# Patient Record
Sex: Female | Born: 1950 | Race: White | Hispanic: No | Marital: Married | Smoking: Never smoker
Health system: Southern US, Community
[De-identification: ages and names within clinical notes are randomized; demographics above are authoritative.]

## PROBLEM LIST (undated history)

## (undated) DIAGNOSIS — S161XXA Strain of muscle, fascia and tendon at neck level, initial encounter: Secondary | ICD-10-CM

## (undated) DIAGNOSIS — F419 Anxiety disorder, unspecified: Secondary | ICD-10-CM

## (undated) DIAGNOSIS — M47812 Spondylosis without myelopathy or radiculopathy, cervical region: Secondary | ICD-10-CM

## (undated) DIAGNOSIS — E785 Hyperlipidemia, unspecified: Secondary | ICD-10-CM

## (undated) DIAGNOSIS — E039 Hypothyroidism, unspecified: Secondary | ICD-10-CM

## (undated) DIAGNOSIS — I7300 Raynaud's syndrome without gangrene: Secondary | ICD-10-CM

## (undated) DIAGNOSIS — J45909 Unspecified asthma, uncomplicated: Secondary | ICD-10-CM

## (undated) DIAGNOSIS — K219 Gastro-esophageal reflux disease without esophagitis: Secondary | ICD-10-CM

## (undated) DIAGNOSIS — L309 Dermatitis, unspecified: Secondary | ICD-10-CM

## (undated) DIAGNOSIS — E041 Nontoxic single thyroid nodule: Secondary | ICD-10-CM

## (undated) DIAGNOSIS — Z8669 Personal history of other diseases of the nervous system and sense organs: Secondary | ICD-10-CM

## (undated) DIAGNOSIS — T4145XA Adverse effect of unspecified anesthetic, initial encounter: Secondary | ICD-10-CM

## (undated) DIAGNOSIS — G610 Guillain-Barre syndrome: Secondary | ICD-10-CM

## (undated) DIAGNOSIS — T7840XA Allergy, unspecified, initial encounter: Secondary | ICD-10-CM

## (undated) DIAGNOSIS — T8859XA Other complications of anesthesia, initial encounter: Secondary | ICD-10-CM

## (undated) DIAGNOSIS — I10 Essential (primary) hypertension: Secondary | ICD-10-CM

## (undated) DIAGNOSIS — M47817 Spondylosis without myelopathy or radiculopathy, lumbosacral region: Secondary | ICD-10-CM

## (undated) DIAGNOSIS — M1990 Unspecified osteoarthritis, unspecified site: Secondary | ICD-10-CM

## (undated) HISTORY — PX: ABDOMINAL HYSTERECTOMY: SHX81

## (undated) HISTORY — PX: KNEE ARTHROSCOPY: SUR90

## (undated) HISTORY — DX: Hyperlipidemia, unspecified: E78.5

## (undated) HISTORY — DX: Hypothyroidism, unspecified: E03.9

## (undated) HISTORY — DX: Allergy, unspecified, initial encounter: T78.40XA

## (undated) HISTORY — DX: Spondylosis without myelopathy or radiculopathy, lumbosacral region: M47.817

## (undated) HISTORY — DX: Spondylosis without myelopathy or radiculopathy, cervical region: M47.812

## (undated) HISTORY — PX: STRABISMUS SURGERY: SHX218

## (undated) HISTORY — PX: HERNIA REPAIR: SHX51

## (undated) HISTORY — PX: FOOT SURGERY: SHX648

## (undated) HISTORY — PX: REPLACEMENT TOTAL KNEE: SUR1224

## (undated) HISTORY — DX: Raynaud's syndrome without gangrene: I73.00

## (undated) HISTORY — PX: COLONOSCOPY: SHX174

## (undated) HISTORY — DX: Nontoxic single thyroid nodule: E04.1

## (undated) HISTORY — DX: Gastro-esophageal reflux disease without esophagitis: K21.9

## (undated) HISTORY — DX: Strain of muscle, fascia and tendon at neck level, initial encounter: S16.1XXA

## (undated) HISTORY — DX: Essential (primary) hypertension: I10

## (undated) HISTORY — PX: REPLACEMENT TOTAL KNEE BILATERAL: SUR1225

## (undated) HISTORY — DX: Unspecified osteoarthritis, unspecified site: M19.90

## (undated) HISTORY — DX: Personal history of other diseases of the nervous system and sense organs: Z86.69

## (undated) HISTORY — PX: TONSILLECTOMY: SUR1361

## (undated) HISTORY — PX: GANGLION CYST EXCISION: SHX1691

## (undated) HISTORY — DX: Guillain-Barre syndrome: G61.0

---

## 1998-10-28 ENCOUNTER — Other Ambulatory Visit: Admission: RE | Admit: 1998-10-28 | Discharge: 1998-10-28 | Payer: Self-pay | Admitting: Obstetrics and Gynecology

## 1999-07-25 ENCOUNTER — Ambulatory Visit (HOSPITAL_COMMUNITY): Admission: RE | Admit: 1999-07-25 | Discharge: 1999-07-25 | Payer: Self-pay | Admitting: Internal Medicine

## 1999-11-23 ENCOUNTER — Other Ambulatory Visit: Admission: RE | Admit: 1999-11-23 | Discharge: 1999-11-23 | Payer: Self-pay | Admitting: Obstetrics and Gynecology

## 2000-12-31 ENCOUNTER — Other Ambulatory Visit: Admission: RE | Admit: 2000-12-31 | Discharge: 2000-12-31 | Payer: Self-pay | Admitting: Obstetrics and Gynecology

## 2002-01-20 ENCOUNTER — Other Ambulatory Visit: Admission: RE | Admit: 2002-01-20 | Discharge: 2002-01-20 | Payer: Self-pay | Admitting: Obstetrics and Gynecology

## 2003-02-10 ENCOUNTER — Other Ambulatory Visit: Admission: RE | Admit: 2003-02-10 | Discharge: 2003-02-10 | Payer: Self-pay | Admitting: Obstetrics and Gynecology

## 2004-12-28 ENCOUNTER — Ambulatory Visit (HOSPITAL_COMMUNITY): Admission: RE | Admit: 2004-12-28 | Discharge: 2004-12-28 | Payer: Self-pay | Admitting: Gastroenterology

## 2005-03-14 ENCOUNTER — Other Ambulatory Visit: Admission: RE | Admit: 2005-03-14 | Discharge: 2005-03-14 | Payer: Self-pay | Admitting: Obstetrics and Gynecology

## 2007-06-20 ENCOUNTER — Ambulatory Visit (HOSPITAL_COMMUNITY): Admission: RE | Admit: 2007-06-20 | Discharge: 2007-06-20 | Payer: Self-pay | Admitting: Rheumatology

## 2007-06-25 ENCOUNTER — Encounter: Admission: RE | Admit: 2007-06-25 | Discharge: 2007-06-25 | Payer: Self-pay | Admitting: Rheumatology

## 2007-11-25 ENCOUNTER — Encounter: Admission: RE | Admit: 2007-11-25 | Discharge: 2007-11-25 | Payer: Self-pay | Admitting: Endocrinology

## 2008-05-14 ENCOUNTER — Encounter: Admission: RE | Admit: 2008-05-14 | Discharge: 2008-05-14 | Payer: Self-pay | Admitting: Endocrinology

## 2008-08-17 ENCOUNTER — Inpatient Hospital Stay (HOSPITAL_COMMUNITY): Admission: RE | Admit: 2008-08-17 | Discharge: 2008-08-20 | Payer: Self-pay | Admitting: Orthopedic Surgery

## 2008-10-26 ENCOUNTER — Ambulatory Visit (HOSPITAL_COMMUNITY): Admission: RE | Admit: 2008-10-26 | Discharge: 2008-10-26 | Payer: Self-pay | Admitting: Orthopedic Surgery

## 2008-11-11 ENCOUNTER — Encounter: Admission: RE | Admit: 2008-11-11 | Discharge: 2008-11-11 | Payer: Self-pay | Admitting: Endocrinology

## 2010-01-12 ENCOUNTER — Inpatient Hospital Stay (HOSPITAL_COMMUNITY): Admission: EM | Admit: 2010-01-12 | Discharge: 2010-01-18 | Payer: Self-pay | Admitting: Emergency Medicine

## 2010-01-14 ENCOUNTER — Encounter (INDEPENDENT_AMBULATORY_CARE_PROVIDER_SITE_OTHER): Payer: Self-pay | Admitting: Neurology

## 2010-01-17 ENCOUNTER — Ambulatory Visit: Payer: Self-pay | Admitting: Physical Medicine & Rehabilitation

## 2010-01-18 ENCOUNTER — Inpatient Hospital Stay (HOSPITAL_COMMUNITY)
Admission: RE | Admit: 2010-01-18 | Discharge: 2010-01-29 | Payer: Self-pay | Admitting: Physical Medicine & Rehabilitation

## 2010-01-22 ENCOUNTER — Ambulatory Visit: Payer: Self-pay | Admitting: Physical Medicine & Rehabilitation

## 2010-03-03 ENCOUNTER — Encounter
Admission: RE | Admit: 2010-03-03 | Discharge: 2010-06-01 | Payer: Self-pay | Admitting: Physical Medicine & Rehabilitation

## 2010-03-04 ENCOUNTER — Ambulatory Visit: Payer: Self-pay | Admitting: Physical Medicine & Rehabilitation

## 2010-03-15 ENCOUNTER — Encounter
Admission: RE | Admit: 2010-03-15 | Discharge: 2010-05-23 | Payer: Self-pay | Admitting: Physical Medicine & Rehabilitation

## 2010-04-19 ENCOUNTER — Ambulatory Visit: Payer: Self-pay | Admitting: Physical Medicine & Rehabilitation

## 2010-07-06 ENCOUNTER — Encounter
Admission: RE | Admit: 2010-07-06 | Discharge: 2010-07-12 | Payer: Self-pay | Source: Home / Self Care | Attending: Physical Medicine & Rehabilitation | Admitting: Physical Medicine & Rehabilitation

## 2010-07-12 ENCOUNTER — Ambulatory Visit: Payer: Self-pay | Admitting: Physical Medicine & Rehabilitation

## 2010-08-11 ENCOUNTER — Encounter: Admission: RE | Admit: 2010-08-11 | Discharge: 2010-08-11 | Payer: Self-pay | Admitting: Endocrinology

## 2010-10-03 ENCOUNTER — Encounter
Admission: RE | Admit: 2010-10-03 | Discharge: 2010-10-11 | Payer: Self-pay | Source: Home / Self Care | Attending: Physical Medicine & Rehabilitation | Admitting: Physical Medicine & Rehabilitation

## 2010-10-11 ENCOUNTER — Ambulatory Visit: Payer: Self-pay | Admitting: Physical Medicine & Rehabilitation

## 2010-11-22 ENCOUNTER — Other Ambulatory Visit: Payer: Self-pay | Admitting: Dermatology

## 2011-01-04 LAB — URINALYSIS, ROUTINE W REFLEX MICROSCOPIC
Bilirubin Urine: NEGATIVE
Ketones, ur: NEGATIVE mg/dL
Specific Gravity, Urine: 1.025 (ref 1.005–1.030)
Urobilinogen, UA: 0.2 mg/dL (ref 0.0–1.0)

## 2011-01-04 LAB — PROTEIN AND GLUCOSE, CSF: Glucose, CSF: 67 mg/dL (ref 43–76)

## 2011-01-04 LAB — COMPREHENSIVE METABOLIC PANEL
Albumin: 3 g/dL — ABNORMAL LOW (ref 3.5–5.2)
BUN: 22 mg/dL (ref 6–23)
Creatinine, Ser: 0.56 mg/dL (ref 0.4–1.2)
GFR calc Af Amer: >60 mL/min (ref >60)
Total Protein: 7.5 g/dL (ref 6.0–8.3)

## 2011-01-04 LAB — ANGIOTENSIN CONVERTING ENZYME, CSF: Angio Convert Enzyme: <3 U/L (ref <16)

## 2011-01-04 LAB — CBC
HCT: 40.1 % (ref 36.0–46.0)
HCT: 42.4 % (ref 36.0–46.0)
HCT: 43.2 % (ref 36.0–46.0)
Hemoglobin: 14.4 g/dL (ref 12.0–15.0)
MCHC: 34.2 g/dL (ref 30.0–36.0)
MCV: 97 fL (ref 78.0–100.0)
MCV: 97.3 fL (ref 78.0–100.0)
MCV: 99.3 fL (ref 78.0–100.0)
Platelets: 121 10*3/uL — ABNORMAL LOW (ref 150–400)
Platelets: 131 10*3/uL — ABNORMAL LOW (ref 150–400)
Platelets: 147 10*3/uL — ABNORMAL LOW (ref 150–400)
RDW: 13.3 % (ref 11.5–15.5)
RDW: 14.2 % (ref 11.5–15.5)
WBC: 3.8 10*3/uL — ABNORMAL LOW (ref 4.0–10.5)

## 2011-01-04 LAB — DIFFERENTIAL
Eosinophils Absolute: 0.1 10*3/uL (ref 0.0–0.7)
Eosinophils Relative: 3 % (ref 0–5)
Lymphocytes Relative: 39 % (ref 12–46)
Lymphs Abs: 1.3 10*3/uL (ref 0.7–4.0)
Lymphs Abs: 2 10*3/uL (ref 0.7–4.0)
Monocytes Absolute: 0.5 10*3/uL (ref 0.1–1.0)
Monocytes Relative: 14 % — ABNORMAL HIGH (ref 3–12)
Neutro Abs: 1.5 10*3/uL — ABNORMAL LOW (ref 1.7–7.7)

## 2011-01-04 LAB — BASIC METABOLIC PANEL
BUN: 21 mg/dL (ref 6–23)
CO2: 28 mEq/L (ref 19–32)
CO2: 31 mEq/L (ref 19–32)
Calcium: 9.4 mg/dL (ref 8.4–10.5)
Chloride: 102 mEq/L (ref 96–112)
Creatinine, Ser: 0.58 mg/dL (ref 0.4–1.2)
GFR calc Af Amer: >60 mL/min (ref >60)
GFR calc non Af Amer: >60 mL/min (ref >60)
Glucose, Bld: 112 mg/dL — ABNORMAL HIGH (ref 70–99)
Glucose, Bld: 136 mg/dL — ABNORMAL HIGH (ref 70–99)
Potassium: 4.1 mEq/L (ref 3.5–5.1)
Sodium: 135 mEq/L (ref 135–145)

## 2011-01-04 LAB — URINE MICROSCOPIC-ADD ON

## 2011-01-04 LAB — CSF CELL COUNT WITH DIFFERENTIAL
RBC Count, CSF: 2 /mm3 — ABNORMAL HIGH
WBC, CSF: 2 /mm3 (ref 0–5)

## 2011-01-04 LAB — URINE CULTURE: Colony Count: >=100000

## 2011-01-04 LAB — EPSTEIN-BARR VIRUS VCA ANTIBODY PANEL: EBV EA IgG: 2.42 {ISR} — ABNORMAL HIGH

## 2011-01-04 LAB — HEPATIC FUNCTION PANEL
Bilirubin, Direct: 0.2 mg/dL (ref 0.0–0.3)
Total Bilirubin: 0.5 mg/dL (ref 0.3–1.2)

## 2011-01-09 LAB — CBC
HCT: 45.5 % (ref 36.0–46.0)
Hemoglobin: 15.4 g/dL — ABNORMAL HIGH (ref 12.0–15.0)
MCHC: 33.8 g/dL (ref 30.0–36.0)
RDW: 14 % (ref 11.5–15.5)

## 2011-01-09 LAB — HIV ANTIBODY (ROUTINE TESTING W REFLEX): HIV: NONREACTIVE

## 2011-01-09 LAB — DIFFERENTIAL
Basophils Absolute: 0.1 10*3/uL (ref 0.0–0.1)
Eosinophils Absolute: 0.1 10*3/uL (ref 0.0–0.7)
Lymphocytes Relative: 37 % (ref 12–46)
Lymphs Abs: 2.3 10*3/uL (ref 0.7–4.0)
Neutrophils Relative %: 47 % (ref 43–77)

## 2011-01-09 LAB — SEDIMENTATION RATE: Sed Rate: 2 mm/hr (ref 0–22)

## 2011-01-09 LAB — HEAVY METALS SCREEN, URINE
Mercury, 24H Ur: <2 mcg/L (ref <21)
Time-HMSU: 24 hours

## 2011-01-09 LAB — COMPREHENSIVE METABOLIC PANEL
ALT: 79 U/L — ABNORMAL HIGH (ref 0–35)
CO2: 27 mEq/L (ref 19–32)
Calcium: 9.8 mg/dL (ref 8.4–10.5)
Creatinine, Ser: 0.71 mg/dL (ref 0.4–1.2)
GFR calc non Af Amer: >60 mL/min (ref >60)
Glucose, Bld: 109 mg/dL — ABNORMAL HIGH (ref 70–99)

## 2011-01-09 LAB — APTT: aPTT: 35 seconds (ref 24–37)

## 2011-01-30 LAB — HEMOGLOBIN AND HEMATOCRIT, BLOOD: Hemoglobin: 14.6 g/dL (ref 12.0–15.0)

## 2011-02-28 NOTE — Op Note (Signed)
NAMETAELER, WINNING            ACCOUNT NO.:  [id number]   MEDICAL RECORD NO.:  [id number]          PATIENT TYPE:  AMB   LOCATION:  DAY                          FACILITY:  [facility]   PHYSICIAN:  Ollen Gross, M.D.    DATE OF BIRTH:  Apr 26, 1951   DATE OF PROCEDURE:  10/26/2008  DATE OF DISCHARGE:                               OPERATIVE REPORT   PREOPERATIVE DIAGNOSIS:  Right knee arthrofibrosis.   POSTOPERATIVE DIAGNOSIS:  Right knee arthrofibrosis.   PROCEDURE:  Right knee closed manipulation.   SURGEON:  Ollen Gross, M.D.   ANESTHESIA:  General.   Pre-manipulation range of motion, 15- 90.  Post-manipulation range of  motion 5-135.   COMPLICATIONS:  None.   CONDITION:  Stable to recovery.   BRIEF CLINICAL NOTE:  Brianna Gates is a 60 year old female who had right  total knee arthroplasty performed approximately 8 weeks ago.  She has  not progressed well with therapy for the past month.  She is stuck at 90  degrees of flexion and has a 15-degree flexion contracture.  She  presents now for closed manipulation.   PROCEDURE IN DETAIL:  After successful administration of general  anesthetic, exam under anesthesia was performed, and range of motion is  15-90.  I then placed my chest on her proximal tibia, flexing the knee  with audible lysis of adhesions.  I was easily able to get her to 135  degrees of flexion.  I then manipulated extension and got it to within 5  degrees of full extension.  I mobilized the patella and the patella was  moving better.   She is subsequently awakened, and transferred to the recovery room in  stable condition.      Ollen Gross, M.D.  Electronically Signed     FA/MEDQ  D:  10/26/2008  T:  10/26/2008  Job:  [id number]

## 2011-02-28 NOTE — Op Note (Signed)
Brianna Gates, Brianna Gates            ACCOUNT NO.:  [id number]   MEDICAL RECORD NO.:  [id number]          PATIENT TYPE:  INP   LOCATION:  NA                           FACILITY:  [facility]   PHYSICIAN:  Ollen Gross, M.D.    DATE OF BIRTH:  02/07/1951   DATE OF PROCEDURE:  08/17/2008  DATE OF DISCHARGE:                               OPERATIVE REPORT   PREOPERATIVE DIAGNOSIS:  Osteoarthritis, right knee.   POSTOPERATIVE DIAGNOSIS:  Osteoarthritis, right knee.   PROCEDURE:  Right total knee arthroplasty.   ASSISTANT:  Arlyn Leak, PA-C   ANESTHESIA:  Spinal with Duramorph.   ESTIMATED BLOOD LOSS:  Minimal.   DRAINS:  None.   TOURNIQUET TIME:  32 minutes at 300 mmHg.   COMPLICATIONS:  None.   CONDITION:  Stable to recovery.   CLINICAL NOTE:  Ms. Axe is a 60 year old female with end-stage  patellofemoral arthritis of the right knee who has had progressively  worsening pain and dysfunction.  She has failed nonoperative management  and presents now for a right total knee arthroplasty.   PROCEDURE IN DETAIL:  After successful administration of spinal  anesthetic, a tourniquet was placed high on her right thigh and right  lower extremity was prepped and draped in the usual sterile fashion.  The extremity was wrapped in Esmarch, knee flexed and tourniquet  inflated to 300 mmHg.   A midline incision was made with a 10 blade through subcutaneous tissue  to the level of the extensor mechanism.  A fresh blade was used to make  a medial parapatellar arthrotomy.  The soft tissue over the proximal  medial tibia subperiosteally elevated to the joint line with the knife  and into the semimembranosus bursa with a Cobb elevator.  The soft  tissue laterally was elevated with attention being paid to avoid  patellar tendon on tibial tubercle.  The patella subluxed laterally.  The knee flexed 90 degrees.  The ACL and PCL were removed.  A drill was  used to create a starting hole in the distal  femur and the canal was  thoroughly irrigated.  The 5 degree right valgus alignment guide was  placed and referencing off the posterior condyle, rotations marked and  the block pinned to remove 11 mm of the distal femur.  I took 11 because  of preop flexion contracture.  The distal femoral resection was made  with an oscillating saw.  Sizing blocks were placed and size 2.5 was the  most appropriate.  The rotation was marked off the epicondylar axis and  size 2.5 cutting block was placed.  The anterior-posterior chamfer cuts  were made.   The tibia was subluxed forward and menisci removed.  An extramedullary  tibial alignment guide was placed, referencing proximally at the medial  aspect of the tibial tubercle and distally along the second metatarsal  axis and tibial crest.  The block was pinned to remove 10 mm off the  nondeficient lateral side.  Tibial resection was made with an  oscillating saw.  A size 2.5 was the most appropriate tibial component  and the proximal tibia was repaired  with a modular drill and keel punch  for a size 2.5.  Femoral preparation was completed with the  intercondylar cut.   A size 2.5 mobile bearing tibial trial, 2.5 posterior stabilized femoral  trial and a 10 mm posterior stabilized rotating platform insert trial  were placed.  The 10 full extensions achieved with excellent varus-  valgus and anterior and posterior balance throughout the full range of  motion.  The patella was everted and thickness measured to be 20 mm.  A  freehand resection was taken at 12 mm, 35 template was placed, lug holes  were drilled, trial patella was placed and it tracked normally.  Osteophytes were removed off the posterior femur with the trial in  place.  All trials were removed and the cut bone surfaces were prepared  with pulsatile lavage.  Cement was mixed and once ready for  implantation, the size 2.5 mobile bearing tibia, 2.5 posterior  stabilized femur and 35 patella  were cemented into place and the patella  was held with a clamp.  A trial 10-mm insert was placed, knee held in  full extension and all extruded cement removed.  When the cement was  fully hardened, then the permanent 10 mm posterior stabilized rotating  platform insert was placed in the tibial tray.   The wound was copiously irrigated with saline solution.  FloSeal was  then injected on the posterior capsule, mediolateral gutters and  suprapatellar area.  A moist sponge was placed and tourniquet released  for a total time of 32 minutes.  The sponge was held for 2 minutes and  then removed.  Minimal bleeding was encountered.  The bleeding that was  encountered was stopped with electrocautery.  The wound was again  irrigated with saline solution and the arthrotomy closed with  interrupted #1 PDS.  Flexion against gravity is 135 degrees.  The  subcutaneous was closed with interrupted 2-0 Vicryl and subcuticular  running 4-0 Monocryl.  The incision was cleaned and dried and Steri-  Strips and a bulky sterile dressing applied.   She was then awakened and transported to recovery in stable condition.      Ollen Gross, M.D.  Electronically Signed     FA/MEDQ  D:  08/17/2008  T:  08/17/2008  Job:  [id number]

## 2011-03-03 NOTE — Op Note (Signed)
NAMESHANERA, MESKE            ACCOUNT NO.:  [id number]   MEDICAL RECORD NO.:  [id number]          PATIENT TYPE:  AMB   LOCATION:  ENDO                         FACILITY:  [facility]   PHYSICIAN:  Danise Edge, M.D.   DATE OF BIRTH:  [birth year]   DATE OF PROCEDURE:  12/28/2004  DATE OF DISCHARGE:                                 OPERATIVE REPORT   PROCEDURE:  Screening colonoscopy.   PROCEDURE INDICATION:  Ms. Brianna Gates is a 60 year old female born  April 25, 1951.  Ms. Brianna Gates is scheduled to undergo her first screening  colonoscopy with polypectomy to prevent colon cancer.   ENDOSCOPIST:  Danise Edge, M.D.   PREMEDICATION:  Versed 6 mg, Demerol 60 mg.   PROCEDURE:  After obtaining informed consent, Ms. Brianna Gates was placed in the  left lateral decubitus position.  I administered intravenous Demerol and  intravenous Versed to achieve conscious sedation for the procedure.  The  patient's blood pressure, oxygen saturation and cardiac rhythm were  monitored throughout the procedure and documented in the medical record.   Anal inspection and digital rectal exam were normal.  The Olympus adjustable  pediatric colonoscope was introduced into the rectum and advanced to the  cecum.  Colonic preparation for the exam today was excellent.   Rectum normal.  Sigmoid colon and descending colon normal.  Splenic flexure normal.  Transverse colon normal.  Hepatic flexure normal.  Ascending colon normal.  Cecum and ileocecal valve normal.  Distal ileum normal.   ASSESSMENT:  Normal screening proctocolonoscopy.  No endoscopic evidence for  the presence of colorectal neoplasia.      MJ/MEDQ  D:  12/28/2004  T:  12/28/2004  Job:  [id number]   cc:   Georgann Housekeeper, MD  [address]  [place]  [place] [address]  Fax: [phone]

## 2011-03-03 NOTE — H&P (Signed)
Brianna Gates, KNUPP            ACCOUNT NO.:  [id number]   MEDICAL RECORD NO.:  [id number]          PATIENT TYPE:  INP   LOCATION:                               FACILITY:  [facility]   PHYSICIAN:  Ollen Gross, M.D.    DATE OF BIRTH:  1951-07-05   DATE OF ADMISSION:  08/17/2008  DATE OF DISCHARGE:                              HISTORY & PHYSICAL   CHIEF COMPLAINT:  Bilateral knee pain.   HISTORY OF PRESENT ILLNESS:  The patient is a 60 year old female with  bilateral knee pain, right is significantly worse than left.  She has  pain with ambulation and range of motion.  The patient has failed  conservative treatment.  The x-rays revealed significant arthritic  changes in her right knee.  The patient has elected to proceed with a  right total knee arthroplasty.   ALLERGIES:  DAYPRO.   CURRENT MEDICATIONS:  1. Synthroid 50 mcg a day.  2. Zyrtec 10 mg a day.  3. Zocor 80 mg a day.  4. Aspirin 81 mg 3 times a week.  5. Amlodipine 5 mg a day.  6. Cozaar 50 mg p.o. q. every other day.  7. EstroGel once a day.  8. Allergy shots with [facility].  9. Multivitamins.  10.Synthroid 50 mcg a day.  11.Hydrochlorothiazide 12.5 mg p.r.n.  12.Celebrex 200 mg once a day.   PAST MEDICAL HISTORY:  1. Hypertension.  2. Hypercholesterolemia.  3. Raynaud's syndrome.  4. Thyroid disease.  5. History of allergic rhinosinusitis.  6. Chronic lower back and cervical pain with lumbar and cervical disk      disease.  7. History of microscopic hematuria since her early 89s.   REVIEW OF SYSTEMS:  Negative for any neurologic issues other than  history of strabismus with eye surgery.  PULMONARY:  She has had  bronchitis and pneumonia in the past but nothing recently.  CARDIOVASCULAR:  Unremarkable other than some hypertension and  cholesterol which seems to be well controlled.  Last stress test was 4  years previous by Dr. Tresa Endo.  GI is unremarkable.  GU is unremarkable.  Endocrine is positive for  thyroid nodules on current thyroid  medications.  Hematologic is unremarkable.   PAST SURGICAL HISTORY:  1. Partial hysterectomy.  2. Tonsillectomy.  3. Adenoidectomy.  4. Eye surgery for strabismus.  5. Right knee arthroscopy.  Only complication was nausea and vomiting postop.   FAMILY MEDICAL HISTORY:  Father is deceased at the age 8 from a heart  attack.  Mother is alive at 19 with hypertension.   SOCIAL HISTORY:  The patient is married, never smoked, has a couple of  alcoholic beverages a week.  Has 2 grown children and lives in a single  family home.   PHYSICAL EXAM:  VITALS:  Height is five 5 feet, 4-1/2 inches tall,  weight is 128 and blood pressure is 130/72, pulse is 68 and regular,  respirations of 12, patient is afebrile.  GENERAL:  This is a healthy-appearing female, conscious, alert and  appropriate, appears to be a good historian.  Appears to be in no  extreme  distress.  Ambulates with a fairly easy balanced gait.  HEENT: Head was normocephalic.  Pupils equal, round, react.  Gross  hearing is intact.  NECK:  Supple.  No palpable lymphadenopathy.  Good range of motion.  CHEST:  Lung sounds were clear and equal bilaterally.  HEART:  Regular rate and rhythm.  No murmurs, rubs or gallops.  ABDOMEN:  Soft, nontender.  Bowel sounds present.  EXTREMITIES:  Upper extremities had good range of motion of shoulders,  elbows and wrists.  LOWER EXTREMITIES:  Both hips had full extension, flexion up to 120  degrees, 30 degrees internal-external rotation.  With both knees, very  gingerly, she was able to fully extend them.  She had significant  crepitus on the patella.  She can flex them back to about 120 degrees.  She had no gross instability.  Calves were soft, good motion at the  ankles.  Peripheral vascular carotid pulses were 2+,  no bruits.  Radial pulses  2+ dorsalis pedis pulses 1+.  She had no lower extremity edema, venous  stasis changes or signs of phlebitis.   NEURO:  The patient was conscious, alert and appropriate, appears to be  a good historian.  No gross neurologic defects.  Breast, rectal and GU exams were deferred at this time.   IMPRESSION:  1. Osteoarthritis with painful knees bilaterally, right greater than      left.  2. Hypertension.  3. Hypercholesterolemia.  4. Raynaud's syndrome.  5. Thyroid disease  6. History of microscopic hematuria.  7. Lumbar and cervical pain with degenerative disk disease.  8. History of chronic allergic rhinitis.   PLAN:  The patient will undergo all routine labs and tests prior to  having a right total knee arthroplasty by Dr. Lequita Halt at [facility] on August 17, 2008.      Jamelle Rushing, P.A.      Ollen Gross, M.D.  Electronically Signed    RWK/MEDQ  D:  07/27/2008  T:  07/27/2008  Job:  [id number]   cc:   Ollen Gross, M.D.  Fax: [phone]

## 2011-03-03 NOTE — Discharge Summary (Signed)
NAMEBELLA, Brianna Gates            ACCOUNT NO.:  [id number]   MEDICAL RECORD NO.:  [id number]          PATIENT TYPE:  INP   LOCATION:  1614                         FACILITY:  [facility]   PHYSICIAN:  Ollen Gross, M.D.    DATE OF BIRTH:  01-29-1951   DATE OF ADMISSION:  08/17/2008  DATE OF DISCHARGE:  08/20/2008                               DISCHARGE SUMMARY   ADMITTING DIAGNOSES:  1. Osteoarthritis bilateral knees.  2. Hypertension.  3. Hypercholesterolemia.  4. Raynaud's syndrome.  5. Thyroid disease.  6. History of microscopic hematuria.  7. Lumbar and cervical degenerative disk disease with pain.  8. History of chronic allergic rhinitis.   DISCHARGE DIAGNOSES:  1. Osteoarthritis right knee, status post right total knee replacement      arthroplasty.  2. Osteoarthritis left knee.  3. Hypertension.  4. Hypercholesterolemia.  5. Raynaud's syndrome.  6. Thyroid disease.  7. History of microscopic hematuria.  8. Lumbar and cervical degenerative disk disease with pain.  9. History of chronic allergic rhinitis.   PROCEDURE:  July 17, 2008 - right total knee.  Surgeon - Dr. Lequita Halt.  Oneida Alar PA-C assisting.  Surgery done under spinal with Duramorph  added.   CONSULTATIONS:  None.   BRIEF HISTORY:  Brianna Gates is a 60 year old female with end-stage  patellofemoral arthritis of the right knee, progressive worsening pain  and dysfunction, who failed outpatient management and now presents for  total knee arthroplasty.   LABORATORY DATA:  Preoperative CBC - hemoglobin of 15.6, hematocrit of  46.7, white cell count 5.3, platelets 169.  Postoperative hemoglobin  13.5.  Last noted hemoglobin and hematocrit of 12.4 and 37.2.  PT/PTT  preoperatively 13.3 and 29 respectively.  INR 1.0.  Serial pro times  followed per Coumadin protocol.  Last noted PT/INR of 24.8 and 2.1.  Chem panel on admission all within normal limits.  Serial BMETs were  followed.  Electrolytes remained within  normal limits.  Preoperative UA  - trace leukocyte esterase, rare epithelials, 0-2 white cells.  Otherwise negative.  Blood group type A+.   ELECTROCARDIOGRAM:  EKG on August 17, 2008 - marked sinus bradycardia,  unconfirmed.   CHEST X-RAY:  Two-view chest on August 13, 2008 - no active  cardiopulmonary process.   HOSPITAL COURSE:  The patient was admitted to [facility] and  tolerated the procedure well.  Later transferred to the recovery room  and orthopedic floor.  Given 24 hours of postoperative IV antibiotics.  Started on Coumadin for DVT prophylaxis.  She did have a fair amount of  sickness and nausea postoperatively.  Was doing a little bit better by  the morning of day one.  Changed the PCA medication.  Output was low, so  we gave fluids.  Also fluids to supplement the nausea and vomiting.  Blood pressure was a little on the lower side, so the blood pressure  medications were restarted but with parameters in mind.  Again, fluid  boluses for the low pressure.  Electrolytes looked good.  Started  getting up out of bed by day #2, doing a little bit better.  Nausea had  resolved.  Electrolytes okay.  Output was much better.  Blood pressure  was still on the lower side, but she was asymptomatic with this.  Urine  output was good, so we just monitored the pressure.  Hemoglobin was  stable, though.  From a therapy standpoint, she started getting up  walking up and down the hallways.  She was meeting her goals, and by the  following day, postoperative day #3, she was walking greater than 50 to  75 feet, tolerating her medications and was discharged home.   DISCHARGE PLAN:  1. The patient was discharged home on August 20, 2008.  2. Discharge diagnoses - please see above.  3. Discharge medications - Vicodin, Robaxin, Coumadin.  4. Diet - low-sodium, heart-healthy, low-cholesterol diet.  5. Follow up in 2 weeks.  She is also to follow up with her medical      physician in  the next week or two for a blood pressure check      because her blood pressure medications were held on parameters.  6. Activity - Weightbearing as tolerated, total knee protocol.   DISPOSITION:  Home.   CONDITION ON DISCHARGE:  Improved.      Alexzandrew L. Perkins, P.A.C.      Ollen Gross, M.D.  Electronically Signed    ALP/MEDQ  D:  09/28/2008  T:  09/28/2008  Job:  [id number]

## 2011-03-29 ENCOUNTER — Ambulatory Visit: Payer: Self-pay | Admitting: Physical Medicine & Rehabilitation

## 2011-04-07 ENCOUNTER — Encounter: Payer: 59 | Attending: Physical Medicine & Rehabilitation | Admitting: Physical Medicine & Rehabilitation

## 2011-04-07 DIAGNOSIS — M1710 Unilateral primary osteoarthritis, unspecified knee: Secondary | ICD-10-CM

## 2011-04-07 DIAGNOSIS — G610 Guillain-Barre syndrome: Secondary | ICD-10-CM | POA: Insufficient documentation

## 2011-04-07 DIAGNOSIS — Z96659 Presence of unspecified artificial knee joint: Secondary | ICD-10-CM | POA: Insufficient documentation

## 2011-04-07 DIAGNOSIS — M545 Low back pain, unspecified: Secondary | ICD-10-CM

## 2011-04-07 DIAGNOSIS — R209 Unspecified disturbances of skin sensation: Secondary | ICD-10-CM | POA: Insufficient documentation

## 2011-04-08 NOTE — Assessment & Plan Note (Signed)
Brianna Gates is back regarding Guillain-Barre syndrome.  I last saw her in December.  She is doing quite well.  She is still having some paresthesias in her legs and hands.  The Neurontin has helped her and she does notice if she misses a dose that her symptoms are bit worse. She notes activity tends to exacerbate symptoms in her hands.  These symptoms usually improve when she stops the activity.  She has had some problems with left wrist including a small fracture apparently after a fall and some ligament damage apparently.  She also had a ganglion cyst drained.  Her right knee gives her some problem still to due to its lack of range of motion.  REVIEW OF SYSTEMS:  Notable for the above.  Full 12-point review is in the written health and history section of the chart.  SOCIAL HISTORY:  Unchanged.  She is married, living with her husband. The patient is active at gym work out, pool, etc.  PHYSICAL EXAMINATION:  VITAL SIGNS:  Blood pressure 133/69, pulse 64, respiratory rate 18, and she is saturating 98% on room air. GENERAL:  The patient is pleasant and alert. MUSCULOSKELETAL:  She tends to have decreased knee flexion on the right. She walks both with the normal and narrow stride width today without any issues.  She has some distal sensory diminishment in the hands and legs. Strength is 5/5. HEART:  Regular. CHEST:  Clear. ABDOMEN:  Soft and nontender.  ASSESSMENT: 1. Guillain-Barre syndrome. 2. History of right total knee replacements with loss of knee flexion.  PLAN: 1. I recommended activity modification as she may have double-crush     phenomenon with overlying carpal tunnel symptoms in her hands.  If     symptoms should progressively increase, she will requires some     nerve conduction testing. 2. I instructed to continue with gabapentin 400 mg 4 times a day. 3. She will use hydrocodone for breakthrough pain half to one daily     p.r.n. 4. Recommended melatonin for sleep at  night to optimize her rest and     improve physical stamina.  She is still having some generalized     fatigue, which is likely multifactorial.  I suspect her sleep is     the biggest factor at this time. 5. Overall, she has done magnificently well.  I had nothing further     offer her today.  She can follow up with her     family doctor regarding her basic medical issues.  She will call me     if any problems or questions or if she is to return for nerve     compression testing, etc.     Ranelle Oyster, M.D. Electronically Signed    ZTS/MedQ D:  04/07/2011 10:10:33  T:  04/08/2011 00:24:46  Job #:  [id number]  cc:   Georgann Housekeeper, MD Fax: [phone]

## 2011-07-18 LAB — PROTIME-INR
Prothrombin Time: 13.3
Prothrombin Time: 14.3
Prothrombin Time: 24.8 — ABNORMAL HIGH

## 2011-07-18 LAB — ABO/RH: ABO/RH(D): A POS

## 2011-07-18 LAB — CBC
HCT: 37.2
HCT: 39.1
HCT: 40.5
Hemoglobin: 15.6 — ABNORMAL HIGH
Hemoglobin: 13.3
MCHC: 33.3
MCHC: 33.3
MCHC: 33.4
MCHC: 33.9
MCV: 101 — ABNORMAL HIGH
MCV: 101.1 — ABNORMAL HIGH
MCV: 102.1 — ABNORMAL HIGH
Platelets: 132 — ABNORMAL LOW
Platelets: 136 — ABNORMAL LOW
RBC: 4.66
RBC: 4.01
RDW: 12.7
RDW: 12.8
WBC: 5.3
WBC: 9.4

## 2011-07-18 LAB — COMPREHENSIVE METABOLIC PANEL
ALT: 24
AST: 26
Alkaline Phosphatase: 60
CO2: 31
Chloride: 106
GFR calc non Af Amer: >60
Glucose, Bld: 93
Potassium: 4.7
Sodium: 142
Total Bilirubin: 0.9

## 2011-07-18 LAB — BASIC METABOLIC PANEL
BUN: 5 — ABNORMAL LOW
BUN: 9
CO2: 30
CO2: 31
Chloride: 102
Creatinine, Ser: 0.63
GFR calc Af Amer: >60
Glucose, Bld: 117 — ABNORMAL HIGH
Potassium: 3.7
Potassium: 3.9
Sodium: 140

## 2011-07-18 LAB — URINALYSIS, ROUTINE W REFLEX MICROSCOPIC
Glucose, UA: NEGATIVE
Hgb urine dipstick: NEGATIVE
Ketones, ur: NEGATIVE
Protein, ur: NEGATIVE
pH: 6.5

## 2011-07-18 LAB — TYPE AND SCREEN: ABO/RH(D): A POS

## 2011-07-26 ENCOUNTER — Other Ambulatory Visit (HOSPITAL_COMMUNITY): Payer: Self-pay | Admitting: Orthopedic Surgery

## 2011-07-26 ENCOUNTER — Other Ambulatory Visit: Payer: Self-pay | Admitting: Orthopedic Surgery

## 2011-07-26 ENCOUNTER — Encounter (HOSPITAL_COMMUNITY): Payer: 59

## 2011-07-26 ENCOUNTER — Ambulatory Visit (HOSPITAL_COMMUNITY)
Admission: RE | Admit: 2011-07-26 | Discharge: 2011-07-26 | Disposition: A | Discharge status: Home / Self Care | Payer: 59 | Source: Ambulatory Visit | Attending: Orthopedic Surgery | Admitting: Orthopedic Surgery

## 2011-07-26 DIAGNOSIS — Z01812 Encounter for preprocedural laboratory examination: Secondary | ICD-10-CM | POA: Insufficient documentation

## 2011-07-26 DIAGNOSIS — Z01818 Encounter for other preprocedural examination: Secondary | ICD-10-CM | POA: Insufficient documentation

## 2011-07-26 DIAGNOSIS — Z01811 Encounter for preprocedural respiratory examination: Secondary | ICD-10-CM

## 2011-07-26 LAB — COMPREHENSIVE METABOLIC PANEL
ALT: 22 U/L (ref 0–35)
AST: 27 U/L (ref 0–37)
Alkaline Phosphatase: 83 U/L (ref 39–117)
CO2: 33 mEq/L — ABNORMAL HIGH (ref 19–32)
Calcium: 9.9 mg/dL (ref 8.4–10.5)
Potassium: 4 mEq/L (ref 3.5–5.1)
Sodium: 140 mEq/L (ref 135–145)
Total Protein: 7 g/dL (ref 6.0–8.3)

## 2011-07-26 LAB — CBC
MCH: 32.2 pg (ref 26.0–34.0)
MCHC: 32.2 g/dL (ref 30.0–36.0)
MCV: 100.2 fL — ABNORMAL HIGH (ref 78.0–100.0)
Platelets: 181 10*3/uL (ref 150–400)
RBC: 4.56 MIL/uL (ref 3.87–5.11)
RDW: 13.2 % (ref 11.5–15.5)

## 2011-07-26 LAB — URINE MICROSCOPIC-ADD ON

## 2011-07-26 LAB — URINALYSIS, ROUTINE W REFLEX MICROSCOPIC
Glucose, UA: NEGATIVE mg/dL
Ketones, ur: NEGATIVE mg/dL
Leukocytes, UA: NEGATIVE
pH: 6.5 (ref 5.0–8.0)

## 2011-07-26 LAB — PROTIME-INR: Prothrombin Time: 12.2 seconds (ref 11.6–15.2)

## 2011-07-31 ENCOUNTER — Inpatient Hospital Stay (HOSPITAL_COMMUNITY)
Admission: RE | Admit: 2011-07-31 | Discharge: 2011-08-03 | DRG: 470 | Disposition: A | Discharge status: Home Health | Payer: 59 | Source: Ambulatory Visit | Attending: Orthopedic Surgery | Admitting: Orthopedic Surgery

## 2011-07-31 DIAGNOSIS — M1710 Unilateral primary osteoarthritis, unspecified knee: Principal | ICD-10-CM | POA: Diagnosis present

## 2011-07-31 DIAGNOSIS — R232 Flushing: Secondary | ICD-10-CM | POA: Diagnosis not present

## 2011-07-31 DIAGNOSIS — I10 Essential (primary) hypertension: Secondary | ICD-10-CM | POA: Diagnosis present

## 2011-07-31 DIAGNOSIS — Z8701 Personal history of pneumonia (recurrent): Secondary | ICD-10-CM

## 2011-07-31 DIAGNOSIS — Z01818 Encounter for other preprocedural examination: Secondary | ICD-10-CM

## 2011-07-31 DIAGNOSIS — E041 Nontoxic single thyroid nodule: Secondary | ICD-10-CM | POA: Diagnosis present

## 2011-07-31 DIAGNOSIS — Z01812 Encounter for preprocedural laboratory examination: Secondary | ICD-10-CM

## 2011-07-31 DIAGNOSIS — D696 Thrombocytopenia, unspecified: Secondary | ICD-10-CM | POA: Diagnosis not present

## 2011-07-31 DIAGNOSIS — T40605A Adverse effect of unspecified narcotics, initial encounter: Secondary | ICD-10-CM | POA: Diagnosis not present

## 2011-07-31 DIAGNOSIS — L259 Unspecified contact dermatitis, unspecified cause: Secondary | ICD-10-CM | POA: Diagnosis present

## 2011-07-31 DIAGNOSIS — E7800 Pure hypercholesterolemia, unspecified: Secondary | ICD-10-CM | POA: Diagnosis present

## 2011-07-31 DIAGNOSIS — G610 Guillain-Barre syndrome: Secondary | ICD-10-CM | POA: Diagnosis present

## 2011-07-31 DIAGNOSIS — D62 Acute posthemorrhagic anemia: Secondary | ICD-10-CM | POA: Diagnosis not present

## 2011-07-31 DIAGNOSIS — IMO0002 Reserved for concepts with insufficient information to code with codable children: Secondary | ICD-10-CM | POA: Diagnosis present

## 2011-07-31 LAB — TYPE AND SCREEN
ABO/RH(D): A POS
Antibody Screen: NEGATIVE

## 2011-08-01 LAB — BASIC METABOLIC PANEL
CO2: 28 mEq/L (ref 19–32)
Chloride: 104 mEq/L (ref 96–112)
Creatinine, Ser: 0.5 mg/dL (ref 0.50–1.10)
GFR calc non Af Amer: >90 mL/min (ref >90)
Potassium: 4 mEq/L (ref 3.5–5.1)
Sodium: 139 mEq/L (ref 135–145)

## 2011-08-01 LAB — CBC
Platelets: 155 10*3/uL (ref 150–400)
RBC: 3.63 MIL/uL — ABNORMAL LOW (ref 3.87–5.11)
WBC: 9.1 10*3/uL (ref 4.0–10.5)

## 2011-08-02 LAB — BASIC METABOLIC PANEL
BUN: 9 mg/dL (ref 6–23)
CO2: 31 mEq/L (ref 19–32)
Chloride: 102 mEq/L (ref 96–112)
Creatinine, Ser: 0.65 mg/dL (ref 0.50–1.10)

## 2011-08-02 LAB — CBC
HCT: 33.9 % — ABNORMAL LOW (ref 36.0–46.0)
Hemoglobin: 11.5 g/dL — ABNORMAL LOW (ref 12.0–15.0)
MCV: 98.3 fL (ref 78.0–100.0)
Platelets: 142 10*3/uL — ABNORMAL LOW (ref 150–400)
RBC: 3.45 MIL/uL — ABNORMAL LOW (ref 3.87–5.11)
WBC: 8.2 10*3/uL (ref 4.0–10.5)

## 2011-08-03 LAB — BASIC METABOLIC PANEL
BUN: 6 mg/dL (ref 6–23)
Chloride: 100 mEq/L (ref 96–112)
Creatinine, Ser: 0.61 mg/dL (ref 0.50–1.10)
GFR calc Af Amer: >90 mL/min (ref >90)

## 2011-08-03 LAB — CBC
HCT: 33.5 % — ABNORMAL LOW (ref 36.0–46.0)
MCV: 99.1 fL (ref 78.0–100.0)
RDW: 13.4 % (ref 11.5–15.5)
WBC: 8.4 10*3/uL (ref 4.0–10.5)

## 2011-08-09 NOTE — H&P (Addendum)
Brianna Gates, CANNY NO.:  [id number]  MEDICAL RECORD NO.:  [id number]  LOCATION:                                 FACILITY:  PHYSICIAN:  Ollen Gross, M.D.    DATE OF BIRTH:  10-19-1950  DATE OF ADMISSION:  07/31/2011 DATE OF DISCHARGE:                             HISTORY & PHYSICAL   CHIEF COMPLAINT:  Left knee pain.  HISTORY OF PRESENT ILLNESS:  Patient is a 60 year old female who has been seen by Dr. Lequita Halt for ongoing left knee pain.  Brianna Gates has had previous right total knee and doing well with the right knee, but the left knee continues to be problematic.  Brianna Gates has advanced arthritis which has progressively gotten worse.  It is interfering with her activities. It is felt Brianna Gates would benefit undergoing surgical intervention.  Risks and benefits have been discussed.  Brianna Gates would like to proceed with surgery.  There are no contraindications such as ongoing infection or progressive neurological disease.  CURRENT MEDICATIONS:  Synthroid, Celebrex, simvastatin, Cozaar, gabapentin, vitamin D, vitamin A, EstroGel, Zyrtec.  ALLERGIES: 1. ALLERGY SHOTS. 2. ASPIRIN. 3. AMLODIPINE. 4. DAYPRO.  PAST MEDICAL HISTORY: 1. 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 2. History of bronchitis. 3. History of pneumonia. 4. Hypertension. 5. Hypercholesterolemia. 6. Thyroid nodular disease. 7. Degenerative disk disease. 8. Childhood illnesses, measles, mumps. 9. Also, eczema.  PAST SURGICAL HISTORY: 1. Right total knee replacement. 2. Right eye surgery.  FAMILY HISTORY:  Father deceased, age 45 with heart failure.  Mother living, age 70.  SOCIAL HISTORY:  Married.  Nonsmoker. 1 to 2 glasses of wine a day.  Brianna Gates does have a caregiver lined up.  REVIEW OF SYSTEMS:  GENERAL:  No fever, chills, night sweats.  NEURO: No seizures, syncope, or paralysis. Brianna Gates does have some balance issues though due to history of Guillain-Barre.  RESPIRATORY:  No  shortness of breath, productive cough, or hemoptysis.  CARDIOVASCULAR:  No chest pain or orthopnea.  GI:  No nausea, vomiting, diarrhea, or constipation.  GU: No dysuria, hematuria , or discharge.  MUSCULOSKELETAL:  Knee pain.  PHYSICAL EXAMINATION:  VITAL SIGNS:  Pulse 64, respirations 12, blood pressure 128/78. GENERAL:  A 60 year old white female, well-nourished, well-developed, no acute distress.  Thin frame, alert, oriented, cooperative, slightly anxious, good historian. HEENT:  Normocephalic, atraumatic.  Pupils are round and reactive.  Does wear reading glasses. NECK:  Supple.  No bruits. CHEST:  Clear anterior and posterior chest walls.  No rhonchi, rales, or wheezing.  HEART:  Regular rate and rhythm without murmur,  S1, S2 noted. ABDOMEN:  Soft, flat, nontender.  Bowel sounds present. RECTAL/BREASTS/GENITALIA:  Not done, not part of present illness. EXTREMITIES:  Left knee, moderate crepitus.  Range of motion 5 to 120. No instability.  IMPRESSION:  Osteoarthritis, left knee.  PLAN:  The patient will be admitted to [facility] to undergo a left total knee replacement arthroplasty.  Surgery will be performed by Dr. Ollen Gross.     Alexzandrew L. Julien Girt, P.A.C.   ______________________________ Ollen Gross, M.D.    ALP/MEDQ  D:  07/30/2011  T:  07/31/2011  Job:  [id number]  cc:   Jerelyn Scott  Donette Larry, MD Fax: [phone]  Electronically Signed by Patrica Duel P.A.C. on 08/05/2011 09:18:22 AM Electronically Signed by Ollen Gross M.D. on 08/09/2011 11:27:49 AM Electronically Signed by Ollen Gross M.D. on 08/09/2011 11:34:15 AM

## 2011-08-09 NOTE — Discharge Summary (Addendum)
NAMEMARINDA, Brianna Gates NO.:  [id number]  MEDICAL RECORD NO.:  [id number]  LOCATION:  1603                         FACILITY:  [facility]  PHYSICIAN:  Brianna Gates, M.D.    DATE OF BIRTH:  1951/04/07  DATE OF ADMISSION:  07/31/2011 DATE OF DISCHARGE:  08/03/2011                              DISCHARGE SUMMARY   ADMITTING DIAGNOSES: 1. Osteoarthritis, left knee. 2. 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 3. History of bronchitis. 4. History of pneumonia. 5. Hypertension. 6. Hypercholesterolemia. 7. Thyroid nodular disease. 8. Degenerative disk disease. 9. Eczema. 10.Childhood illnesses of measles and mumps.  DISCHARGE DIAGNOSES: 1. Osteoarthritis, left knee; status post left total knee replacement     arthroplasty. 2. Mild postoperative acute blood loss anemia. 3. 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 4. History of bronchitis. 5. History of pneumonia. 6. Hypertension. 7. Hypercholesterolemia. 8. Thyroid nodular disease. 9. Degenerative disk disease. 10.Eczema. 11.Childhood illnesses of measles and mumps. 12.Mild postoperative thrombocytopenia.  PROCEDURE:  July 31, 2011, left total knee.  SURGEON:  Brianna Gates, M.D.  ASSISTANT:  Brianna Gates, P.A.C.  ANESTHESIA:  General.  TOURNIQUET TIME:  35 minutes.  CONSULTATION:  None.  BRIEF HISTORY:  Brianna Gates is a 60 year old female with advanced patellofemoral lateral compartment arthritis of the left knee, progressive worsening pain and dysfunction.  She has failed nonoperative management including injections, cortisone, and viscous supplementation, now presents for total knee arthroplasty.  LABORATORY DATA:  Preop CBC showed hemoglobin of 14.7, hematocrit 45.7, white cell count 5.1, and platelets 181.  PT/INR preop 12.2 and 0.89 with a PTT of 31. Chem panel on admission, minimally elevated CO2 of 33, slightly low total bili of  0.2.  Remaining Chem panel within normal limits.  Preop UA showed trace blood, few squamous, 0-2 white cells, and 0-2 red cells.  Blood group type A positive.  Nasal swabs were negative. Staph aureus negative for MRSA.  Serial CBCs were followed:  Hemoglobin dropped down to 11.7 and 11.5.  Last night H and H 10.8 and 33.5. Serial BMETs followed for 72 hours.  Electrolytes remained within normal limits.  Glucose went up from 92 to 151 back down to 111.  EKG dated July 04, 2011, sinus rhythm, normal P axis, normal sinus.  HOSPITAL COURSE:  The patient admitted to [facility], taken to the operating room, underwent above-stated procedure without complication.  The patient tolerated the procedure well, later transferred to recovery room in orthopedic floor, started on p.o. and IV analgesic pain control following surgery.  Doing pretty well on the morning of day 1, did have some pain.  She is given 24 hours postop IV antibiotics.  Started on Xarelto for DVT prophylaxis.  Hemovac drain placed on the surgery was pulled without difficulty.  She wanted to go home, so we got discharge planning involved.  Started getting up out of bed on day 1.  She actually did extremely well walking over 200-300 feet.  By day 2, she had some pain through the night and it is because she did so much therapy on day 1.  She had a little bit of flush with oxycodone, so we changed her meds  over to Dilaudid.  Dressing changed on day 2 and incision looked good.  She had a little bit more pain though we watched her and she actually did again well on postop day 2 and by day 3, she was tolerating her meds, making progress with therapy and she was discharged home that day.  DISCHARGE PLAN: 1. The patient was discharged home on August 03, 2011. 2. Discharge diagnoses:  Please see above. 3. Discharge meds:  Dilaudid, Robaxin, and Xarelto.  Continue home     meds of Celebrex, gabapentin, losartan, simvastatin,  Synthroid,     Systane eyedrops, and Zyrtec.  DIET:  Heart-healthy diet.  ACTIVITY:  Weightbearing as tolerated.  Total knee protocol.  Home health PT equipment.  FOLLOWUP:  2 weeks.  DISPOSITION:  Home.  CONDITION ON DISCHARGE:  Improved.     Brianna Gates, P.A.C.   ______________________________ Brianna Gates, M.D.    ALP/MEDQ  D:  08/03/2011  T:  08/03/2011  Job:  [id number]  cc:   Georgann Housekeeper, MD Fax: [phone]  Electronically Signed by Patrica Duel P.A.C. on 08/05/2011 09:18:29 AM Electronically Signed by Brianna Gates M.D. on 08/09/2011 11:27:58 AM Electronically Signed by Brianna Gates M.D. on 08/09/2011 11:35:02 AM

## 2011-08-09 NOTE — Op Note (Addendum)
Brianna Gates, Brianna Gates NO.:  [id number]  MEDICAL RECORD NO.:  [id number]  LOCATION:  1603                         FACILITY:  [facility]  PHYSICIAN:  Ollen Gross, M.D.    DATE OF BIRTH:  10-Aug-1951  DATE OF PROCEDURE:  07/31/2011 DATE OF DISCHARGE:                              OPERATIVE REPORT   PREOPERATIVE DIAGNOSIS:  Osteoarthritis, left knee.  POSTOPERATIVE DIAGNOSIS:  Osteoarthritis, left knee.  PROCEDURE:  Left total knee arthroplasty.  SURGEON:  Ollen Gross, M.D.  ASSISTANT:  Alexzandrew L. Perkins, P.A.C.  ANESTHESIA:  General.  ESTIMATED BLOOD LOSS:  Minimal.  DRAINS:  Hemovac x1.  TOURNIQUET TIME:  35 minutes at 300 mmHg.  COMPLICATIONS:  None.  CONDITION:  Stable to recovery.  BRIEF CLINICAL NOTE:  Brianna Gates is a 59 year old female who has advanced patellofemoral and lateral compartment arthritis of the left knee with progressively worsening pain and dysfunction.  She has failed nonoperative management including injections of cortisone and viscosupplement and presents  now for total knee arthroplasty.  PROCEDURE IN DETAIL:  After successful administration of general anesthetic, a tourniquet was placed high on her left thigh and her left lower extremity were prepped and draped in the usual sterile fashion. Extremities wrapped in Esmarch, knee flexed, tourniquet inflated to 300 mmHg.  Midline incision was made with a 10 blade through the subcutaneous tissue to the level of the extensor mechanism.  A fresh blade was used to make a medial parapatellar arthrotomy.  Soft tissue on the proximal medial tibia was subperiosteally elevated to the joint line with a knife and into the semimembranosus bursa with a Cobb elevator. Soft tissue was subperiosteally elevated with attention being paid to avoiding the patellar tendon on tibial tubercle.  The patella was everted, knee flexed 90 degrees, and ACL and PCL removed.  Drill was used to create a  starting hole in the distal femur and the canal was thoroughly irrigated.  The 5 degree left valgus alignment guide was placed and the distal femoral cutting block was pinned to remove 11 mm off the distal femur.  Resection was made with an oscillating saw.  The tibia was subluxed forward and the menisci were removed.  The extramedullary tibial alignment guide was placed referencing proximally at the medial aspect of the tibial tubercle and distally along the 2nd metatarsal axis and tibial crest.  The block was pinned to remove 2 mm off the more deficient lateral side.  Tibial resection was made with an oscillating saw.  Size 2.5 was the most appropriate tibial component and the proximal tibia was prepared with a modular drill and keel punch for the size 2.5.  Femoral sizing guide was placed, size 3 was most appropriate.  Rotations marked at the epicondylar axis and confirmed by creating a rectangular flexion gap at 90 degrees.  The block was pinned in this rotation and the anterior-posterior and chamfer cuts made.  Intercondylar block was placed and that cut was made.  Trial size 3 posterior stabilized femur was placed.  A 10 mm posterior stabilized rotating platform insert trial was placed.  With the 10, full extension was achieved with excellent varus-valgus and anterior-posterior balance throughout full range of motion.  Patella was everted and the thickness measured to be 22 mm. Freehand resection was taken to 12 mm, 35 template was placed, lug holes were drilled, trial patella was placed, and it tracks normally. Osteophytes removed off the posterior femur with the trial in place. All trials were removed and the cut bone surfaces prepared with pulsatile lavage.  Cement was mixed and once ready for implantation, the size 2.5 mobile bearing tibial tray, size 3 posterior stabilized femur, and 35 patella were cemented into place, the patella was held with a clamp.  Trial 10 mm inserts  placed, knee held in full extension, all extruded cement removed.  When the cement fully hardened, then the permanent 10 mm posterior stabilized rotating platform insert was placed into the tibial tray.  The wound was copiously irrigated with saline solution and the arthrotomy closed over Hemovac drain with interrupted #1 PDS.  Flexion against gravity was 140 degrees, the patella tracks normally.  The tourniquet was released with a total time of 35 minutes. Subcu was then closed with interrupted 2-0 Vicryl and subcuticular running 4-0 Monocryl.  The catheter for the Marcaine pain pump was placed and pumps initiated.  Steri-Strips and a bulky sterile dressing were applied.  She was then placed into a knee immobilizer, awakened, and transported to recovery in stable condition.  Please note that there were bone-on-bone changes in the lateral and patellofemoral compartments.  Also, note that there was a medical necessity for a surgical assistant in this procedure to help perform it in a safe and expeditious manner.  Surgical assistant was necessary for retraction of ligaments and vital neurovascular structures as well as proper positioning in the limb to allow for anatomic placement of components.     Ollen Gross, M.D.     FA/MEDQ  D:  07/31/2011  T:  07/31/2011  Job:  [id number]  Electronically Signed by Ollen Gross M.D. on 08/09/2011 11:27:55 AM Electronically Signed by Ollen Gross M.D. on 08/09/2011 11:34:52 AM

## 2012-05-03 ENCOUNTER — Other Ambulatory Visit: Payer: Self-pay | Admitting: Neurology

## 2012-05-03 DIAGNOSIS — M47812 Spondylosis without myelopathy or radiculopathy, cervical region: Secondary | ICD-10-CM

## 2012-05-07 ENCOUNTER — Ambulatory Visit
Admission: RE | Admit: 2012-05-07 | Discharge: 2012-05-07 | Disposition: A | Discharge status: Home / Self Care | Payer: 59 | Source: Ambulatory Visit | Attending: Neurology | Admitting: Neurology

## 2012-05-07 VITALS — BP 150/63 | HR 64

## 2012-05-07 DIAGNOSIS — M47812 Spondylosis without myelopathy or radiculopathy, cervical region: Secondary | ICD-10-CM

## 2012-05-07 MED ORDER — TRIAMCINOLONE ACETONIDE 40 MG/ML IJ SUSP (RADIOLOGY)
60.0000 mg | Freq: Once | INTRAMUSCULAR | Status: AC
  Administered 2012-05-07: 60 mg via EPIDURAL

## 2012-05-07 MED ORDER — IOHEXOL 300 MG/ML  SOLN
1.0000 mL | Freq: Once | INTRAMUSCULAR | Status: AC | PRN
  Administered 2012-05-07: 1 mL via INTRAVENOUS

## 2012-08-22 DIAGNOSIS — E039 Hypothyroidism, unspecified: Secondary | ICD-10-CM | POA: Insufficient documentation

## 2012-08-22 DIAGNOSIS — Z96659 Presence of unspecified artificial knee joint: Secondary | ICD-10-CM | POA: Insufficient documentation

## 2012-08-22 DIAGNOSIS — R269 Unspecified abnormalities of gait and mobility: Secondary | ICD-10-CM | POA: Insufficient documentation

## 2012-08-22 DIAGNOSIS — M1712 Unilateral primary osteoarthritis, left knee: Secondary | ICD-10-CM | POA: Insufficient documentation

## 2012-08-22 DIAGNOSIS — I10 Essential (primary) hypertension: Secondary | ICD-10-CM | POA: Insufficient documentation

## 2012-08-22 DIAGNOSIS — M6281 Muscle weakness (generalized): Secondary | ICD-10-CM | POA: Insufficient documentation

## 2012-08-22 DIAGNOSIS — H509 Unspecified strabismus: Secondary | ICD-10-CM | POA: Insufficient documentation

## 2012-08-22 DIAGNOSIS — M47812 Spondylosis without myelopathy or radiculopathy, cervical region: Secondary | ICD-10-CM | POA: Insufficient documentation

## 2012-08-22 DIAGNOSIS — I7300 Raynaud's syndrome without gangrene: Secondary | ICD-10-CM | POA: Insufficient documentation

## 2012-08-22 DIAGNOSIS — G610 Guillain-Barre syndrome: Secondary | ICD-10-CM | POA: Insufficient documentation

## 2012-12-07 ENCOUNTER — Encounter: Payer: Self-pay | Admitting: Neurology

## 2012-12-07 DIAGNOSIS — H509 Unspecified strabismus: Secondary | ICD-10-CM

## 2012-12-07 DIAGNOSIS — I10 Essential (primary) hypertension: Secondary | ICD-10-CM

## 2012-12-07 DIAGNOSIS — R269 Unspecified abnormalities of gait and mobility: Secondary | ICD-10-CM

## 2012-12-07 DIAGNOSIS — M47812 Spondylosis without myelopathy or radiculopathy, cervical region: Secondary | ICD-10-CM

## 2012-12-07 DIAGNOSIS — M1712 Unilateral primary osteoarthritis, left knee: Secondary | ICD-10-CM

## 2012-12-07 DIAGNOSIS — E039 Hypothyroidism, unspecified: Secondary | ICD-10-CM

## 2012-12-07 DIAGNOSIS — M6281 Muscle weakness (generalized): Secondary | ICD-10-CM

## 2012-12-07 DIAGNOSIS — Z96659 Presence of unspecified artificial knee joint: Secondary | ICD-10-CM

## 2012-12-07 DIAGNOSIS — G610 Guillain-Barre syndrome: Secondary | ICD-10-CM

## 2012-12-07 DIAGNOSIS — I7300 Raynaud's syndrome without gangrene: Secondary | ICD-10-CM

## 2013-01-23 ENCOUNTER — Encounter: Payer: Self-pay | Admitting: Neurology

## 2013-01-23 ENCOUNTER — Ambulatory Visit (INDEPENDENT_AMBULATORY_CARE_PROVIDER_SITE_OTHER): Payer: 59 | Admitting: Neurology

## 2013-01-23 VITALS — BP 111/62 | HR 62 | Ht 65.0 in | Wt 126.0 lb

## 2013-01-23 DIAGNOSIS — M47812 Spondylosis without myelopathy or radiculopathy, cervical region: Secondary | ICD-10-CM

## 2013-01-23 DIAGNOSIS — G610 Guillain-Barre syndrome: Secondary | ICD-10-CM

## 2013-01-23 DIAGNOSIS — R269 Unspecified abnormalities of gait and mobility: Secondary | ICD-10-CM

## 2013-01-23 NOTE — Progress Notes (Signed)
Reason for visit: Neuromuscular pain  Brianna Gates is an 62 y.o. female  History of present illness:  Ms. Kettlewell is a 62 year old right-handed white female with a history of Guillain-Barr syndrome in the past. The patient mainly at this point has some residual of numbness and tingling sensations in the feet, and some mild gait instability. Her major complaint at this point is arthritic pain that involves the neck, low back, and knees. The patient is on Celebrex taking 200 mg twice daily. The patient is on gabapentin taking 400 mg 3 times daily. The patient has had facet joint injections in the back, but she continues to have significant discomfort and stiffness in the morning, and it may take 2 hours to alleviate the pain after she gets up out of bed. The patient takes hydrocodone in the morning as well. The patient may have return of pain in the afternoon. The patient returns to this office for an evaluation.  Past Medical History  Diagnosis Date  . Guillain-Barre syndrome   . Hypertension   . Dyslipidemia   . Degenerative arthritis   . Hypothyroidism   . Thyroid nodule   . Cervical spondylosis   . Lumbosacral spondylosis   . Raynaud disease     Past Surgical History  Procedure Laterality Date  . Abdominal hysterectomy    . Tonsillectomy    . Strabismus surgery Right   . Replacement total knee bilateral Bilateral   . Ganglion cyst excision Left     Wrist    Family History  Problem Relation Age of Onset  . Hypertension Mother   . Other Mother     Dyslipidemia  . Heart disease Father   . Other Brother     Dyslipidemia    Social history:  reports that she has never smoked. She does not have any smokeless tobacco history on file. She reports that she drinks about 0.5 ounces of alcohol per week. She reports that she does not use illicit drugs.  Allergies:  Allergies  Allergen Reactions  . Altace (Ramipril) Cough  . Captopril   . Daypro (Oxaprozin) Hives  .  Zoloft (Sertraline Hcl) Diarrhea    Medications:  Current Outpatient Prescriptions on File Prior to Visit  Medication Sig Dispense Refill  . acetaminophen (TYLENOL) 325 MG tablet Take 650 mg by mouth every 6 (six) hours as needed for pain.      Marland Kitchen amLODipine (NORVASC) 5 MG tablet Take 5 mg by mouth daily. Take as needed      . aspirin 81 MG tablet Take 81 mg by mouth daily.      . celecoxib (CELEBREX) 200 MG capsule Take 200 mg by mouth 2 (two) times daily.      . cetirizine (ZYRTEC ALLERGY) 10 MG tablet Take 10 mg by mouth daily.      . Cholecalciferol (VITAMIN D) 2000 UNITS CAPS Take 2,000 Units by mouth 2 (two) times daily.      . Estradiol 0.75 MG/1.25 GM (0.06%) topical gel Place 1.25 g onto the skin daily.      Marland Kitchen gabapentin (NEURONTIN) 400 MG capsule Take 400 mg by mouth 3 (three) times daily.      Marland Kitchen HYDROcodone-acetaminophen (NORCO) 10-325 MG per tablet Take 1 tablet by mouth every 6 (six) hours as needed for pain.      Marland Kitchen ibuprofen (ADVIL,MOTRIN) 200 MG tablet Take 200 mg by mouth every 6 (six) hours as needed for pain.      Marland Kitchen levothyroxine (  SYNTHROID) 50 MCG tablet Take 50 mcg by mouth daily.      Marland Kitchen losartan (COZAAR) 50 MG tablet Take 50 mg by mouth daily.      . simvastatin (ZOCOR) 20 MG tablet Take 20 mg by mouth daily.      . vitamin E ([employer] VITAMIN E) 400 UNIT capsule Take 400 Units by mouth daily.       No current facility-administered medications on file prior to visit.    ROS:  Out of a complete 14 system review of symptoms, the patient complains only of the following symptoms, and all other reviewed systems are negative.  Fatigue Difficulty swallowing Wheezing, snoring Joint pain, joint swelling, aching muscles Allergies, runny nose Numbness, weakness Anxiety, insomnia  Blood pressure 111/62, pulse 62, height 5\' 5"  (1.651 m), weight 126 lb (57.153 kg).  Physical Exam  General: The patient is alert and cooperative at the time of the examination.  Skin: 1+  peripheral edema is noted in the ankles bilaterally.   Neurologic Exam  Cranial nerves: Facial symmetry is present. Speech is normal, no aphasia or dysarthria is noted. Extraocular movements are full. Visual fields are full.  Motor: The patient has good strength in all 4 extremities.  Coordination: The patient has good finger-nose-finger and heel-to-shin bilaterally.  Gait and station: The patient has a normal gait. Tandem gait is normal. Romberg is negative. No drift is seen.  Reflexes: Deep tendon reflexes are symmetric, but are depressed.   Assessment/Plan:  One. History of Guillain-Barr syndrome  2. Degenerative arthritis  3. Mild gait disturbance  The patient is doing relatively well at this point in regards to her neuropathy issues. The patient does have some residual numbness and tingling sensations. She may try slowly tapering off of gabapentin to see if she actually needs this medication. The patient mainly is complaining of arthritis related pain. The patient has multilevel facet joint arthritis in the cervical and lumbosacral spines. The patient has bilateral knee discomfort. The patient is on Celebrex taking 1 tablet twice daily, and she will continue the medication. The patient will otherwise followup in 6-8 months. The patient is to be seen by Dr. Corliss Skains from rheumatology.  Marlan Palau MD 01/23/2013 8:05 PM  [facility] [address] [place], [place] [address]  Phone [phone] Fax [phone]

## 2013-01-28 ENCOUNTER — Other Ambulatory Visit: Payer: Self-pay | Admitting: Endocrinology

## 2013-01-28 DIAGNOSIS — E049 Nontoxic goiter, unspecified: Secondary | ICD-10-CM

## 2013-01-31 ENCOUNTER — Ambulatory Visit
Admission: RE | Admit: 2013-01-31 | Discharge: 2013-01-31 | Disposition: A | Discharge status: Home / Self Care | Payer: 59 | Source: Ambulatory Visit | Attending: Endocrinology | Admitting: Endocrinology

## 2013-01-31 DIAGNOSIS — E049 Nontoxic goiter, unspecified: Secondary | ICD-10-CM

## 2013-07-25 ENCOUNTER — Ambulatory Visit (INDEPENDENT_AMBULATORY_CARE_PROVIDER_SITE_OTHER): Payer: 59 | Admitting: Neurology

## 2013-07-25 ENCOUNTER — Encounter: Payer: Self-pay | Admitting: Neurology

## 2013-07-25 ENCOUNTER — Encounter (INDEPENDENT_AMBULATORY_CARE_PROVIDER_SITE_OTHER): Payer: Self-pay

## 2013-07-25 VITALS — BP 112/61 | HR 61 | Wt 122.0 lb

## 2013-07-25 DIAGNOSIS — G610 Guillain-Barre syndrome: Secondary | ICD-10-CM

## 2013-07-25 DIAGNOSIS — R269 Unspecified abnormalities of gait and mobility: Secondary | ICD-10-CM

## 2013-07-25 NOTE — Progress Notes (Signed)
Reason for visit: History of Guillian Teola Bradley syndrome  Brianna Gates is an 62 y.o. female  History of present illness:  Brianna Gates is a 62 year old right-handed white female with a history of Guillian Barre syndrome, with near-complete recovery. The patient still has some mild gait instability, and some neuropathic discomfort. The patient mainly has issues with degenerative arthritis, and chronic low back pain. The patient currently is seeing Dr. Ethelene Hal for epidural steroid injections. The patient has had significant issues with Raynaud's phenomenon, and she will be going on Plaquenil in the near future. The patient has lost some mobility at the knees, and she will be undergoing surgery on the knees to help eliminate scar tissue that will allow her knees to flex and extend more fully. The patient likely will require ongoing physical therapy over the next several months. The patient was unable to taper off of the gabapentin, and she remains on 400 mg 3 times daily. The patient also takes Celebrex for her arthritis.  Past Medical History  Diagnosis Date  . Guillain-Barre syndrome   . Hypertension   . Dyslipidemia   . Degenerative arthritis   . Hypothyroidism   . Thyroid nodule   . Cervical spondylosis   . Lumbosacral spondylosis   . Raynaud disease   . GERD (gastroesophageal reflux disease)     Past Surgical History  Procedure Laterality Date  . Abdominal hysterectomy    . Tonsillectomy    . Strabismus surgery Right   . Replacement total knee bilateral Bilateral   . Ganglion cyst excision Left     Wrist    Family History  Problem Relation Age of Onset  . Hypertension Mother   . Other Mother     Dyslipidemia  . Heart disease Father   . Other Brother     Dyslipidemia    Social history:  reports that she has never smoked. She has never used smokeless tobacco. She reports that she drinks about 0.5 ounces of alcohol per week. She reports that she does not use illicit  drugs.    Allergies  Allergen Reactions  . Altace [Ramipril] Cough  . Captopril   . Daypro [Oxaprozin] Hives  . Zoloft [Sertraline Hcl] Diarrhea    Medications:  Current Outpatient Prescriptions on File Prior to Visit  Medication Sig Dispense Refill  . acetaminophen (TYLENOL) 325 MG tablet Take 650 mg by mouth every 6 (six) hours as needed for pain.      Marland Kitchen amLODipine (NORVASC) 5 MG tablet Take 5 mg by mouth daily. Take as needed      . aspirin 81 MG tablet Take 81 mg by mouth daily.      . celecoxib (CELEBREX) 200 MG capsule Take 200 mg by mouth daily.       . cetirizine (ZYRTEC ALLERGY) 10 MG tablet Take 10 mg by mouth daily.      . Cholecalciferol (VITAMIN D) 2000 UNITS CAPS Take 2,000 Units by mouth 2 (two) times daily.      . Estradiol 0.75 MG/1.25 GM (0.06%) topical gel Place 1.25 g onto the skin daily.      Marland Kitchen gabapentin (NEURONTIN) 400 MG capsule Take 400 mg by mouth 3 (three) times daily.      Marland Kitchen HYDROcodone-acetaminophen (NORCO) 10-325 MG per tablet Take 1 tablet by mouth every 6 (six) hours as needed for pain.      Marland Kitchen levothyroxine (SYNTHROID) 50 MCG tablet Take 50 mcg by mouth daily.      Marland Kitchen  losartan (COZAAR) 50 MG tablet Take 50 mg by mouth daily.      . simvastatin (ZOCOR) 20 MG tablet Take 20 mg by mouth daily.      . vitamin E ([employer]S VITAMIN E) 400 UNIT capsule Take 400 Units by mouth daily.       No current facility-administered medications on file prior to visit.    ROS:  Out of a complete 14 system review of symptoms, the patient complains only of the following symptoms, and all other reviewed systems are negative.  Weight loss Joint pain, joint swelling, achy muscles Allergies  Blood pressure 112/61, pulse 61, weight 122 lb (55.339 kg).  Physical Exam  General: The patient is alert and cooperative at the time of the examination.  Neuromuscular: The patient is unable to fully extend the knees on either side.  Skin: No significant peripheral edema is  noted.   Neurologic Exam  Cranial nerves: Facial symmetry is present. Speech is normal, no aphasia or dysarthria is noted. Extraocular movements are full. Visual fields are full.  Motor: The patient has good strength in all 4 extremities.  Coordination: The patient has good finger-nose-finger and heel-to-shin bilaterally. The patient is able to walk on heels and the toes.  Gait and station: The patient has a normal gait. Tandem gait is normal. Romberg is negative. No drift is seen.  Reflexes: Deep tendon reflexes are symmetric, but are depressed.   Assessment/Plan:  1. Degenerative arthritis  2. History of Guillain Barre syndrome  The patient is overall doing well from a neurologic standpoint. The patient will continue the Celebrex and gabapentin for now. She will followup in one year.  Brianna Palau MD 07/25/2013 1:03 PM  [facility] [address] [place], [place] [address]  Phone [phone] Fax [phone]

## 2013-08-11 NOTE — Progress Notes (Signed)
Need orders in EPIC.  Surgery scheduled for 08/27/13.  Preop on 08/18/13 at 1000am.

## 2013-08-13 ENCOUNTER — Encounter (HOSPITAL_COMMUNITY): Payer: Self-pay | Admitting: Pharmacy Technician

## 2013-08-14 ENCOUNTER — Other Ambulatory Visit: Payer: Self-pay | Admitting: Orthopedic Surgery

## 2013-08-14 NOTE — Progress Notes (Signed)
Preoperative surgical orders have been place into the Epic hospital system for Brianna Gates on 08/14/2013, 1:03 PM  by Patrica Duel for surgery on 08/27/2013.  Preop Knee orders including IV Tylenol and IV Decadron as long as there are no contraindications to the above medications. Avel Peace, PA-C

## 2013-08-15 ENCOUNTER — Other Ambulatory Visit (HOSPITAL_COMMUNITY): Payer: Self-pay | Admitting: *Deleted

## 2013-08-18 ENCOUNTER — Ambulatory Visit (HOSPITAL_COMMUNITY)
Admission: RE | Admit: 2013-08-18 | Discharge: 2013-08-18 | Disposition: A | Discharge status: Home / Self Care | Payer: 59 | Source: Ambulatory Visit | Attending: Orthopedic Surgery | Admitting: Orthopedic Surgery

## 2013-08-18 ENCOUNTER — Encounter (HOSPITAL_COMMUNITY)
Admission: RE | Admit: 2013-08-18 | Discharge: 2013-08-18 | Disposition: A | Discharge status: Home / Self Care | Payer: 59 | Source: Ambulatory Visit | Attending: Orthopedic Surgery | Admitting: Orthopedic Surgery

## 2013-08-18 ENCOUNTER — Encounter (HOSPITAL_COMMUNITY): Payer: Self-pay

## 2013-08-18 DIAGNOSIS — E785 Hyperlipidemia, unspecified: Secondary | ICD-10-CM | POA: Insufficient documentation

## 2013-08-18 DIAGNOSIS — Z01818 Encounter for other preprocedural examination: Secondary | ICD-10-CM | POA: Insufficient documentation

## 2013-08-18 DIAGNOSIS — I10 Essential (primary) hypertension: Secondary | ICD-10-CM | POA: Insufficient documentation

## 2013-08-18 DIAGNOSIS — Z01810 Encounter for preprocedural cardiovascular examination: Secondary | ICD-10-CM | POA: Insufficient documentation

## 2013-08-18 DIAGNOSIS — J45909 Unspecified asthma, uncomplicated: Secondary | ICD-10-CM | POA: Insufficient documentation

## 2013-08-18 DIAGNOSIS — Z01812 Encounter for preprocedural laboratory examination: Secondary | ICD-10-CM | POA: Insufficient documentation

## 2013-08-18 HISTORY — DX: Other complications of anesthesia, initial encounter: T88.59XA

## 2013-08-18 HISTORY — DX: Anxiety disorder, unspecified: F41.9

## 2013-08-18 HISTORY — DX: Dermatitis, unspecified: L30.9

## 2013-08-18 HISTORY — DX: Unspecified asthma, uncomplicated: J45.909

## 2013-08-18 HISTORY — DX: Adverse effect of unspecified anesthetic, initial encounter: T41.45XA

## 2013-08-18 LAB — CBC
Hemoglobin: 14.9 g/dL (ref 12.0–15.0)
MCH: 31.8 pg (ref 26.0–34.0)
MCHC: 32.2 g/dL (ref 30.0–36.0)
MCV: 98.9 fL (ref 78.0–100.0)
RBC: 4.68 MIL/uL (ref 3.87–5.11)

## 2013-08-18 LAB — BASIC METABOLIC PANEL
BUN: 21 mg/dL (ref 6–23)
CO2: 30 mEq/L (ref 19–32)
Calcium: 9.8 mg/dL (ref 8.4–10.5)
GFR calc non Af Amer: >90 mL/min (ref >90)
Glucose, Bld: 71 mg/dL (ref 70–99)
Potassium: 4.4 mEq/L (ref 3.5–5.1)
Sodium: 140 mEq/L (ref 135–145)

## 2013-08-18 NOTE — Progress Notes (Signed)
08/18/13 1019  OBSTRUCTIVE SLEEP APNEA  Have you ever been diagnosed with sleep apnea through a sleep study? No  Do you snore loudly (loud enough to be heard through closed doors)?  1  Do you often feel tired, fatigued, or sleepy during the daytime? 1  Has anyone observed you stop breathing during your sleep? 0  Do you have, or are you being treated for high blood pressure? 1  BMI more than 35 kg/m2? 0  Age over 63 years old? 1  Neck circumference greater than 40 cm/18 inches? 0  Gender: 0  Obstructive Sleep Apnea Score 4  Score 4 or greater  Results sent to PCP

## 2013-08-18 NOTE — Patient Instructions (Addendum)
LEITHA HYPPOLITE  08/18/2013                           YOUR PROCEDURE IS SCHEDULED ON:  08/27/13               PLEASE REPORT TO SHORT STAY CENTER AT :  10:30 AM               CALL THIS NUMBER IF ANY PROBLEMS THE DAY OF SURGERY :               [phone]                      REMEMBER:   Do not eat food or drink liquids AFTER MIDNIGHT  May have clear liquids UNTIL 6 HOURS BEFORE SURGERY ( 7:30 AM)  Clear liquids include soda, tea, black coffee, apple or grape juice, broth.  Take these medicines the morning of surgery with A SIP OF WATER:  AMLODIPINE / WELLBUTRIN / GABAPENTIN / SYNTHROID / PANTOPRAZOLE / MAY TAKE HYDROCODONE OR TYLENOL IF NEEDED FOR PAIN   Do not wear jewelry, make-up   Do not wear lotions, powders, or perfumes.   Do not shave legs or underarms 12 hrs. before surgery (men may shave face)  Do not bring valuables to the hospital.  Contacts, dentures or bridgework may not be worn into surgery.  Leave suitcase in the car. After surgery it may be brought to your room.  For patients admitted to the hospital more than one night, checkout time is 11:00                          The day of discharge.   Patients discharged the day of surgery will not be allowed to drive home                             If going home same day of surgery, must have someone stay with you first                           24 hrs at home and arrange for some one to drive you home from hospital.    Special Instructions:   Please read over the following fact sheets that you were given:               1. INCENTIVE SPIROMETER                     2. [place] PREPARING FOR SURGERY SHEET              3. STOP ASPIRIN / VITAMINS / ANTI-INFLAMMATORY MEDS 5 DAYS PREOP                                                X_____________________________________________________________________        Failure to follow these instructions may result in cancellation of your surgery

## 2013-08-26 ENCOUNTER — Other Ambulatory Visit: Payer: Self-pay | Admitting: Orthopedic Surgery

## 2013-08-26 NOTE — H&P (Signed)
Brianna Gates  DOB: 06/17/1951 Married / Language: English / Race: White Female  Date of Admission:  08/27/2013  Chief Complaint:  Right Knee Pain and Stiffness  History of Present Illness The patient is a 62 year old female who comes in for a preoperative History and Physical. The patient is scheduled for a right arthrotomy including exploration biopsy and removal loose body to be performed by Dr. Frank V. Aluisio, MD at [facility] on 08/27/2013. The patient is a 62 year old female presenting for a post-operative visit. The patient comes in today 1 year (9 months) out from bilateral total knee arthroplasty (and 4 years 7 1/2 months on the right total knee). The patient states that he/she is doing fair at this time. They are currently on Hydrocodone for their pain. Note for "Post TKA": She is having problems with the knees, especially the right one. She says the range of motion on the right is very tight and her right knee will not fully extend. It only flexes to 90 degrees. Her Rheumatologist was concerned about her extension. Lurlean Sedivy is very conscious about her limited motion of the right knee. The patient states that people talk to her about it all of the time. She has a lot of functional deficits because of it. She has had a total knee and also a manipulation. She exercises regularly but does not feel like she is making any improvement. If anything, she feels like she is getting stiffer. The left knee however is doing better. They have been treated conservatively in the past for the above stated problem and despite conservative measures, they continue to have progressive pain and severe functional limitations and dysfunction. They have failed non-operative management including home exercise, medications. It is felt that they would benefit from undergoing arthrotomy and possible total joint revision. Risks and benefits of the procedure have been discussed with  the patient and they elect to proceed with surgery. There are no active contraindications to surgery such as ongoing infection or rapidly progressive neurological disease.    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 Deceased, Myocardial infarction. Age 49    Social History Marital status. Married. Tobacco use. Never smoker. Alcohol use. Drinks wine.    Medication History Vitamin D (400UNIT Capsule, 1 (one) Oral) Active. Vitamin E (200UNIT Capsule, 1 (one) Oral) Active. Gabapentin ( Oral) Specific dose unknown - Active. Pantoprazole Sodium (40MG Tablet DR, Oral) Active. CeleBREX (200MG Capsule, Oral) Active. Synthroid ( Oral) Specific dose unknown - Active. Losartan Potassium ( Oral) Specific dose unknown - Active. Baby Aspirin ( Oral) Specific dose unknown - Active. Wellbutrin SR (150MG Tablet ER 12HR, Oral) Active. Simvastatin (20MG Tablet, Oral) Active. Norco (10-325MG Tablet, 1 Oral po bid prn pain, Taken starting 07/24/2013) Active. ZyrTEC Allergy ( Oral) Specific dose unknown - Active.    Past Surgical History Right Eye Surgery Total Knee Replacement - Right. Date: 2009. Hysterectomy (not due to cancer) - Partial Tonsillectomy. Adnoids    Medical History GuillainBarr Syndrome Anxiety Disorder Hypertension Hypercholesterolemia Thyroid Nodule Asthma Bronchitis Pneumonia Childhood Illness: Mumps Childhood Illness: Measels Nodular Thyroid Eczema Menopause    Review of Systems General:Present- Night Sweats and Fatigue. Not Present- Chills, Fever, Weight Gain, Weight Loss and Memory Loss. Skin:Not Present- Hives, Itching, Rash, Eczema and Lesions. HEENT:Not Present- Tinnitus, Headache, Double Vision, Visual Loss, Hearing Loss and Dentures. Respiratory:Not Present- Shortness of    breath with exertion, Shortness of breath at rest, Allergies, Coughing up blood and Chronic Cough. Cardiovascular:Not Present- Chest Pain, Racing/skipping heartbeats, Difficulty Breathing Lying Down, Murmur, Swelling and Palpitations. Gastrointestinal:Not Present- Bloody Stool, Heartburn, Abdominal Pain, Vomiting, Nausea, Constipation, Diarrhea, Difficulty Swallowing, Jaundice and Loss of appetitie. Female Genitourinary:Not Present- Blood in Urine, Urinary frequency, Weak urinary stream, Discharge, Flank Pain, Incontinence, Painful Urination, Urgency, Urinary Retention and Urinating at Night. Musculoskeletal:Present- Muscle Pain, Joint Swelling, Joint Pain, Back Pain and Morning Stiffness. Not Present- Muscle Weakness and Spasms. Neurological:Not Present- Tremor, Dizziness, Blackout spells, Paralysis, Difficulty with balance and Weakness. Psychiatric:Not Present- Insomnia.    Vitals Weight: 123 lb Height: 64.5 in Weight was reported by patient. Height was reported by patient. Body Surface Area: 1.59 m Body Mass Index: 20.79 kg/m BP: 122/78 (Sitting, Right Arm, Standard)     Physical Exam The physical exam findings are as follows:   General Mental Status - Alert, cooperative and good historian. General Appearance- pleasant. Not in acute distress. Orientation- Oriented X3. Build & Nutrition- Well nourished and Well developed.   Head and Neck Head- normocephalic, atraumatic . Neck Global Assessment- supple. no bruit auscultated on the right and no bruit auscultated on the left.   Eye Pupil- Bilateral- Regular and Round. Motion- Bilateral- EOMI.   Chest and Lung Exam Auscultation: Breath sounds:- clear at anterior chest wall and - clear at posterior chest wall. Adventitious sounds:- No Adventitious sounds.   Cardiovascular Auscultation:Rhythm- Regular rate and rhythm. Heart Sounds- S1 WNL and S2 WNL. Murmurs & Other Heart  Sounds:Auscultation of the heart reveals - No Murmurs.   Abdomen Palpation/Percussion:Tenderness- Abdomen is non-tender to palpation. Rigidity (guarding)- Abdomen is soft. Auscultation:Auscultation of the abdomen reveals - Bowel sounds normal.   Female Genitourinary Not done, not pertinent to present illness  Musculoskeletal She is alert and oriented in no apparent distress.  Evaluation of the left knee, range of motion about zero to 110 degrees actively, 115 degrees passively. There is no tenderness or instability.  Evaluation of the right knee, range of motion is 10 to 95 degrees actively, close to 100 degrees passively. She does walk very stiff legged.   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 Aluisio.  Plan is to go Home.  PCP - Dr. Karrar Husain  The patient does not have any contraindications and will receive TXA (tranexamic acid) prior to surgery.  Signed electronically by Alexzandrew L Perkins, III PA-C  

## 2013-08-27 ENCOUNTER — Inpatient Hospital Stay (HOSPITAL_COMMUNITY)
Admission: RE | Admit: 2013-08-27 | Discharge: 2013-08-29 | DRG: 468 | Disposition: A | Discharge status: Home Health | Payer: 59 | Source: Ambulatory Visit | Attending: Orthopedic Surgery | Admitting: Orthopedic Surgery

## 2013-08-27 ENCOUNTER — Encounter (HOSPITAL_COMMUNITY): Payer: 59 | Admitting: *Deleted

## 2013-08-27 ENCOUNTER — Encounter (HOSPITAL_COMMUNITY)
Admission: RE | Disposition: A | Discharge status: Home Health | Payer: Self-pay | Source: Ambulatory Visit | Attending: Orthopedic Surgery

## 2013-08-27 ENCOUNTER — Encounter (HOSPITAL_COMMUNITY): Payer: Self-pay | Admitting: *Deleted

## 2013-08-27 ENCOUNTER — Inpatient Hospital Stay (HOSPITAL_COMMUNITY): Payer: 59 | Admitting: *Deleted

## 2013-08-27 DIAGNOSIS — Z96659 Presence of unspecified artificial knee joint: Secondary | ICD-10-CM

## 2013-08-27 DIAGNOSIS — M25669 Stiffness of unspecified knee, not elsewhere classified: Secondary | ICD-10-CM

## 2013-08-27 DIAGNOSIS — Z96651 Presence of right artificial knee joint: Secondary | ICD-10-CM

## 2013-08-27 HISTORY — PX: KNEE ARTHROTOMY: SHX5881

## 2013-08-27 SURGERY — ARTHROTOMY, KNEE
Anesthesia: General | Site: Knee | Laterality: Right | Wound class: Clean

## 2013-08-27 MED ORDER — LACTATED RINGERS IV SOLN
INTRAVENOUS | Status: DC
  Administered 2013-08-27: 1000 mL via INTRAVENOUS
  Administered 2013-08-27: 15:00:00 via INTRAVENOUS

## 2013-08-27 MED ORDER — STERILE WATER FOR IRRIGATION IR SOLN
Status: DC | PRN
  Administered 2013-08-27: 3000 mL

## 2013-08-27 MED ORDER — KETAMINE HCL 10 MG/ML IJ SOLN
INTRAMUSCULAR | Status: DC | PRN
  Administered 2013-08-27 (×2): 25 mg via INTRAVENOUS

## 2013-08-27 MED ORDER — PHENOL 1.4 % MT LIQD: 1.0000 | OROMUCOSAL | Status: DC | PRN

## 2013-08-27 MED ORDER — METOCLOPRAMIDE HCL 5 MG/ML IJ SOLN
INTRAMUSCULAR | Status: DC | PRN
  Administered 2013-08-27: 10 mg via INTRAVENOUS

## 2013-08-27 MED ORDER — DOCUSATE SODIUM 100 MG PO CAPS
100.0000 mg | ORAL_CAPSULE | Freq: Two times a day (BID) | ORAL | Status: DC
  Administered 2013-08-27 – 2013-08-29 (×4): 100 mg via ORAL

## 2013-08-27 MED ORDER — METHOCARBAMOL 100 MG/ML IJ SOLN
500.0000 mg | Freq: Four times a day (QID) | INTRAVENOUS | Status: DC | PRN
  Administered 2013-08-27: 500 mg via INTRAVENOUS
  Filled 2013-08-27: qty 5

## 2013-08-27 MED ORDER — DEXAMETHASONE 6 MG PO TABS
10.0000 mg | ORAL_TABLET | Freq: Every day | ORAL | Status: AC
  Administered 2013-08-28: 10 mg via ORAL
  Filled 2013-08-27: qty 1

## 2013-08-27 MED ORDER — TRAMADOL HCL 50 MG PO TABS: 50.0000 mg | ORAL_TABLET | Freq: Four times a day (QID) | ORAL | Status: DC | PRN

## 2013-08-27 MED ORDER — BUPROPION HCL ER (SR) 150 MG PO TB12
150.0000 mg | ORAL_TABLET | Freq: Every morning | ORAL | Status: DC
  Administered 2013-08-28 – 2013-08-29 (×2): 150 mg via ORAL
  Filled 2013-08-27 (×2): qty 1

## 2013-08-27 MED ORDER — BUPIVACAINE LIPOSOME 1.3 % IJ SUSP
20.0000 mL | Freq: Once | INTRAMUSCULAR | Status: DC
  Filled 2013-08-27: qty 20

## 2013-08-27 MED ORDER — DIPHENHYDRAMINE HCL 12.5 MG/5ML PO ELIX: 12.5000 mg | ORAL_SOLUTION | ORAL | Status: DC | PRN

## 2013-08-27 MED ORDER — POLYETHYLENE GLYCOL 3350 17 G PO PACK: 17.0000 g | PACK | Freq: Every day | ORAL | Status: DC | PRN

## 2013-08-27 MED ORDER — BUPIVACAINE HCL (PF) 0.25 % IJ SOLN
INTRAMUSCULAR | Status: DC | PRN
  Administered 2013-08-27: 20 mL

## 2013-08-27 MED ORDER — SODIUM CHLORIDE 0.9 % IJ SOLN
INTRAMUSCULAR | Status: AC
  Filled 2013-08-27: qty 50

## 2013-08-27 MED ORDER — MENTHOL 3 MG MT LOZG: 1.0000 | LOZENGE | OROMUCOSAL | Status: DC | PRN

## 2013-08-27 MED ORDER — GABAPENTIN 400 MG PO CAPS
400.0000 mg | ORAL_CAPSULE | Freq: Three times a day (TID) | ORAL | Status: DC
  Administered 2013-08-27 – 2013-08-29 (×5): 400 mg via ORAL
  Filled 2013-08-27 (×8): qty 1

## 2013-08-27 MED ORDER — PHENYLEPHRINE HCL 10 MG/ML IJ SOLN
INTRAMUSCULAR | Status: DC | PRN
  Administered 2013-08-27: 80 ug via INTRAVENOUS

## 2013-08-27 MED ORDER — FLEET ENEMA 7-19 GM/118ML RE ENEM: 1.0000 | ENEMA | Freq: Once | RECTAL | Status: AC | PRN

## 2013-08-27 MED ORDER — METOCLOPRAMIDE HCL 10 MG PO TABS: 5.0000 mg | ORAL_TABLET | Freq: Three times a day (TID) | ORAL | Status: DC | PRN

## 2013-08-27 MED ORDER — OXYCODONE HCL 5 MG/5ML PO SOLN
5.0000 mg | Freq: Once | ORAL | Status: DC | PRN
  Filled 2013-08-27: qty 5

## 2013-08-27 MED ORDER — 0.9 % SODIUM CHLORIDE (POUR BTL) OPTIME
TOPICAL | Status: DC | PRN
  Administered 2013-08-27: 1000 mL

## 2013-08-27 MED ORDER — CHLORHEXIDINE GLUCONATE 4 % EX LIQD: 60.0000 mL | Freq: Once | CUTANEOUS | Status: DC

## 2013-08-27 MED ORDER — SODIUM CHLORIDE 0.9 % IJ SOLN
INTRAMUSCULAR | Status: DC | PRN
  Administered 2013-08-27: 30 mL

## 2013-08-27 MED ORDER — ACETAMINOPHEN 650 MG RE SUPP: 650.0000 mg | Freq: Four times a day (QID) | RECTAL | Status: DC | PRN

## 2013-08-27 MED ORDER — MIDAZOLAM HCL 5 MG/5ML IJ SOLN
INTRAMUSCULAR | Status: DC | PRN
  Administered 2013-08-27: 2 mg via INTRAVENOUS

## 2013-08-27 MED ORDER — CEFAZOLIN SODIUM-DEXTROSE 2-3 GM-% IV SOLR
INTRAVENOUS | Status: AC
  Filled 2013-08-27: qty 50

## 2013-08-27 MED ORDER — ACETAMINOPHEN 10 MG/ML IV SOLN
1000.0000 mg | Freq: Once | INTRAVENOUS | Status: AC
  Administered 2013-08-27: 1000 mg via INTRAVENOUS
  Filled 2013-08-27: qty 100

## 2013-08-27 MED ORDER — CEFAZOLIN SODIUM-DEXTROSE 2-3 GM-% IV SOLR
2.0000 g | INTRAVENOUS | Status: AC
  Administered 2013-08-27: 2 g via INTRAVENOUS

## 2013-08-27 MED ORDER — SODIUM CHLORIDE 0.9 % IV SOLN: INTRAVENOUS | Status: DC

## 2013-08-27 MED ORDER — MEPERIDINE HCL 50 MG/ML IJ SOLN: 6.2500 mg | INTRAMUSCULAR | Status: DC | PRN

## 2013-08-27 MED ORDER — DEXAMETHASONE SODIUM PHOSPHATE 4 MG/ML IJ SOLN
INTRAMUSCULAR | Status: DC | PRN
  Administered 2013-08-27: 10 mg via INTRAVENOUS

## 2013-08-27 MED ORDER — FENTANYL CITRATE 0.05 MG/ML IJ SOLN
INTRAMUSCULAR | Status: DC | PRN
  Administered 2013-08-27 (×4): 25 ug via INTRAVENOUS

## 2013-08-27 MED ORDER — ONDANSETRON HCL 4 MG/2ML IJ SOLN: 4.0000 mg | Freq: Four times a day (QID) | INTRAMUSCULAR | Status: DC | PRN

## 2013-08-27 MED ORDER — AMLODIPINE BESYLATE 5 MG PO TABS
5.0000 mg | ORAL_TABLET | Freq: Every day | ORAL | Status: DC | PRN
  Filled 2013-08-27: qty 1

## 2013-08-27 MED ORDER — DEXAMETHASONE SODIUM PHOSPHATE 10 MG/ML IJ SOLN: 10.0000 mg | Freq: Once | INTRAMUSCULAR | Status: DC

## 2013-08-27 MED ORDER — LEVOTHYROXINE SODIUM 50 MCG PO TABS
50.0000 ug | ORAL_TABLET | Freq: Every day | ORAL | Status: DC
  Administered 2013-08-28 – 2013-08-29 (×2): 50 ug via ORAL
  Filled 2013-08-27 (×3): qty 1

## 2013-08-27 MED ORDER — BUPIVACAINE LIPOSOME 1.3 % IJ SUSP
INTRAMUSCULAR | Status: DC | PRN
  Administered 2013-08-27: 20 mL

## 2013-08-27 MED ORDER — HYDROMORPHONE HCL PF 1 MG/ML IJ SOLN
INTRAMUSCULAR | Status: AC
  Filled 2013-08-27: qty 1

## 2013-08-27 MED ORDER — ACETAMINOPHEN 325 MG PO TABS
650.0000 mg | ORAL_TABLET | Freq: Four times a day (QID) | ORAL | Status: DC | PRN
  Administered 2013-08-29 (×2): 650 mg via ORAL
  Filled 2013-08-27 (×2): qty 2

## 2013-08-27 MED ORDER — LORATADINE 10 MG PO TABS
10.0000 mg | ORAL_TABLET | Freq: Every day | ORAL | Status: DC
  Administered 2013-08-27 – 2013-08-29 (×3): 10 mg via ORAL
  Filled 2013-08-27 (×3): qty 1

## 2013-08-27 MED ORDER — RIVAROXABAN 10 MG PO TABS
10.0000 mg | ORAL_TABLET | Freq: Every day | ORAL | Status: DC
  Administered 2013-08-28 – 2013-08-29 (×2): 10 mg via ORAL
  Filled 2013-08-27 (×3): qty 1

## 2013-08-27 MED ORDER — METHOCARBAMOL 500 MG PO TABS
500.0000 mg | ORAL_TABLET | Freq: Four times a day (QID) | ORAL | Status: DC | PRN
  Administered 2013-08-28 – 2013-08-29 (×3): 500 mg via ORAL
  Filled 2013-08-27 (×3): qty 1

## 2013-08-27 MED ORDER — ACETAMINOPHEN 500 MG PO TABS
1000.0000 mg | ORAL_TABLET | Freq: Four times a day (QID) | ORAL | Status: AC
  Administered 2013-08-27 – 2013-08-28 (×2): 1000 mg via ORAL
  Filled 2013-08-27 (×3): qty 2

## 2013-08-27 MED ORDER — TRANEXAMIC ACID 100 MG/ML IV SOLN
1000.0000 mg | INTRAVENOUS | Status: AC
  Administered 2013-08-27: 1000 mg via INTRAVENOUS
  Filled 2013-08-27: qty 10

## 2013-08-27 MED ORDER — LOSARTAN POTASSIUM 50 MG PO TABS
50.0000 mg | ORAL_TABLET | Freq: Every day | ORAL | Status: DC
  Filled 2013-08-27 (×2): qty 1

## 2013-08-27 MED ORDER — GLYCOPYRROLATE 0.2 MG/ML IJ SOLN
INTRAMUSCULAR | Status: DC | PRN
  Administered 2013-08-27: 0.2 mg via INTRAVENOUS

## 2013-08-27 MED ORDER — DEXTROSE-NACL 5-0.9 % IV SOLN
INTRAVENOUS | Status: DC
  Administered 2013-08-27 – 2013-08-28 (×2): via INTRAVENOUS

## 2013-08-27 MED ORDER — EPHEDRINE SULFATE 50 MG/ML IJ SOLN
INTRAMUSCULAR | Status: DC | PRN
  Administered 2013-08-27: 10 mg via INTRAVENOUS

## 2013-08-27 MED ORDER — PROMETHAZINE HCL 25 MG/ML IJ SOLN: 6.2500 mg | INTRAMUSCULAR | Status: DC | PRN

## 2013-08-27 MED ORDER — SODIUM CHLORIDE 0.9 % IR SOLN
Status: DC | PRN
  Administered 2013-08-27: 1000 mL

## 2013-08-27 MED ORDER — METOCLOPRAMIDE HCL 5 MG/ML IJ SOLN: 5.0000 mg | Freq: Three times a day (TID) | INTRAMUSCULAR | Status: DC | PRN

## 2013-08-27 MED ORDER — PROPOFOL 10 MG/ML IV BOLUS
INTRAVENOUS | Status: DC | PRN
  Administered 2013-08-27: 150 mg via INTRAVENOUS

## 2013-08-27 MED ORDER — ONDANSETRON HCL 4 MG PO TABS: 4.0000 mg | ORAL_TABLET | Freq: Four times a day (QID) | ORAL | Status: DC | PRN

## 2013-08-27 MED ORDER — HYDROMORPHONE HCL PF 1 MG/ML IJ SOLN
0.2500 mg | INTRAMUSCULAR | Status: DC | PRN
  Administered 2013-08-27 (×2): 0.5 mg via INTRAVENOUS

## 2013-08-27 MED ORDER — OXYCODONE HCL 5 MG PO TABS: 5.0000 mg | ORAL_TABLET | Freq: Once | ORAL | Status: DC | PRN

## 2013-08-27 MED ORDER — ALBUTEROL SULFATE HFA 108 (90 BASE) MCG/ACT IN AERS: 1.0000 | INHALATION_SPRAY | Freq: Four times a day (QID) | RESPIRATORY_TRACT | Status: DC | PRN

## 2013-08-27 MED ORDER — MORPHINE SULFATE 2 MG/ML IJ SOLN: 1.0000 mg | INTRAMUSCULAR | Status: DC | PRN

## 2013-08-27 MED ORDER — DEXAMETHASONE SODIUM PHOSPHATE 10 MG/ML IJ SOLN
10.0000 mg | Freq: Every day | INTRAMUSCULAR | Status: AC
  Filled 2013-08-27: qty 1

## 2013-08-27 MED ORDER — BUPIVACAINE HCL (PF) 0.25 % IJ SOLN
INTRAMUSCULAR | Status: AC
  Filled 2013-08-27: qty 30

## 2013-08-27 MED ORDER — ONDANSETRON HCL 4 MG/2ML IJ SOLN
INTRAMUSCULAR | Status: DC | PRN
  Administered 2013-08-27: 4 mg via INTRAVENOUS

## 2013-08-27 MED ORDER — PANTOPRAZOLE SODIUM 40 MG PO TBEC
40.0000 mg | DELAYED_RELEASE_TABLET | Freq: Every day | ORAL | Status: DC
Start: 2013-08-28 — End: 2013-08-29
  Administered 2013-08-28 – 2013-08-29 (×2): 40 mg via ORAL
  Filled 2013-08-27 (×2): qty 1

## 2013-08-27 MED ORDER — BISACODYL 10 MG RE SUPP: 10.0000 mg | Freq: Every day | RECTAL | Status: DC | PRN

## 2013-08-27 MED ORDER — SIMVASTATIN 20 MG PO TABS
20.0000 mg | ORAL_TABLET | Freq: Every day | ORAL | Status: DC
  Administered 2013-08-27 – 2013-08-28 (×2): 20 mg via ORAL
  Filled 2013-08-27 (×3): qty 1

## 2013-08-27 MED ORDER — ESTRADIOL 0.75 MG/1.25 GM (0.06%) TD GEL
1.2500 g | Freq: Every day | TRANSDERMAL | Status: DC
  Administered 2013-08-28: 1.25 g via TRANSDERMAL

## 2013-08-27 MED ORDER — OXYCODONE HCL 5 MG PO TABS
5.0000 mg | ORAL_TABLET | ORAL | Status: DC | PRN
  Administered 2013-08-27: 10 mg via ORAL
  Administered 2013-08-27: 5 mg via ORAL
  Administered 2013-08-28 (×5): 10 mg via ORAL
  Administered 2013-08-28: 5 mg via ORAL
  Administered 2013-08-29 (×4): 10 mg via ORAL
  Filled 2013-08-27: qty 1
  Filled 2013-08-27 (×3): qty 2
  Filled 2013-08-27: qty 1
  Filled 2013-08-27 (×7): qty 2

## 2013-08-27 SURGICAL SUPPLY — 42 items
ADAPTER FEMORAL BOLT NEUTRAL (Knees) ×1 IMPLANT
ADPR FEM 5D STRL KN PFC SGM (Orthopedic Implant) ×1 IMPLANT
ADPR FEM NTRL BOLT STRL KN PFC (Knees) ×1 IMPLANT
BAG SPEC THK2 15X12 ZIP CLS (MISCELLANEOUS) ×1
BAG ZIPLOCK 12X15 (MISCELLANEOUS) ×2 IMPLANT
BANDAGE ELASTIC 6 VELCRO ST LF (GAUZE/BANDAGES/DRESSINGS) ×2 IMPLANT
BANDAGE ESMARK 6X9 LF (GAUZE/BANDAGES/DRESSINGS) ×1 IMPLANT
BNDG CMPR 9X6 STRL LF SNTH (GAUZE/BANDAGES/DRESSINGS) ×1
BNDG ESMARK 6X9 LF (GAUZE/BANDAGES/DRESSINGS) ×2
CEMENT HV SMART SET (Cement) ×2 IMPLANT
CLOTH BEACON ORANGE TIMEOUT ST (SAFETY) ×2 IMPLANT
CUFF TOURN SGL QUICK 34 (TOURNIQUET CUFF)
CUFF TRNQT CYL 34X4X40X1 (TOURNIQUET CUFF) IMPLANT
DRAPE EXTREMITY T 121X128X90 (DRAPE) ×2 IMPLANT
DRAPE U-SHAPE 47X51 STRL (DRAPES) ×2 IMPLANT
DRSG ADAPTIC 3X8 NADH LF (GAUZE/BANDAGES/DRESSINGS) ×2 IMPLANT
DRSG PAD ABDOMINAL 8X10 ST (GAUZE/BANDAGES/DRESSINGS) ×4 IMPLANT
DURAPREP 26ML APPLICATOR (WOUND CARE) ×2 IMPLANT
ELECT REM PT RETURN 9FT ADLT (ELECTROSURGICAL) ×2
ELECTRODE REM PT RTRN 9FT ADLT (ELECTROSURGICAL) ×1 IMPLANT
FEMORAL ADAPTER (Orthopedic Implant) ×1 IMPLANT
FEMUR SIGMA PS SZ 2.5 R (Femur) ×1 IMPLANT
GLOVE BIO SURGEON STRL SZ7.5 (GLOVE) ×2 IMPLANT
GLOVE BIO SURGEON STRL SZ8 (GLOVE) ×2 IMPLANT
GLOVE BIOGEL PI IND STRL 8 (GLOVE) ×3 IMPLANT
GLOVE BIOGEL PI INDICATOR 8 (GLOVE) ×3
GOWN PREVENTION PLUS LG XLONG (DISPOSABLE) ×2 IMPLANT
GOWN STRL REIN XL XLG (GOWN DISPOSABLE) ×2 IMPLANT
KIT BASIN OR (CUSTOM PROCEDURE TRAY) ×2 IMPLANT
MANIFOLD NEPTUNE II (INSTRUMENTS) ×2 IMPLANT
PACK TOTAL JOINT (CUSTOM PROCEDURE TRAY) ×2 IMPLANT
PADDING CAST COTTON 6X4 STRL (CAST SUPPLIES) ×4 IMPLANT
PLATE ROT INSERT 10MM SIZE 2.5 (Plate) ×1 IMPLANT
POSITIONER SURGICAL ARM (MISCELLANEOUS) ×2 IMPLANT
SPONGE GAUZE 4X4 12PLY (GAUZE/BANDAGES/DRESSINGS) ×2 IMPLANT
STEM UNIVERSAL REVISION 75X18 (Stem) ×1 IMPLANT
STRIP CLOSURE SKIN 1/2X4 (GAUZE/BANDAGES/DRESSINGS) ×3 IMPLANT
SUT MNCRL AB 4-0 PS2 18 (SUTURE) ×2 IMPLANT
SUT VIC AB 2-0 CT1 27 (SUTURE) ×4
SUT VIC AB 2-0 CT1 TAPERPNT 27 (SUTURE) ×2 IMPLANT
SUT VLOC 180 0 24IN GS25 (SUTURE) ×2 IMPLANT
TOWEL OR 17X26 10 PK STRL BLUE (TOWEL DISPOSABLE) ×4 IMPLANT

## 2013-08-27 NOTE — Anesthesia Preprocedure Evaluation (Addendum)
Anesthesia Evaluation  Patient identified by MRN, date of birth, ID band Patient awake    Reviewed: Allergy & Precautions, H&P , NPO status , Patient's Chart, lab work & pertinent test results  Airway Mallampati: II      Dental  (+) Dental Advisory Given   Pulmonary neg pulmonary ROS, asthma ,          Cardiovascular hypertension, + Peripheral Vascular Disease     Neuro/Psych Anxiety  Neuromuscular disease    GI/Hepatic Neg liver ROS, GERD-  ,  Endo/Other  Hypothyroidism   Renal/GU negative Renal ROS     Musculoskeletal negative musculoskeletal ROS (+)   Abdominal   Peds  Hematology negative hematology ROS (+)   Anesthesia Other Findings   Reproductive/Obstetrics negative OB ROS                          Anesthesia Physical Anesthesia Plan  ASA: III  Anesthesia Plan: General   Post-op Pain Management:    Induction: Intravenous  Airway Management Planned: LMA  Additional Equipment:   Intra-op Plan:   Post-operative Plan: Extubation in OR  Informed Consent: I have reviewed the patients History and Physical, chart, labs and discussed the procedure including the risks, benefits and alternatives for the proposed anesthesia with the patient or authorized representative who has indicated his/her understanding and acceptance.   Dental advisory given  Plan Discussed with: CRNA  Anesthesia Plan Comments:         Anesthesia Quick Evaluation

## 2013-08-27 NOTE — Brief Op Note (Signed)
08/27/2013  3:21 PM  PATIENT:  Brianna Gates  62 y.o. female  PRE-OPERATIVE DIAGNOSIS:  ARTHROFIBROSIS RIGHT KNEE  POST-OPERATIVE DIAGNOSIS:  ARTHROFIBROSIS RIGHT KNEE  PROCEDURE:  Procedure(s): RIGHT KNEE SCAR EXCISION AND FEMORAL REVISION (Right)  SURGEON:  Surgeon(s) and Role:    * Loanne Drilling, MD - Primary  PHYSICIAN ASSISTANT:   ASSISTANTS: Avel Peace, PA-C   ANESTHESIA:   general  EBL:  Total I/O In: 1110 [I.V.:1000; IV Piggyback:110] Out: 200 [Urine:200]  BLOOD ADMINISTERED:none  DRAINS: (Medium) Hemovact drain(s) in the right knee with  Suction Open   LOCAL MEDICATIONS USED:  OTHER Exparel  SPECIMEN:  No Specimen  TOURNIQUET:   Total Tourniquet Time Documented: Thigh (Right) - 40 minutes Total: Thigh (Right) - 40 minutes   DICTATION: .Other Dictation: Dictation Number [id number]  PLAN OF CARE: Admit to inpatient   PATIENT DISPOSITION:  PACU - hemodynamically stable.

## 2013-08-27 NOTE — Anesthesia Postprocedure Evaluation (Signed)
Anesthesia Post Note  Patient: Brianna Gates  Procedure(s) Performed: Procedure(s) (LRB): RIGHT KNEE SCAR EXCISION AND FEMORAL REVISION (Right)  Anesthesia type: General  Patient location: PACU  Post pain: Pain level controlled  Post assessment: Post-op Vital signs reviewed  Last Vitals: BP 141/70  Pulse 68  Temp(Src) 36.6 C (Oral)  Resp 12  SpO2 100%  Post vital signs: Reviewed  Level of consciousness: sedated  Complications: No apparent anesthesia complications

## 2013-08-27 NOTE — Interval H&P Note (Signed)
History and Physical Interval Note:  08/27/2013 1:56 PM  Brianna Gates  has presented today for surgery, with the diagnosis of ARTHROFIBROSIS RIGHT KNEE  The various methods of treatment have been discussed with the patient and family. After consideration of risks, benefits and other options for treatment, the patient has consented to  Procedure(s): RIGHT KNEE ARTHROTOMY, SCAR EXCISION, POSSIBLE FEMORAL REVISION (Right) as a surgical intervention .  The patient's history has been reviewed, patient examined, no change in status, stable for surgery.  I have reviewed the patient's chart and labs.  Questions were answered to the patient's satisfaction.     Loanne Drilling

## 2013-08-27 NOTE — H&P (View-Only) (Signed)
Brianna Gates  DOB: November 06, 1950 Married / Language: English / Race: White Female  Date of Admission:  08/27/2013  Chief Complaint:  Right Knee Pain and Stiffness  History of Present Illness The patient is a 62 year old female who comes in for a preoperative History and Physical. The patient is scheduled for a right arthrotomy including exploration biopsy and removal loose body to be performed by Dr. Gus Rankin. Aluisio, MD at [facility] on 08/27/2013. The patient is a 62 year old female presenting for a post-operative visit. The patient comes in today 1 year (9 months) out from bilateral total knee arthroplasty (and 4 years 7 1/2 months on the right total knee). The patient states that he/she is doing fair at this time. They are currently on Hydrocodone for their pain. Note for "Post TKA": She is having problems with the knees, especially the right one. She says the range of motion on the right is very tight and her right knee will not fully extend. It only flexes to 90 degrees. Her Rheumatologist was concerned about her extension. Brianna Gates is very conscious about her limited motion of the right knee. The patient states that people talk to her about it all of the time. She has a lot of functional deficits because of it. She has had a total knee and also a manipulation. She exercises regularly but does not feel like she is making any improvement. If anything, she feels like she is getting stiffer. The left knee however is doing better. They have been treated conservatively in the past for the above stated problem and despite conservative measures, they continue to have progressive pain and severe functional limitations and dysfunction. They have failed non-operative management including home exercise, medications. It is felt that they would benefit from undergoing arthrotomy and possible total joint revision. Risks and benefits of the procedure have been discussed with  the patient and they elect to proceed with surgery. There are no active contraindications to surgery such as ongoing infection or rapidly progressive neurological disease.    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 Deceased, Myocardial infarction. Age 70    Social History Marital status. Married. Tobacco use. Never smoker. Alcohol use. Drinks wine.    Medication History Vitamin D (400UNIT Capsule, 1 (one) Oral) Active. Vitamin E (200UNIT Capsule, 1 (one) Oral) Active. Gabapentin ( Oral) Specific dose unknown - Active. Pantoprazole Sodium (40MG  Tablet DR, Oral) Active. CeleBREX (200MG  Capsule, Oral) Active. Synthroid ( Oral) Specific dose unknown - Active. Losartan Potassium ( Oral) Specific dose unknown - Active. Baby Aspirin ( Oral) Specific dose unknown - Active. Wellbutrin SR (150MG  Tablet ER 12HR, Oral) Active. Simvastatin (20MG  Tablet, Oral) Active. Norco (10-325MG  Tablet, 1 Oral po bid prn pain, Taken starting 07/24/2013) Active. ZyrTEC Allergy ( Oral) Specific dose unknown - Active.    Past Surgical History Right Eye Surgery Total Knee Replacement - Right. Date: 2009. Hysterectomy (not due to cancer) - Partial Tonsillectomy. Adnoids    Medical History GuillainBarr Syndrome Anxiety Disorder Hypertension Hypercholesterolemia Thyroid Nodule Asthma Bronchitis Pneumonia Childhood Illness: Mumps Childhood Illness: Measels Nodular Thyroid Eczema Menopause    Review of Systems General:Present- Night Sweats and Fatigue. Not Present- Chills, Fever, Weight Gain, Weight Loss and Memory Loss. Skin:Not Present- Hives, Itching, Rash, Eczema and Lesions. HEENT:Not Present- Tinnitus, Headache, Double Vision, Visual Loss, Hearing Loss and Dentures. Respiratory:Not Present- Shortness of  breath with exertion, Shortness of breath at rest, Allergies, Coughing up blood and Chronic Cough. Cardiovascular:Not Present- Chest Pain, Racing/skipping heartbeats, Difficulty Breathing Lying Down, Murmur, Swelling and Palpitations. Gastrointestinal:Not Present- Bloody Stool, Heartburn, Abdominal Pain, Vomiting, Nausea, Constipation, Diarrhea, Difficulty Swallowing, Jaundice and Loss of appetitie. Female Genitourinary:Not Present- Blood in Urine, Urinary frequency, Weak urinary stream, Discharge, Flank Pain, Incontinence, Painful Urination, Urgency, Urinary Retention and Urinating at Night. Musculoskeletal:Present- Muscle Pain, Joint Swelling, Joint Pain, Back Pain and Morning Stiffness. Not Present- Muscle Weakness and Spasms. Neurological:Not Present- Tremor, Dizziness, Blackout spells, Paralysis, Difficulty with balance and Weakness. Psychiatric:Not Present- Insomnia.    Vitals Weight: 123 lb Height: 64.5 in Weight was reported by patient. Height was reported by patient. Body Surface Area: 1.59 m Body Mass Index: 20.79 kg/m BP: 122/78 (Sitting, Right Arm, Standard)     Physical Exam The physical exam findings are as follows:   General Mental Status - Alert, cooperative and good historian. General Appearance- pleasant. Not in acute distress. Orientation- Oriented X3. Build & Nutrition- Well nourished and Well developed.   Head and Neck Head- normocephalic, atraumatic . Neck Global Assessment- supple. no bruit auscultated on the right and no bruit auscultated on the left.   Eye Pupil- Bilateral- Regular and Round. Motion- Bilateral- EOMI.   Chest and Lung Exam Auscultation: Breath sounds:- clear at anterior chest wall and - clear at posterior chest wall. Adventitious sounds:- No Adventitious sounds.   Cardiovascular Auscultation:Rhythm- Regular rate and rhythm. Heart Sounds- S1 WNL and S2 WNL. Murmurs & Other Heart  Sounds:Auscultation of the heart reveals - No Murmurs.   Abdomen Palpation/Percussion:Tenderness- Abdomen is non-tender to palpation. Rigidity (guarding)- Abdomen is soft. Auscultation:Auscultation of the abdomen reveals - Bowel sounds normal.   Female Genitourinary Not done, not pertinent to present illness  Musculoskeletal She is alert and oriented in no apparent distress.  Evaluation of the left knee, range of motion about zero to 110 degrees actively, 115 degrees passively. There is no tenderness or instability.  Evaluation of the right knee, range of motion is 10 to 95 degrees actively, close to 100 degrees passively. She does walk very stiff legged.   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 Lequita Halt.  Plan is to go Home.  PCP - Dr. Georgann Housekeeper  The patient does not have any contraindications and will receive TXA (tranexamic acid) prior to surgery.  Signed electronically by Lauraine Rinne, III PA-C

## 2013-08-27 NOTE — Plan of Care (Signed)
Problem: Diagnosis - Type of Surgery Goal: General Surgical Patient Education (See Patient Education module for education specifics) Outcome: Completed/Met Date Met:  08/27/13 In office

## 2013-08-27 NOTE — Preoperative (Signed)
Beta Blockers   Reason not to administer Beta Blockers:Not Applicable, not on home bb 

## 2013-08-27 NOTE — Transfer of Care (Signed)
Immediate Anesthesia Transfer of Care Note  Patient: Brianna Gates  Procedure(s) Performed: Procedure(s) (LRB): RIGHT KNEE SCAR EXCISION AND FEMORAL REVISION (Right)  Patient Location: PACU  Anesthesia Type: General  Level of Consciousness: sedated, patient cooperative and responds to stimulation  Airway & Oxygen Therapy: Patient Spontanous Breathing and Patient connected to face mask oxgen  Post-op Assessment: Report given to PACU RN and Post -op Vital signs reviewed and stable  Post vital signs: Reviewed and stable  Complications: No apparent anesthesia complications

## 2013-08-27 NOTE — Progress Notes (Signed)
Patient on stretcher upon arrival to PACU- CPM to be started when patient goes to floor and is place in a bed.

## 2013-08-28 ENCOUNTER — Encounter (HOSPITAL_COMMUNITY): Payer: Self-pay | Admitting: Orthopedic Surgery

## 2013-08-28 LAB — CBC
Hemoglobin: 12.9 g/dL (ref 12.0–15.0)
MCH: 32.9 pg (ref 26.0–34.0)
MCV: 97.4 fL (ref 78.0–100.0)
Platelets: 173 10*3/uL (ref 150–400)
RBC: 3.92 MIL/uL (ref 3.87–5.11)
WBC: 9.2 10*3/uL (ref 4.0–10.5)

## 2013-08-28 LAB — BASIC METABOLIC PANEL
BUN: 11 mg/dL (ref 6–23)
CO2: 29 mEq/L (ref 19–32)
Calcium: 9.1 mg/dL (ref 8.4–10.5)
Chloride: 103 mEq/L (ref 96–112)
Creatinine, Ser: 0.56 mg/dL (ref 0.50–1.10)
Glucose, Bld: 142 mg/dL — ABNORMAL HIGH (ref 70–99)
Sodium: 139 mEq/L (ref 135–145)

## 2013-08-28 NOTE — Progress Notes (Signed)
   Subjective: 1 Day Post-Op Procedure(s) (LRB): RIGHT KNEE SCAR EXCISION AND FEMORAL REVISION (Right) Patient reports pain as mild.   Patient seen in rounds with Dr. Lequita Halt. Patient is well, but has had some minor complaints of pain in the knee, requiring pain medications We will start therapy today.  Plan is to go Home after hospital stay.  Objective: Vital signs in last 24 hours: Temp:  [97.2 F (36.2 C)-98.7 F (37.1 C)] 98.7 F (37.1 C) (11/13 0931) Pulse Rate:  [53-76] 58 (11/13 0931) Resp:  [8-18] 16 (11/13 0931) BP: (97-155)/(62-78) 97/63 mmHg (11/13 0931) SpO2:  [98 %-100 %] 100 % (11/13 0931) Weight:  [53.978 kg (119 lb)] 53.978 kg (119 lb) (11/12 1741)  Intake/Output from previous day:  Intake/Output Summary (Last 24 hours) at 08/28/13 1006 Last data filed at 08/28/13 0934  Gross per 24 hour  Intake   2865 ml  Output   3416 ml  Net   -551 ml    Intake/Output this shift: Total I/O In: 360 [P.O.:360] Out: 500 [Urine:500]  Labs:  Recent Labs  08/28/13 0407  HGB 12.9    Recent Labs  08/28/13 0407  WBC 9.2  RBC 3.92  HCT 38.2  PLT 173    Recent Labs  08/28/13 0407  NA 139  K 3.9  CL 103  CO2 29  BUN 11  CREATININE 0.56  GLUCOSE 142*  CALCIUM 9.1   No results found for this basename: LABPT, INR,  in the last 72 hours  EXAM General - Patient is Alert, Appropriate and Oriented Extremity - Neurovascular intact Sensation intact distally Dressing - dressing C/D/I Motor Function - intact, moving foot and toes well on exam.  Hemovac pulled without difficulty.  Past Medical History  Diagnosis Date  . Guillain-Barre syndrome   . Hypertension   . Dyslipidemia   . Degenerative arthritis   . Hypothyroidism   . Thyroid nodule   . Cervical spondylosis   . Lumbosacral spondylosis   . Raynaud disease   . GERD (gastroesophageal reflux disease)   . Complication of anesthesia     UNKNOWN REACTION WITH RASH AT SITE OF IV   . Asthma    INFREQUENT PROBLEM  . Eczema   . Anxiety     Assessment/Plan: 1 Day Post-Op Procedure(s) (LRB): RIGHT KNEE SCAR EXCISION AND FEMORAL REVISION (Right) Principal Problem:   Postoperative stiffness of total knee replacement  Estimated body mass index is 20.42 kg/(m^2) as calculated from the following:   Height as of this encounter: 5\' 4"  (1.626 m).   Weight as of this encounter: 53.978 kg (119 lb). Up with therapy Plan for discharge tomorrow Discharge home with home health Patient will need a Home CPM  DVT Prophylaxis - Xarelto Weight-Bearing as tolerated to right leg D/C O2 and Pulse OX and try on Room Air  Villa Burgin 08/28/2013, 10:06 AM

## 2013-08-28 NOTE — Care Management Note (Addendum)
    Page 1 of 1   08/29/2013     1:42:01 PM   CARE MANAGEMENT NOTE 08/29/2013  Patient:  Brianna Gates, Brianna Gates   Account Number:  [id number]  Date Initiated:  08/28/2013  Documentation initiated by:  Colleen Can  Subjective/Objective Assessment:   dx arthrofibrosis right knee; rt knee scar excision & femoral revision     Action/Plan:   Home upon discharge; CM will follow for needs.   Anticipated DC Date:  08/29/2013   Anticipated DC Plan:  HOME W HOME HEALTH SERVICES      DC Planning Services  CM consult      [facility] Choice  HOME HEALTH   Choice offered to / List presented to:  C-1 Patient   DME arranged  3-N-1      DME agency  [facility]        Status of service:  Completed, signed off Medicare Important Message given?   (If response is "NO", the following Medicare IM given date fields will be blank) Date Medicare IM given:   Date Additional Medicare IM given:    Discharge Disposition:  HOME/SELF CARE  Per UR Regulation:  Reviewed for med. necessity/level of care/duration of stay  If discussed at Long Length of Stay Meetings, dates discussed:    Comments:  08/29/2013 Colleen Can BSN RN CCM [phone] Pt will have OP therapy that has already been set up per patient. Home CPM is being arranged by [place]. Pt discharging to home where spouse will be caregiver. 3n1 has been delivered to patient's room.

## 2013-08-28 NOTE — Evaluation (Signed)
Occupational Therapy Evaluation Patient Details Name: Brianna Gates MRN: [id number] DOB: August 20, 1951 Today's Date: 08/28/2013 Time: 3875-6433 OT Time Calculation (min): 27 min  OT Assessment / Plan / Recommendation History of present illness knee surgery   Clinical Impression   Pt presents to OT with decreased I with s/p knee surgery. Pt will benefit from skilled OT to increase I with ADL activity and return to PLOF.    OT Assessment  Patient needs continued OT Services    Follow Up Recommendations  No OT follow up       Equipment Recommendations  3 in 1 bedside comode;Tub/shower seat       Frequency  Min 2X/week    Precautions / Restrictions Restrictions Weight Bearing Restrictions: No       ADL  Grooming: Set up Where Assessed - Grooming: Unsupported sitting Upper Body Bathing: Set up Where Assessed - Upper Body Bathing: Unsupported sitting Lower Body Bathing: Moderate assistance Where Assessed - Lower Body Bathing: Unsupported sit to stand Upper Body Dressing: Set up Where Assessed - Upper Body Dressing: Unsupported sitting;Unsupported sit to stand Lower Body Dressing: Moderate assistance Where Assessed - Lower Body Dressing: Unsupported sit to stand Toilet Transfer: Performed;Min guard Toilet Transfer Method: Sit to Barista: Comfort height toilet Toileting - Clothing Manipulation and Hygiene: Min guard Where Assessed - Toileting Clothing Manipulation and Hygiene: Standing    OT Diagnosis: Generalized weakness  OT Problem List: Decreased strength OT Treatment Interventions: Self-care/ADL training;Patient/family education;DME and/or AE instruction   OT Goals(Current goals can be found in the care plan section) Acute Rehab OT Goals Patient Stated Goal: go home and then to OP OT Goal Formulation: With patient Time For Goal Achievement: 09/11/13 Potential to Achieve Goals: Good  Visit Information  Last OT Received On:  08/28/13 Assistance Needed: +1 History of Present Illness: knee surgery       Prior Functioning     Home Living Family/patient expects to be discharged to:: Private residence Living Arrangements: Alone Available Help at Discharge: Family Type of Home: House Home Access: Stairs to enter [employer] of Steps: go in through garage- there are 5 or 6 steps.  Rail is wobbly.   Home Layout: One level Home Equipment: None Communication Communication: No difficulties         Vision/Perception Vision - History Patient Visual Report: No change from baseline Vision - Assessment Eye Alignment: Within Functional Limits   Cognition  Cognition Arousal/Alertness: Awake/alert Behavior During Therapy: WFL for tasks assessed/performed Overall Cognitive Status: Within Functional Limits for tasks assessed    Extremity/Trunk Assessment Upper Extremity Assessment Upper Extremity Assessment: Overall WFL for tasks assessed     Mobility Bed Mobility Bed Mobility: Not assessed Transfers Transfers: Sit to Stand;Stand to Sit Sit to Stand: 4: Min assist;From chair/3-in-1;From toilet;From bed Stand to Sit: 4: Min assist;With upper extremity assist;To toilet;To chair/3-in-1           End of Session OT - End of Session Activity Tolerance: Patient tolerated treatment well Patient left: in chair Nurse Communication: Mobility status CPM Right Knee CPM Right Knee: Off  GO     Alba Cory 08/28/2013, 11:56 AM

## 2013-08-28 NOTE — Evaluation (Signed)
Physical Therapy Evaluation Patient Details Name: Brianna Gates MRN: [id number] DOB: [birth year] Today's Date: 08/28/2013 Time: 1010-1105 PT Time Calculation (min): 55 min  PT Assessment / Plan / Recommendation History of Present Illness  knee surgery  Clinical Impression  Pt s/p R TKR scar tissue excision and femoral revision presents with decreased R LE strength/ROM and post op pain limiting functional mobility.  Pt should progress to d/c home with family assist and HHPT follow up.    PT Assessment  Patient needs continued PT services    Follow Up Recommendations  Home health PT    Does the patient have the potential to tolerate intense rehabilitation      Barriers to Discharge        Equipment Recommendations  None recommended by PT    Recommendations for Other Services OT consult   Frequency 7X/week    Precautions / Restrictions Precautions Precautions: Knee;Fall Required Braces or Orthoses: Knee Immobilizer - Right Knee Immobilizer - Right: Discontinue once straight leg raise with < 10 degree lag Restrictions Weight Bearing Restrictions: No Other Position/Activity Restrictions: WBAT   Pertinent Vitals/Pain 5/10; premed, ice packs provided      Mobility  Bed Mobility Bed Mobility: Not assessed Supine to Sit: 4: Min assist Details for Bed Mobility Assistance: cues for sequence and use of L LE to self assist Transfers Transfers: Sit to Stand;Stand to Sit Sit to Stand: 4: Min assist;From chair/3-in-1;From toilet;From bed Stand to Sit: 4: Min assist;With upper extremity assist;To toilet;To chair/3-in-1 Details for Transfer Assistance: Cues for LE management and use of UEs to self assist Ambulation/Gait Ambulation/Gait Assistance: 4: Min assist Ambulation Distance (Feet): 75 Feet Assistive device: Rolling walker Ambulation/Gait Assistance Details: cues for sequence, stride length, postion from RW, posture Gait Pattern: Step-to pattern;Decreased step  length - right;Decreased step length - left;Shuffle;Trunk flexed;Antalgic Gait velocity: decr Stairs: No    Exercises Total Joint Exercises Ankle Circles/Pumps: AROM;10 reps;Supine;Both Quad Sets: AROM;Both;10 reps;Supine Heel Slides: AAROM;10 reps;Supine;Right Straight Leg Raises: AAROM;10 reps;Supine;Right   PT Diagnosis: Difficulty walking  PT Problem List: Decreased strength;Decreased range of motion;Decreased activity tolerance;Decreased mobility;Decreased knowledge of use of DME;Pain;Decreased knowledge of precautions PT Treatment Interventions: DME instruction;Gait training;Stair training;Functional mobility training;Therapeutic activities;Therapeutic exercise;Patient/family education     PT Goals(Current goals can be found in the care plan section) Acute Rehab PT Goals Patient Stated Goal: go home and then to OP PT Goal Formulation: With patient Time For Goal Achievement: 08/28/13 Potential to Achieve Goals: Good  Visit Information  Last PT Received On: 08/28/13 Assistance Needed: +1 History of Present Illness: knee surgery       Prior Functioning  Home Living Family/patient expects to be discharged to:: Private residence Living Arrangements: Alone Available Help at Discharge: Family Type of Home: House Home Access: Stairs to enter [employer] of Steps: go in through garage- there are 5 or 6 steps.  Rail is wobbly.   Entrance Stairs-Rails: Right Home Layout: One level Home Equipment: None Prior Function Level of Independence: Independent Communication Communication: No difficulties    Cognition  Cognition Arousal/Alertness: Awake/alert Behavior During Therapy: WFL for tasks assessed/performed Overall Cognitive Status: Within Functional Limits for tasks assessed    Extremity/Trunk Assessment Upper Extremity Assessment Upper Extremity Assessment: Overall WFL for tasks assessed Lower Extremity Assessment Lower Extremity Assessment: RLE  deficits/detail RLE Deficits / Details: Quads 2-/5 with AAROM at knee -10 - 40   Balance    End of Session PT - End of Session Equipment Utilized During Treatment: Gait  belt;Right knee immobilizer Activity Tolerance: Patient tolerated treatment well Patient left: in chair;with call bell/phone within reach Nurse Communication: Mobility status  GP     Tor Tsuda 08/28/2013, 1:01 PM

## 2013-08-28 NOTE — Progress Notes (Signed)
Physical Therapy Treatment Patient Details Name: Brianna Gates MRN: [id number] DOB: 1950/12/11 Today's Date: 08/28/2013 Time: 1478-2956 PT Time Calculation (min): 27 min  PT Assessment / Plan / Recommendation  History of Present Illness     PT Comments     Follow Up Recommendations  Outpatient PT     Does the patient have the potential to tolerate intense rehabilitation     Barriers to Discharge        Equipment Recommendations  None recommended by PT    Recommendations for Other Services OT consult  Frequency 7X/week   Progress towards PT Goals Progress towards PT goals: Progressing toward goals  Plan Discharge plan needs to be updated (Pt states Dr wants her to go directly to OP vs HHPT)    Precautions / Restrictions Precautions Precautions: Knee;Fall Required Braces or Orthoses: Knee Immobilizer - Right Knee Immobilizer - Right: Discontinue once straight leg raise with < 10 degree lag Restrictions Weight Bearing Restrictions: No Other Position/Activity Restrictions: WBAT   Pertinent Vitals/Pain 4/10; premed    Mobility  Bed Mobility Bed Mobility: Sit to Supine Sit to Supine: 4: Min assist Details for Bed Mobility Assistance: cues for sequence and use of L LE to self assist Transfers Transfers: Sit to Stand;Stand to Sit Sit to Stand: 4: Min assist;From chair/3-in-1 Stand to Sit: 4: Min assist;With upper extremity assist;To bed Details for Transfer Assistance: Cues for LE management and use of UEs to self assist Ambulation/Gait Ambulation/Gait Assistance: 4: Min assist Ambulation Distance (Feet): 200 Feet Assistive device: Rolling walker Ambulation/Gait Assistance Details: cues for sequence, stride length, posture, position from RW Gait Pattern: Step-to pattern;Decreased step length - right;Decreased step length - left;Shuffle;Trunk flexed;Antalgic Gait velocity: decr Stairs: No    Exercises     PT Diagnosis:    PT Problem List:   PT Treatment  Interventions:     PT Goals (current goals can now be found in the care plan section) Acute Rehab PT Goals Patient Stated Goal: go home and then to OP PT Goal Formulation: With patient Time For Goal Achievement: 08/28/13 Potential to Achieve Goals: Good  Visit Information  Last PT Received On: 08/28/13 Assistance Needed: +1    Subjective Data  Patient Stated Goal: go home and then to OP   Cognition  Cognition Arousal/Alertness: Awake/alert Behavior During Therapy: WFL for tasks assessed/performed Overall Cognitive Status: Within Functional Limits for tasks assessed    Balance     End of Session PT - End of Session Equipment Utilized During Treatment: Gait belt;Right knee immobilizer Activity Tolerance: Patient tolerated treatment well Patient left: in bed;with call bell/phone within reach;with family/visitor present Nurse Communication: Mobility status   GP     Brianna Gates 08/28/2013, 4:11 PM

## 2013-08-29 LAB — CBC
HCT: 36.8 % (ref 36.0–46.0)
MCH: 32.7 pg (ref 26.0–34.0)
MCV: 97.9 fL (ref 78.0–100.0)
RBC: 3.76 MIL/uL — ABNORMAL LOW (ref 3.87–5.11)
WBC: 8.2 10*3/uL (ref 4.0–10.5)

## 2013-08-29 LAB — BASIC METABOLIC PANEL
BUN: 14 mg/dL (ref 6–23)
CO2: 27 mEq/L (ref 19–32)
Calcium: 9.8 mg/dL (ref 8.4–10.5)
Chloride: 103 mEq/L (ref 96–112)
Creatinine, Ser: 0.6 mg/dL (ref 0.50–1.10)
GFR calc Af Amer: >90 mL/min (ref >90)
Glucose, Bld: 105 mg/dL — ABNORMAL HIGH (ref 70–99)
Potassium: 3.5 mEq/L (ref 3.5–5.1)

## 2013-08-29 MED ORDER — TRAMADOL HCL 50 MG PO TABS: 50.0000 mg | ORAL_TABLET | Freq: Four times a day (QID) | ORAL | Status: DC | PRN

## 2013-08-29 MED ORDER — RIVAROXABAN 10 MG PO TABS: 10.0000 mg | ORAL_TABLET | Freq: Every day | ORAL | Status: DC

## 2013-08-29 MED ORDER — OXYCODONE HCL 5 MG PO TABS: 5.0000 mg | ORAL_TABLET | ORAL | Status: DC | PRN

## 2013-08-29 MED ORDER — METHOCARBAMOL 500 MG PO TABS: 500.0000 mg | ORAL_TABLET | Freq: Four times a day (QID) | ORAL | Status: DC | PRN

## 2013-08-29 NOTE — Progress Notes (Signed)
[facility]  [facility] is providing the following services: [employer]  If patient discharges after hours, please call [phone].   Renard Hamper 08/29/2013, 12:12 PM

## 2013-08-29 NOTE — Progress Notes (Signed)
Occupational Therapy Treatment Patient Details Name: Brianna Gates MRN: [id number] DOB: 04-27-1951 Today's Date: 08/29/2013 Time: 1308-6578 OT Time Calculation (min): 16 min  OT Assessment / Plan / Recommendation     OT comments  Pt needed max VC for this OT visit to problem solve equipment needs at home.  OT went over same information mutiple times. ? If this is pain meds?  OT did share concerns regarding pt at home alone with pt and PT  Follow Up Recommendations  No OT follow up       Equipment Recommendations  3 in 1 bedside comode             Plan Discharge plan remains appropriate    Precautions / Restrictions Precautions Precautions: Knee;Fall Required Braces or Orthoses: Knee Immobilizer - Right Knee Immobilizer - Right: Discontinue once straight leg raise with < 10 degree lag Restrictions Weight Bearing Restrictions: No Other Position/Activity Restrictions: WBAT       ADL  Transfers/Ambulation Related to ADLs: Lengthy discussion with pt regarding equipment.  After much discussion it was decided pt would be a 3 n 1 from hospital and go to local medical store for the tub seat  that has handles on each side.  Pt seemed to have a hard time understanding difference in each piece of equipment with dicussion and demonstration. Pt very focused  on not bothering her spouse with ADL activity. Daugthers will  A as needed        Visit Information  Assistance Needed: +1             Mobility  Bed Mobility Details for Bed Mobility Assistance: cues for sequence and use of L LE to self assist Transfers Transfers: Sit to Stand;Stand to Sit Sit to Stand: From chair/3-in-1;4: Min guard Stand to Sit: With upper extremity assist;To bed;4: Min guard Details for Transfer Assistance: Cues for LE management and use of UEs to self assist          End of Session OT - End of Session Activity Tolerance: Patient tolerated treatment well Patient left: in chair;with call  bell/phone within reach CPM Right Knee CPM Right Knee: Off  GO     Alba Cory 08/29/2013, 9:16 AM

## 2013-08-29 NOTE — Progress Notes (Signed)
Physical Therapy Treatment Patient Details Name: Brianna Gates MRN: [id number] DOB: [birth year] Today's Date: 08/29/2013 Time: 1205-1230 PT Time Calculation (min): 25 min  PT Assessment / Plan / Recommendation  History of Present Illness     PT Comments   Pt and spouse educated on stairs.  RW adjusted for height.  Pt with multiple questions answered.  Follow Up Recommendations  Outpatient PT     Does the patient have the potential to tolerate intense rehabilitation     Barriers to Discharge        Equipment Recommendations  None recommended by PT    Recommendations for Other Services OT consult  Frequency 7X/week   Progress towards PT Goals Progress towards PT goals: Progressing toward goals  Plan Current plan remains appropriate    Precautions / Restrictions Precautions Precautions: Knee;Fall Required Braces or Orthoses: Knee Immobilizer - Right Knee Immobilizer - Right: Discontinue once straight leg raise with < 10 degree lag Restrictions Weight Bearing Restrictions: No Other Position/Activity Restrictions: WBAT   Pertinent Vitals/Pain 4/10; premed, cold packs provided    Mobility  Bed Mobility Bed Mobility: Not assessed Transfers Transfers: Sit to Stand;Stand to Sit Sit to Stand: 5: Supervision;4: Min guard Stand to Sit: 5: Supervision Details for Transfer Assistance: Cues for LE management and use of UEs to self assist Ambulation/Gait Ambulation/Gait Assistance: 4: Min guard;5: Supervision Ambulation Distance (Feet): 100 Feet Assistive device: Rolling walker Ambulation/Gait Assistance Details: min cues for position from RW Gait Pattern: Step-to pattern;Step-through pattern;Decreased step length - right;Decreased step length - left Gait velocity: decr Stairs: Yes Stairs Assistance: 4: Min assist Stairs Assistance Details (indicate cue type and reason): cues for sequence and foot/crutch placement Stair Management Technique: One rail Left;Step to  pattern;Forwards;With crutches Number of Stairs: 8    Exercises Total Joint Exercises Ankle Circles/Pumps: AROM;Supine;Both;20 reps Quad Sets: AROM;Both;Supine;20 reps Heel Slides: AAROM;Supine;Right;15 reps Straight Leg Raises: AAROM;Supine;Right;20 reps   PT Diagnosis:    PT Problem List:   PT Treatment Interventions:     PT Goals (current goals can now be found in the care plan section) Acute Rehab PT Goals Patient Stated Goal: go home and then to OP PT Goal Formulation: With patient Time For Goal Achievement: 08/28/13 Potential to Achieve Goals: Good  Visit Information  Last PT Received On: 08/29/13 Assistance Needed: +1    Subjective Data  Patient Stated Goal: go home and then to OP   Cognition  Cognition Arousal/Alertness: Awake/alert Behavior During Therapy: WFL for tasks assessed/performed Overall Cognitive Status: Within Functional Limits for tasks assessed    Balance     End of Session PT - End of Session Equipment Utilized During Treatment: Right knee immobilizer Activity Tolerance: Patient tolerated treatment well Patient left: in chair;with call bell/phone within reach;with family/visitor present Nurse Communication: Mobility status   GP     Brianna Gates 08/29/2013, 1:21 PM

## 2013-08-29 NOTE — Progress Notes (Signed)
Pt to d/c home. DME delivered to room before d/c. AVS reviewed and "My Chart" discussed with pt. Pt capable of verbalizing medications and follow-up appointments. Remains hemodynamically stable. No signs and symptoms of distress. Educated pt to return to ER in the case of SOB, dizziness, or chest pain.

## 2013-08-29 NOTE — Progress Notes (Signed)
   Subjective: 2 Days Post-Op Procedure(s) (LRB): RIGHT KNEE SCAR EXCISION AND FEMORAL REVISION (Right) Patient reports pain as mild and moderate after a lot of therapy yesterday. Patient seen in rounds with Dr. Lequita Halt. Patient is well, but has had some minor complaints of pain in the knee, requiring pain medications Patient is ready to go home later today.  Will need CPM for home use.  Objective: Vital signs in last 24 hours: Temp:  [98 F (36.7 C)-98.7 F (37.1 C)] 98.4 F (36.9 C) (11/14 0655) Pulse Rate:  [55-61] 55 (11/14 0655) Resp:  [14-18] 16 (11/14 0655) BP: (96-124)/(52-73) 124/73 mmHg (11/14 0655) SpO2:  [97 %-100 %] 98 % (11/14 0655)  Intake/Output from previous day:  Intake/Output Summary (Last 24 hours) at 08/29/13 0725 Last data filed at 08/28/13 2106  Gross per 24 hour  Intake 1851.25 ml  Output   2700 ml  Net -848.75 ml    Intake/Output this shift:    Labs:  Recent Labs  08/28/13 0407 08/29/13 0405  HGB 12.9 12.3    Recent Labs  08/28/13 0407 08/29/13 0405  WBC 9.2 8.2  RBC 3.92 3.76*  HCT 38.2 36.8  PLT 173 153    Recent Labs  08/28/13 0407 08/29/13 0405  NA 139 138  K 3.9 3.5  CL 103 103  CO2 29 27  BUN 11 14  CREATININE 0.56 0.60  GLUCOSE 142* 105*  CALCIUM 9.1 9.8   No results found for this basename: LABPT, INR,  in the last 72 hours  EXAM: General - Patient is Alert, Appropriate and Oriented Extremity - Neurovascular intact Sensation intact distally Dorsiflexion/Plantar flexion intact Incision - clean, dry, no drainage Motor Function - intact, moving foot and toes well on exam.   Assessment/Plan: 2 Days Post-Op Procedure(s) (LRB): RIGHT KNEE SCAR EXCISION AND FEMORAL REVISION (Right) Procedure(s) (LRB): RIGHT KNEE SCAR EXCISION AND FEMORAL REVISION (Right) Past Medical History  Diagnosis Date  . Guillain-Barre syndrome   . Hypertension   . Dyslipidemia   . Degenerative arthritis   . Hypothyroidism   .  Thyroid nodule   . Cervical spondylosis   . Lumbosacral spondylosis   . Raynaud disease   . GERD (gastroesophageal reflux disease)   . Complication of anesthesia     UNKNOWN REACTION WITH RASH AT SITE OF IV   . Asthma     INFREQUENT PROBLEM  . Eczema   . Anxiety    Principal Problem:   Postoperative stiffness of total knee replacement  Estimated body mass index is 20.42 kg/(m^2) as calculated from the following:   Height as of this encounter: 5\' 4"  (1.626 m).   Weight as of this encounter: 53.978 kg (119 lb). Up with therapy Discharge home with home health and HOME CPM Diet - Cardiac diet Follow up - in 2 weeks Activity - WBAT Disposition - Home Condition Upon Discharge - Good D/C Meds - See DC Summary DVT Prophylaxis - Xarelto  Pang Robers 08/29/2013, 7:25 AM

## 2013-08-29 NOTE — Progress Notes (Signed)
Physical Therapy Treatment Patient Details Name: Brianna Gates MRN: [id number] DOB: 02-11-1951 Today's Date: 08/29/2013 Time: 7829-5621 PT Time Calculation (min): 31 min  PT Assessment / Plan / Recommendation  History of Present Illness     PT Comments   Multiple rests required for task completion with multiple questions from pt.  Reviewed don/doff KI and trimmed to fit  Follow Up Recommendations  Outpatient PT     Does the patient have the potential to tolerate intense rehabilitation     Barriers to Discharge        Equipment Recommendations  None recommended by PT    Recommendations for Other Services OT consult  Frequency 7X/week   Progress towards PT Goals Progress towards PT goals: Progressing toward goals  Plan Current plan remains appropriate    Precautions / Restrictions Precautions Precautions: Knee;Fall Required Braces or Orthoses: Knee Immobilizer - Right Knee Immobilizer - Right: Discontinue once straight leg raise with < 10 degree lag Restrictions Weight Bearing Restrictions: No Other Position/Activity Restrictions: WBAT   Pertinent Vitals/Pain 6/10; MEDs requested, cold pack provided    Mobility  Bed Mobility Bed Mobility: Not assessed Transfers Transfers: Not assessed Ambulation/Gait Ambulation/Gait Assistance: Not tested (comment)    Exercises Total Joint Exercises Ankle Circles/Pumps: AROM;Supine;Both;20 reps Quad Sets: AROM;Both;Supine;20 reps Heel Slides: AAROM;Supine;Right;15 reps Straight Leg Raises: AAROM;Supine;Right;20 reps   PT Diagnosis:    PT Problem List:   PT Treatment Interventions:     PT Goals (current goals can now be found in the care plan section) Acute Rehab PT Goals Patient Stated Goal: go home and then to OP PT Goal Formulation: With patient Time For Goal Achievement: 08/28/13 Potential to Achieve Goals: Good  Visit Information  Last PT Received On: 08/29/13 Assistance Needed: +1    Subjective Data  Patient Stated Goal: go home and then to OP   Cognition  Cognition Arousal/Alertness: Awake/alert Behavior During Therapy: WFL for tasks assessed/performed Overall Cognitive Status: Within Functional Limits for tasks assessed    Balance     End of Session PT - End of Session Equipment Utilized During Treatment: Right knee immobilizer Activity Tolerance: Patient tolerated treatment well Patient left: in chair;with call bell/phone within reach;with family/visitor present Nurse Communication: Mobility status   GP     Han Vejar 08/29/2013, 1:15 PM

## 2013-08-29 NOTE — Op Note (Signed)
Brianna Gates, [name] NO.:  [id number]  MEDICAL RECORD NO.:  [id number]  LOCATION:  1604                         FACILITY:  [facility]  PHYSICIAN:  Ollen Gross, M.D.    DATE OF BIRTH:  April 05, 1951  DATE OF PROCEDURE:  08/27/2013 DATE OF DISCHARGE:                              OPERATIVE REPORT   PREOPERATIVE DIAGNOSIS:  Arthrofibrosis, right knee with flexion contracture.  POSTOPERATIVE DIAGNOSIS:  Arthrofibrosis, right knee with flexion contracture.  PROCEDURE:  Right knee arthrotomy, scar excision, and femoral revision.  SURGEON:  Ollen Gross, M.D.  ASSISTANT:  Alexzandrew L. Perkins, PA-C.  ANESTHESIA:  General.  ESTIMATED BLOOD LOSS:  Minimal.  DRAINS:  Hemovac x1.  TOURNIQUET TIME:  40 minutes at 300 mmHg.  COMPLICATIONS:  None.  CONDITION:  Stable to recovery.  BRIEF CLINICAL NOTE:  Brianna Gates is a 62 year old female, who had a right total knee arthroplasty done several years ago.  She had problems with stiffness in the knee.  She has gone on to develop about 15-20 degrees flexion contracture in addition to limited flexion of about 90 degrees. This did not respond to close manipulation.  She presents now for arthrotomy, scar excision and possible femoral revision to regain extension.  PROCEDURE IN DETAIL:  After successful administration of general anesthetic, a tourniquet was placed high on her right thigh and her right lower extremity was prepped and draped in the usual sterile fashion.  Extremities wrapped in Esmarch, knee flexed, and tourniquet inflated to 300 mmHg.  A midline incision was made with a 10-blade through the subcutaneous tissue to the extensor mechanism.  I created subcu flaps to break any adhesions in the subcu.  A fresh blade was used to make a medial parapatellar arthrotomy.  We did not encounter any fluid in the joint.  Soft tissue on the proximal medial tibia, subperiosteally elevated to the joint line with the knife and  the semimembranosus bursa with a Cobb elevator.  Soft tissue laterally was elevated with attention being paid to voiding the patellar tendon on tibial tubercle.  I then removed all the excess scar from under the medial aspect of the extensor mechanism, recreate the medial gutter and the superior gutter.  We then did the same laterally.  I also recreated the infrapatellar space as this had scarred down to the tibia.  I was then able to evert the patella without difficulty.  There was a 10-mm polyethylene insert, which was removed.  Once we removed that, I flexed the knee and removed some scar tissue posteriorly.  I then placed a 10- mm trial and still lacked about 15 degrees with full extension.  I felt that the only way to get extension back at this point was to revise the femur and elevate the joint line 3-4 mm.  We then disrupted the interface between the femoral component and bone utilizing osteotomes.  The femoral component was removed without any bone loss.  I removed the cement from the intercondylar area.  We then drilled into the femoral canal distally and thoroughly irrigated the canal to remove fatty contents.  Reamed up to 18 mm, this had an excellent press-fit in the femoral canal.  The 18-mm reamer  was then used as our alignment rod.  A 5-degree right valgus alignment guide was placed, then I placed this and removed 4 mm off the distal femur hoping that this would give [place] the 20 degrees of extension.  The distal femoral resection was made with an oscillating saw.  The size 2 was the most appropriate femoral component.  The size 2 cutting block was placed with the rotation marked off the epicondylar axis.  Anterior and posterior, no bone was taken, but there was bone off the chamfers.  I then placed the intercondylar block and the intercondylar cut made.  A trial of revision femur was placed, which was a 2.5 posterior stabilized femur with an 18 x 75 stem in a neutral  position.  We did not need any augments.  The trial was placed with excellent fit on the cut bone surfaces.  A 10-mm trial insert was placed in full extension, now achieved with excellent varus-valgus and anterior-posterior balance throughout full range of motion.  I was able to flex her back to about 125 degrees with excellent stability.  The patella tracks normally.  We then removed the trials and prepared the cut bone surfaces with pulsatile lavage.  The femoral implant was put together, which again is 18 x 75 stem in a neutral position with a 2.5 posterior stabilized femur.  The cement was then mixed and once ready for implantation, the component was cemented distally with press-fit on the stem.  It was impacted and all extruded cements were removed.  Trial of 10-mm insert was placed, knee held in full extension, and all extruded cement removed.  When the cement was fully hardened, then the permanent 10-mm posterior stabilized rotating platform insert was placed into the tibial tray.  Once again, full extension was achieved with excellent varus- valgus and anterior-posterior balance throughout full range of motion. Patella tracks normally.  The wound was copiously irrigated with saline solution and then 20 mL of Exparel mixed with 30 mL of saline were injected into the periosteum of the femur, the ligaments and the extensor mechanism and the subcu tissues.  An additional 20 mL of 0.25% bupivacaine was injected into the same tissues.  The arthrotomy was then closed over Hemovac drain with a running #1 V-Loc suture.  Flexion against gravity was 130 degrees and the patella tracks normally. Tourniquet was released, total time of 40 minutes.  Minor bleeding stopped with cautery.  Subcu was then closed with interrupted 2-0 Vicryl and the second limb of the drain was placed in the subcu tissues.  The subcuticular layer was closed with running 4-0 Monocryl.  The drain was hooked to suction.   Incision was cleaned and dried, and Steri-Strips and a bulky sterile dressing applied.  She was placed into a knee immobilizer, awakened and transported to recovery in stable condition.  Note that a surgical assistant was a medical necessity for this procedure in order to perform in a safe and expeditious manner. Surgical assistant is necessary for retraction of ligaments and vital neurovascular structures especially with the extent of dissection and needed to be done to remove the scar tissue.  In addition, assistance needed for proper positioning of the knee to allow for safe removal of the primary implant and proper alignment and placement of the revision implant.     Ollen Gross, M.D.    FA/MEDQ  D:  08/27/2013  T:  08/28/2013  Job:  [id number]

## 2013-08-29 NOTE — Discharge Summary (Signed)
Physician Discharge Summary   Patient ID: Brianna Gates MRN: [id number] DOB/AGE: September 24, 1951 62 y.o.  Admit date: 08/27/2013 Discharge date: 08/29/2013  Primary Diagnosis:  Arthrofibrosis, right knee with flexion  contracture.  Admission Diagnoses:  Past Medical History  Diagnosis Date  . Guillain-Barre syndrome   . Hypertension   . Dyslipidemia   . Degenerative arthritis   . Hypothyroidism   . Thyroid nodule   . Cervical spondylosis   . Lumbosacral spondylosis   . Raynaud disease   . GERD (gastroesophageal reflux disease)   . Complication of anesthesia     UNKNOWN REACTION WITH RASH AT SITE OF IV   . Asthma     INFREQUENT PROBLEM  . Eczema   . Anxiety    Discharge Diagnoses:   Principal Problem:   Postoperative stiffness of total knee replacement  Estimated body mass index is 20.42 kg/(m^2) as calculated from the following:   Height as of this encounter: 5\' 4"  (1.626 m).   Weight as of this encounter: 53.978 kg (119 lb).  Procedure:  Procedure(s) (LRB): RIGHT KNEE SCAR EXCISION AND FEMORAL REVISION (Right)   Consults: None  HPI: Brianna Gates is a 62 year old female, who had a right  total knee arthroplasty done several years ago. She had problems with  stiffness in the knee. She has gone on to develop about 15-20 degrees  flexion contracture in addition to limited flexion of about 90 degrees.  This did not respond to close manipulation. She presents now for  arthrotomy, scar excision and possible femoral revision to regain  extension.  Laboratory Data: Admission on 08/27/2013, Discharged on 08/29/2013  Component Date Value Range Status  . WBC 08/28/2013 9.2  4.0 - 10.5 K/uL Final  . RBC 08/28/2013 3.92  3.87 - 5.11 MIL/uL Final  . Hemoglobin 08/28/2013 12.9  12.0 - 15.0 g/dL Final  . HCT 40/98/1191 38.2  36.0 - 46.0 % Final  . MCV 08/28/2013 97.4  78.0 - 100.0 fL Final  . MCH 08/28/2013 32.9  26.0 - 34.0 pg Final  . MCHC 08/28/2013 33.8  30.0 - 36.0 g/dL  Final  . RDW 47/82/9562 13.1  11.5 - 15.5 % Final  . Platelets 08/28/2013 173  150 - 400 K/uL Final  . Sodium 08/28/2013 139  135 - 145 mEq/L Final  . Potassium 08/28/2013 3.9  3.5 - 5.1 mEq/L Final  . Chloride 08/28/2013 103  96 - 112 mEq/L Final  . CO2 08/28/2013 29  19 - 32 mEq/L Final  . Glucose, Bld 08/28/2013 142* 70 - 99 mg/dL Final  . BUN 13/05/6577 11  6 - 23 mg/dL Final  . Creatinine, Ser 08/28/2013 0.56  0.50 - 1.10 mg/dL Final  . Calcium 46/96/2952 9.1  8.4 - 10.5 mg/dL Final  . GFR calc non Af Amer 08/28/2013 >90  >90 mL/min Final  . GFR calc Af Amer 08/28/2013 >90  >90 mL/min Final   Comment: (NOTE)                          The eGFR has been calculated using the CKD EPI equation.                          This calculation has not been validated in all clinical situations.                          eGFR's persistently <90  mL/min signify possible Chronic Kidney                          Disease.  . WBC 08/29/2013 8.2  4.0 - 10.5 K/uL Final  . RBC 08/29/2013 3.76* 3.87 - 5.11 MIL/uL Final  . Hemoglobin 08/29/2013 12.3  12.0 - 15.0 g/dL Final  . HCT 40/98/1191 36.8  36.0 - 46.0 % Final  . MCV 08/29/2013 97.9  78.0 - 100.0 fL Final  . MCH 08/29/2013 32.7  26.0 - 34.0 pg Final  . MCHC 08/29/2013 33.4  30.0 - 36.0 g/dL Final  . RDW 47/82/9562 13.2  11.5 - 15.5 % Final  . Platelets 08/29/2013 153  150 - 400 K/uL Final  . Sodium 08/29/2013 138  135 - 145 mEq/L Final  . Potassium 08/29/2013 3.5  3.5 - 5.1 mEq/L Final  . Chloride 08/29/2013 103  96 - 112 mEq/L Final  . CO2 08/29/2013 27  19 - 32 mEq/L Final  . Glucose, Bld 08/29/2013 105* 70 - 99 mg/dL Final  . BUN 13/05/6577 14  6 - 23 mg/dL Final  . Creatinine, Ser 08/29/2013 0.60  0.50 - 1.10 mg/dL Final  . Calcium 46/96/2952 9.8  8.4 - 10.5 mg/dL Final  . GFR calc non Af Amer 08/29/2013 >90  >90 mL/min Final  . GFR calc Af Amer 08/29/2013 >90  >90 mL/min Final   Comment: (NOTE)                          The eGFR has been  calculated using the CKD EPI equation.                          This calculation has not been validated in all clinical situations.                          eGFR's persistently <90 mL/min signify possible Chronic Kidney                          Disease.  Hospital Outpatient Visit on 08/18/2013  Component Date Value Range Status  . Sodium 08/18/2013 140  135 - 145 mEq/L Final  . Potassium 08/18/2013 4.4  3.5 - 5.1 mEq/L Final  . Chloride 08/18/2013 103  96 - 112 mEq/L Final  . CO2 08/18/2013 30  19 - 32 mEq/L Final  . Glucose, Bld 08/18/2013 71  70 - 99 mg/dL Final  . BUN 84/13/2440 21  6 - 23 mg/dL Final  . Creatinine, Ser 08/18/2013 0.70  0.50 - 1.10 mg/dL Final  . Calcium 08/12/2535 9.8  8.4 - 10.5 mg/dL Final  . GFR calc non Af Amer 08/18/2013 >90  >90 mL/min Final  . GFR calc Af Amer 08/18/2013 >90  >90 mL/min Final   Comment: (NOTE)                          The eGFR has been calculated using the CKD EPI equation.                          This calculation has not been validated in all clinical situations.  eGFR's persistently <90 mL/min signify possible Chronic Kidney                          Disease.  . WBC 08/18/2013 5.0  4.0 - 10.5 K/uL Final  . RBC 08/18/2013 4.68  3.87 - 5.11 MIL/uL Final  . Hemoglobin 08/18/2013 14.9  12.0 - 15.0 g/dL Final  . HCT 64/40/3474 46.3* 36.0 - 46.0 % Final  . MCV 08/18/2013 98.9  78.0 - 100.0 fL Final  . MCH 08/18/2013 31.8  26.0 - 34.0 pg Final  . MCHC 08/18/2013 32.2  30.0 - 36.0 g/dL Final  . RDW 25/95/6387 13.2  11.5 - 15.5 % Final  . Platelets 08/18/2013 170  150 - 400 K/uL Final     X-Rays:Dg Chest 2 View  08/18/2013   CLINICAL DATA:  Preoperative evaluation for right knee surgery, hypertension, dyslipidemia, asthma  EXAM: CHEST  2 VIEW  COMPARISON:  07/26/2011  FINDINGS: Normal heart size, mediastinal contours, and pulmonary vascularity.  Peribronchial thickening and increased pulmonary hyperinflation.  No acute  infiltrate, pleural effusion, or pneumothorax.  Minimal scattered endplate spur formation thoracic spine.  IMPRESSION: Peribronchial thickening and increased hyperinflation likely related to history of asthma.  No acute infiltrate.   Electronically Signed   By: Ulyses Southward M.D.   On: 08/18/2013 14:09    EKG: Orders placed during the hospital encounter of 08/27/13  . EKG     Hospital Course: Brianna Gates is a 62 y.o. who was admitted to [facility] They were brought to the operating room on 08/27/2013 and underwent Procedure(s): RIGHT KNEE SCAR EXCISION AND FEMORAL REVISION.  Patient tolerated the procedure well and was later transferred to the recovery room and then to the orthopaedic floor for postoperative care.  They were given PO and IV analgesics for pain control following their surgery.  They were given 24 hours of postoperative antibiotics of  Anti-infectives   Start     Dose/Rate Route Frequency Ordered Stop   08/27/13 1045  ceFAZolin (ANCEF) IVPB 2 g/50 mL premix     2 g 100 mL/hr over 30 Minutes Intravenous On call to O.R. 08/27/13 1040 08/27/13 1400     and started on DVT prophylaxis in the form of Xarelto.   PT and OT were ordered for total joint protocol.  Discharge planning consulted to help with postop disposition and equipment needs.  Patient had a decent night on the evening of surgery.  They started to get up OOB with therapy on day one. Hemovac drain was pulled without difficulty.  Continued to work with therapy into day two.  Dressing was changed on day two and the incision was healing well.  Patient was seen in rounds and was ready to go home.   Discharge Medications: Prior to Admission medications   Medication Sig Start Date End Date Taking? Authorizing Provider  acetaminophen (TYLENOL) 325 MG tablet Take 650 mg by mouth every 6 (six) hours as needed for pain.   Yes Historical Provider, MD  albuterol (PROVENTIL HFA;VENTOLIN HFA) 108 (90 BASE) MCG/ACT  inhaler Inhale 1 puff into the lungs every 6 (six) hours as needed for wheezing.   Yes Historical Provider, MD  amLODipine (NORVASC) 5 MG tablet Take 5 mg by mouth daily as needed (blood pressure). Take as needed   Yes Historical Provider, MD  buPROPion (WELLBUTRIN SR) 150 MG 12 hr tablet Take 150 mg by mouth every morning.    Yes  Historical Provider, MD  celecoxib (CELEBREX) 200 MG capsule Take 200 mg by mouth daily.    Yes Historical Provider, MD  cetirizine (ZYRTEC ALLERGY) 10 MG tablet Take 10 mg by mouth daily.   Yes Historical Provider, MD  gabapentin (NEURONTIN) 400 MG capsule Take 400 mg by mouth 3 (three) times daily.   Yes Historical Provider, MD  levothyroxine (SYNTHROID) 50 MCG tablet Take 50 mcg by mouth daily before breakfast.    Yes Historical Provider, MD  losartan (COZAAR) 50 MG tablet Take 50 mg by mouth daily. CURRENTLY NOT TAKING   Yes Historical Provider, MD  pantoprazole (PROTONIX) 40 MG tablet Take 40 mg by mouth daily. 07/17/13  Yes Historical Provider, MD  simvastatin (ZOCOR) 20 MG tablet Take 20 mg by mouth at bedtime.    Yes Historical Provider, MD  methocarbamol (ROBAXIN) 500 MG tablet Take 1 tablet (500 mg total) by mouth every 6 (six) hours as needed for muscle spasms. 08/29/13   Alexzandrew Perkins, PA-C  oxyCODONE (OXY IR/ROXICODONE) 5 MG immediate release tablet Take 1-2 tablets (5-10 mg total) by mouth every 3 (three) hours as needed for moderate pain or severe pain. 08/29/13   Alexzandrew Julien Girt, PA-C  rivaroxaban (XARELTO) 10 MG TABS tablet Take 1 tablet (10 mg total) by mouth daily with breakfast. Take Xarelto for two and a half more weeks, then discontinue Xarelto. Once the patient has completed the Xarelto, they may resume the 81 mg Aspirin and the Plaquenil 200 mg at home. 08/29/13   Alexzandrew Perkins, PA-C  traMADol (ULTRAM) 50 MG tablet Take 1-2 tablets (50-100 mg total) by mouth every 6 (six) hours as needed (mild pain). 08/29/13   Alexzandrew Julien Girt, PA-C     Discharge home with home health and HOME CPM  Diet - Cardiac diet  Follow up - in 2 weeks  Activity - WBAT  Disposition - Home  Condition Upon Discharge - Good  D/C Meds - See DC Summary  DVT Prophylaxis - Xarelto        Discharge Orders   Future Appointments Provider Department Dept Phone   07/22/2014 11:00 AM York Spaniel, MD [facility] [phone]   Future Orders Complete By Expires   Call MD / Call 911  As directed    Comments:     If you experience chest pain or shortness of breath, CALL 911 and be transported to the hospital emergency room.  If you develope a fever above 101 F, pus (white drainage) or increased drainage or redness at the wound, or calf pain, call your surgeon's office.   Change dressing  As directed    Comments:     Change dressing daily with sterile 4 x 4 inch gauze dressing and apply TED hose. Do not submerge the incision under water.   Constipation Prevention  As directed    Comments:     Drink plenty of fluids.  Prune juice may be helpful.  You may use a stool softener, such as Colace (over the counter) 100 mg twice a day.  Use MiraLax (over the counter) for constipation as needed.   CPM  As directed    Comments:     Continuous passive motion machine (CPM):      Use the CPM from 0 degrees to 50 degrees for 6 hours per day.      You may increase by 5 degrees per day.  Break it up into 3 sessions per day and two hour sessions.  Use CPM for 2-3 weeks or until you are told to stop.   Diet - low sodium heart healthy  As directed    Discharge instructions  As directed    Comments:     Pick up stool softner and laxative for home. Do not submerge incision under water. May shower. Continue to use ice for pain and swelling from surgery.  Take Xarelto for two and a half more weeks, then discontinue Xarelto. Once the patient has completed the Xarelto, they may resume the 81 mg Aspirin and the Plaquenil 200 mg.   Do not put a  pillow under the knee. Place it under the heel.  As directed    Do not sit on low chairs, stoools or toilet seats, as it may be difficult to get up from low surfaces  As directed    Driving restrictions  As directed    Comments:     No driving until released by the physician.   Increase activity slowly as tolerated  As directed    Lifting restrictions  As directed    Comments:     No lifting until released by the physician.   Patient may shower  As directed    Comments:     You may shower without a dressing once there is no drainage.  Do not wash over the wound.  If drainage remains, do not shower until drainage stops.   TED hose  As directed    Comments:     Use stockings (TED hose) for 3 weeks on both leg(s).  You may remove them at night for sleeping.   Weight bearing as tolerated  As directed    Questions:     Laterality:     Extremity:         Medication List    STOP taking these medications       aspirin 81 MG tablet     [employer] VITAMIN E 400 UNIT capsule  Generic drug:  vitamin E     Estradiol 0.75 MG/1.25 GM (0.06%) topical gel     HYDROcodone-acetaminophen 10-325 MG per tablet  Commonly known as:  NORCO     hydroxychloroquine 200 MG tablet  Commonly known as:  PLAQUENIL     Vitamin D 2000 UNITS Caps      TAKE these medications       acetaminophen 325 MG tablet  Commonly known as:  TYLENOL  Take 650 mg by mouth every 6 (six) hours as needed for pain.     albuterol 108 (90 BASE) MCG/ACT inhaler  Commonly known as:  PROVENTIL HFA;VENTOLIN HFA  Inhale 1 puff into the lungs every 6 (six) hours as needed for wheezing.     buPROPion 150 MG 12 hr tablet  Commonly known as:  WELLBUTRIN SR  Take 150 mg by mouth every morning.     celecoxib 200 MG capsule  Commonly known as:  CELEBREX  Take 200 mg by mouth daily.     gabapentin 400 MG capsule  Commonly known as:  NEURONTIN  Take 400 mg by mouth 3 (three) times daily.     losartan 50 MG tablet  Commonly known  as:  COZAAR  Take 50 mg by mouth daily. CURRENTLY NOT TAKING     methocarbamol 500 MG tablet  Commonly known as:  ROBAXIN  Take 1 tablet (500 mg total) by mouth every 6 (six) hours as needed for muscle spasms.     NORVASC 5 MG tablet  Generic  drug:  amLODipine  Take 5 mg by mouth daily as needed (blood pressure). Take as needed     oxyCODONE 5 MG immediate release tablet  Commonly known as:  Oxy IR/ROXICODONE  Take 1-2 tablets (5-10 mg total) by mouth every 3 (three) hours as needed for moderate pain or severe pain.     pantoprazole 40 MG tablet  Commonly known as:  PROTONIX  Take 40 mg by mouth daily.     rivaroxaban 10 MG Tabs tablet  Commonly known as:  XARELTO  - Take 1 tablet (10 mg total) by mouth daily with breakfast. Take Xarelto for two and a half more weeks, then discontinue Xarelto.  - Once the patient has completed the Xarelto, they may resume the 81 mg Aspirin and the Plaquenil 200 mg at home.     simvastatin 20 MG tablet  Commonly known as:  ZOCOR  Take 20 mg by mouth at bedtime.     SYNTHROID 50 MCG tablet  Generic drug:  levothyroxine  Take 50 mcg by mouth daily before breakfast.     traMADol 50 MG tablet  Commonly known as:  ULTRAM  Take 1-2 tablets (50-100 mg total) by mouth every 6 (six) hours as needed (mild pain).     ZYRTEC ALLERGY 10 MG tablet  Generic drug:  cetirizine  Take 10 mg by mouth daily.       Follow-up Information   Follow up with Loanne Drilling, MD. Schedule an appointment as soon as possible for a visit on 09/09/2013. (Call [phone] this afternoon or Monday to make the appointment)    Specialty:  Orthopedic Surgery   Contact information:   [address] [place] [address] [phone]       Signed: Patrica Duel 09/18/2013, 1:44 PM

## 2013-11-21 ENCOUNTER — Telehealth: Payer: Self-pay | Admitting: Neurology

## 2013-11-21 MED ORDER — GABAPENTIN 400 MG PO CAPS: 400.0000 mg | ORAL_CAPSULE | Freq: Three times a day (TID) | ORAL | Status: DC

## 2013-11-21 NOTE — Telephone Encounter (Signed)
Patient calling needing refill of gabapentin called to [facility] [phone].

## 2013-11-21 NOTE — Telephone Encounter (Signed)
Rx has been sent  

## 2014-05-15 ENCOUNTER — Telehealth: Payer: Self-pay | Admitting: Neurology

## 2014-05-15 NOTE — Telephone Encounter (Signed)
Patient calling to ask whether it is ok for her to get her tetanus booster and the shingles shot. Please return call to patient and advise.

## 2014-05-15 NOTE — Telephone Encounter (Signed)
I called patient. There is no contraindication for getting the tetanus booster or the shingles shot with a prior history of Guillain-Barr syndrome. I discussed this with her.

## 2014-06-03 ENCOUNTER — Other Ambulatory Visit: Payer: Self-pay | Admitting: Dermatology

## 2014-06-11 ENCOUNTER — Ambulatory Visit (INDEPENDENT_AMBULATORY_CARE_PROVIDER_SITE_OTHER): Payer: 59 | Admitting: Podiatry

## 2014-06-11 ENCOUNTER — Other Ambulatory Visit: Payer: Self-pay | Admitting: Internal Medicine

## 2014-06-11 ENCOUNTER — Encounter: Payer: Self-pay | Admitting: Podiatry

## 2014-06-11 ENCOUNTER — Ambulatory Visit (INDEPENDENT_AMBULATORY_CARE_PROVIDER_SITE_OTHER): Payer: 59

## 2014-06-11 ENCOUNTER — Ambulatory Visit
Admission: RE | Admit: 2014-06-11 | Discharge: 2014-06-11 | Disposition: A | Discharge status: Home / Self Care | Payer: 59 | Source: Ambulatory Visit | Attending: Internal Medicine | Admitting: Internal Medicine

## 2014-06-11 VITALS — Ht 64.0 in | Wt 120.0 lb

## 2014-06-11 DIAGNOSIS — M542 Cervicalgia: Secondary | ICD-10-CM

## 2014-06-11 DIAGNOSIS — M779 Enthesopathy, unspecified: Secondary | ICD-10-CM

## 2014-06-11 DIAGNOSIS — M2040 Other hammer toe(s) (acquired), unspecified foot: Secondary | ICD-10-CM

## 2014-06-11 DIAGNOSIS — L84 Corns and callosities: Secondary | ICD-10-CM

## 2014-06-11 MED ORDER — TRIAMCINOLONE ACETONIDE 10 MG/ML IJ SUSP
10.0000 mg | Freq: Once | INTRAMUSCULAR | Status: AC
  Administered 2014-06-11: 10 mg

## 2014-06-11 NOTE — Progress Notes (Signed)
   Subjective:    Patient ID: Brianna Gates, female    DOB: Dec 07, 1950, 63 y.o.   MRN: [id number]  Foot Pain      Review of Systems     Objective:   Physical Exam        Assessment & Plan:

## 2014-06-11 NOTE — Progress Notes (Signed)
Subjective:     Patient ID: Brianna Gates, female   DOB: 31-Aug-1951, 63 y.o.   MRN: [id number]  HPI patient presents with a heart not on the bottom of the left first metatarsal with fluid buildup and pain. Also complains of some elevation of the big toe left that can become painful   Review of Systems     Objective:   Physical Exam Neurovascular status unchanged with muscle strength adequate and range of motion subtalar midtarsal joint within normal limits. Patient's plantar left first metatarsal has keratotic tissue formation and fluid buildup and it does appear to have some plantar flexion of the metatarsal bone with some hammering of the big toe itself    Assessment:     Problem that is inflammatory with capsulitis but also a structural which I explained to the patient related to the position of the bone    Plan:     H&P and condition reviewed. At this time I did do a plantar injection 3 mg Kenalog 5 mg Xylocaine and I did deep debridement of lesion and we'll see the results of this. May consider orthotics or other treatment and possibly long-term lifting of the bone with possible digital fusion

## 2014-07-21 ENCOUNTER — Telehealth: Payer: Self-pay | Admitting: *Deleted

## 2014-07-21 NOTE — Telephone Encounter (Signed)
This is in reference to my doctor's appointment in September.  I saw Dr. Paulla Dolly.  They took an image of my foot, I was looking at the information, it says [facility] right.  What I came in for is my left foot.  I'm just making sure that you all took an image of my left foot.  Even though he had injected in my left foot, which is no longer working at all.  It must be a bone or something going on inside there besides a callus.  I just want to make.  I do come in to see Dr. Paulla Dolly in a few weeks.  If you all could make sure you took x-rays of the left foot, let me know.  Thank you.  I called and informed her that x-rays were taken of the left foot.  She stated, "I just wanted to make sure, thanks for calling.  I'll see you in a couple of weeks."

## 2014-07-22 ENCOUNTER — Ambulatory Visit (INDEPENDENT_AMBULATORY_CARE_PROVIDER_SITE_OTHER): Payer: 59 | Admitting: Neurology

## 2014-07-22 ENCOUNTER — Encounter (INDEPENDENT_AMBULATORY_CARE_PROVIDER_SITE_OTHER): Payer: Self-pay

## 2014-07-22 ENCOUNTER — Encounter: Payer: Self-pay | Admitting: Neurology

## 2014-07-22 VITALS — BP 123/67 | HR 64 | Ht 64.0 in | Wt 125.5 lb

## 2014-07-22 DIAGNOSIS — Z8669 Personal history of other diseases of the nervous system and sense organs: Secondary | ICD-10-CM

## 2014-07-22 DIAGNOSIS — R269 Unspecified abnormalities of gait and mobility: Secondary | ICD-10-CM

## 2014-07-22 DIAGNOSIS — S161XXA Strain of muscle, fascia and tendon at neck level, initial encounter: Secondary | ICD-10-CM | POA: Insufficient documentation

## 2014-07-22 HISTORY — DX: Strain of muscle, fascia and tendon at neck level, initial encounter: S16.1XXA

## 2014-07-22 HISTORY — DX: Personal history of other diseases of the nervous system and sense organs: Z86.69

## 2014-07-22 MED ORDER — METHOCARBAMOL 500 MG PO TABS
500.0000 mg | ORAL_TABLET | Freq: Four times a day (QID) | ORAL | Status: DC
Start: 2014-07-22 — End: 2014-07-22

## 2014-07-22 MED ORDER — CELECOXIB 200 MG PO CAPS: 200.0000 mg | ORAL_CAPSULE | Freq: Two times a day (BID) | ORAL | Status: DC

## 2014-07-22 MED ORDER — GABAPENTIN 400 MG PO CAPS: 400.0000 mg | ORAL_CAPSULE | Freq: Three times a day (TID) | ORAL | Status: DC

## 2014-07-22 NOTE — Progress Notes (Signed)
Reason for visit: Guillain-Barr syndrome  Brianna Gates is an 63 y.o. 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 The patient has been doing quite well, she still has some residual numbness of the feet. The patient does have some mild gait instability, and she fell 1 time 2 weeks ago, but she generally does not fall. The patient had an episode of vertigo in December 2014, without recurrence. In the spring of 2015, she has developed some neck stiffness, with discomfort in the occipital area of the head, and some pain into the medial aspect of the left shoulder blade. The patient also reports some pain in the ball of the foot on the left associated with weightbearing, and occasional cramping of the toes. The patient has been seen by Dr. Paulla Dolly from podiatry. An injection in the foot helped for about one month. The patient returns to the office today for an evaluation.  Past Medical History  Diagnosis Date  . Guillain-Barre syndrome   . Hypertension   . Dyslipidemia   . Degenerative arthritis   . Hypothyroidism   . Thyroid nodule   . Cervical spondylosis   . Lumbosacral spondylosis   . Raynaud disease   . GERD (gastroesophageal reflux disease)   . Complication of anesthesia     UNKNOWN REACTION WITH RASH AT SITE OF IV   . Asthma     INFREQUENT PROBLEM  . Eczema   . Anxiety   . History of Guillain-Barre syndrome 07/22/2014  . Cervical strain 07/22/2014    Past Surgical History  Procedure Laterality Date  . Abdominal hysterectomy    . Tonsillectomy    . Strabismus surgery Right   . Replacement total knee bilateral Bilateral   . Ganglion cyst excision Left     Wrist  . Hernia repair    . Knee arthroscopy      RT KNEE  . Knee arthrotomy Right 08/27/2013    Procedure: RIGHT KNEE SCAR EXCISION AND FEMORAL REVISION;  Surgeon: Gearlean Alf, MD;  Location: [facility];  Service: Orthopedics;  Laterality: Right;    Family History  Problem Relation Age of Onset  . Hypertension Mother   . Other Mother     Dyslipidemia  . Heart disease Father   . Other Brother     Dyslipidemia    Social history:  reports that she has never smoked. She has never used smokeless tobacco. She reports that she drinks about 1.7 ounces of alcohol per week. She reports that she does not use illicit drugs.    Allergies  Allergen Reactions  . Altace [Ramipril] Cough  . Captopril   . Daypro [Oxaprozin] Hives  . Zoloft [Sertraline Hcl] Diarrhea  . Influenza Vaccines     HX   . Sulfa Antibiotics Other (See Comments)    unknown    Medications:  Current Outpatient Prescriptions on File Prior to Visit  Medication Sig Dispense Refill  . aspirin 81 MG tablet Take 81 mg by mouth daily.      . BusPIRone HCl (BUSPAR PO) Take by mouth daily.      . cetirizine (ZYRTEC ALLERGY) 10 MG tablet Take 10 mg by mouth daily.      . DULoxetine (CYMBALTA) 60 MG capsule Take 60 mg by mouth daily.      . Estradiol (ESTROGEL TD) Place onto the skin  daily.      . levothyroxine (SYNTHROID) 50 MCG tablet Take 50 mcg by mouth daily before breakfast.       . losartan (COZAAR) 50 MG tablet Take 50 mg by mouth daily. CURRENTLY NOT TAKING      . pantoprazole (PROTONIX) 40 MG tablet Take 40 mg by mouth daily.      . rosuvastatin (CRESTOR) 5 MG tablet Take 5 mg by mouth daily.       No current facility-administered medications on file prior to visit.    ROS:  Out of a complete 14 system review of symptoms, the patient complains only of the following symptoms, and all other reviewed systems are negative.  Eye itching, light sensitivity Difficulty swallowing Cold intolerance, heat intolerance Frequent waking, daytime sleepiness Environmental allergies Joint pain, back pain, achy muscles, muscle cramps, neck pain, neck stiffness Itching Dizziness Anxiety  Blood  pressure 123/67, pulse 64, height 5\' 4"  (1.626 m), weight 125 lb 8 oz (56.926 kg).  Physical Exam  General: The patient is alert and cooperative at the time of the examination.  Skin: No significant peripheral edema is noted.   Neurologic Exam  Mental status: The patient is oriented x 3.  Cranial nerves: Facial symmetry is present. Speech is normal, no aphasia or dysarthria is noted. Extraocular movements are full. Visual fields are full.  Motor: The patient has good strength in all 4 extremities. The patient is able to walk on heels and the toes bilaterally.  Sensory examination: Soft touch sensation is symmetric on the face, arms, and legs.  Coordination: The patient has good finger-nose-finger and heel-to-shin bilaterally.  Gait and station: The patient has a normal gait. Tandem gait is very minimally unsteady. Romberg is negative. No drift is seen.  Reflexes: Deep tendon reflexes are symmetric, but are depressed.   Assessment/Plan:  One. History of Guillain-Barr syndrome  2. Cervical strain syndrome  The patient will be set up for neuromuscular therapy on the cervical spine. She will be placed on Robaxin taking 500 mg 3 times a day over the next month. The patient is not improving, MRI of the cervical spine can be done in the future. She will followup in 4 months. Prescriptions were written for Celebrex, Robaxin, and gabapentin.  Jill Alexanders MD 07/22/2014 10:13 PM  [facility] [address] [place], [place] [address]  Phone [phone] Fax [phone]

## 2014-07-22 NOTE — Patient Instructions (Signed)

## 2014-07-23 ENCOUNTER — Ambulatory Visit: Payer: 59 | Attending: Neurology | Admitting: Physical Therapy

## 2014-07-23 DIAGNOSIS — G629 Polyneuropathy, unspecified: Secondary | ICD-10-CM | POA: Diagnosis not present

## 2014-07-23 DIAGNOSIS — Z5189 Encounter for other specified aftercare: Secondary | ICD-10-CM | POA: Diagnosis present

## 2014-07-23 DIAGNOSIS — Z96659 Presence of unspecified artificial knee joint: Secondary | ICD-10-CM | POA: Diagnosis not present

## 2014-07-23 DIAGNOSIS — M542 Cervicalgia: Secondary | ICD-10-CM | POA: Diagnosis not present

## 2014-07-23 DIAGNOSIS — M47812 Spondylosis without myelopathy or radiculopathy, cervical region: Secondary | ICD-10-CM | POA: Insufficient documentation

## 2014-08-13 ENCOUNTER — Ambulatory Visit (INDEPENDENT_AMBULATORY_CARE_PROVIDER_SITE_OTHER): Payer: 59 | Admitting: Podiatry

## 2014-08-13 ENCOUNTER — Encounter: Payer: Self-pay | Admitting: Podiatry

## 2014-08-13 DIAGNOSIS — M216X9 Other acquired deformities of unspecified foot: Secondary | ICD-10-CM

## 2014-08-13 DIAGNOSIS — M2032 Hallux varus (acquired), left foot: Secondary | ICD-10-CM

## 2014-08-13 DIAGNOSIS — M2042 Other hammer toe(s) (acquired), left foot: Secondary | ICD-10-CM

## 2014-08-13 DIAGNOSIS — Q667 Congenital pes cavus: Secondary | ICD-10-CM

## 2014-08-14 ENCOUNTER — Ambulatory Visit (INDEPENDENT_AMBULATORY_CARE_PROVIDER_SITE_OTHER): Payer: 59 | Admitting: Podiatry

## 2014-08-14 ENCOUNTER — Encounter: Payer: Self-pay | Admitting: Podiatry

## 2014-08-14 VITALS — BP 123/67 | HR 74 | Resp 16

## 2014-08-14 DIAGNOSIS — Q667 Congenital pes cavus: Secondary | ICD-10-CM

## 2014-08-14 DIAGNOSIS — M2032 Hallux varus (acquired), left foot: Secondary | ICD-10-CM

## 2014-08-14 DIAGNOSIS — M216X9 Other acquired deformities of unspecified foot: Secondary | ICD-10-CM

## 2014-08-14 DIAGNOSIS — M2042 Other hammer toe(s) (acquired), left foot: Secondary | ICD-10-CM

## 2014-08-14 DIAGNOSIS — L84 Corns and callosities: Secondary | ICD-10-CM

## 2014-08-17 NOTE — Progress Notes (Signed)
Patient ID: Brianna Gates, female   DOB: 27-Jun-1951, 63 y.o.   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 She states that she has always had a "high-arch" foot type. She states that most recently she has started to have skin break down on the left foot submetatarsal one. She is also having more difficulty with shoegear. She presents today in a flat style shoe. She does not wear a sneaker style shoe often and has not had orthotics. She had Guillain-barre syndrome a few years ago. She is unsure of what started this, but she believes it was from the flu shot. She also has a history of raynaud's disease and notes chronic discoloration of the skin in the feet and hands and they often feel cool. She has no other complaints at this time.   Objective: AAO x3, NAD DP/PT pulses palpable bilaterally, CRT less than 3 seconds. There is chronic light purple discoloration to the feet b/l (this is not new per the patient and she has previously been diagnosed with Raynaud's). 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 Calcaneus is in varus with a rigidly flexed first metatarsal. Rigid contracture of the hallux IPJ in flexion. There is hyperkeratotic lesions submetatarsal 1/5. There is no specific areas of pinpoint bony tenderness or pain with vibratory sensation. Pre-ulcerative callus left foot submetatarsal 1. 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 -Conservative versus surgical treatment discussed including alternatives,  risks, complications. -This time the patient has not attempted complete conservative therapy including orthotics to help offload her foot type. I discussed with her various surgical interventions which would include dorsiflexory wedge osteotomy of the first metatarsal with hallux IPJ fusion vs. a complete reconstruction. I discussed with her that after surgical intervention she would likely still need orthotics, especially if we only did the DFWO. Her goal of the surgery is to not have to wear an insert, but given her foot type, I discussed she would still likely need it. I also discussed with her appropriate shoegear given the deformity. I am also hesitant to preform surgery given the history of raynauds. If she did proceed with surgery, would need vascular evaluation.  -At today's appointment, she was scanned for orthotics and sent to [facility] to help offload submetatarsal 1 b/l to help prevent any future skin breakdown.  -Follow up after the orthotics arrive, or sooner if any problems arise. Continue to monitor for any skin breakdown to the left submetatasral 1 area. If there is any skin breakdown, call the office immediately.

## 2014-08-17 NOTE — Progress Notes (Signed)
Subjective:     Patient ID: Brianna Gates, female   DOB: July 29, 1951, 63 y.o.   MRN: [id number]  HPI patient presents stating I didn't get relief from the medication or the trimming and I'm still having pain   Review of Systems     Objective:   Physical Exam Neurovascular status intact with muscle strength adequate in range of motion within normal limits. Patient has a fixed cavus deformity left over right with plantar flexed first metatarsal and pain around the first metatarsal head  and keratotic lesion formation. Patient is noted to have equinus condition and moderate deformity of the hallux left over right    Assessment:     Fixed deformity of the left foot with plantarflexed first metatarsal creating pressure against the head of the bone creating lesion formation and pre-ulcerative type callus    Plan:     Reviewed condition and at this point I'm going to send to Dr. Jacqualyn Posey for second opinion. I do think she will require elevating osteotomy possible gastroc recession or TAL and possible sesamoidectomy or hallux fusion. I think an orthotic is a viable short-term option and I reviewed this with her and we will make that decision after she sees Dr. Jacqualyn Posey

## 2014-09-02 ENCOUNTER — Encounter: Payer: Self-pay | Admitting: Neurology

## 2014-09-08 ENCOUNTER — Encounter: Payer: Self-pay | Admitting: Neurology

## 2014-09-25 ENCOUNTER — Ambulatory Visit: Payer: 59

## 2014-09-25 DIAGNOSIS — M216X9 Other acquired deformities of unspecified foot: Secondary | ICD-10-CM

## 2014-09-25 NOTE — Progress Notes (Signed)
Pt is here to PUO 

## 2014-09-25 NOTE — Patient Instructions (Signed)

## 2014-10-19 ENCOUNTER — Other Ambulatory Visit: Payer: Self-pay | Admitting: Obstetrics and Gynecology

## 2014-10-20 LAB — CYTOLOGY - PAP

## 2014-10-28 ENCOUNTER — Other Ambulatory Visit: Payer: Self-pay | Admitting: Internal Medicine

## 2014-10-28 DIAGNOSIS — K769 Liver disease, unspecified: Secondary | ICD-10-CM

## 2014-11-02 ENCOUNTER — Ambulatory Visit
Admission: RE | Admit: 2014-11-02 | Discharge: 2014-11-02 | Disposition: A | Discharge status: Home / Self Care | Payer: 59 | Source: Ambulatory Visit | Attending: Internal Medicine | Admitting: Internal Medicine

## 2014-11-02 DIAGNOSIS — K769 Liver disease, unspecified: Secondary | ICD-10-CM

## 2014-11-24 ENCOUNTER — Telehealth: Payer: Self-pay | Admitting: *Deleted

## 2014-11-24 NOTE — Telephone Encounter (Signed)
Pt states she has her orthotic and they don't fit in a lot of different shoes and really have not helped the protruding bone and callous problem.  I encouraged pt to make an appt to see Dr. Jacqualyn Posey, pt agreed.  I transferred to schedulers.

## 2014-11-25 ENCOUNTER — Encounter: Payer: Self-pay | Admitting: Neurology

## 2014-11-25 ENCOUNTER — Ambulatory Visit (INDEPENDENT_AMBULATORY_CARE_PROVIDER_SITE_OTHER): Payer: 59 | Admitting: Neurology

## 2014-11-25 VITALS — BP 107/62 | HR 71 | Ht 64.0 in | Wt 127.4 lb

## 2014-11-25 DIAGNOSIS — M47812 Spondylosis without myelopathy or radiculopathy, cervical region: Secondary | ICD-10-CM

## 2014-11-25 DIAGNOSIS — Z8669 Personal history of other diseases of the nervous system and sense organs: Secondary | ICD-10-CM

## 2014-11-25 NOTE — Patient Instructions (Signed)

## 2014-11-25 NOTE — Progress Notes (Signed)
Reason for visit: Cervical spondylosis  Brianna Gates is an 64 y.o. female  History of present illness:  Brianna Gates is a 64 year old right-handed white female with a history of Guillain-Barr syndrome with a good recovery. The patient has recently had some neck discomfort, and some discomfort into the left shoulder. She has been seen through [place], and she had MRI evaluation of the cervical and thoracic spine. The cervical spine revealed evidence of facet joint arthritis at the C4-5 level, C5-6 level, and C7-T1 level. 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 The patient has been seen by Dr. Nelva Bush, and an epidural steroid injection was recommended, and they are trying to get insurance approval at this point. The thoracic spine MRI showed some mild disc bulges, without significant spondylosis. The patient has also noted some difficulty with abduction of the left arm, some crepitus in the left shoulder. She has had some cramping of the feet bilaterally. She has had episodes of feeling hot and cold off and on. She is on magnesium supplementation for the foot cramps which are daily in nature. She returns this office for an evaluation.  Past Medical History  Diagnosis Date  . Guillain-Barre syndrome   . Hypertension   . Dyslipidemia   . Degenerative arthritis   . Hypothyroidism   . Thyroid nodule   . Cervical spondylosis   . Lumbosacral spondylosis   . Raynaud disease   . GERD (gastroesophageal reflux disease)   . Complication of anesthesia     UNKNOWN REACTION WITH RASH AT SITE OF IV   . Asthma     INFREQUENT PROBLEM  . Eczema   . Anxiety   . History of Guillain-Barre syndrome 07/22/2014  . Cervical strain 07/22/2014    Past Surgical History  Procedure Laterality Date  . Abdominal hysterectomy    . Tonsillectomy    . Strabismus surgery Right     . Replacement total knee bilateral Bilateral   . Ganglion cyst excision Left     Wrist  . Hernia repair    . Knee arthroscopy      RT KNEE  . Knee arthrotomy Right 08/27/2013    Procedure: RIGHT KNEE SCAR EXCISION AND FEMORAL REVISION;  Surgeon: Gearlean Alf, MD;  Location: [facility];  Service: Orthopedics;  Laterality: Right;    Family History  Problem Relation Age of Onset  . Hypertension Mother   . Other Mother     Dyslipidemia  . Heart disease Father   . Other Brother     Dyslipidemia    Social history:  reports that she has never smoked. She has never used smokeless tobacco. She reports that she drinks about 1.7 oz of alcohol per week. She reports that she does not use illicit drugs.    Allergies  Allergen Reactions  . Altace [Ramipril] Cough  . Buspar [Buspirone] Itching    Vivid dreams, diarrhea   . Captopril   . Daypro [Oxaprozin] Hives  . Zoloft [Sertraline Hcl] Diarrhea  . Influenza Vaccines     HX   . Sulfa Antibiotics Other (See Comments)    unknown    Medications:  Current Outpatient Prescriptions on File Prior to Visit  Medication Sig Dispense Refill  . aspirin 81 MG tablet Take 81 mg by mouth daily.    . celecoxib (CELEBREX) 200 MG capsule Take 1 capsule (200 mg total) by  mouth 2 (two) times daily. (Patient taking differently: Take 200 mg by mouth daily. ) 60 capsule 2  . cetirizine (ZYRTEC ALLERGY) 10 MG tablet Take 10 mg by mouth daily.    . Cholecalciferol (VITAMIN D) 2000 UNITS CAPS Take 1 capsule by mouth daily.    . DULoxetine (CYMBALTA) 60 MG capsule Take 60 mg by mouth daily.    . Estradiol (ESTROGEL TD) Place onto the skin daily.    Marland Kitchen gabapentin (NEURONTIN) 400 MG capsule Take 1 capsule (400 mg total) by mouth 3 (three) times daily. 90 capsule 7  . levothyroxine (SYNTHROID) 50 MCG tablet Take 50 mcg by mouth daily before breakfast.     . losartan (COZAAR) 50 MG tablet Take 50 mg by mouth daily. CURRENTLY NOT TAKING    . pantoprazole  (PROTONIX) 40 MG tablet Take 40 mg by mouth daily.    . rosuvastatin (CRESTOR) 5 MG tablet Take 5 mg by mouth daily.     No current facility-administered medications on file prior to visit.    ROS:  Out of a complete 14 system review of symptoms, the patient complains only of the following symptoms, and all other reviewed systems are negative.  Difficulty swallowing pills Eye discharge Heat and cold intolerance Environmental allergies Joint pain, back pain, achy muscles, muscle cramps, neck pain, neck stiffness Itching Weakness Anxiety  Blood pressure 107/62, pulse 71, height 5\' 4"  (1.626 m), weight 127 lb 6.4 oz (57.788 kg).  Physical Exam  General: The patient is alert and cooperative at the time of the examination.  Neuromuscular: The patient lacks about 20 of lateral rotation of the head to the left, 15 to the right.  Skin: No significant peripheral edema is noted.   Neurologic Exam  Mental status: The patient is oriented x 3.  Cranial nerves: Facial symmetry is present. Speech is normal, no aphasia or dysarthria is noted. Extraocular movements are full. Visual fields are full.  Motor: The patient has good strength in all 4 extremities, with exception the patient has some difficulty with abduction of the left arm, but good strength with external rotation of the left arm.  Sensory examination: Soft touch sensation is symmetric on the face, arms, and legs.  Coordination: The patient has good finger-nose-finger and heel-to-shin bilaterally.  Gait and station: The patient has a normal gait. Tandem gait is normal. Romberg is negative. No drift is seen.  Reflexes: Deep tendon reflexes are symmetric.   Assessment/Plan:  1. Cervical spondylosis  2. History of Guillain-Barr syndrome  The patient may gain benefit from the epidural steroid injections. The patient is likely getting neuromuscular strain from the cervical spondylosis. The patient will go up on the  magnesium to 500 mg daily for the muscle cramps in the feet, if this is not effective, we may use baclofen. She otherwise follow-up in 6 months.  Jill Alexanders MD 11/25/2014 10:17 PM  [facility] [address] [place], [place] [address]  Phone [phone] Fax [phone]

## 2014-12-04 ENCOUNTER — Ambulatory Visit (INDEPENDENT_AMBULATORY_CARE_PROVIDER_SITE_OTHER): Payer: 59 | Admitting: Podiatry

## 2014-12-04 ENCOUNTER — Encounter: Payer: Self-pay | Admitting: Podiatry

## 2014-12-04 VITALS — BP 100/75 | HR 69 | Resp 12

## 2014-12-04 DIAGNOSIS — M2032 Hallux varus (acquired), left foot: Secondary | ICD-10-CM

## 2014-12-04 DIAGNOSIS — M216X9 Other acquired deformities of unspecified foot: Secondary | ICD-10-CM

## 2014-12-04 DIAGNOSIS — L84 Corns and callosities: Secondary | ICD-10-CM

## 2014-12-04 DIAGNOSIS — M2042 Other hammer toe(s) (acquired), left foot: Secondary | ICD-10-CM

## 2014-12-07 NOTE — Progress Notes (Signed)
Patient ID: Brianna Gates, female   DOB: 05/10/1951, 64 y.o.   MRN: [id number]  Subjective: 64 year old female returns the office today with complaints of painful callus on the left foot on oh submetatarsal one. She states that since last when she has been wearing the orthotics  intermittently. She states that she cannot wear them her flat shoes and she does not wear them all the time.  she started having recurrence of the painful callus on the bottom of her left foot. She states the orthotics to fit well when she wears them. No other complaints at this time. No acute changes since last point.  Objective: AAO 3, NAD  DP/PT pulses palpable, CRT less than 3 seconds. There is chronic discoloration to the digits due to Raynauds. Protective sensation intact with Derrel Nip monofilament, vibratory sensation intact, Achilles tendon reflex intact. There is plantar flexed metatarsal left foot with prominent first metatarsal head. Hallux malleus present. Overall cavus foot type. No other open lesions or pre-ulcer lesions identified bilaterally. There is underlying hyperkeratotic lesion. Upon debridement lesion there is underlying ulceration or clinical signs of infection. There is no drainage, purulence. There is no swelling erythema or ascending cellulitis. No other areas of tenderness bilateral lower extremity. No overlying edema, erythema, increased warmth. No pain with calf compression, swelling, warmth, erythema.  Assessment: 64 year old female with left submetatarsal 1 hyperkeratotic lesion due to underlying plantarflexed first metatarsal.  Plan: -Treatment options were discussed the patient the alternatives, risks, complications.  -Lesion sharply debrided without complication/bleeding 1. A pad was placed around the area followed by salinocaine and an occlusive dressing. Post-procedure instructions were discussed with the patient. Monitoring signs or symptoms of infection to call the office  in the fascia currently to the ER.  -Recommended the patient to wear orthotics at all times. Given her foot type she should not wear flat shoes for which she presents in today. Discussed with her that we any soft tissues increase the pressure which can cause ulceration.  -Follow-up as needed. In the meantime occurs call the office with any questions, concerns, change in symptoms.

## 2015-01-01 ENCOUNTER — Other Ambulatory Visit: Payer: Self-pay | Admitting: Endocrinology

## 2015-01-01 DIAGNOSIS — E049 Nontoxic goiter, unspecified: Secondary | ICD-10-CM

## 2015-04-12 ENCOUNTER — Encounter: Payer: Self-pay | Admitting: Gastroenterology

## 2015-04-12 ENCOUNTER — Ambulatory Visit
Admission: RE | Admit: 2015-04-12 | Discharge: 2015-04-12 | Disposition: A | Discharge status: Home / Self Care | Payer: 59 | Source: Ambulatory Visit | Attending: Endocrinology | Admitting: Endocrinology

## 2015-04-12 DIAGNOSIS — E049 Nontoxic goiter, unspecified: Secondary | ICD-10-CM

## 2015-05-03 ENCOUNTER — Other Ambulatory Visit: Payer: Self-pay | Admitting: Neurology

## 2015-05-04 ENCOUNTER — Other Ambulatory Visit: Payer: Self-pay | Admitting: Neurology

## 2015-06-29 ENCOUNTER — Ambulatory Visit (AMBULATORY_SURGERY_CENTER): Payer: Self-pay | Admitting: *Deleted

## 2015-06-29 VITALS — Ht 64.0 in | Wt 128.6 lb

## 2015-06-29 DIAGNOSIS — Z1211 Encounter for screening for malignant neoplasm of colon: Secondary | ICD-10-CM

## 2015-06-29 MED ORDER — SUPREP BOWEL PREP KIT 17.5-3.13-1.6 GM/177ML PO SOLN: 1.0000 | Freq: Once | ORAL | Status: DC

## 2015-06-29 NOTE — Progress Notes (Signed)
Patient denies any allergies to egg or soy products. Patient denies complications with anesthesia/sedation.  Patient denies oxygen use at home and denies diet medications. Emmi instructions for colonoscopy explained and given to patient.  

## 2015-07-13 ENCOUNTER — Ambulatory Visit (AMBULATORY_SURGERY_CENTER): Payer: 59 | Admitting: Internal Medicine

## 2015-07-13 ENCOUNTER — Encounter: Payer: Self-pay | Admitting: Internal Medicine

## 2015-07-13 VITALS — BP 137/76 | HR 51 | Temp 97.9°F | Resp 18 | Ht 64.0 in | Wt 127.0 lb

## 2015-07-13 DIAGNOSIS — Z1211 Encounter for screening for malignant neoplasm of colon: Secondary | ICD-10-CM

## 2015-07-13 MED ORDER — SODIUM CHLORIDE 0.9 % IV SOLN: 500.0000 mL | INTRAVENOUS | Status: DC

## 2015-07-13 NOTE — Patient Instructions (Signed)
YOU HAD AN ENDOSCOPIC PROCEDURE TODAY AT [facility]:   Refer to the procedure report that was given to you for any specific questions about what was found during the examination.  If the procedure report does not answer your questions, please call your gastroenterologist to clarify.  If you requested that your care partner not be given the details of your procedure findings, then the procedure report has been included in a sealed envelope for you to review at your convenience later.  YOU SHOULD EXPECT: Some feelings of bloating in the abdomen. Passage of more gas than usual.  Walking can help get rid of the air that was put into your GI tract during the procedure and reduce the bloating. If you had a lower endoscopy (such as a colonoscopy or flexible sigmoidoscopy) you may notice spotting of blood in your stool or on the toilet paper. If you underwent a bowel prep for your procedure, you may not have a normal bowel movement for a few days.  Please Note:  You might notice some irritation and congestion in your nose or some drainage.  This is from the oxygen used during your procedure.  There is no need for concern and it should clear up in a day or so.  SYMPTOMS TO REPORT IMMEDIATELY:   Following lower endoscopy (colonoscopy or flexible sigmoidoscopy):  Excessive amounts of blood in the stool  Significant tenderness or worsening of abdominal pains  Swelling of the abdomen that is new, acute  Fever of 100F or higher   For urgent or emergent issues, a gastroenterologist can be reached at any hour by calling [phone].   DIET: Your first meal following the procedure should be a small meal and then it is ok to progress to your normal diet. Heavy or fried foods are harder to digest and may make you feel nauseous or bloated.  Likewise, meals heavy in dairy and vegetables can increase bloating.  Drink plenty of fluids but you should avoid alcoholic beverages for 24  hours.  ACTIVITY:  You should plan to take it easy for the rest of today and you should NOT DRIVE or use heavy machinery until tomorrow (because of the sedation medicines used during the test).    FOLLOW UP: Our staff will call the number listed on your records the next business day following your procedure to check on you and address any questions or concerns that you may have regarding the information given to you following your procedure. If we do not reach you, we will leave a message.  However, if you are feeling well and you are not experiencing any problems, there is no need to return our call.  We will assume that you have returned to your regular daily activities without incident.  If any biopsies were taken you will be contacted by phone or by letter within the next 1-3 weeks.  Please call us at [phone] if you have not heard about the biopsies in 3 weeks.    SIGNATURES/CONFIDENTIALITY: You and/or your care partner have signed paperwork which will be entered into your electronic medical record.  These signatures attest to the fact that that the information above on your After Visit Summary has been reviewed and is understood.  Full responsibility of the confidentiality of this discharge information lies with you and/or your care-partner.   Repeat colonoscopy in 10 years

## 2015-07-13 NOTE — Op Note (Signed)
[place]  [employer]. [place], [address]   COLONOSCOPY PROCEDURE REPORT  PATIENT: Brianna Gates, Brianna Gates  MR#: [id number] BIRTHDATE: [birth year] , 63  yrs. old GENDER: female ENDOSCOPIST: Eustace Quail, MD REFERRED BY:.Direct Self PROCEDURE DATE:  07/13/2015 PROCEDURE:   Colonoscopy, screening First Screening Colonoscopy - Avg.  risk and is 50 yrs.  old or older - No.  Prior Negative Screening - Now for repeat screening. 10 or more years since last screening  History of Adenoma - Now for follow-up colonoscopy & has been > or = to 3 yrs.  N/A  Polyps removed today? No Recommend repeat exam, <10 yrs? No ASA CLASS:   Class II INDICATIONS:Screening for colonic neoplasia and Colorectal Neoplasm Risk Assessment for this procedure is average risk.. The patient reports unremarkable colonoscopy greater than 10 years ago (elsewhere) MEDICATIONS: Monitored anesthesia care and Propofol 200 mg IV  DESCRIPTION OF PROCEDURE:   After the risks benefits and alternatives of the procedure were thoroughly explained, informed consent was obtained.  The digital rectal exam revealed no abnormalities of the rectum.   The [id number]  endoscope was introduced through the anus and advanced to the cecum, which was identified by both the appendix and ileocecal valve. No adverse events experienced.   The quality of the prep was excellent. (Suprep was used)  The instrument was then slowly withdrawn as the colon was fully examined. Estimated blood loss is zero unless otherwise noted in this procedure report.      COLON FINDINGS: A normal appearing cecum, ileocecal valve, and appendiceal orifice were identified.  The ascending, transverse, descending, sigmoid colon, and rectum appeared unremarkable. Retroflexed views revealed internal hemorrhoids. The time to cecum = 3.3 Withdrawal time = 9.1   The scope was withdrawn and the procedure completed. COMPLICATIONS: There were no  immediate complications.  ENDOSCOPIC IMPRESSION: 1. Normal colonoscopy  RECOMMENDATIONS: 1. Continue current colorectal screening recommendations for "routine risk" patients with a repeat colonoscopy in 10 years.  eSigned:  Eustace Quail, MD 07/13/2015 10:43 AM   cc: The Patient and Wenda Low MD

## 2015-07-13 NOTE — Progress Notes (Signed)
Report to PACU, RN, vss, BBS= Clear.  

## 2015-07-14 ENCOUNTER — Telehealth: Payer: Self-pay

## 2015-07-14 NOTE — Telephone Encounter (Signed)
  Follow up Call-  Call back number 07/13/2015  Post procedure Call Back phone  # [phone]  Permission to leave phone message Yes     Patient questions:  Do you have a fever, pain , or abdominal swelling? No. Pain Score  0 *  Have you tolerated food without any problems? Yes.    Have you been able to return to your normal activities? Yes.    Do you have any questions about your discharge instructions: Diet   No. Medications  No. Follow up visit  No.  Do you have questions or concerns about your Care? No.  Actions: * If pain score is 4 or above: No action needed, pain <4.

## 2015-07-28 DIAGNOSIS — N993 Prolapse of vaginal vault after hysterectomy: Secondary | ICD-10-CM | POA: Insufficient documentation

## 2015-09-16 HISTORY — PX: OTHER SURGICAL HISTORY: SHX169

## 2015-09-29 ENCOUNTER — Ambulatory Visit: Payer: 59 | Admitting: Internal Medicine

## 2015-11-02 ENCOUNTER — Other Ambulatory Visit: Payer: Self-pay | Admitting: Neurology

## 2015-11-02 NOTE — Telephone Encounter (Signed)
Needs appt before further refills.

## 2015-12-14 ENCOUNTER — Other Ambulatory Visit: Payer: Self-pay | Admitting: Neurology

## 2015-12-30 ENCOUNTER — Ambulatory Visit (INDEPENDENT_AMBULATORY_CARE_PROVIDER_SITE_OTHER): Payer: Self-pay | Admitting: Neurology

## 2015-12-30 ENCOUNTER — Encounter: Payer: Self-pay | Admitting: Neurology

## 2015-12-30 VITALS — BP 135/68 | HR 66 | Ht 64.0 in | Wt 134.5 lb

## 2015-12-30 DIAGNOSIS — S161XXA Strain of muscle, fascia and tendon at neck level, initial encounter: Secondary | ICD-10-CM

## 2015-12-30 DIAGNOSIS — R269 Unspecified abnormalities of gait and mobility: Secondary | ICD-10-CM

## 2015-12-30 DIAGNOSIS — Z8669 Personal history of other diseases of the nervous system and sense organs: Secondary | ICD-10-CM

## 2015-12-30 DIAGNOSIS — M47812 Spondylosis without myelopathy or radiculopathy, cervical region: Secondary | ICD-10-CM

## 2015-12-30 MED ORDER — GABAPENTIN 100 MG PO CAPS: ORAL_CAPSULE | ORAL | Status: DC

## 2015-12-30 NOTE — Progress Notes (Signed)
Reason for visit: History of Guillain-Barr syndrome  Brianna Gates is an 65 y.o. female  History of present illness:  Brianna Gates is a 65 year old right-handed white female with a history of Guillain-Barr syndrome. The patient has essentially made a good recovery from the neuropathy, but she does have some residual neuropathic pain and some fatigue. The patient has recently had left foot surgery for a hammertoe and to lengthen the Achilles tendon. She is recovering from this. She does have ongoing issues with degenerative arthritis involving the neck, she has some discomfort in the neck and shoulders. She has had 2 falls in the last year. Her balance usually is relatively good, however. She returns to this office for an evaluation. She is taking gabapentin 400 mg capsules for discomfort, she finds that this causes a lot of drowsiness when she takes it in the daytime, but it does help her sleep at night.  Past Medical History  Diagnosis Date  . Guillain-Barre syndrome ([place])   . Hypertension   . Dyslipidemia   . Degenerative arthritis   . Hypothyroidism   . Thyroid nodule   . Cervical spondylosis   . Lumbosacral spondylosis   . Raynaud disease   . GERD (gastroesophageal reflux disease)   . Complication of anesthesia     UNKNOWN REACTION WITH RASH AT SITE OF IV only with knee surgery  . Eczema   . Anxiety   . History of Guillain-Barre syndrome 07/22/2014  . Cervical strain 07/22/2014  . Hyperlipidemia   . Asthma     INFREQUENT PROBLEM - rarely used inhaler  . Allergy     Past Surgical History  Procedure Laterality Date  . Abdominal hysterectomy    . Tonsillectomy    . Strabismus surgery Right   . Replacement total knee bilateral Bilateral   . Ganglion cyst excision Left     Wrist  . Hernia repair    . Knee arthroscopy      RT KNEE  . Knee arthrotomy Right 08/27/2013    Procedure: RIGHT KNEE SCAR EXCISION AND FEMORAL REVISION;  Surgeon: Gearlean Alf, MD;   Location: [facility];  Service: Orthopedics;  Laterality: Right;  . Colonoscopy    . Foot surgery Left   . Vaginal vulvo prolapse  09/2015    Family History  Problem Relation Age of Onset  . Hypertension Mother   . Other Mother     Dyslipidemia  . Heart disease Father   . Other Brother     Dyslipidemia  . Stomach cancer Paternal Uncle   . Colon cancer Neg Hx   . Rectal cancer Neg Hx   . Esophageal cancer Neg Hx     Social history:  reports that she has never smoked. She has never used smokeless tobacco. She reports that she drinks about 3.6 oz of alcohol per week. She reports that she does not use illicit drugs.    Allergies  Allergen Reactions  . Altace [Ramipril] Cough  . Buspar [Buspirone] Itching    Vivid dreams, diarrhea   . Captopril   . Daypro [Oxaprozin] Hives  . Zoloft [Sertraline Hcl] Diarrhea  . Influenza Vaccines     HX   . Sulfa Antibiotics Other (See Comments)    unknown    Medications:  Prior to Admission medications   Medication Sig Start Date End Date Taking? Authorizing Provider  albuterol (PROVENTIL HFA;VENTOLIN HFA) 108 (90 BASE) MCG/ACT inhaler Inhale 2 puffs into the lungs every 6 (six) hours  as needed for wheezing or shortness of breath.   Yes Historical Provider, MD  aspirin 81 MG tablet Take 81 mg by mouth daily.   Yes Historical Provider, MD  cetirizine (ZYRTEC ALLERGY) 10 MG tablet Take 10 mg by mouth daily.   Yes Historical Provider, MD  Cholecalciferol (VITAMIN D) 2000 UNITS CAPS Take 1 capsule by mouth daily.   Yes Historical Provider, MD  Estradiol (ESTROGEL TD) Place onto the skin daily.   Yes Historical Provider, MD  levothyroxine (SYNTHROID) 50 MCG tablet Take 50 mcg by mouth daily before breakfast.    Yes Historical Provider, MD  losartan (COZAAR) 50 MG tablet Take 50 mg by mouth daily. CURRENTLY NOT TAKING   Yes Historical Provider, MD  Magnesium 250 MG TABS Take 250 mg by mouth daily.   Yes Historical Provider, MD  pantoprazole  (PROTONIX) 40 MG tablet Take 40 mg by mouth daily. 07/17/13  Yes Historical Provider, MD  rosuvastatin (CRESTOR) 5 MG tablet Take 5 mg by mouth daily.   Yes Historical Provider, MD  gabapentin (NEURONTIN) 100 MG capsule Two capsules in the morning and at midday, 4 capsules at night 12/30/15   Kathrynn Ducking, MD    ROS:  Out of a complete 14 system review of symptoms, the patient complains only of the following symptoms, and all other reviewed systems are negative.  Difficulty swallowing Hearing loss Eye discharge, dry eyes Cold and heat intolerance Swollen abdomen Constipation Frequent waking, daytime sleepiness Joint pain, neck pain, achy muscles, muscle cramps, walking difficulty, neck pain, neck stiffness  Blood pressure 135/68, pulse 66, height 5\' 4"  (1.626 m), weight 134 lb 8 oz (61.009 kg).  Physical Exam  General: The patient is alert and cooperative at the time of the examination.   Neuromuscular: The patient lacks about 30 of lateral rotation turning the head to the left, 20 to the right.  Skin: No significant peripheral edema is noted.   Neurologic Exam  Mental status: The patient is alert and oriented x 3 at the time of the examination. The patient has apparent normal recent and remote memory, with an apparently normal attention span and concentration ability.   Cranial nerves: Facial symmetry is present. Speech is normal, no aphasia or dysarthria is noted. Extraocular movements are full. Visual fields are full.  Motor: The patient has good strength in all 4 extremities.  Sensory examination: Soft touch sensation is symmetric on the face, arms, and legs.  Coordination: The patient has good finger-nose-finger and heel-to-shin bilaterally.  Gait and station: The patient has a normal gait. Tandem gait is slightly unsteady. Romberg is negative. No drift is seen.  Reflexes: Deep tendon reflexes are symmetric, but are depressed absent  throughout.   Assessment/Plan:  1. Cervical spondylosis  2. History of Guillian Barr syndrome  The patient is having ongoing discomfort from the neck and shoulders and with the neuropathy. She has not able to tolerate the 400 mg gabapentin dose during the day, we will convert her to the 100 mg capsules taking 200 mg twice during the day, 400 mg in the evening. She will follow-up in one year, sooner if needed.  Jill Alexanders MD 12/30/2015 7:04 PM  [facility] [address] [place], [place] [address]  Phone [phone] Fax [phone]

## 2016-02-02 DIAGNOSIS — K219 Gastro-esophageal reflux disease without esophagitis: Secondary | ICD-10-CM | POA: Insufficient documentation

## 2016-02-02 DIAGNOSIS — H903 Sensorineural hearing loss, bilateral: Secondary | ICD-10-CM | POA: Insufficient documentation

## 2016-03-21 DIAGNOSIS — F419 Anxiety disorder, unspecified: Secondary | ICD-10-CM | POA: Diagnosis not present

## 2016-03-21 DIAGNOSIS — G610 Guillain-Barre syndrome: Secondary | ICD-10-CM | POA: Diagnosis not present

## 2016-03-21 DIAGNOSIS — E039 Hypothyroidism, unspecified: Secondary | ICD-10-CM | POA: Diagnosis not present

## 2016-03-24 DIAGNOSIS — J3089 Other allergic rhinitis: Secondary | ICD-10-CM | POA: Diagnosis not present

## 2016-03-31 DIAGNOSIS — J3089 Other allergic rhinitis: Secondary | ICD-10-CM | POA: Diagnosis not present

## 2016-04-11 DIAGNOSIS — J3089 Other allergic rhinitis: Secondary | ICD-10-CM | POA: Diagnosis not present

## 2016-04-12 DIAGNOSIS — K219 Gastro-esophageal reflux disease without esophagitis: Secondary | ICD-10-CM | POA: Diagnosis not present

## 2016-04-12 DIAGNOSIS — M8580 Other specified disorders of bone density and structure, unspecified site: Secondary | ICD-10-CM | POA: Diagnosis not present

## 2016-04-12 DIAGNOSIS — E039 Hypothyroidism, unspecified: Secondary | ICD-10-CM | POA: Diagnosis not present

## 2016-04-12 DIAGNOSIS — J040 Acute laryngitis: Secondary | ICD-10-CM | POA: Diagnosis not present

## 2016-04-12 DIAGNOSIS — Z1389 Encounter for screening for other disorder: Secondary | ICD-10-CM | POA: Diagnosis not present

## 2016-04-12 DIAGNOSIS — E7800 Pure hypercholesterolemia, unspecified: Secondary | ICD-10-CM | POA: Diagnosis not present

## 2016-04-12 DIAGNOSIS — Z0000 Encounter for general adult medical examination without abnormal findings: Secondary | ICD-10-CM | POA: Diagnosis not present

## 2016-04-12 DIAGNOSIS — Z23 Encounter for immunization: Secondary | ICD-10-CM | POA: Diagnosis not present

## 2016-04-12 DIAGNOSIS — I10 Essential (primary) hypertension: Secondary | ICD-10-CM | POA: Diagnosis not present

## 2016-04-12 DIAGNOSIS — R7309 Other abnormal glucose: Secondary | ICD-10-CM | POA: Diagnosis not present

## 2016-04-21 DIAGNOSIS — J3089 Other allergic rhinitis: Secondary | ICD-10-CM | POA: Diagnosis not present

## 2016-04-27 DIAGNOSIS — S86312A Strain of muscle(s) and tendon(s) of peroneal muscle group at lower leg level, left leg, initial encounter: Secondary | ICD-10-CM | POA: Diagnosis not present

## 2016-04-27 DIAGNOSIS — R29898 Other symptoms and signs involving the musculoskeletal system: Secondary | ICD-10-CM | POA: Diagnosis not present

## 2016-04-27 DIAGNOSIS — M62562 Muscle wasting and atrophy, not elsewhere classified, left lower leg: Secondary | ICD-10-CM | POA: Diagnosis not present

## 2016-04-28 DIAGNOSIS — J3089 Other allergic rhinitis: Secondary | ICD-10-CM | POA: Diagnosis not present

## 2016-05-12 DIAGNOSIS — M25672 Stiffness of left ankle, not elsewhere classified: Secondary | ICD-10-CM | POA: Diagnosis not present

## 2016-05-15 DIAGNOSIS — J3089 Other allergic rhinitis: Secondary | ICD-10-CM | POA: Diagnosis not present

## 2016-05-15 DIAGNOSIS — M25672 Stiffness of left ankle, not elsewhere classified: Secondary | ICD-10-CM | POA: Diagnosis not present

## 2016-05-19 DIAGNOSIS — M25672 Stiffness of left ankle, not elsewhere classified: Secondary | ICD-10-CM | POA: Diagnosis not present

## 2016-05-22 DIAGNOSIS — J3089 Other allergic rhinitis: Secondary | ICD-10-CM | POA: Diagnosis not present

## 2016-05-23 DIAGNOSIS — M25672 Stiffness of left ankle, not elsewhere classified: Secondary | ICD-10-CM | POA: Diagnosis not present

## 2016-05-26 DIAGNOSIS — M25672 Stiffness of left ankle, not elsewhere classified: Secondary | ICD-10-CM | POA: Diagnosis not present

## 2016-05-29 DIAGNOSIS — J3089 Other allergic rhinitis: Secondary | ICD-10-CM | POA: Diagnosis not present

## 2016-05-29 DIAGNOSIS — M25672 Stiffness of left ankle, not elsewhere classified: Secondary | ICD-10-CM | POA: Diagnosis not present

## 2016-06-01 DIAGNOSIS — M25672 Stiffness of left ankle, not elsewhere classified: Secondary | ICD-10-CM | POA: Diagnosis not present

## 2016-06-05 DIAGNOSIS — M25672 Stiffness of left ankle, not elsewhere classified: Secondary | ICD-10-CM | POA: Diagnosis not present

## 2016-06-07 DIAGNOSIS — M25672 Stiffness of left ankle, not elsewhere classified: Secondary | ICD-10-CM | POA: Diagnosis not present

## 2016-06-08 DIAGNOSIS — J3089 Other allergic rhinitis: Secondary | ICD-10-CM | POA: Diagnosis not present

## 2016-06-13 DIAGNOSIS — M25672 Stiffness of left ankle, not elsewhere classified: Secondary | ICD-10-CM | POA: Diagnosis not present

## 2016-06-15 DIAGNOSIS — J3089 Other allergic rhinitis: Secondary | ICD-10-CM | POA: Diagnosis not present

## 2016-06-15 DIAGNOSIS — M25672 Stiffness of left ankle, not elsewhere classified: Secondary | ICD-10-CM | POA: Diagnosis not present

## 2016-06-20 DIAGNOSIS — M25672 Stiffness of left ankle, not elsewhere classified: Secondary | ICD-10-CM | POA: Diagnosis not present

## 2016-06-22 DIAGNOSIS — M25672 Stiffness of left ankle, not elsewhere classified: Secondary | ICD-10-CM | POA: Diagnosis not present

## 2016-06-26 DIAGNOSIS — M25672 Stiffness of left ankle, not elsewhere classified: Secondary | ICD-10-CM | POA: Diagnosis not present

## 2016-06-27 DIAGNOSIS — J3089 Other allergic rhinitis: Secondary | ICD-10-CM | POA: Diagnosis not present

## 2016-06-28 DIAGNOSIS — R29898 Other symptoms and signs involving the musculoskeletal system: Secondary | ICD-10-CM | POA: Diagnosis not present

## 2016-06-28 DIAGNOSIS — Z4789 Encounter for other orthopedic aftercare: Secondary | ICD-10-CM | POA: Diagnosis not present

## 2016-06-28 DIAGNOSIS — M62562 Muscle wasting and atrophy, not elsewhere classified, left lower leg: Secondary | ICD-10-CM | POA: Diagnosis not present

## 2016-06-29 DIAGNOSIS — M25672 Stiffness of left ankle, not elsewhere classified: Secondary | ICD-10-CM | POA: Diagnosis not present

## 2016-07-03 DIAGNOSIS — J3089 Other allergic rhinitis: Secondary | ICD-10-CM | POA: Diagnosis not present

## 2016-07-08 DIAGNOSIS — M62562 Muscle wasting and atrophy, not elsewhere classified, left lower leg: Secondary | ICD-10-CM | POA: Diagnosis not present

## 2016-07-11 DIAGNOSIS — J3089 Other allergic rhinitis: Secondary | ICD-10-CM | POA: Diagnosis not present

## 2016-07-19 DIAGNOSIS — M62562 Muscle wasting and atrophy, not elsewhere classified, left lower leg: Secondary | ICD-10-CM | POA: Diagnosis not present

## 2016-07-19 DIAGNOSIS — R29898 Other symptoms and signs involving the musculoskeletal system: Secondary | ICD-10-CM | POA: Diagnosis not present

## 2016-07-19 DIAGNOSIS — S86312A Strain of muscle(s) and tendon(s) of peroneal muscle group at lower leg level, left leg, initial encounter: Secondary | ICD-10-CM | POA: Diagnosis not present

## 2016-07-20 DIAGNOSIS — J3089 Other allergic rhinitis: Secondary | ICD-10-CM | POA: Diagnosis not present

## 2016-07-27 DIAGNOSIS — J3089 Other allergic rhinitis: Secondary | ICD-10-CM | POA: Diagnosis not present

## 2016-08-01 DIAGNOSIS — H5213 Myopia, bilateral: Secondary | ICD-10-CM | POA: Diagnosis not present

## 2016-08-01 DIAGNOSIS — H524 Presbyopia: Secondary | ICD-10-CM | POA: Diagnosis not present

## 2016-08-01 DIAGNOSIS — J3089 Other allergic rhinitis: Secondary | ICD-10-CM | POA: Diagnosis not present

## 2016-08-02 DIAGNOSIS — E049 Nontoxic goiter, unspecified: Secondary | ICD-10-CM | POA: Diagnosis not present

## 2016-08-04 DIAGNOSIS — E039 Hypothyroidism, unspecified: Secondary | ICD-10-CM | POA: Diagnosis not present

## 2016-08-04 DIAGNOSIS — E049 Nontoxic goiter, unspecified: Secondary | ICD-10-CM | POA: Diagnosis not present

## 2016-08-08 DIAGNOSIS — J3089 Other allergic rhinitis: Secondary | ICD-10-CM | POA: Diagnosis not present

## 2016-08-14 DIAGNOSIS — H04123 Dry eye syndrome of bilateral lacrimal glands: Secondary | ICD-10-CM | POA: Diagnosis not present

## 2016-08-14 DIAGNOSIS — H43393 Other vitreous opacities, bilateral: Secondary | ICD-10-CM | POA: Diagnosis not present

## 2016-08-15 ENCOUNTER — Ambulatory Visit (INDEPENDENT_AMBULATORY_CARE_PROVIDER_SITE_OTHER): Payer: PPO | Admitting: Neurology

## 2016-08-15 ENCOUNTER — Encounter: Payer: Self-pay | Admitting: Neurology

## 2016-08-15 VITALS — BP 155/69 | Ht 64.0 in | Wt 132.0 lb

## 2016-08-15 DIAGNOSIS — R269 Unspecified abnormalities of gait and mobility: Secondary | ICD-10-CM | POA: Diagnosis not present

## 2016-08-15 DIAGNOSIS — Z8669 Personal history of other diseases of the nervous system and sense organs: Secondary | ICD-10-CM | POA: Diagnosis not present

## 2016-08-15 NOTE — Progress Notes (Signed)
Reason for visit: Leg weakness  Brianna Gates is an 65 y.o. female  History of present illness:  Brianna Gates is a 65 year old right-handed white female with a history of Guillain-Barr syndrome in the past. The patient has had some mild permanent sensory and motor symptoms following this neuropathy. The patient recently had left foot surgery, she was placed in a boot following the surgery. After the boot was removed, the patient has been noted to have some atrophy of the left calf muscle, some weakness with plantar flexion of the left foot. The patient denies any significant discomfort in the back or pain down the left leg. She does have some numbness in both feet, possibly a bit more on the left. The patient feels that her balance is slightly off, she denies any falls. She comes to this office for an evaluation.  Past Medical History:  Diagnosis Date  . Allergy   . Anxiety   . Asthma    INFREQUENT PROBLEM - rarely used inhaler  . Cervical spondylosis   . Cervical strain 07/22/2014  . Complication of anesthesia    UNKNOWN REACTION WITH RASH AT SITE OF IV only with knee surgery  . Degenerative arthritis   . Dyslipidemia   . Eczema   . GERD (gastroesophageal reflux disease)   . Guillain-Barre syndrome ([place])   . History of Guillain-Barre syndrome 07/22/2014  . Hyperlipidemia   . Hypertension   . Hypothyroidism   . Lumbosacral spondylosis   . Raynaud disease   . Thyroid nodule     Past Surgical History:  Procedure Laterality Date  . ABDOMINAL HYSTERECTOMY    . COLONOSCOPY    . FOOT SURGERY Left   . GANGLION CYST EXCISION Left    Wrist  . HERNIA REPAIR    . KNEE ARTHROSCOPY     RT KNEE  . KNEE ARTHROTOMY Right 08/27/2013   Procedure: RIGHT KNEE SCAR EXCISION AND FEMORAL REVISION;  Surgeon: Gearlean Alf, MD;  Location: [facility];  Service: Orthopedics;  Laterality: Right;  . REPLACEMENT TOTAL KNEE BILATERAL Bilateral   . STRABISMUS SURGERY Right   . TONSILLECTOMY      . vaginal vulvo prolapse  09/2015    Family History  Problem Relation Age of Onset  . Hypertension Mother   . Other Mother     Dyslipidemia  . Heart disease Father   . Other Brother     Dyslipidemia  . Stomach cancer Paternal Uncle   . Colon cancer Neg Hx   . Rectal cancer Neg Hx   . Esophageal cancer Neg Hx     Social history:  reports that she has never smoked. She has never used smokeless tobacco. She reports that she drinks about 3.6 oz of alcohol per week . She reports that she does not use drugs.    Allergies  Allergen Reactions  . Altace [Ramipril] Cough  . Buspar [Buspirone] Itching    Vivid dreams, diarrhea   . Captopril   . Daypro [Oxaprozin] Hives  . Zoloft [Sertraline Hcl] Diarrhea  . Ace Inhibitors     Other reaction(s): Other (See Comments) GB syndrome  . Influenza Vaccines     HX   . Sulfa Antibiotics Other (See Comments)    unknown    Medications:  Prior to Admission medications   Medication Sig Start Date End Date Taking? Authorizing Provider  albuterol (PROVENTIL HFA;VENTOLIN HFA) 108 (90 BASE) MCG/ACT inhaler Inhale 2 puffs into the lungs every 6 (six) hours  as needed for wheezing or shortness of breath.   Yes Historical Provider, MD  aspirin 81 MG tablet Take 81 mg by mouth daily.   Yes Historical Provider, MD  cetirizine (ZYRTEC ALLERGY) 10 MG tablet Take 10 mg by mouth daily.   Yes Historical Provider, MD  Cholecalciferol (VITAMIN D) 2000 UNITS CAPS Take 1 capsule by mouth daily.   Yes Historical Provider, MD  Estradiol (ESTROGEL TD) Place onto the skin daily.   Yes Historical Provider, MD  fluticasone (FLONASE) 50 MCG/ACT nasal spray Place into the nose.   Yes Historical Provider, MD  gabapentin (NEURONTIN) 100 MG capsule Two capsules in the morning and at midday, 4 capsules at night 12/30/15  Yes Kathrynn Ducking, MD  levothyroxine (SYNTHROID) 50 MCG tablet Take 50 mcg by mouth daily before breakfast.    Yes Historical Provider, MD  losartan  (COZAAR) 50 MG tablet Take 50 mg by mouth daily.    Yes Historical Provider, MD  Magnesium 250 MG TABS Take 250 mg by mouth daily.   Yes Historical Provider, MD  pantoprazole (PROTONIX) 40 MG tablet Take 40 mg by mouth daily. 07/17/13  Yes Historical Provider, MD  rosuvastatin (CRESTOR) 5 MG tablet Take 5 mg by mouth daily.   Yes Historical Provider, MD  vitamin E 400 UNIT capsule Take 400 Units by mouth daily.    Yes Historical Provider, MD    ROS:  Out of a complete 14 system review of symptoms, the patient complains only of the following symptoms, and all other reviewed systems are negative.  Cold intolerance Swollen abdomen Daytime sleepiness, snoring Joint pain, achy muscles, muscle cramps, neck stiffness Environmental allergies Weakness  Blood pressure (!) 155/69, height 5\' 4"  (1.626 m), weight 132 lb (59.9 kg).  Physical Exam  General: The patient is alert and cooperative at the time of the examination.  Skin: No significant peripheral edema is noted. The gastrocnemius muscle on the left leg is smaller than the right.   Neurologic Exam  Mental status: The patient is alert and oriented x 3 at the time of the examination. The patient has apparent normal recent and remote memory, with an apparently normal attention span and concentration ability.   Cranial nerves: Facial symmetry is present. Speech is normal, no aphasia or dysarthria is noted. Extraocular movements are full. Visual fields are full.  Motor: The patient has good strength in all 4 extremities, with exception that there is slight weakness of intrinsic muscles of the hands bilaterally. The patient has difficulty walking on the toes on the left foot, able to perform on the right. The patient can walk on heels bilaterally.  Sensory examination: Soft touch sensation is symmetric on the face, arms, and legs.  Coordination: The patient has good finger-nose-finger and heel-to-shin bilaterally.  Gait and station: The  patient has a normal gait. Tandem gait is slightly unsteady. Romberg is negative. No drift is seen.  Reflexes: Deep tendon reflexes are symmetric, but are depressed.   Assessment/Plan:  1. History of Guillain-Barr syndrome  2. Left leg weakness  The patient has some atrophy of the gastrocnemius muscle on the left leg. The patient is not having a lot of pain with this. The patient be set up for nerve conduction studies on both legs, EMG on the left leg. She will return for this study.  Jill Alexanders MD 08/15/2016 4:28 PM  [facility] [address] [place], [place] [address]  Phone [phone] Fax [phone]

## 2016-08-17 DIAGNOSIS — J3089 Other allergic rhinitis: Secondary | ICD-10-CM | POA: Diagnosis not present

## 2016-08-22 DIAGNOSIS — H43811 Vitreous degeneration, right eye: Secondary | ICD-10-CM | POA: Diagnosis not present

## 2016-08-22 DIAGNOSIS — H43391 Other vitreous opacities, right eye: Secondary | ICD-10-CM | POA: Diagnosis not present

## 2016-08-22 DIAGNOSIS — H2512 Age-related nuclear cataract, left eye: Secondary | ICD-10-CM | POA: Diagnosis not present

## 2016-08-22 DIAGNOSIS — H43812 Vitreous degeneration, left eye: Secondary | ICD-10-CM | POA: Diagnosis not present

## 2016-08-22 DIAGNOSIS — H2511 Age-related nuclear cataract, right eye: Secondary | ICD-10-CM | POA: Diagnosis not present

## 2016-08-23 DIAGNOSIS — J3089 Other allergic rhinitis: Secondary | ICD-10-CM | POA: Diagnosis not present

## 2016-08-28 DIAGNOSIS — J3089 Other allergic rhinitis: Secondary | ICD-10-CM | POA: Diagnosis not present

## 2016-08-29 ENCOUNTER — Encounter: Payer: Self-pay | Admitting: Neurology

## 2016-08-29 ENCOUNTER — Ambulatory Visit (INDEPENDENT_AMBULATORY_CARE_PROVIDER_SITE_OTHER): Payer: PPO | Admitting: Neurology

## 2016-08-29 DIAGNOSIS — R2689 Other abnormalities of gait and mobility: Secondary | ICD-10-CM | POA: Diagnosis not present

## 2016-08-29 DIAGNOSIS — R531 Weakness: Secondary | ICD-10-CM

## 2016-08-29 DIAGNOSIS — R269 Unspecified abnormalities of gait and mobility: Secondary | ICD-10-CM

## 2016-08-29 DIAGNOSIS — Z8669 Personal history of other diseases of the nervous system and sense organs: Secondary | ICD-10-CM

## 2016-08-29 NOTE — Progress Notes (Signed)
This patient comes in for EMG and nerve conduction study evaluation today. The patient has had some atrophy and weakness of the left gastrocnemius muscle.  EMG and nerve conduction study evaluation showed some mild dysfunction of the left peroneal nerve, EMG of the left leg shows decreased recruitment of the left gastrocnemius muscle, but there is no evidence of significant acute or chronic denervation, and no evidence of an overlying lumbosacral radiculopathy.  No evidence for any further workup, if the patient believes that she is continuing to get weaker, we may consider MRI of the lumbar spine.

## 2016-08-29 NOTE — Progress Notes (Signed)
Please refer to EMG and nerve conduction study procedure note. 

## 2016-08-29 NOTE — Procedures (Signed)
     HISTORY:  Brianna Gates is a 65 year old white female with a history of Guillain-Barr syndrome in the past. The patient has noted some atrophy of the left gastrocnemius muscle, difficulty standing on her toes on the left leg. The patient does have some mild back pain, no radiation down the left leg. She is being evaluated for the left leg muscle atrophy.  NERVE CONDUCTION STUDIES:  Nerve conduction studies were performed on both lower extremities. The distal motor latencies for the peroneal and posterior tibial nerves were normal bilaterally with a low motor amplitude for the left peroneal nerve and normal for the right peroneal nerve and for the posterior tibial nerves bilaterally. The nerve conduction velocities for the peroneal and posterior tibial nerves were normal bilaterally. The F wave latencies for the peroneal and posterior tibial nerves were normal bilaterally, and the H reflex latencies were symmetric and normal. The peroneal sensory latencies were normal on the right, unobtainable on the left.  EMG STUDIES:  EMG study was performed on the left lower extremity:  The tibialis anterior muscle reveals 2 to 4K motor units with full recruitment. No fibrillations or positive waves were seen. The peroneus tertius muscle reveals 2 to 4K motor units with full recruitment. No fibrillations or positive waves were seen. The medial gastrocnemius muscle reveals 1 to 3K motor units with slightly decreased recruitment. No fibrillations or positive waves were seen. The vastus lateralis muscle reveals 2 to 4K motor units with full recruitment. No fibrillations or positive waves were seen. The iliopsoas muscle reveals 2 to 4K motor units with full recruitment. No fibrillations or positive waves were seen. The biceps femoris muscle (long head) reveals 2 to 4K motor units with full recruitment. No fibrillations or positive waves were seen. The lumbosacral paraspinal muscles were tested at 3  levels, and revealed no abnormalities of insertional activity at all 3 levels tested. There was good relaxation.   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 EMG evaluation of the left lower extremity shows mild decreased recruitment with the left gastrocnemius muscle, no evidence of significant acute or chronic denervation was seen. There is no evidence of an overlying lumbosacral radiculopathy.  Jill Alexanders MD 08/29/2016 11:31 AM  [facility] [address] [place], [place] [address]  Phone [phone] Fax [phone]

## 2016-09-01 DIAGNOSIS — J3089 Other allergic rhinitis: Secondary | ICD-10-CM | POA: Diagnosis not present

## 2016-09-06 ENCOUNTER — Other Ambulatory Visit: Payer: Self-pay | Admitting: Neurology

## 2016-09-06 DIAGNOSIS — J3089 Other allergic rhinitis: Secondary | ICD-10-CM | POA: Diagnosis not present

## 2016-09-12 ENCOUNTER — Encounter: Payer: PPO | Admitting: Neurology

## 2016-09-13 DIAGNOSIS — J3089 Other allergic rhinitis: Secondary | ICD-10-CM | POA: Diagnosis not present

## 2016-09-18 DIAGNOSIS — M62562 Muscle wasting and atrophy, not elsewhere classified, left lower leg: Secondary | ICD-10-CM | POA: Diagnosis not present

## 2016-09-18 DIAGNOSIS — R29898 Other symptoms and signs involving the musculoskeletal system: Secondary | ICD-10-CM | POA: Diagnosis not present

## 2016-09-22 DIAGNOSIS — J3089 Other allergic rhinitis: Secondary | ICD-10-CM | POA: Diagnosis not present

## 2016-09-26 DIAGNOSIS — H16223 Keratoconjunctivitis sicca, not specified as Sjogren's, bilateral: Secondary | ICD-10-CM | POA: Diagnosis not present

## 2016-09-26 DIAGNOSIS — H04123 Dry eye syndrome of bilateral lacrimal glands: Secondary | ICD-10-CM | POA: Diagnosis not present

## 2016-09-27 DIAGNOSIS — J3089 Other allergic rhinitis: Secondary | ICD-10-CM | POA: Diagnosis not present

## 2016-09-28 DIAGNOSIS — R29898 Other symptoms and signs involving the musculoskeletal system: Secondary | ICD-10-CM | POA: Diagnosis not present

## 2016-10-02 DIAGNOSIS — J3089 Other allergic rhinitis: Secondary | ICD-10-CM | POA: Diagnosis not present

## 2016-10-05 DIAGNOSIS — R29898 Other symptoms and signs involving the musculoskeletal system: Secondary | ICD-10-CM | POA: Diagnosis not present

## 2016-10-06 DIAGNOSIS — J3089 Other allergic rhinitis: Secondary | ICD-10-CM | POA: Diagnosis not present

## 2016-10-12 DIAGNOSIS — J3089 Other allergic rhinitis: Secondary | ICD-10-CM | POA: Diagnosis not present

## 2016-10-12 DIAGNOSIS — R29898 Other symptoms and signs involving the musculoskeletal system: Secondary | ICD-10-CM | POA: Diagnosis not present

## 2016-10-17 DIAGNOSIS — G629 Polyneuropathy, unspecified: Secondary | ICD-10-CM | POA: Diagnosis not present

## 2016-10-17 DIAGNOSIS — E7800 Pure hypercholesterolemia, unspecified: Secondary | ICD-10-CM | POA: Diagnosis not present

## 2016-10-17 DIAGNOSIS — G650 Sequelae of Guillain-Barre syndrome: Secondary | ICD-10-CM | POA: Diagnosis not present

## 2016-10-17 DIAGNOSIS — E039 Hypothyroidism, unspecified: Secondary | ICD-10-CM | POA: Diagnosis not present

## 2016-10-17 DIAGNOSIS — G610 Guillain-Barre syndrome: Secondary | ICD-10-CM | POA: Diagnosis not present

## 2016-10-17 DIAGNOSIS — I10 Essential (primary) hypertension: Secondary | ICD-10-CM | POA: Diagnosis not present

## 2016-10-17 DIAGNOSIS — K219 Gastro-esophageal reflux disease without esophagitis: Secondary | ICD-10-CM | POA: Diagnosis not present

## 2016-10-19 DIAGNOSIS — R29898 Other symptoms and signs involving the musculoskeletal system: Secondary | ICD-10-CM | POA: Diagnosis not present

## 2016-10-20 DIAGNOSIS — J3089 Other allergic rhinitis: Secondary | ICD-10-CM | POA: Diagnosis not present

## 2016-10-26 DIAGNOSIS — J3089 Other allergic rhinitis: Secondary | ICD-10-CM | POA: Diagnosis not present

## 2016-10-26 DIAGNOSIS — R29898 Other symptoms and signs involving the musculoskeletal system: Secondary | ICD-10-CM | POA: Diagnosis not present

## 2016-11-03 DIAGNOSIS — J3089 Other allergic rhinitis: Secondary | ICD-10-CM | POA: Diagnosis not present

## 2016-11-07 DIAGNOSIS — R29898 Other symptoms and signs involving the musculoskeletal system: Secondary | ICD-10-CM | POA: Diagnosis not present

## 2016-11-09 DIAGNOSIS — R29898 Other symptoms and signs involving the musculoskeletal system: Secondary | ICD-10-CM | POA: Diagnosis not present

## 2016-11-10 DIAGNOSIS — J3089 Other allergic rhinitis: Secondary | ICD-10-CM | POA: Diagnosis not present

## 2016-11-13 DIAGNOSIS — H04123 Dry eye syndrome of bilateral lacrimal glands: Secondary | ICD-10-CM | POA: Diagnosis not present

## 2016-11-13 DIAGNOSIS — H16223 Keratoconjunctivitis sicca, not specified as Sjogren's, bilateral: Secondary | ICD-10-CM | POA: Diagnosis not present

## 2016-11-14 DIAGNOSIS — R29898 Other symptoms and signs involving the musculoskeletal system: Secondary | ICD-10-CM | POA: Diagnosis not present

## 2016-11-16 DIAGNOSIS — R29898 Other symptoms and signs involving the musculoskeletal system: Secondary | ICD-10-CM | POA: Diagnosis not present

## 2016-11-17 DIAGNOSIS — J3089 Other allergic rhinitis: Secondary | ICD-10-CM | POA: Diagnosis not present

## 2016-11-20 DIAGNOSIS — H16223 Keratoconjunctivitis sicca, not specified as Sjogren's, bilateral: Secondary | ICD-10-CM | POA: Diagnosis not present

## 2016-11-20 DIAGNOSIS — H04123 Dry eye syndrome of bilateral lacrimal glands: Secondary | ICD-10-CM | POA: Diagnosis not present

## 2016-11-21 DIAGNOSIS — R29898 Other symptoms and signs involving the musculoskeletal system: Secondary | ICD-10-CM | POA: Diagnosis not present

## 2016-11-23 DIAGNOSIS — J069 Acute upper respiratory infection, unspecified: Secondary | ICD-10-CM | POA: Diagnosis not present

## 2016-11-23 DIAGNOSIS — R29898 Other symptoms and signs involving the musculoskeletal system: Secondary | ICD-10-CM | POA: Diagnosis not present

## 2016-11-24 DIAGNOSIS — H16223 Keratoconjunctivitis sicca, not specified as Sjogren's, bilateral: Secondary | ICD-10-CM | POA: Diagnosis not present

## 2016-11-24 DIAGNOSIS — H04123 Dry eye syndrome of bilateral lacrimal glands: Secondary | ICD-10-CM | POA: Diagnosis not present

## 2016-11-28 DIAGNOSIS — H16223 Keratoconjunctivitis sicca, not specified as Sjogren's, bilateral: Secondary | ICD-10-CM | POA: Diagnosis not present

## 2016-11-28 DIAGNOSIS — H04123 Dry eye syndrome of bilateral lacrimal glands: Secondary | ICD-10-CM | POA: Diagnosis not present

## 2016-11-30 DIAGNOSIS — R29898 Other symptoms and signs involving the musculoskeletal system: Secondary | ICD-10-CM | POA: Diagnosis not present

## 2016-12-02 DIAGNOSIS — J0100 Acute maxillary sinusitis, unspecified: Secondary | ICD-10-CM | POA: Diagnosis not present

## 2016-12-02 DIAGNOSIS — R05 Cough: Secondary | ICD-10-CM | POA: Diagnosis not present

## 2016-12-07 DIAGNOSIS — R29898 Other symptoms and signs involving the musculoskeletal system: Secondary | ICD-10-CM | POA: Diagnosis not present

## 2016-12-08 DIAGNOSIS — J3089 Other allergic rhinitis: Secondary | ICD-10-CM | POA: Diagnosis not present

## 2016-12-13 DIAGNOSIS — N3281 Overactive bladder: Secondary | ICD-10-CM | POA: Diagnosis not present

## 2016-12-13 DIAGNOSIS — N952 Postmenopausal atrophic vaginitis: Secondary | ICD-10-CM | POA: Diagnosis not present

## 2016-12-15 DIAGNOSIS — J3089 Other allergic rhinitis: Secondary | ICD-10-CM | POA: Diagnosis not present

## 2016-12-26 DIAGNOSIS — J3089 Other allergic rhinitis: Secondary | ICD-10-CM | POA: Diagnosis not present

## 2016-12-28 ENCOUNTER — Ambulatory Visit (INDEPENDENT_AMBULATORY_CARE_PROVIDER_SITE_OTHER): Payer: PPO | Admitting: Neurology

## 2016-12-28 ENCOUNTER — Encounter: Payer: Self-pay | Admitting: Neurology

## 2016-12-28 VITALS — BP 134/79 | HR 61 | Ht 64.0 in | Wt 129.5 lb

## 2016-12-28 DIAGNOSIS — M545 Low back pain: Secondary | ICD-10-CM

## 2016-12-28 DIAGNOSIS — G8929 Other chronic pain: Secondary | ICD-10-CM

## 2016-12-28 NOTE — Patient Instructions (Signed)
   We will get MRI of the low back.

## 2016-12-28 NOTE — Progress Notes (Signed)
Reason for visit: Left leg weakness  Brianna Gates is an 66 y.o. female  History of present illness:  Brianna Gates is a 66 year old right-handed white female with a history of Guillain-Barr syndrome the patient does have some chronic low back pain, she has noted within the last year or 2 that she has some atrophy of the left gastrocnemius muscle. She has been working on strengthening exercises, she is able to stand on her toes with the left leg, but it is more of an effort than it is on the right leg. The patient denies any sciatica type pain, but she does have low back pain itself. The patient has not had any falls. She has had EMG and nerve conduction study evaluation that showed only chronic changes in the left leg, no evidence of an acute lumbosacral radiculopathy.  Past Medical History:  Diagnosis Date  . Allergy   . Anxiety   . Asthma    INFREQUENT PROBLEM - rarely used inhaler  . Cervical spondylosis   . Cervical strain 07/22/2014  . Complication of anesthesia    UNKNOWN REACTION WITH RASH AT SITE OF IV only with knee surgery  . Degenerative arthritis   . Dyslipidemia   . Eczema   . GERD (gastroesophageal reflux disease)   . Guillain-Barre syndrome ([place])   . History of Guillain-Barre syndrome 07/22/2014  . Hyperlipidemia   . Hypertension   . Hypothyroidism   . Lumbosacral spondylosis   . Raynaud disease   . Thyroid nodule     Past Surgical History:  Procedure Laterality Date  . ABDOMINAL HYSTERECTOMY    . COLONOSCOPY    . FOOT SURGERY Left   . GANGLION CYST EXCISION Left    Wrist  . HERNIA REPAIR    . KNEE ARTHROSCOPY     RT KNEE  . KNEE ARTHROTOMY Right 08/27/2013   Procedure: RIGHT KNEE SCAR EXCISION AND FEMORAL REVISION;  Surgeon: Gearlean Alf, MD;  Location: [facility];  Service: Orthopedics;  Laterality: Right;  . REPLACEMENT TOTAL KNEE BILATERAL Bilateral   . STRABISMUS SURGERY Right   . TONSILLECTOMY    . vaginal vulvo prolapse  09/2015     Family History  Problem Relation Age of Onset  . Hypertension Mother   . Other Mother     Dyslipidemia  . Heart disease Father   . Other Brother     Dyslipidemia  . Stomach cancer Paternal Uncle   . Colon cancer Neg Hx   . Rectal cancer Neg Hx   . Esophageal cancer Neg Hx     Social history:  reports that she has never smoked. She has never used smokeless tobacco. She reports that she drinks about 3.6 oz of alcohol per week . She reports that she does not use drugs.    Allergies  Allergen Reactions  . Altace [Ramipril] Cough  . Buspar [Buspirone] Itching    Vivid dreams, diarrhea   . Captopril   . Daypro [Oxaprozin] Hives  . Zoloft [Sertraline Hcl] Diarrhea  . Ace Inhibitors     Other reaction(s): Other (See Comments) GB syndrome  . Influenza Vaccines     HX   . Sulfa Antibiotics Other (See Comments)    unknown    Medications:  Prior to Admission medications   Medication Sig Start Date End Date Taking? Authorizing Provider  albuterol (PROVENTIL HFA;VENTOLIN HFA) 108 (90 BASE) MCG/ACT inhaler Inhale 2 puffs into the lungs every 6 (six) hours as needed for wheezing  or shortness of breath.   Yes Historical Provider, MD  aspirin 81 MG tablet Take 81 mg by mouth daily.   Yes Historical Provider, MD  cetirizine (ZYRTEC ALLERGY) 10 MG tablet Take 10 mg by mouth daily.   Yes Historical Provider, MD  Cholecalciferol (VITAMIN D) 2000 UNITS CAPS Take 1 capsule by mouth daily.   Yes Historical Provider, MD  Estradiol (ESTROGEL TD) Place onto the skin daily.   Yes Historical Provider, MD  fluticasone (FLONASE) 50 MCG/ACT nasal spray Place into the nose.   Yes Historical Provider, MD  gabapentin (NEURONTIN) 100 MG capsule TAKE TWO CAPSULES IN THE MORNING AND AT MID-DAY AND TAKE FOUR CAPSULES AT NIGHT 09/06/16  Yes Kathrynn Ducking, MD  levothyroxine (SYNTHROID) 50 MCG tablet Take 50 mcg by mouth daily before breakfast.    Yes Historical Provider, MD  losartan (COZAAR) 50 MG  tablet Take 50 mg by mouth daily.    Yes Historical Provider, MD  Magnesium 250 MG TABS Take 250 mg by mouth daily.   Yes Historical Provider, MD  pantoprazole (PROTONIX) 40 MG tablet Take 40 mg by mouth daily. 07/17/13  Yes Historical Provider, MD  rosuvastatin (CRESTOR) 5 MG tablet Take 5 mg by mouth daily.   Yes Historical Provider, MD  vitamin E 400 UNIT capsule Take 400 Units by mouth daily.    Yes Historical Provider, MD    ROS:  Out of a complete 14 system review of symptoms, the patient complains only of the following symptoms, and all other reviewed systems are negative.  Eye discharge, eye itching Cold intolerance, heat intolerance Environmental allergies  Blood pressure 134/79, pulse 61, height 5\' 4"  (1.626 m), weight 129 lb 8 oz (58.7 kg).  Physical Exam  General: The patient is alert and cooperative at the time of the examination.  Neuromuscular: There is clear atrophy of the left calf muscle as compared to the right.  Skin: No significant peripheral edema is noted.   Neurologic Exam  Mental status: The patient is alert and oriented x 3 at the time of the examination. The patient has apparent normal recent and remote memory, with an apparently normal attention span and concentration ability.   Cranial nerves: Facial symmetry is present. Speech is normal, no aphasia or dysarthria is noted. Extraocular movements are full. Visual fields are full.  Motor: The patient has good strength in all 4 extremities.  Sensory examination: Soft touch sensation is symmetric on the face, arms, and legs.  Coordination: The patient has good finger-nose-finger and heel-to-shin bilaterally.  Gait and station: The patient has a normal gait. Tandem gait is minimally unsteady. Romberg is negative. No drift is seen.  Reflexes: Deep tendon reflexes are symmetric. The ankle jerk reflexes are depressed absent bilaterally, trace knee jerk reflexes are seen  bilaterally.   Assessment/Plan:  1. Left leg atrophy  2. Chronic low back pain  The patient does have atrophy of the left leg, we will check MRI of the low back at this time. I will contact her with the results of the above study.  Jill Alexanders MD 12/28/2016 3:03 PM  [facility] [address] [place], [place] [address]  Phone [phone] Fax [phone]

## 2017-01-05 DIAGNOSIS — J3089 Other allergic rhinitis: Secondary | ICD-10-CM | POA: Diagnosis not present

## 2017-01-08 ENCOUNTER — Ambulatory Visit
Admission: RE | Admit: 2017-01-08 | Discharge: 2017-01-08 | Disposition: A | Discharge status: Home / Self Care | Payer: PPO | Source: Ambulatory Visit | Attending: Neurology | Admitting: Neurology

## 2017-01-08 DIAGNOSIS — M545 Low back pain, unspecified: Secondary | ICD-10-CM

## 2017-01-08 DIAGNOSIS — M5126 Other intervertebral disc displacement, lumbar region: Secondary | ICD-10-CM | POA: Diagnosis not present

## 2017-01-08 DIAGNOSIS — G8929 Other chronic pain: Secondary | ICD-10-CM

## 2017-01-09 DIAGNOSIS — J3089 Other allergic rhinitis: Secondary | ICD-10-CM | POA: Diagnosis not present

## 2017-01-10 ENCOUNTER — Telehealth: Payer: Self-pay | Admitting: Neurology

## 2017-01-10 NOTE — Telephone Encounter (Signed)
I called patient. The MRI of the low back does not show any surgically amenable issues, no evidence of nerve root impingement. I discussed this with the patient.   MRI lumbar 01/09/17:  IMPRESSION:  Abnormal MRI lumbar spine (without) demonstrating: 1. At L2-3, L3-4: disc bulging and facet hypertrophy with mild spinal stenosis or foraminal narrowing 2. At L4-5: pseudo-disc bulging and facet hypertrophy with no spinal stenosis or foraminal narrowing

## 2017-01-18 DIAGNOSIS — J3089 Other allergic rhinitis: Secondary | ICD-10-CM | POA: Diagnosis not present

## 2017-02-05 DIAGNOSIS — J3089 Other allergic rhinitis: Secondary | ICD-10-CM | POA: Diagnosis not present

## 2017-02-15 DIAGNOSIS — J3089 Other allergic rhinitis: Secondary | ICD-10-CM | POA: Diagnosis not present

## 2017-03-06 DIAGNOSIS — J3089 Other allergic rhinitis: Secondary | ICD-10-CM | POA: Diagnosis not present

## 2017-03-07 ENCOUNTER — Ambulatory Visit: Payer: PPO | Admitting: Adult Health

## 2017-03-07 DIAGNOSIS — Z6821 Body mass index (BMI) 21.0-21.9, adult: Secondary | ICD-10-CM | POA: Diagnosis not present

## 2017-03-07 DIAGNOSIS — N3281 Overactive bladder: Secondary | ICD-10-CM | POA: Diagnosis not present

## 2017-03-07 DIAGNOSIS — Z1231 Encounter for screening mammogram for malignant neoplasm of breast: Secondary | ICD-10-CM | POA: Diagnosis not present

## 2017-03-07 DIAGNOSIS — R140 Abdominal distension (gaseous): Secondary | ICD-10-CM | POA: Diagnosis not present

## 2017-03-07 DIAGNOSIS — Z01419 Encounter for gynecological examination (general) (routine) without abnormal findings: Secondary | ICD-10-CM | POA: Diagnosis not present

## 2017-03-07 DIAGNOSIS — N952 Postmenopausal atrophic vaginitis: Secondary | ICD-10-CM | POA: Diagnosis not present

## 2017-03-08 ENCOUNTER — Ambulatory Visit: Payer: PPO | Admitting: Adult Health

## 2017-03-16 DIAGNOSIS — J3089 Other allergic rhinitis: Secondary | ICD-10-CM | POA: Diagnosis not present

## 2017-03-21 DIAGNOSIS — J3089 Other allergic rhinitis: Secondary | ICD-10-CM | POA: Diagnosis not present

## 2017-03-26 DIAGNOSIS — H16223 Keratoconjunctivitis sicca, not specified as Sjogren's, bilateral: Secondary | ICD-10-CM | POA: Diagnosis not present

## 2017-03-26 DIAGNOSIS — H04123 Dry eye syndrome of bilateral lacrimal glands: Secondary | ICD-10-CM | POA: Diagnosis not present

## 2017-03-29 DIAGNOSIS — J3089 Other allergic rhinitis: Secondary | ICD-10-CM | POA: Diagnosis not present

## 2017-03-30 DIAGNOSIS — H04123 Dry eye syndrome of bilateral lacrimal glands: Secondary | ICD-10-CM | POA: Diagnosis not present

## 2017-03-30 DIAGNOSIS — H16223 Keratoconjunctivitis sicca, not specified as Sjogren's, bilateral: Secondary | ICD-10-CM | POA: Diagnosis not present

## 2017-04-03 DIAGNOSIS — J3089 Other allergic rhinitis: Secondary | ICD-10-CM | POA: Diagnosis not present

## 2017-04-09 DIAGNOSIS — H16223 Keratoconjunctivitis sicca, not specified as Sjogren's, bilateral: Secondary | ICD-10-CM | POA: Diagnosis not present

## 2017-04-09 DIAGNOSIS — H04123 Dry eye syndrome of bilateral lacrimal glands: Secondary | ICD-10-CM | POA: Diagnosis not present

## 2017-04-12 DIAGNOSIS — J3089 Other allergic rhinitis: Secondary | ICD-10-CM | POA: Diagnosis not present

## 2017-04-16 DIAGNOSIS — L821 Other seborrheic keratosis: Secondary | ICD-10-CM | POA: Diagnosis not present

## 2017-04-16 DIAGNOSIS — L550 Sunburn of first degree: Secondary | ICD-10-CM | POA: Diagnosis not present

## 2017-04-16 DIAGNOSIS — D225 Melanocytic nevi of trunk: Secondary | ICD-10-CM | POA: Diagnosis not present

## 2017-04-17 ENCOUNTER — Ambulatory Visit (INDEPENDENT_AMBULATORY_CARE_PROVIDER_SITE_OTHER): Payer: PPO | Admitting: Adult Health

## 2017-04-17 ENCOUNTER — Encounter: Payer: Self-pay | Admitting: Adult Health

## 2017-04-17 VITALS — BP 131/68 | HR 64 | Ht 64.0 in | Wt 126.3 lb

## 2017-04-17 DIAGNOSIS — M62569 Muscle wasting and atrophy, not elsewhere classified, unspecified lower leg: Secondary | ICD-10-CM | POA: Diagnosis not present

## 2017-04-17 DIAGNOSIS — M545 Low back pain: Secondary | ICD-10-CM | POA: Diagnosis not present

## 2017-04-17 DIAGNOSIS — Z8669 Personal history of other diseases of the nervous system and sense organs: Secondary | ICD-10-CM | POA: Diagnosis not present

## 2017-04-17 DIAGNOSIS — G8929 Other chronic pain: Secondary | ICD-10-CM

## 2017-04-17 NOTE — Patient Instructions (Signed)
Your Plan:  Continue gabapentin Continue strength training If your symptoms worsen or you develop new symptoms please let us know.    Thank you for coming to see [place] at [facility]. I hope we have been able to provide you high quality care today.  You may receive a patient satisfaction survey over the next few weeks. We would appreciate your feedback and comments so that we may continue to improve ourselves and the health of our patients.

## 2017-04-17 NOTE — Progress Notes (Signed)
I have read the note, and I agree with the clinical assessment and plan.  Brianna Gates,Brianna Gates   

## 2017-04-17 NOTE — Progress Notes (Signed)
PATIENT: Brianna Gates DOB: 1951-04-26  REASON FOR VISIT: follow up- Guillain-Barr syndrome, chronic low back pain HISTORY FROM: patient  HISTORY OF PRESENT ILLNESS: 04/17/17 Ms. Brianna Gates is a 66 year old female with a history of Lonia Blood syndrome and chronic low back pain. She returns today for follow-up. She states that she's been working with a trainer 2 times a week and doing water aerobics and this seems to have improved her strength slightly on the left leg. She states that she is now able to use some calf rasies were before she was not able to do this. She states that she does seem to get more cramps in the left leg when exercising. She continues on gabapentin for neuralgia. She states that if she misses her afternoon dose she has noticed some sharp shooting pains in the feet. She plans to follow-up with her rheumatologist for ongoing joint pain and raynauds syndrome. She returns today for an evaluation.  HISTORY Brianna Gates is a 66 year old right-handed white female with a history of Guillain-Barr syndrome the patient does have some chronic low back pain, she has noted within the last year or 2 that she has some atrophy of the left gastrocnemius muscle. She has been working on strengthening exercises, she is able to stand on her toes with the left leg, but it is more of an effort than it is on the right leg. The patient denies any sciatica type pain, but she does have low back pain itself. The patient has not had any falls. She has had EMG and nerve conduction study evaluation that showed only chronic changes in the left leg, no evidence of an acute lumbosacral radiculopathy.   REVIEW OF SYSTEMS: Out of a complete 14 system review of symptoms, the patient complains only of the following symptoms, and all other reviewed systems are negative.  Joint pain, back pain, aching muscles, muscle cramps, neck stiffness, rash, itching, frequent waking, cold intolerance, heat  intolerance, environmental allergies, eye itching, eye redness  ALLERGIES: Allergies  Allergen Reactions  . Altace [Ramipril] Cough  . Buspar [Buspirone] Itching    Vivid dreams, diarrhea   . Captopril   . Daypro [Oxaprozin] Hives  . Zoloft [Sertraline Hcl] Diarrhea  . Ace Inhibitors     Other reaction(s): Other (See Comments) GB syndrome  . Influenza Vaccines     HX   . Sulfa Antibiotics Other (See Comments)    unknown    HOME MEDICATIONS: Outpatient Medications Prior to Visit  Medication Sig Dispense Refill  . albuterol (PROVENTIL HFA;VENTOLIN HFA) 108 (90 BASE) MCG/ACT inhaler Inhale 2 puffs into the lungs every 6 (six) hours as needed for wheezing or shortness of breath.    Marland Kitchen aspirin 81 MG tablet Take 81 mg by mouth daily.    . cetirizine (ZYRTEC ALLERGY) 10 MG tablet Take 10 mg by mouth daily.    . Cholecalciferol (VITAMIN D) 2000 UNITS CAPS Take 1 capsule by mouth daily.    . Estradiol (ESTROGEL TD) Place onto the skin daily.    . fluticasone (FLONASE) 50 MCG/ACT nasal spray Place into the nose.    . gabapentin (NEURONTIN) 100 MG capsule TAKE TWO CAPSULES IN THE MORNING AND AT MID-DAY AND TAKE FOUR CAPSULES AT NIGHT 240 capsule 11  . levothyroxine (SYNTHROID) 50 MCG tablet Take 50 mcg by mouth daily before breakfast.     . losartan (COZAAR) 50 MG tablet Take 50 mg by mouth daily.     . Magnesium 250 MG TABS  Take 250 mg by mouth daily.    . pantoprazole (PROTONIX) 40 MG tablet Take 40 mg by mouth daily.    . rosuvastatin (CRESTOR) 5 MG tablet Take 5 mg by mouth daily.    . vitamin E 400 UNIT capsule Take 400 Units by mouth daily.      No facility-administered medications prior to visit.     PAST MEDICAL HISTORY: Past Medical History:  Diagnosis Date  . Allergy   . Anxiety   . Asthma    INFREQUENT PROBLEM - rarely used inhaler  . Cervical spondylosis   . Cervical strain 07/22/2014  . Complication of anesthesia    UNKNOWN REACTION WITH RASH AT SITE OF IV only with  knee surgery  . Degenerative arthritis   . Dyslipidemia   . Eczema   . GERD (gastroesophageal reflux disease)   . Guillain-Barre syndrome ([place])   . History of Guillain-Barre syndrome 07/22/2014  . Hyperlipidemia   . Hypertension   . Hypothyroidism   . Lumbosacral spondylosis   . Raynaud disease   . Thyroid nodule     PAST SURGICAL HISTORY: Past Surgical History:  Procedure Laterality Date  . ABDOMINAL HYSTERECTOMY    . COLONOSCOPY    . FOOT SURGERY Left   . GANGLION CYST EXCISION Left    Wrist  . HERNIA REPAIR    . KNEE ARTHROSCOPY     RT KNEE  . KNEE ARTHROTOMY Right 08/27/2013   Procedure: RIGHT KNEE SCAR EXCISION AND FEMORAL REVISION;  Surgeon: Gearlean Alf, MD;  Location: [facility];  Service: Orthopedics;  Laterality: Right;  . REPLACEMENT TOTAL KNEE BILATERAL Bilateral   . STRABISMUS SURGERY Right   . TONSILLECTOMY    . vaginal vulvo prolapse  09/2015    FAMILY HISTORY: Family History  Problem Relation Age of Onset  . Hypertension Mother   . Other Mother        Dyslipidemia  . Heart disease Father   . Other Brother        Dyslipidemia  . Stomach cancer Paternal Uncle   . Colon cancer Neg Hx   . Rectal cancer Neg Hx   . Esophageal cancer Neg Hx     SOCIAL HISTORY: Social History   Social History  . Marital status: Married    Spouse name: N/A  . Number of children: 2  . Years of education: college   Occupational History  .  Unemployed   Social History Main Topics  . Smoking status: Never Smoker  . Smokeless tobacco: Never Used  . Alcohol use 3.6 oz/week    6 Glasses of wine per week     Comment: wine weekly  . Drug use: No  . Sexual activity: Yes    Birth control/ protection: Other-see comments, Post-menopausal     Comment: Hysterectomy   Other Topics Concern  . Not on file   Social History Narrative   Lives at home, married   Patient is right handed.   Patient drinks 2-3 cups caffeine daily.      PHYSICAL EXAM  Vitals:    04/17/17 1035  BP: 131/68  Pulse: 64  Weight: 126 lb 4.8 oz (57.3 kg)  Height: 5\' 4"  (1.626 m)   Body mass index is 21.68 kg/m.  Generalized: Well developed, in no acute distress   Neurological examination  Mentation: Alert oriented to time, place, history taking. Follows all commands speech and language fluent Cranial nerve II-XII: Pupils were equal round reactive to light. Extraocular movements  were full, visual field were full on confrontational test. Facial sensation and strength were normal. Uvula tongue midline. Head turning and shoulder shrug  were normal and symmetric. Motor: Good strength throughout. Atrophy left gastrocnemius muscle Sensory: Sensory testing is intact to soft touch on all 4 extremities. No evidence of extinction is noted.  Coordination: Cerebellar testing reveals good finger-nose-finger and heel-to-shin bilaterally.  Gait and station: Gait is normal. Tandem gait is normal. Romberg is negative. No drift is seen.  Reflexes: Deep tendon reflexes are symmetric and normal bilaterally.   DIAGNOSTIC DATA (LABS, IMAGING, TESTING) - I reviewed patient records, labs, notes, testing and imaging myself where available.  Lab Results  Component Value Date   WBC 8.2 08/29/2013   HGB 12.3 08/29/2013   HCT 36.8 08/29/2013   MCV 97.9 08/29/2013   PLT 153 08/29/2013      Component Value Date/Time   NA 138 08/29/2013 0405   K 3.5 08/29/2013 0405   CL 103 08/29/2013 0405   CO2 27 08/29/2013 0405   GLUCOSE 105 (H) 08/29/2013 0405   BUN 14 08/29/2013 0405   CREATININE 0.60 08/29/2013 0405   CALCIUM 9.8 08/29/2013 0405   PROT 7.0 07/26/2011 1600   ALBUMIN 4.1 07/26/2011 1600   AST 27 07/26/2011 1600   ALT 22 07/26/2011 1600   ALKPHOS 83 07/26/2011 1600   BILITOT 0.2 (L) 07/26/2011 1600   GFRNONAA >90 08/29/2013 0405   GFRAA >90 08/29/2013 0405       ASSESSMENT AND PLAN 66 y.o. year old female  has a past medical history of Allergy; Anxiety; Asthma; Cervical  spondylosis; Cervical strain (07/22/2014); Complication of anesthesia; Degenerative arthritis; Dyslipidemia; Eczema; GERD (gastroesophageal reflux disease); Guillain-Barre syndrome ([place]); History of Guillain-Barre syndrome (07/22/2014); Hyperlipidemia; Hypertension; Hypothyroidism; Lumbosacral spondylosis; Raynaud disease; and Thyroid nodule. here with:  1. History of Lonia Blood syndrome 2. Chronic back pain 3. Atrophy of the left gastrocnemius  muscle  Overall the patient has remained stable. She is encouraged to continue strength training with her trainer. She will continue on gabapentin. She is advised that if her symptoms worsen or she develops new symptoms she should let us know. She will follow-up in 6 months with Dr. Jannifer Franklin.    Ward Givens, MSN, NP-C 04/17/2017, 10:25 AM [facility] [address], [place], [place] [address] [phone]

## 2017-04-19 DIAGNOSIS — J3089 Other allergic rhinitis: Secondary | ICD-10-CM | POA: Diagnosis not present

## 2017-04-20 DIAGNOSIS — J3089 Other allergic rhinitis: Secondary | ICD-10-CM | POA: Diagnosis not present

## 2017-04-23 ENCOUNTER — Telehealth: Payer: Self-pay | Admitting: Rheumatology

## 2017-04-23 NOTE — Telephone Encounter (Signed)
It's under review

## 2017-04-23 NOTE — Telephone Encounter (Signed)
Patient called checking to see if we have received a referral for her and what the status is.  Thank you.

## 2017-04-25 DIAGNOSIS — J3089 Other allergic rhinitis: Secondary | ICD-10-CM | POA: Diagnosis not present

## 2017-04-26 DIAGNOSIS — Z0000 Encounter for general adult medical examination without abnormal findings: Secondary | ICD-10-CM | POA: Diagnosis not present

## 2017-04-26 DIAGNOSIS — I10 Essential (primary) hypertension: Secondary | ICD-10-CM | POA: Diagnosis not present

## 2017-04-26 DIAGNOSIS — E7800 Pure hypercholesterolemia, unspecified: Secondary | ICD-10-CM | POA: Diagnosis not present

## 2017-04-26 DIAGNOSIS — G650 Sequelae of Guillain-Barre syndrome: Secondary | ICD-10-CM | POA: Diagnosis not present

## 2017-04-26 DIAGNOSIS — Z13820 Encounter for screening for osteoporosis: Secondary | ICD-10-CM | POA: Diagnosis not present

## 2017-04-26 DIAGNOSIS — H04129 Dry eye syndrome of unspecified lacrimal gland: Secondary | ICD-10-CM | POA: Diagnosis not present

## 2017-04-26 DIAGNOSIS — R682 Dry mouth, unspecified: Secondary | ICD-10-CM | POA: Diagnosis not present

## 2017-04-26 DIAGNOSIS — I7300 Raynaud's syndrome without gangrene: Secondary | ICD-10-CM | POA: Diagnosis not present

## 2017-04-26 DIAGNOSIS — M8580 Other specified disorders of bone density and structure, unspecified site: Secondary | ICD-10-CM | POA: Diagnosis not present

## 2017-04-26 DIAGNOSIS — G629 Polyneuropathy, unspecified: Secondary | ICD-10-CM | POA: Diagnosis not present

## 2017-04-26 DIAGNOSIS — Z1159 Encounter for screening for other viral diseases: Secondary | ICD-10-CM | POA: Diagnosis not present

## 2017-04-26 DIAGNOSIS — E039 Hypothyroidism, unspecified: Secondary | ICD-10-CM | POA: Diagnosis not present

## 2017-04-26 DIAGNOSIS — R7301 Impaired fasting glucose: Secondary | ICD-10-CM | POA: Diagnosis not present

## 2017-04-26 DIAGNOSIS — R7309 Other abnormal glucose: Secondary | ICD-10-CM | POA: Diagnosis not present

## 2017-05-02 DIAGNOSIS — J3089 Other allergic rhinitis: Secondary | ICD-10-CM | POA: Diagnosis not present

## 2017-05-07 DIAGNOSIS — J3089 Other allergic rhinitis: Secondary | ICD-10-CM | POA: Diagnosis not present

## 2017-05-11 DIAGNOSIS — J3089 Other allergic rhinitis: Secondary | ICD-10-CM | POA: Diagnosis not present

## 2017-05-16 DIAGNOSIS — J3089 Other allergic rhinitis: Secondary | ICD-10-CM | POA: Diagnosis not present

## 2017-05-29 DIAGNOSIS — J3089 Other allergic rhinitis: Secondary | ICD-10-CM | POA: Diagnosis not present

## 2017-06-01 DIAGNOSIS — J3089 Other allergic rhinitis: Secondary | ICD-10-CM | POA: Diagnosis not present

## 2017-06-08 DIAGNOSIS — J3089 Other allergic rhinitis: Secondary | ICD-10-CM | POA: Diagnosis not present

## 2017-06-14 DIAGNOSIS — J3089 Other allergic rhinitis: Secondary | ICD-10-CM | POA: Diagnosis not present

## 2017-06-20 ENCOUNTER — Other Ambulatory Visit: Payer: Self-pay | Admitting: Endocrinology

## 2017-06-20 DIAGNOSIS — E049 Nontoxic goiter, unspecified: Secondary | ICD-10-CM

## 2017-06-21 DIAGNOSIS — J3089 Other allergic rhinitis: Secondary | ICD-10-CM | POA: Diagnosis not present

## 2017-06-27 DIAGNOSIS — J3089 Other allergic rhinitis: Secondary | ICD-10-CM | POA: Diagnosis not present

## 2017-07-06 DIAGNOSIS — J301 Allergic rhinitis due to pollen: Secondary | ICD-10-CM | POA: Diagnosis not present

## 2017-07-13 DIAGNOSIS — J3089 Other allergic rhinitis: Secondary | ICD-10-CM | POA: Diagnosis not present

## 2017-07-17 DIAGNOSIS — J019 Acute sinusitis, unspecified: Secondary | ICD-10-CM | POA: Diagnosis not present

## 2017-07-17 DIAGNOSIS — J3089 Other allergic rhinitis: Secondary | ICD-10-CM | POA: Diagnosis not present

## 2017-07-17 DIAGNOSIS — R062 Wheezing: Secondary | ICD-10-CM | POA: Diagnosis not present

## 2017-07-20 DIAGNOSIS — J3089 Other allergic rhinitis: Secondary | ICD-10-CM | POA: Diagnosis not present

## 2017-07-25 DIAGNOSIS — H04123 Dry eye syndrome of bilateral lacrimal glands: Secondary | ICD-10-CM | POA: Diagnosis not present

## 2017-07-25 DIAGNOSIS — H2513 Age-related nuclear cataract, bilateral: Secondary | ICD-10-CM | POA: Diagnosis not present

## 2017-07-25 DIAGNOSIS — H5213 Myopia, bilateral: Secondary | ICD-10-CM | POA: Diagnosis not present

## 2017-07-25 DIAGNOSIS — H16223 Keratoconjunctivitis sicca, not specified as Sjogren's, bilateral: Secondary | ICD-10-CM | POA: Diagnosis not present

## 2017-07-25 DIAGNOSIS — H524 Presbyopia: Secondary | ICD-10-CM | POA: Diagnosis not present

## 2017-07-30 DIAGNOSIS — M3500 Sicca syndrome, unspecified: Secondary | ICD-10-CM | POA: Diagnosis not present

## 2017-07-30 DIAGNOSIS — Z6820 Body mass index (BMI) 20.0-20.9, adult: Secondary | ICD-10-CM | POA: Diagnosis not present

## 2017-07-30 DIAGNOSIS — E039 Hypothyroidism, unspecified: Secondary | ICD-10-CM | POA: Diagnosis not present

## 2017-07-30 DIAGNOSIS — M2550 Pain in unspecified joint: Secondary | ICD-10-CM | POA: Diagnosis not present

## 2017-07-30 DIAGNOSIS — I7300 Raynaud's syndrome without gangrene: Secondary | ICD-10-CM | POA: Diagnosis not present

## 2017-07-31 DIAGNOSIS — J3089 Other allergic rhinitis: Secondary | ICD-10-CM | POA: Diagnosis not present

## 2017-08-01 ENCOUNTER — Ambulatory Visit
Admission: RE | Admit: 2017-08-01 | Discharge: 2017-08-01 | Disposition: A | Discharge status: Home / Self Care | Payer: PPO | Source: Ambulatory Visit | Attending: Endocrinology | Admitting: Endocrinology

## 2017-08-01 DIAGNOSIS — E049 Nontoxic goiter, unspecified: Secondary | ICD-10-CM

## 2017-08-01 DIAGNOSIS — E041 Nontoxic single thyroid nodule: Secondary | ICD-10-CM | POA: Diagnosis not present

## 2017-08-06 DIAGNOSIS — E049 Nontoxic goiter, unspecified: Secondary | ICD-10-CM | POA: Diagnosis not present

## 2017-08-06 DIAGNOSIS — E039 Hypothyroidism, unspecified: Secondary | ICD-10-CM | POA: Diagnosis not present

## 2017-08-09 DIAGNOSIS — J3089 Other allergic rhinitis: Secondary | ICD-10-CM | POA: Diagnosis not present

## 2017-08-13 DIAGNOSIS — M3500 Sicca syndrome, unspecified: Secondary | ICD-10-CM | POA: Diagnosis not present

## 2017-08-13 DIAGNOSIS — M150 Primary generalized (osteo)arthritis: Secondary | ICD-10-CM | POA: Diagnosis not present

## 2017-08-13 DIAGNOSIS — Z6820 Body mass index (BMI) 20.0-20.9, adult: Secondary | ICD-10-CM | POA: Diagnosis not present

## 2017-08-13 DIAGNOSIS — I7300 Raynaud's syndrome without gangrene: Secondary | ICD-10-CM | POA: Diagnosis not present

## 2017-08-17 DIAGNOSIS — J3089 Other allergic rhinitis: Secondary | ICD-10-CM | POA: Diagnosis not present

## 2017-08-28 DIAGNOSIS — J3089 Other allergic rhinitis: Secondary | ICD-10-CM | POA: Diagnosis not present

## 2017-09-04 DIAGNOSIS — J3089 Other allergic rhinitis: Secondary | ICD-10-CM | POA: Diagnosis not present

## 2017-09-13 DIAGNOSIS — J3089 Other allergic rhinitis: Secondary | ICD-10-CM | POA: Diagnosis not present

## 2017-09-21 ENCOUNTER — Ambulatory Visit: Payer: PPO | Admitting: Psychology

## 2017-09-21 DIAGNOSIS — F411 Generalized anxiety disorder: Secondary | ICD-10-CM

## 2017-09-21 DIAGNOSIS — J3089 Other allergic rhinitis: Secondary | ICD-10-CM | POA: Diagnosis not present

## 2017-09-27 DIAGNOSIS — J3089 Other allergic rhinitis: Secondary | ICD-10-CM | POA: Diagnosis not present

## 2017-10-02 DIAGNOSIS — J3089 Other allergic rhinitis: Secondary | ICD-10-CM | POA: Diagnosis not present

## 2017-10-05 DIAGNOSIS — J3089 Other allergic rhinitis: Secondary | ICD-10-CM | POA: Diagnosis not present

## 2017-10-11 DIAGNOSIS — J3089 Other allergic rhinitis: Secondary | ICD-10-CM | POA: Diagnosis not present

## 2017-10-13 ENCOUNTER — Other Ambulatory Visit: Payer: Self-pay | Admitting: Neurology

## 2017-10-22 DIAGNOSIS — J3089 Other allergic rhinitis: Secondary | ICD-10-CM | POA: Diagnosis not present

## 2017-10-25 ENCOUNTER — Ambulatory Visit (INDEPENDENT_AMBULATORY_CARE_PROVIDER_SITE_OTHER): Payer: PPO | Admitting: Psychology

## 2017-10-25 DIAGNOSIS — J3089 Other allergic rhinitis: Secondary | ICD-10-CM | POA: Diagnosis not present

## 2017-10-25 DIAGNOSIS — F411 Generalized anxiety disorder: Secondary | ICD-10-CM | POA: Diagnosis not present

## 2017-10-31 ENCOUNTER — Ambulatory Visit (INDEPENDENT_AMBULATORY_CARE_PROVIDER_SITE_OTHER): Payer: PPO | Admitting: Neurology

## 2017-10-31 ENCOUNTER — Encounter: Payer: Self-pay | Admitting: Neurology

## 2017-10-31 VITALS — BP 147/78 | HR 53 | Ht 64.0 in | Wt 121.5 lb

## 2017-10-31 DIAGNOSIS — I10 Essential (primary) hypertension: Secondary | ICD-10-CM | POA: Diagnosis not present

## 2017-10-31 DIAGNOSIS — E7800 Pure hypercholesterolemia, unspecified: Secondary | ICD-10-CM | POA: Diagnosis not present

## 2017-10-31 DIAGNOSIS — J3089 Other allergic rhinitis: Secondary | ICD-10-CM | POA: Diagnosis not present

## 2017-10-31 DIAGNOSIS — G610 Guillain-Barre syndrome: Secondary | ICD-10-CM | POA: Diagnosis not present

## 2017-10-31 DIAGNOSIS — E039 Hypothyroidism, unspecified: Secondary | ICD-10-CM | POA: Diagnosis not present

## 2017-10-31 DIAGNOSIS — Z1389 Encounter for screening for other disorder: Secondary | ICD-10-CM | POA: Diagnosis not present

## 2017-10-31 DIAGNOSIS — M791 Myalgia, unspecified site: Secondary | ICD-10-CM | POA: Diagnosis not present

## 2017-10-31 DIAGNOSIS — M3500 Sicca syndrome, unspecified: Secondary | ICD-10-CM | POA: Diagnosis not present

## 2017-10-31 DIAGNOSIS — I7300 Raynaud's syndrome without gangrene: Secondary | ICD-10-CM | POA: Diagnosis not present

## 2017-10-31 NOTE — Progress Notes (Signed)
Reason for visit: History of Guillain-Barr syndrome  Brianna Gates is an 67 y.o. female  History of present illness:  Brianna Gates is a 67 year old right-handed white female with a history of Guillain-Barr syndrome in the past.  The patient has had some residual numbness of the feet, left greater than right with some atrophy of the left gastrocnemius muscle.  The patient has been exercising on a regular basis, she uses a trainer.  She does have some occasional tingling on the top of the foot on the left.  She does have some arthritis issues, she feels weak at times.  She denies any balance issues or falls.  She has recently had a neuropsychological evaluation for some mild cognitive issues.  She may be seeing a neuropsychologist in the near future.  The patient returns to this office for an evaluation.  Past Medical History:  Diagnosis Date  . Allergy   . Anxiety   . Asthma    INFREQUENT PROBLEM - rarely used inhaler  . Cervical spondylosis   . Cervical strain 07/22/2014  . Complication of anesthesia    UNKNOWN REACTION WITH RASH AT SITE OF IV only with knee surgery  . Degenerative arthritis   . Dyslipidemia   . Eczema   . GERD (gastroesophageal reflux disease)   . Guillain-Barre syndrome ([place])   . History of Guillain-Barre syndrome 07/22/2014  . Hyperlipidemia   . Hypertension   . Hypothyroidism   . Lumbosacral spondylosis   . Raynaud disease   . Thyroid nodule     Past Surgical History:  Procedure Laterality Date  . ABDOMINAL HYSTERECTOMY    . COLONOSCOPY    . FOOT SURGERY Left   . GANGLION CYST EXCISION Left    Wrist  . HERNIA REPAIR    . KNEE ARTHROSCOPY     RT KNEE  . KNEE ARTHROTOMY Right 08/27/2013   Procedure: RIGHT KNEE SCAR EXCISION AND FEMORAL REVISION;  Surgeon: Gearlean Alf, MD;  Location: [facility];  Service: Orthopedics;  Laterality: Right;  . REPLACEMENT TOTAL KNEE BILATERAL Bilateral   . STRABISMUS SURGERY Right   . TONSILLECTOMY    . vaginal  vulvo prolapse  09/2015    Family History  Problem Relation Age of Onset  . Hypertension Mother   . Other Mother        Dyslipidemia  . Heart disease Father   . Other Brother        Dyslipidemia  . Stomach cancer Paternal Uncle   . Colon cancer Neg Hx   . Rectal cancer Neg Hx   . Esophageal cancer Neg Hx     Social history:  reports that  has never smoked. she has never used smokeless tobacco. She reports that she drinks about 3.6 oz of alcohol per week. She reports that she does not use drugs.    Allergies  Allergen Reactions  . Altace [Ramipril] Cough  . Buspar [Buspirone] Itching    Vivid dreams, diarrhea   . Captopril   . Daypro [Oxaprozin] Hives  . Zoloft [Sertraline Hcl] Diarrhea  . Ace Inhibitors     Other reaction(s): Other (See Comments) GB syndrome  . Influenza Vaccines     HX   . Sulfa Antibiotics Other (See Comments)    unknown    Medications:  Prior to Admission medications   Medication Sig Start Date End Date Taking? Authorizing Provider  albuterol (PROVENTIL HFA;VENTOLIN HFA) 108 (90 BASE) MCG/ACT inhaler Inhale 2 puffs into the lungs  every 6 (six) hours as needed for wheezing or shortness of breath.   Yes [provider]  Carboxymethylcellulose Sodium (EYE DROPS OP) Apply 1 drop to eye 2 (two) times daily. RETAIN EYE DROPS- GETS FROM OPTOMETRIST   Yes [provider]  Cholecalciferol (VITAMIN D) 2000 UNITS CAPS Take 1 capsule by mouth daily.   Yes [provider]  Estradiol (ESTROGEL TD) Place onto the skin daily.   Yes [provider]  gabapentin (NEURONTIN) 100 MG capsule TAKE 2 CAPSULES IN THE MORNING AND AT MID-DAY AND TAKE FOUR CAPSULES AT NIGHT 10/17/17  Yes Ward Givens, NP  levothyroxine (SYNTHROID) 50 MCG tablet Take 50 mcg by mouth daily before breakfast.    Yes [provider]  losartan (COZAAR) 50 MG tablet Take 50 mg by mouth daily.    Yes [provider]  Magnesium 250 MG TABS Take 250  mg by mouth daily.   Yes [provider]  Multiple Vitamins-Minerals (EYE VITAMINS PO) Take 4 tablets by mouth daily. HYDRO EYES   Yes [provider]  rosuvastatin (CRESTOR) 5 MG tablet Take 5 mg by mouth daily.   Yes [provider]    ROS:  Out of a complete 14 system review of symptoms, the patient complains only of the following symptoms, and all other reviewed systems are negative.  Fatigue Difficulty swallowing Eye discharge Cold intolerance Joint pain, joint swelling, back pain, aching muscles, muscle cramps, neck pain, neck stiffness  Blood pressure (!) 147/78, pulse (!) 53, height 5\' 4"  (1.626 m), weight 121 lb 8 oz (55.1 kg).  Physical Exam  General: The patient is alert and cooperative at the time of the examination.  Skin: No significant peripheral edema is noted.   Neurologic Exam  Mental status: The patient is alert and oriented x 3 at the time of the examination. The patient has apparent normal recent and remote memory, with an apparently normal attention span and concentration ability.   Cranial nerves: Facial symmetry is present. Speech is normal, no aphasia or dysarthria is noted. Extraocular movements are full. Visual fields are full.  Motor: The patient has good strength in all 4 extremities, with exception some mild weakness of the intrinsic muscles of the hands bilaterally.  Sensory examination: Soft touch sensation is symmetric on the face, arms, and legs.  Coordination: The patient has good finger-nose-finger and heel-to-shin bilaterally.  Gait and station: The patient has a normal gait. Tandem gait is normal. Romberg is negative. No drift is seen.  The patient is able to walk on the heels and toes bilaterally but has some difficulty walking on the toes on the left and on the heel on the right.  Reflexes: Deep tendon reflexes are symmetric, but are depressed.   MRI lumbar 01/08/17:   IMPRESSION:  Abnormal MRI lumbar spine  (without) demonstrating: 1. At L2-3, L3-4: disc bulging and facet hypertrophy with mild spinal stenosis or foraminal narrowing 2. At L4-5: pseudo-disc bulging and facet hypertrophy with no spinal stenosis or foraminal narrowing    Assessment/Plan:  1.  History of Guillain-Barr syndrome  The patient is overall doing fairly well.  Her clinical examination seems to be fairly stable, the patient continues to be concerned about some tingling in the feet and atrophy of the left calf muscle.  This appears to be a stable issue, MRI of the low back done previously but did not show any spinal stenosis or nerve root impingement.  The patient will follow-up in 1 year,  sooner if needed.  Jill Alexanders MD 10/31/2017 11:57 AM  [facility] [address] [place], [place] [address]  Phone [phone] Fax [phone]

## 2017-11-07 DIAGNOSIS — J3089 Other allergic rhinitis: Secondary | ICD-10-CM | POA: Diagnosis not present

## 2017-11-15 DIAGNOSIS — J3089 Other allergic rhinitis: Secondary | ICD-10-CM | POA: Diagnosis not present

## 2017-11-21 DIAGNOSIS — J3089 Other allergic rhinitis: Secondary | ICD-10-CM | POA: Diagnosis not present

## 2017-11-21 DIAGNOSIS — J301 Allergic rhinitis due to pollen: Secondary | ICD-10-CM | POA: Diagnosis not present

## 2017-11-23 ENCOUNTER — Ambulatory Visit: Payer: PPO | Admitting: Psychology

## 2017-11-23 DIAGNOSIS — H2513 Age-related nuclear cataract, bilateral: Secondary | ICD-10-CM | POA: Diagnosis not present

## 2017-11-23 DIAGNOSIS — H04123 Dry eye syndrome of bilateral lacrimal glands: Secondary | ICD-10-CM | POA: Diagnosis not present

## 2017-11-23 DIAGNOSIS — F418 Other specified anxiety disorders: Secondary | ICD-10-CM

## 2017-11-23 DIAGNOSIS — H16223 Keratoconjunctivitis sicca, not specified as Sjogren's, bilateral: Secondary | ICD-10-CM | POA: Diagnosis not present

## 2017-11-26 DIAGNOSIS — M50323 Other cervical disc degeneration at C6-C7 level: Secondary | ICD-10-CM | POA: Diagnosis not present

## 2017-11-26 DIAGNOSIS — J3089 Other allergic rhinitis: Secondary | ICD-10-CM | POA: Diagnosis not present

## 2017-11-26 DIAGNOSIS — M7542 Impingement syndrome of left shoulder: Secondary | ICD-10-CM | POA: Diagnosis not present

## 2017-12-11 DIAGNOSIS — J3089 Other allergic rhinitis: Secondary | ICD-10-CM | POA: Diagnosis not present

## 2017-12-13 DIAGNOSIS — M25512 Pain in left shoulder: Secondary | ICD-10-CM | POA: Diagnosis not present

## 2017-12-13 DIAGNOSIS — E039 Hypothyroidism, unspecified: Secondary | ICD-10-CM | POA: Diagnosis not present

## 2017-12-13 DIAGNOSIS — M5030 Other cervical disc degeneration, unspecified cervical region: Secondary | ICD-10-CM | POA: Diagnosis not present

## 2017-12-13 DIAGNOSIS — M1990 Unspecified osteoarthritis, unspecified site: Secondary | ICD-10-CM | POA: Diagnosis not present

## 2017-12-13 DIAGNOSIS — I10 Essential (primary) hypertension: Secondary | ICD-10-CM | POA: Diagnosis not present

## 2017-12-19 DIAGNOSIS — J3089 Other allergic rhinitis: Secondary | ICD-10-CM | POA: Diagnosis not present

## 2017-12-20 ENCOUNTER — Ambulatory Visit (INDEPENDENT_AMBULATORY_CARE_PROVIDER_SITE_OTHER): Payer: PPO | Admitting: Psychology

## 2017-12-20 DIAGNOSIS — F418 Other specified anxiety disorders: Secondary | ICD-10-CM

## 2017-12-21 DIAGNOSIS — M7542 Impingement syndrome of left shoulder: Secondary | ICD-10-CM | POA: Diagnosis not present

## 2017-12-21 DIAGNOSIS — M5030 Other cervical disc degeneration, unspecified cervical region: Secondary | ICD-10-CM | POA: Diagnosis not present

## 2017-12-26 DIAGNOSIS — J3089 Other allergic rhinitis: Secondary | ICD-10-CM | POA: Diagnosis not present

## 2018-01-01 DIAGNOSIS — M5033 Other cervical disc degeneration, cervicothoracic region: Secondary | ICD-10-CM | POA: Diagnosis not present

## 2018-01-01 DIAGNOSIS — M5030 Other cervical disc degeneration, unspecified cervical region: Secondary | ICD-10-CM | POA: Diagnosis not present

## 2018-01-02 DIAGNOSIS — J3089 Other allergic rhinitis: Secondary | ICD-10-CM | POA: Diagnosis not present

## 2018-01-02 DIAGNOSIS — M7542 Impingement syndrome of left shoulder: Secondary | ICD-10-CM | POA: Diagnosis not present

## 2018-01-03 ENCOUNTER — Ambulatory Visit: Payer: PPO | Admitting: Psychology

## 2018-01-03 DIAGNOSIS — F419 Anxiety disorder, unspecified: Secondary | ICD-10-CM

## 2018-01-07 DIAGNOSIS — M1990 Unspecified osteoarthritis, unspecified site: Secondary | ICD-10-CM | POA: Diagnosis not present

## 2018-01-07 DIAGNOSIS — I10 Essential (primary) hypertension: Secondary | ICD-10-CM | POA: Diagnosis not present

## 2018-01-07 DIAGNOSIS — E039 Hypothyroidism, unspecified: Secondary | ICD-10-CM | POA: Diagnosis not present

## 2018-01-09 DIAGNOSIS — J3089 Other allergic rhinitis: Secondary | ICD-10-CM | POA: Diagnosis not present

## 2018-01-15 DIAGNOSIS — M7542 Impingement syndrome of left shoulder: Secondary | ICD-10-CM | POA: Diagnosis not present

## 2018-01-15 DIAGNOSIS — J3089 Other allergic rhinitis: Secondary | ICD-10-CM | POA: Diagnosis not present

## 2018-01-15 DIAGNOSIS — M7540 Impingement syndrome of unspecified shoulder: Secondary | ICD-10-CM | POA: Insufficient documentation

## 2018-01-16 DIAGNOSIS — M47812 Spondylosis without myelopathy or radiculopathy, cervical region: Secondary | ICD-10-CM | POA: Diagnosis not present

## 2018-01-17 ENCOUNTER — Ambulatory Visit: Payer: PPO | Admitting: Psychology

## 2018-01-17 DIAGNOSIS — F418 Other specified anxiety disorders: Secondary | ICD-10-CM | POA: Diagnosis not present

## 2018-01-18 DIAGNOSIS — M25512 Pain in left shoulder: Secondary | ICD-10-CM | POA: Diagnosis not present

## 2018-01-23 DIAGNOSIS — M7542 Impingement syndrome of left shoulder: Secondary | ICD-10-CM | POA: Diagnosis not present

## 2018-01-24 DIAGNOSIS — J301 Allergic rhinitis due to pollen: Secondary | ICD-10-CM | POA: Diagnosis not present

## 2018-01-25 DIAGNOSIS — E7800 Pure hypercholesterolemia, unspecified: Secondary | ICD-10-CM | POA: Diagnosis not present

## 2018-01-25 DIAGNOSIS — G629 Polyneuropathy, unspecified: Secondary | ICD-10-CM | POA: Diagnosis not present

## 2018-01-25 DIAGNOSIS — G650 Sequelae of Guillain-Barre syndrome: Secondary | ICD-10-CM | POA: Diagnosis not present

## 2018-01-25 DIAGNOSIS — J309 Allergic rhinitis, unspecified: Secondary | ICD-10-CM | POA: Diagnosis not present

## 2018-01-25 DIAGNOSIS — I7300 Raynaud's syndrome without gangrene: Secondary | ICD-10-CM | POA: Diagnosis not present

## 2018-01-25 DIAGNOSIS — M3500 Sicca syndrome, unspecified: Secondary | ICD-10-CM | POA: Diagnosis not present

## 2018-01-25 DIAGNOSIS — I10 Essential (primary) hypertension: Secondary | ICD-10-CM | POA: Diagnosis not present

## 2018-01-25 DIAGNOSIS — M1990 Unspecified osteoarthritis, unspecified site: Secondary | ICD-10-CM | POA: Diagnosis not present

## 2018-01-25 DIAGNOSIS — E039 Hypothyroidism, unspecified: Secondary | ICD-10-CM | POA: Diagnosis not present

## 2018-01-25 DIAGNOSIS — M8580 Other specified disorders of bone density and structure, unspecified site: Secondary | ICD-10-CM | POA: Diagnosis not present

## 2018-01-30 DIAGNOSIS — J3089 Other allergic rhinitis: Secondary | ICD-10-CM | POA: Diagnosis not present

## 2018-01-31 ENCOUNTER — Ambulatory Visit (INDEPENDENT_AMBULATORY_CARE_PROVIDER_SITE_OTHER): Payer: PPO | Admitting: Psychology

## 2018-01-31 DIAGNOSIS — F418 Other specified anxiety disorders: Secondary | ICD-10-CM | POA: Diagnosis not present

## 2018-02-06 DIAGNOSIS — M7542 Impingement syndrome of left shoulder: Secondary | ICD-10-CM | POA: Diagnosis not present

## 2018-02-06 DIAGNOSIS — M19012 Primary osteoarthritis, left shoulder: Secondary | ICD-10-CM | POA: Diagnosis not present

## 2018-02-06 DIAGNOSIS — M75122 Complete rotator cuff tear or rupture of left shoulder, not specified as traumatic: Secondary | ICD-10-CM | POA: Diagnosis not present

## 2018-02-06 DIAGNOSIS — J301 Allergic rhinitis due to pollen: Secondary | ICD-10-CM | POA: Diagnosis not present

## 2018-02-09 DIAGNOSIS — M47812 Spondylosis without myelopathy or radiculopathy, cervical region: Secondary | ICD-10-CM | POA: Diagnosis not present

## 2018-02-11 DIAGNOSIS — Z6820 Body mass index (BMI) 20.0-20.9, adult: Secondary | ICD-10-CM | POA: Diagnosis not present

## 2018-02-11 DIAGNOSIS — M3500 Sicca syndrome, unspecified: Secondary | ICD-10-CM | POA: Diagnosis not present

## 2018-02-11 DIAGNOSIS — I7300 Raynaud's syndrome without gangrene: Secondary | ICD-10-CM | POA: Diagnosis not present

## 2018-02-11 DIAGNOSIS — M150 Primary generalized (osteo)arthritis: Secondary | ICD-10-CM | POA: Diagnosis not present

## 2018-02-15 DIAGNOSIS — J3089 Other allergic rhinitis: Secondary | ICD-10-CM | POA: Diagnosis not present

## 2018-02-18 ENCOUNTER — Ambulatory Visit (INDEPENDENT_AMBULATORY_CARE_PROVIDER_SITE_OTHER): Payer: PPO | Admitting: Psychology

## 2018-02-18 DIAGNOSIS — F418 Other specified anxiety disorders: Secondary | ICD-10-CM

## 2018-02-19 DIAGNOSIS — J3089 Other allergic rhinitis: Secondary | ICD-10-CM | POA: Diagnosis not present

## 2018-02-27 DIAGNOSIS — H43813 Vitreous degeneration, bilateral: Secondary | ICD-10-CM | POA: Diagnosis not present

## 2018-02-27 DIAGNOSIS — H16223 Keratoconjunctivitis sicca, not specified as Sjogren's, bilateral: Secondary | ICD-10-CM | POA: Diagnosis not present

## 2018-02-27 DIAGNOSIS — H2513 Age-related nuclear cataract, bilateral: Secondary | ICD-10-CM | POA: Diagnosis not present

## 2018-02-27 DIAGNOSIS — H04123 Dry eye syndrome of bilateral lacrimal glands: Secondary | ICD-10-CM | POA: Diagnosis not present

## 2018-02-28 DIAGNOSIS — J3089 Other allergic rhinitis: Secondary | ICD-10-CM | POA: Diagnosis not present

## 2018-03-07 DIAGNOSIS — J301 Allergic rhinitis due to pollen: Secondary | ICD-10-CM | POA: Diagnosis not present

## 2018-03-07 DIAGNOSIS — J3089 Other allergic rhinitis: Secondary | ICD-10-CM | POA: Diagnosis not present

## 2018-03-15 DIAGNOSIS — I10 Essential (primary) hypertension: Secondary | ICD-10-CM | POA: Diagnosis not present

## 2018-03-15 DIAGNOSIS — M1990 Unspecified osteoarthritis, unspecified site: Secondary | ICD-10-CM | POA: Diagnosis not present

## 2018-03-15 DIAGNOSIS — E039 Hypothyroidism, unspecified: Secondary | ICD-10-CM | POA: Diagnosis not present

## 2018-03-15 DIAGNOSIS — J301 Allergic rhinitis due to pollen: Secondary | ICD-10-CM | POA: Diagnosis not present

## 2018-03-15 DIAGNOSIS — J3089 Other allergic rhinitis: Secondary | ICD-10-CM | POA: Diagnosis not present

## 2018-03-18 ENCOUNTER — Ambulatory Visit (INDEPENDENT_AMBULATORY_CARE_PROVIDER_SITE_OTHER): Payer: PPO | Admitting: Psychology

## 2018-03-18 DIAGNOSIS — F418 Other specified anxiety disorders: Secondary | ICD-10-CM

## 2018-03-18 DIAGNOSIS — Z6821 Body mass index (BMI) 21.0-21.9, adult: Secondary | ICD-10-CM | POA: Diagnosis not present

## 2018-03-18 DIAGNOSIS — Z01419 Encounter for gynecological examination (general) (routine) without abnormal findings: Secondary | ICD-10-CM | POA: Diagnosis not present

## 2018-03-18 DIAGNOSIS — Z1231 Encounter for screening mammogram for malignant neoplasm of breast: Secondary | ICD-10-CM | POA: Diagnosis not present

## 2018-03-19 DIAGNOSIS — J3089 Other allergic rhinitis: Secondary | ICD-10-CM | POA: Diagnosis not present

## 2018-03-22 DIAGNOSIS — J3089 Other allergic rhinitis: Secondary | ICD-10-CM | POA: Diagnosis not present

## 2018-03-27 DIAGNOSIS — J3089 Other allergic rhinitis: Secondary | ICD-10-CM | POA: Diagnosis not present

## 2018-04-08 DIAGNOSIS — J3089 Other allergic rhinitis: Secondary | ICD-10-CM | POA: Diagnosis not present

## 2018-04-08 DIAGNOSIS — J301 Allergic rhinitis due to pollen: Secondary | ICD-10-CM | POA: Diagnosis not present

## 2018-04-11 ENCOUNTER — Ambulatory Visit (INDEPENDENT_AMBULATORY_CARE_PROVIDER_SITE_OTHER): Payer: PPO | Admitting: Psychology

## 2018-04-11 DIAGNOSIS — F418 Other specified anxiety disorders: Secondary | ICD-10-CM

## 2018-04-17 DIAGNOSIS — J3089 Other allergic rhinitis: Secondary | ICD-10-CM | POA: Diagnosis not present

## 2018-04-23 DIAGNOSIS — R062 Wheezing: Secondary | ICD-10-CM | POA: Diagnosis not present

## 2018-04-23 DIAGNOSIS — J301 Allergic rhinitis due to pollen: Secondary | ICD-10-CM | POA: Diagnosis not present

## 2018-04-23 DIAGNOSIS — J3089 Other allergic rhinitis: Secondary | ICD-10-CM | POA: Diagnosis not present

## 2018-04-29 ENCOUNTER — Ambulatory Visit (INDEPENDENT_AMBULATORY_CARE_PROVIDER_SITE_OTHER): Payer: PPO | Admitting: Psychology

## 2018-04-29 DIAGNOSIS — J3089 Other allergic rhinitis: Secondary | ICD-10-CM | POA: Diagnosis not present

## 2018-04-29 DIAGNOSIS — F418 Other specified anxiety disorders: Secondary | ICD-10-CM | POA: Diagnosis not present

## 2018-05-07 DIAGNOSIS — Z1389 Encounter for screening for other disorder: Secondary | ICD-10-CM | POA: Diagnosis not present

## 2018-05-07 DIAGNOSIS — I10 Essential (primary) hypertension: Secondary | ICD-10-CM | POA: Diagnosis not present

## 2018-05-07 DIAGNOSIS — G650 Sequelae of Guillain-Barre syndrome: Secondary | ICD-10-CM | POA: Diagnosis not present

## 2018-05-07 DIAGNOSIS — G629 Polyneuropathy, unspecified: Secondary | ICD-10-CM | POA: Diagnosis not present

## 2018-05-07 DIAGNOSIS — J309 Allergic rhinitis, unspecified: Secondary | ICD-10-CM | POA: Diagnosis not present

## 2018-05-07 DIAGNOSIS — M3500 Sicca syndrome, unspecified: Secondary | ICD-10-CM | POA: Diagnosis not present

## 2018-05-07 DIAGNOSIS — R7309 Other abnormal glucose: Secondary | ICD-10-CM | POA: Diagnosis not present

## 2018-05-07 DIAGNOSIS — E039 Hypothyroidism, unspecified: Secondary | ICD-10-CM | POA: Diagnosis not present

## 2018-05-07 DIAGNOSIS — E7800 Pure hypercholesterolemia, unspecified: Secondary | ICD-10-CM | POA: Diagnosis not present

## 2018-05-07 DIAGNOSIS — M8588 Other specified disorders of bone density and structure, other site: Secondary | ICD-10-CM | POA: Diagnosis not present

## 2018-05-07 DIAGNOSIS — J3089 Other allergic rhinitis: Secondary | ICD-10-CM | POA: Diagnosis not present

## 2018-05-07 DIAGNOSIS — M1990 Unspecified osteoarthritis, unspecified site: Secondary | ICD-10-CM | POA: Diagnosis not present

## 2018-05-07 DIAGNOSIS — I7300 Raynaud's syndrome without gangrene: Secondary | ICD-10-CM | POA: Diagnosis not present

## 2018-05-07 DIAGNOSIS — Z0000 Encounter for general adult medical examination without abnormal findings: Secondary | ICD-10-CM | POA: Diagnosis not present

## 2018-05-07 DIAGNOSIS — Z7189 Other specified counseling: Secondary | ICD-10-CM | POA: Diagnosis not present

## 2018-05-14 DIAGNOSIS — J3089 Other allergic rhinitis: Secondary | ICD-10-CM | POA: Diagnosis not present

## 2018-05-15 DIAGNOSIS — I10 Essential (primary) hypertension: Secondary | ICD-10-CM | POA: Diagnosis not present

## 2018-05-15 DIAGNOSIS — E039 Hypothyroidism, unspecified: Secondary | ICD-10-CM | POA: Diagnosis not present

## 2018-05-15 DIAGNOSIS — M1990 Unspecified osteoarthritis, unspecified site: Secondary | ICD-10-CM | POA: Diagnosis not present

## 2018-05-21 DIAGNOSIS — J3089 Other allergic rhinitis: Secondary | ICD-10-CM | POA: Diagnosis not present

## 2018-05-22 DIAGNOSIS — L570 Actinic keratosis: Secondary | ICD-10-CM | POA: Diagnosis not present

## 2018-05-22 DIAGNOSIS — L821 Other seborrheic keratosis: Secondary | ICD-10-CM | POA: Diagnosis not present

## 2018-05-22 DIAGNOSIS — L814 Other melanin hyperpigmentation: Secondary | ICD-10-CM | POA: Diagnosis not present

## 2018-05-28 DIAGNOSIS — J3089 Other allergic rhinitis: Secondary | ICD-10-CM | POA: Diagnosis not present

## 2018-05-30 DIAGNOSIS — E039 Hypothyroidism, unspecified: Secondary | ICD-10-CM | POA: Diagnosis not present

## 2018-05-30 DIAGNOSIS — I10 Essential (primary) hypertension: Secondary | ICD-10-CM | POA: Diagnosis not present

## 2018-05-30 DIAGNOSIS — M1990 Unspecified osteoarthritis, unspecified site: Secondary | ICD-10-CM | POA: Diagnosis not present

## 2018-05-31 ENCOUNTER — Ambulatory Visit (INDEPENDENT_AMBULATORY_CARE_PROVIDER_SITE_OTHER): Payer: PPO | Admitting: Psychology

## 2018-05-31 DIAGNOSIS — F418 Other specified anxiety disorders: Secondary | ICD-10-CM

## 2018-06-05 DIAGNOSIS — J3089 Other allergic rhinitis: Secondary | ICD-10-CM | POA: Diagnosis not present

## 2018-06-12 DIAGNOSIS — J3089 Other allergic rhinitis: Secondary | ICD-10-CM | POA: Diagnosis not present

## 2018-06-16 DIAGNOSIS — R55 Syncope and collapse: Secondary | ICD-10-CM | POA: Diagnosis not present

## 2018-06-16 DIAGNOSIS — S81812A Laceration without foreign body, left lower leg, initial encounter: Secondary | ICD-10-CM | POA: Diagnosis not present

## 2018-06-18 DIAGNOSIS — S81812A Laceration without foreign body, left lower leg, initial encounter: Secondary | ICD-10-CM | POA: Diagnosis not present

## 2018-06-18 DIAGNOSIS — R55 Syncope and collapse: Secondary | ICD-10-CM | POA: Diagnosis not present

## 2018-06-19 DIAGNOSIS — J3089 Other allergic rhinitis: Secondary | ICD-10-CM | POA: Diagnosis not present

## 2018-06-24 DIAGNOSIS — Z4802 Encounter for removal of sutures: Secondary | ICD-10-CM | POA: Diagnosis not present

## 2018-06-25 ENCOUNTER — Ambulatory Visit (INDEPENDENT_AMBULATORY_CARE_PROVIDER_SITE_OTHER): Payer: PPO | Admitting: Psychology

## 2018-06-25 DIAGNOSIS — F418 Other specified anxiety disorders: Secondary | ICD-10-CM | POA: Diagnosis not present

## 2018-06-26 DIAGNOSIS — J3089 Other allergic rhinitis: Secondary | ICD-10-CM | POA: Diagnosis not present

## 2018-07-03 DIAGNOSIS — J3089 Other allergic rhinitis: Secondary | ICD-10-CM | POA: Diagnosis not present

## 2018-07-10 DIAGNOSIS — J3089 Other allergic rhinitis: Secondary | ICD-10-CM | POA: Diagnosis not present

## 2018-07-15 ENCOUNTER — Ambulatory Visit (INDEPENDENT_AMBULATORY_CARE_PROVIDER_SITE_OTHER): Payer: PPO | Admitting: Psychology

## 2018-07-15 DIAGNOSIS — F418 Other specified anxiety disorders: Secondary | ICD-10-CM

## 2018-07-16 DIAGNOSIS — J3089 Other allergic rhinitis: Secondary | ICD-10-CM | POA: Diagnosis not present

## 2018-07-25 DIAGNOSIS — J3089 Other allergic rhinitis: Secondary | ICD-10-CM | POA: Diagnosis not present

## 2018-07-30 DIAGNOSIS — E039 Hypothyroidism, unspecified: Secondary | ICD-10-CM | POA: Diagnosis not present

## 2018-08-01 DIAGNOSIS — J3089 Other allergic rhinitis: Secondary | ICD-10-CM | POA: Diagnosis not present

## 2018-08-05 DIAGNOSIS — H52223 Regular astigmatism, bilateral: Secondary | ICD-10-CM | POA: Diagnosis not present

## 2018-08-05 DIAGNOSIS — H5213 Myopia, bilateral: Secondary | ICD-10-CM | POA: Diagnosis not present

## 2018-08-05 DIAGNOSIS — H524 Presbyopia: Secondary | ICD-10-CM | POA: Diagnosis not present

## 2018-08-07 DIAGNOSIS — J3089 Other allergic rhinitis: Secondary | ICD-10-CM | POA: Diagnosis not present

## 2018-08-09 ENCOUNTER — Ambulatory Visit (INDEPENDENT_AMBULATORY_CARE_PROVIDER_SITE_OTHER): Payer: PPO | Admitting: Psychology

## 2018-08-09 DIAGNOSIS — F418 Other specified anxiety disorders: Secondary | ICD-10-CM | POA: Diagnosis not present

## 2018-08-14 DIAGNOSIS — J3089 Other allergic rhinitis: Secondary | ICD-10-CM | POA: Diagnosis not present

## 2018-08-15 DIAGNOSIS — E039 Hypothyroidism, unspecified: Secondary | ICD-10-CM | POA: Diagnosis not present

## 2018-08-15 DIAGNOSIS — E049 Nontoxic goiter, unspecified: Secondary | ICD-10-CM | POA: Diagnosis not present

## 2018-08-15 DIAGNOSIS — I10 Essential (primary) hypertension: Secondary | ICD-10-CM | POA: Diagnosis not present

## 2018-08-16 ENCOUNTER — Other Ambulatory Visit: Payer: Self-pay | Admitting: Endocrinology

## 2018-08-16 DIAGNOSIS — E049 Nontoxic goiter, unspecified: Secondary | ICD-10-CM

## 2018-08-21 DIAGNOSIS — J3089 Other allergic rhinitis: Secondary | ICD-10-CM | POA: Diagnosis not present

## 2018-08-26 ENCOUNTER — Ambulatory Visit
Admission: RE | Admit: 2018-08-26 | Discharge: 2018-08-26 | Disposition: A | Discharge status: Home / Self Care | Payer: PPO | Source: Ambulatory Visit | Attending: Endocrinology | Admitting: Endocrinology

## 2018-08-26 DIAGNOSIS — E049 Nontoxic goiter, unspecified: Secondary | ICD-10-CM

## 2018-08-29 DIAGNOSIS — J3089 Other allergic rhinitis: Secondary | ICD-10-CM | POA: Diagnosis not present

## 2018-09-02 ENCOUNTER — Ambulatory Visit (INDEPENDENT_AMBULATORY_CARE_PROVIDER_SITE_OTHER): Payer: PPO | Admitting: Psychology

## 2018-09-02 DIAGNOSIS — F418 Other specified anxiety disorders: Secondary | ICD-10-CM | POA: Diagnosis not present

## 2018-09-02 DIAGNOSIS — J3089 Other allergic rhinitis: Secondary | ICD-10-CM | POA: Diagnosis not present

## 2018-09-05 DIAGNOSIS — J3089 Other allergic rhinitis: Secondary | ICD-10-CM | POA: Diagnosis not present

## 2018-09-10 DIAGNOSIS — J3089 Other allergic rhinitis: Secondary | ICD-10-CM | POA: Diagnosis not present

## 2018-09-18 DIAGNOSIS — J3089 Other allergic rhinitis: Secondary | ICD-10-CM | POA: Diagnosis not present

## 2018-09-20 ENCOUNTER — Ambulatory Visit (INDEPENDENT_AMBULATORY_CARE_PROVIDER_SITE_OTHER): Payer: PPO | Admitting: Psychology

## 2018-09-20 DIAGNOSIS — F418 Other specified anxiety disorders: Secondary | ICD-10-CM

## 2018-09-26 DIAGNOSIS — J3089 Other allergic rhinitis: Secondary | ICD-10-CM | POA: Diagnosis not present

## 2018-10-01 DIAGNOSIS — J3089 Other allergic rhinitis: Secondary | ICD-10-CM | POA: Diagnosis not present

## 2018-10-04 ENCOUNTER — Ambulatory Visit (INDEPENDENT_AMBULATORY_CARE_PROVIDER_SITE_OTHER): Payer: PPO | Admitting: Psychology

## 2018-10-04 DIAGNOSIS — F418 Other specified anxiety disorders: Secondary | ICD-10-CM

## 2018-10-10 DIAGNOSIS — J3089 Other allergic rhinitis: Secondary | ICD-10-CM | POA: Diagnosis not present

## 2018-10-16 DIAGNOSIS — G4730 Sleep apnea, unspecified: Secondary | ICD-10-CM | POA: Insufficient documentation

## 2018-10-18 DIAGNOSIS — J3089 Other allergic rhinitis: Secondary | ICD-10-CM | POA: Diagnosis not present

## 2018-10-25 DIAGNOSIS — J3089 Other allergic rhinitis: Secondary | ICD-10-CM | POA: Diagnosis not present

## 2018-10-29 ENCOUNTER — Ambulatory Visit (INDEPENDENT_AMBULATORY_CARE_PROVIDER_SITE_OTHER): Payer: PPO | Admitting: Psychology

## 2018-10-29 DIAGNOSIS — F418 Other specified anxiety disorders: Secondary | ICD-10-CM

## 2018-11-01 DIAGNOSIS — J3089 Other allergic rhinitis: Secondary | ICD-10-CM | POA: Diagnosis not present

## 2018-11-06 ENCOUNTER — Encounter: Payer: Self-pay | Admitting: Neurology

## 2018-11-06 ENCOUNTER — Ambulatory Visit (INDEPENDENT_AMBULATORY_CARE_PROVIDER_SITE_OTHER): Payer: PPO | Admitting: Neurology

## 2018-11-06 VITALS — BP 139/63 | HR 56 | Ht 64.0 in | Wt 125.0 lb

## 2018-11-06 DIAGNOSIS — R269 Unspecified abnormalities of gait and mobility: Secondary | ICD-10-CM | POA: Diagnosis not present

## 2018-11-06 DIAGNOSIS — G610 Guillain-Barre syndrome: Secondary | ICD-10-CM | POA: Diagnosis not present

## 2018-11-06 DIAGNOSIS — J3089 Other allergic rhinitis: Secondary | ICD-10-CM | POA: Diagnosis not present

## 2018-11-06 NOTE — Progress Notes (Signed)
Reason for visit: History of Guillain-Barre syndrome  Brianna Gates is an 68 y.o. female  History of present illness:  Brianna Gates is a 68 year old right-handed white female with a history of Guillain-Barre syndrome.  The patient has some residual weakness of the legs, she has some weakness of the left gastrocnemius muscle.  She is still working on a regular basis.  She feels like the amount of weight she can lift with the arms has dropped over the last year or so.  The patient has some arthritis affecting the hands.  She will be going for a sleep study as she has been having problems with snoring.  She does have some mild gait instability, she has not had any recent falls.  She feels somewhat sleepy on gabapentin 200 mg dosing during the day, she cut back to 100 mg in the morning and 100 mg at midday.  The patient takes 400 mg gabapentin at night.  She returns to this office for an evaluation.  She reports that she has significant arthritis affecting the right knee which also impacts her ability to walk.  Past Medical History:  Diagnosis Date  . Allergy   . Anxiety   . Asthma    INFREQUENT PROBLEM - rarely used inhaler  . Cervical spondylosis   . Cervical strain 07/22/2014  . Complication of anesthesia    UNKNOWN REACTION WITH RASH AT SITE OF IV only with knee surgery  . Degenerative arthritis   . Dyslipidemia   . Eczema   . GERD (gastroesophageal reflux disease)   . Guillain-Barre syndrome ([place])   . History of Guillain-Barre syndrome 07/22/2014  . Hyperlipidemia   . Hypertension   . Hypothyroidism   . Lumbosacral spondylosis   . Raynaud disease   . Thyroid nodule     Past Surgical History:  Procedure Laterality Date  . ABDOMINAL HYSTERECTOMY    . COLONOSCOPY    . FOOT SURGERY Left   . GANGLION CYST EXCISION Left    Wrist  . HERNIA REPAIR    . KNEE ARTHROSCOPY     RT KNEE  . KNEE ARTHROTOMY Right 08/27/2013   Procedure: RIGHT KNEE SCAR EXCISION AND FEMORAL  REVISION;  Surgeon: Gearlean Alf, MD;  Location: [facility];  Service: Orthopedics;  Laterality: Right;  . REPLACEMENT TOTAL KNEE BILATERAL Bilateral   . STRABISMUS SURGERY Right   . TONSILLECTOMY    . vaginal vulvo prolapse  09/2015    Family History  Problem Relation Age of Onset  . Hypertension Mother   . Other Mother        Dyslipidemia  . Heart disease Father   . Other Brother        Dyslipidemia  . Stomach cancer Paternal Uncle   . Colon cancer Neg Hx   . Rectal cancer Neg Hx   . Esophageal cancer Neg Hx     Social history:  reports that she has never smoked. She has never used smokeless tobacco. She reports current alcohol use of about 6.0 standard drinks of alcohol per week. She reports that she does not use drugs.    Allergies  Allergen Reactions  . Altace [Ramipril] Cough  . Buspar [Buspirone] Itching    Vivid dreams, diarrhea   . Captopril   . Daypro [Oxaprozin] Hives  . Zoloft [Sertraline Hcl] Diarrhea  . Ace Inhibitors     Other reaction(s): Other (See Comments) GB syndrome  . Doxycycline     Burning sensation/Rash  .  Influenza Vaccines     HX   . Sulfa Antibiotics Other (See Comments)    unknown    Medications:  Prior to Admission medications   Medication Sig Start Date End Date Taking? Authorizing Provider  albuterol (PROVENTIL HFA;VENTOLIN HFA) 108 (90 BASE) MCG/ACT inhaler Inhale 2 puffs into the lungs every 6 (six) hours as needed for wheezing or shortness of breath.   Yes [provider]  Ascorbic Acid (VITAMIN C PO) Take by mouth.   Yes [provider]  Carboxymethylcellulose Sodium (EYE DROPS OP) Apply 1 drop to eye 2 (two) times daily. RETAIN EYE DROPS- GETS FROM OPTOMETRIST   Yes [provider]  Cholecalciferol (VITAMIN D) 2000 UNITS CAPS Take 1 capsule by mouth daily.   Yes [provider]  gabapentin (NEURONTIN) 100 MG capsule TAKE 2 CAPSULES IN THE MORNING AND AT MID-DAY AND TAKE FOUR CAPSULES AT NIGHT  10/17/17  Yes Ward Givens, NP  ibuprofen (ADVIL,MOTRIN) 100 MG tablet Take 100 mg by mouth 2 (two) times daily as needed for fever.   Yes [provider]  levothyroxine (SYNTHROID) 50 MCG tablet Take 50 mcg by mouth daily before breakfast.    Yes [provider]  losartan (COZAAR) 50 MG tablet Take 50 mg by mouth daily.    Yes [provider]  Magnesium 250 MG TABS Take 250 mg by mouth daily.   Yes [provider]  Multiple Vitamins-Minerals (EYE VITAMINS PO) Take 4 tablets by mouth daily. HYDRO EYES   Yes [provider]  rosuvastatin (CRESTOR) 5 MG tablet Take 5 mg by mouth daily.   Yes [provider]    ROS:  Out of a complete 14 system review of symptoms, the patient complains only of the following symptoms, and all other reviewed systems are negative.  Fatigue Eye itching Cold intolerance Environmental allergies Joint pain, back pain, aching muscles, neck pain, neck stiffness Weakness Anxiety  Blood pressure 139/63, pulse (!) 56, height 5\' 4"  (1.626 m), weight 125 lb (56.7 kg).  Physical Exam  General: The patient is alert and cooperative at the time of the examination.  Skin: No significant peripheral edema is noted.   Neurologic Exam  Mental status: The patient is alert and oriented x 3 at the time of the examination. The patient has apparent normal recent and remote memory, with an apparently normal attention span and concentration ability.   Cranial nerves: Facial symmetry is present. Speech is normal, no aphasia or dysarthria is noted. Extraocular movements are full. Visual fields are full.  Motor: The patient has good strength in all 4 extremities, with exception that there may be some slight weakness of the intrinsic muscles of the hands bilaterally.  Sensory examination: Soft touch sensation is symmetric on the face, arms, and legs.  Coordination: The patient has good finger-nose-finger and heel-to-shin  bilaterally.  Gait and station: The patient has a normal gait. Tandem gait is minimally unsteady. Romberg is negative. No drift is seen.  The patient is able to walk on the heels bilaterally, she has some difficulty walking on the toes with the left leg as compared to the right.  Reflexes: Deep tendon reflexes are symmetric.   Assessment/Plan:  1.  History of Guillain-Barre syndrome  The patient will continue the gabapentin.  We will continue to follow things over time, she is having some neuropathic discomfort, she will need to reduce her dose of gabapentin to 100 mg twice during the daytime, 200 mg dosing increases drowsiness.  She will follow-up in 1 year.  Jill Alexanders MD 11/06/2018 1:02 PM  [facility] [address] [place], [place] [address]  Phone [phone] Fax [phone]

## 2018-11-12 ENCOUNTER — Ambulatory Visit (INDEPENDENT_AMBULATORY_CARE_PROVIDER_SITE_OTHER): Payer: PPO | Admitting: Psychology

## 2018-11-12 DIAGNOSIS — I7300 Raynaud's syndrome without gangrene: Secondary | ICD-10-CM | POA: Diagnosis not present

## 2018-11-12 DIAGNOSIS — G629 Polyneuropathy, unspecified: Secondary | ICD-10-CM | POA: Diagnosis not present

## 2018-11-12 DIAGNOSIS — I10 Essential (primary) hypertension: Secondary | ICD-10-CM | POA: Diagnosis not present

## 2018-11-12 DIAGNOSIS — G650 Sequelae of Guillain-Barre syndrome: Secondary | ICD-10-CM | POA: Diagnosis not present

## 2018-11-12 DIAGNOSIS — G610 Guillain-Barre syndrome: Secondary | ICD-10-CM | POA: Diagnosis not present

## 2018-11-12 DIAGNOSIS — M3500 Sicca syndrome, unspecified: Secondary | ICD-10-CM | POA: Diagnosis not present

## 2018-11-12 DIAGNOSIS — R7303 Prediabetes: Secondary | ICD-10-CM | POA: Diagnosis not present

## 2018-11-12 DIAGNOSIS — E7800 Pure hypercholesterolemia, unspecified: Secondary | ICD-10-CM | POA: Diagnosis not present

## 2018-11-12 DIAGNOSIS — F418 Other specified anxiety disorders: Secondary | ICD-10-CM

## 2018-11-12 DIAGNOSIS — K229 Disease of esophagus, unspecified: Secondary | ICD-10-CM | POA: Diagnosis not present

## 2018-11-12 DIAGNOSIS — E039 Hypothyroidism, unspecified: Secondary | ICD-10-CM | POA: Diagnosis not present

## 2018-11-14 DIAGNOSIS — J3089 Other allergic rhinitis: Secondary | ICD-10-CM | POA: Diagnosis not present

## 2018-11-18 ENCOUNTER — Other Ambulatory Visit: Payer: Self-pay | Admitting: Adult Health

## 2018-11-18 ENCOUNTER — Telehealth: Payer: Self-pay | Admitting: Neurology

## 2018-11-18 MED ORDER — GABAPENTIN 100 MG PO CAPS: ORAL_CAPSULE | ORAL | 11 refills | Status: DC

## 2018-11-18 NOTE — Telephone Encounter (Signed)
Pt has picked up gabapentin. Pt said she was taking 1 tab 100mg  twice/day, then 300 or 400mg  at bedtime. She is wanting to continue taking it this way. Script sent in is for 200mg  at night. Please call to advise

## 2018-11-19 ENCOUNTER — Encounter: Payer: Self-pay | Admitting: Neurology

## 2018-11-19 ENCOUNTER — Ambulatory Visit (INDEPENDENT_AMBULATORY_CARE_PROVIDER_SITE_OTHER): Payer: PPO | Admitting: Neurology

## 2018-11-19 VITALS — BP 130/72 | HR 56 | Ht 64.0 in | Wt 126.0 lb

## 2018-11-19 DIAGNOSIS — R0683 Snoring: Secondary | ICD-10-CM | POA: Diagnosis not present

## 2018-11-19 DIAGNOSIS — G4714 Hypersomnia due to medical condition: Secondary | ICD-10-CM | POA: Diagnosis not present

## 2018-11-19 DIAGNOSIS — J3089 Other allergic rhinitis: Secondary | ICD-10-CM

## 2018-11-19 NOTE — Telephone Encounter (Signed)
I spoke to pt and relayed that I had misread ofv note and yes she is to take 300-400mg  po qhs.  I redid prescription yesterday / cancelled previous prescription.  I relayed this to pt.  She verbalized understanding.  She did not pick up the other prescription.

## 2018-11-19 NOTE — Progress Notes (Signed)
SLEEP MEDICINE CLINIC   Provider:  Larey Seat, M D  Primary Care Physician:  Wenda Low, MD   Referring Provider: Lenor Coffin, MD    Chief Complaint  Patient presents with  . New Patient (Initial Visit)    Np for sleep apnea. Alone. Rm 10. Patient mentioned that she has always been a light sleeper, she also stated that her husband says that she snores. She stated that her husband and daughter both use a cpap machine.    HPI:  Brianna Gates is a 68 y.o. female patient, seen here as in a referral from [place] Dr/ [facility].   Chief complaint according to patient : Brianna Gates reports that her husband has been concerned about her snoring as he claimed to have witnessed.  He did not report that she stops breathing at times.  He is a CPAP user and felt that his wife snoring still affected his quality of sleep to some degree.  Brianna Gates has an interesting neurologic history of Guillain Barr syndrome, she has been followed by Dr. Lenor Coffin, she endorsed feeling easily cold, having joint pain swelling and aching muscles at one time she had trouble swallowing which has now improved, she does have allergies skin sensitivity insomnia, daytime sleepiness and some weakness.  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  Possibly anxiety.  She has a normal body mass index and her vital signs here are normal range except for a borderline slow heart rate.she had difficult intubations , needing flexible pediatric intubation equipment.   She is undergone knee replacement, bladder and pelvic tack in 2017.  I also reviewed her list of medications.  Sleep habits are as follows:  Dinner between 7 and 8 PM, with wine. No physical exercise usually not in the evening hours, but she does regularly visit an exercise room at gym, bedtime is around 930  to 10 PM with exceptions of course.  She usually is asleep in her bed by 45 minutes, she usually falls asleep on her right side.  She is only using one pillow for head support which she describes as rather soft.  She has one nocturia break each night, but it is difficult after that again to go back to sleep.  Now that her husband is retired the rise time in the morning is around 7:10 AM, her husband will be exercising.    Sleep relevant medical history: history of Guillain Barr syndrome, she has been followed by Dr. Lenor Coffin, she endorsed feeling easily cold, having joint pain swelling and aching muscles at one time she had trouble swallowing which has now improved, she does have allergies skin sensitivity insomnia, daytime sleepiness and some weakness.  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  Possibly anxiety.  She has a normal body mass index and her vital signs here are normal range except for a borderline slow heart rate.she had difficult intubations , needing flexible pediatric intubation equipment.     and family sleep history: 2 brothers that sleep well. youngst brother and patient had a child hood history of sleep walking, night terrors.  Mother, 90 , reports insomnia.    Social history:  Married, one daughter "Brook",  Non smoker, wine with dinner-  Caffeine- 2 cups of coffee,  no sodas, iced tea sometimes at lunch.   Review of Systems: Out of a complete 14 system review, the patient complains of only the following symptoms, and all other reviewed systems are negative. she endorsed the Epworth Sleepiness Scale at 8-10 points, the FSS at 28/ 63 - ever since GBS.   " I sleep in cars"       Social History   Socioeconomic History  . Marital status: Married    Spouse name: Not on file  . Number of children: 2  . Years of education:  college  . Highest education level: Not on file  Occupational History    Employer: UNEMPLOYED  Social Needs  . Financial resource strain: Not on file  . Food insecurity:    Worry: Not on file    Inability: Not on file  . Transportation needs:    Medical: Not on file    Non-medical: Not on file  Tobacco Use  . Smoking status: Never Smoker  . Smokeless tobacco: Never Used  Substance and Sexual Activity  . Alcohol use: Yes    Alcohol/week: 6.0 standard drinks    Types: 6 Glasses of wine per week    Comment: wine weekly  . Drug use: No  . Sexual activity: Yes    Birth control/protection: Other-see comments, Post-menopausal    Comment: Hysterectomy  Lifestyle  . Physical activity:    Days per week: Not on file    Minutes per session: Not on file  . Stress: Not on file  Relationships  . Social connections:    Talks on phone: Not on file    Gets together: Not on file    Attends religious service: Not on file    Active member of club or organization: Not on file    Attends meetings of clubs or organizations: Not on file    Relationship status: Not on file  . Intimate partner violence:    Fear of current or ex partner: Not on file    Emotionally abused: Not on file    Physically abused: Not on file    Forced sexual activity: Not on file  Other Topics Concern  . Not on file  Social History Narrative   Lives at home, married   Patient is right handed.   Patient drinks 2-3 cups caffeine daily.    Family History  Problem Relation Age of Onset  . Hypertension Mother   . Other Mother        Dyslipidemia  . Heart disease Father   . Other Brother        Dyslipidemia  . Stomach cancer Paternal Uncle   . Colon cancer Neg Hx   . Rectal cancer Neg Hx   . Esophageal cancer Neg Hx     Past Medical History:  Diagnosis Date  . Allergy   . Anxiety   . Asthma    INFREQUENT PROBLEM - rarely used inhaler  . Cervical spondylosis   . Cervical strain 07/22/2014  . Complication  of anesthesia    UNKNOWN REACTION WITH RASH AT SITE OF IV only with knee surgery  . Degenerative arthritis   . Dyslipidemia   . Eczema   . GERD (gastroesophageal reflux disease)   . Guillain-Barre syndrome ([place])   . History of Guillain-Barre syndrome 07/22/2014  . Hyperlipidemia   . Hypertension   . Hypothyroidism   . Lumbosacral spondylosis   . Raynaud disease   . Thyroid nodule     Past Surgical History:  Procedure Laterality Date  . ABDOMINAL HYSTERECTOMY    . COLONOSCOPY    . FOOT SURGERY Left   . GANGLION CYST EXCISION Left    Wrist  . HERNIA REPAIR    . KNEE ARTHROSCOPY     RT KNEE  . KNEE ARTHROTOMY Right 08/27/2013   Procedure: RIGHT KNEE SCAR EXCISION AND FEMORAL REVISION;  Surgeon: Gearlean Alf, MD;  Location: [facility];  Service: Orthopedics;  Laterality: Right;  . REPLACEMENT TOTAL KNEE BILATERAL Bilateral   . STRABISMUS SURGERY Right   . TONSILLECTOMY    . vaginal vulvo prolapse  09/2015    Current Outpatient Medications  Medication Sig Dispense Refill  . albuterol (PROVENTIL HFA;VENTOLIN HFA) 108 (90 BASE) MCG/ACT inhaler Inhale 2 puffs into the lungs every 6 (six) hours as needed for wheezing or shortness of breath.    . Ascorbic Acid (VITAMIN C PO) Take by mouth.    . Carboxymethylcellulose Sodium (EYE DROPS OP) Apply 1 drop to eye 2 (two) times daily. RETAIN EYE DROPS- GETS FROM OPTOMETRIST    . Cholecalciferol (VITAMIN D) 2000 UNITS CAPS Take 1 capsule by mouth daily.    Marland Kitchen gabapentin (NEURONTIN) 100 MG capsule Take 100mg  po twice daily, then 400mg  at bedtime. 180 capsule 11  . ibuprofen (ADVIL,MOTRIN) 100 MG tablet Take 100 mg by mouth 2 (two) times daily as needed for fever.    . levothyroxine (SYNTHROID) 50 MCG tablet Take 50 mcg by mouth daily before breakfast.     . losartan (COZAAR) 50 MG tablet Take 50 mg by mouth daily.     . Magnesium 250 MG TABS Take 250 mg by mouth daily.    . Multiple Vitamins-Minerals (EYE VITAMINS PO) Take 4 tablets by mouth  daily. HYDRO EYES    . rosuvastatin (CRESTOR) 5 MG tablet Take 5 mg by mouth daily.     No current facility-administered medications for this visit.     Allergies as of 11/19/2018 - Review Complete 11/19/2018  Allergen Reaction Noted  . Altace [ramipril] Cough 08/22/2012  . Buspar [buspirone] Itching 11/25/2014  . Captopril  08/22/2012  . Daypro [oxaprozin] Hives 08/22/2012  . Zoloft [sertraline hcl] Diarrhea 05/07/2012  . Ace inhibitors  07/28/2015  . Doxycycline  11/06/2018  . Influenza vaccines  08/18/2013  . Sulfa antibiotics Other (See Comments) 06/11/2014    Vitals: BP 130/72   Pulse (!) 56   Ht 5\' 4"  (1.626 m)   Wt 126 lb (57.2 kg)   BMI 21.63 kg/m  Last Weight:  Wt Readings from Last 1 Encounters:  11/19/18 126 lb (57.2 kg)   URK:YHCW mass index is 21.63 kg/m.     Last Height:   Ht Readings from Last 1 Encounters:  11/19/18 5\' 4"  (1.626 m)    Physical exam:  General: The patient is awake, alert and appears not in acute distress. The patient is well groomed. Head: Normocephalic, atraumatic. Neck is supple. Mallampati 5, crowded lower jaw , can't open fully.  neck circumference: 14.5 . Nasal airflow patent  Cardiovascular:  Regular rate and rhythm, without  murmurs or carotid bruit, and without distended neck veins. Respiratory: Lungs are clear to auscultation. Skin:  Without evidence of edema, or rash Trunk: BMI is 21  The patient's posture is erect   Neurologic exam :  Attention span & concentration ability appears normal.  Speech is fluent,  without  dysarthria, dysphonia or aphasia.  Mood and affect are appropriate.  Cranial nerves: Pupils are equal and briskly  reactive to light. Funduscopic exam without evidence of pallor or edema.  Extraocular movements  in vertical and horizontal planes intact and without nystagmus. Visual fields by finger perimetry are intact. Hearing to finger rub intact.   Facial sensation intact to fine touch.  Facial motor  strength is symmetric and tongue and uvula move midline. Shoulder shrug was symmetrical.   Motor exam:  Normal tone, muscle bulk and symmetric strength in all extremities.  Sensory: GBS neuropathy  Coordination: Rapid alternating movements in the fingers/hands was normal. Finger-to-nose maneuver normal without evidence of ataxia, dysmetria or tremor. Gait and station: Patient walks without assistive device. GBS neuropathy  Deep tendon reflexes: in the  upper and lower extremities are symmetric and intact. Babinski maneuver response is downgoing.    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      The patient was advised of the nature of the diagnosed disorder , the treatment options and the  risks for general health and wellness arising from not treating the condition.   I spent more than 35 minutes of face to face time with the patient.  Greater than 50% of time was spent in counseling and coordination of care. We have discussed the diagnosis and differential and I answered the patient's questions.    Plan:  Treatment plan and additional workup : Attended Sleep study for evaluation of fragmented sleep , insomnia component and limb movements.       Larey Seat, MD 12/22/7562, 33:29 AM  Certified in Neurology by ABPN Certified in [place] by [facility] [address], [place], [place] [address]

## 2018-11-20 ENCOUNTER — Telehealth: Payer: Self-pay

## 2018-11-20 ENCOUNTER — Other Ambulatory Visit: Payer: Self-pay | Admitting: Neurology

## 2018-11-20 DIAGNOSIS — G4714 Hypersomnia due to medical condition: Secondary | ICD-10-CM

## 2018-11-20 DIAGNOSIS — D225 Melanocytic nevi of trunk: Secondary | ICD-10-CM | POA: Diagnosis not present

## 2018-11-20 DIAGNOSIS — L918 Other hypertrophic disorders of the skin: Secondary | ICD-10-CM | POA: Diagnosis not present

## 2018-11-20 DIAGNOSIS — L821 Other seborrheic keratosis: Secondary | ICD-10-CM | POA: Diagnosis not present

## 2018-11-20 DIAGNOSIS — J3089 Other allergic rhinitis: Secondary | ICD-10-CM

## 2018-11-20 DIAGNOSIS — R0683 Snoring: Secondary | ICD-10-CM

## 2018-11-20 DIAGNOSIS — L570 Actinic keratosis: Secondary | ICD-10-CM | POA: Diagnosis not present

## 2018-11-20 NOTE — Telephone Encounter (Signed)
[facility] will deny the in lab sleep study request. Need a HST order.

## 2018-11-20 NOTE — Telephone Encounter (Signed)
Order placed

## 2018-11-21 DIAGNOSIS — J3089 Other allergic rhinitis: Secondary | ICD-10-CM | POA: Diagnosis not present

## 2018-11-27 ENCOUNTER — Ambulatory Visit (INDEPENDENT_AMBULATORY_CARE_PROVIDER_SITE_OTHER): Payer: PPO | Admitting: Neurology

## 2018-11-27 DIAGNOSIS — G4714 Hypersomnia due to medical condition: Secondary | ICD-10-CM

## 2018-11-27 DIAGNOSIS — G4710 Hypersomnia, unspecified: Secondary | ICD-10-CM | POA: Diagnosis not present

## 2018-11-27 DIAGNOSIS — R0683 Snoring: Secondary | ICD-10-CM

## 2018-11-27 DIAGNOSIS — J3089 Other allergic rhinitis: Secondary | ICD-10-CM

## 2018-11-28 DIAGNOSIS — J3089 Other allergic rhinitis: Secondary | ICD-10-CM | POA: Diagnosis not present

## 2018-11-29 NOTE — Procedures (Signed)
NAME:  Brianna Gates                                                                       DOB: 01-17-1951 MEDICAL RECORD No:  [id number]                                                       DOS: 11/27/2018   REFERRING PHYSICIAN: Jill Alexanders, MD STUDY PERFORMED: Home Sleep Test on Watch [facility] HISTORY: [name] is a 68 y.o. female patient, seen here in a referral from [place] Dr. Jannifer Franklin.  [name] reports that her husband has been concerned about her snoring as he claimed to have witnessed.  He did not report that she stops breathing at times.  He is a CPAP user and felt that his wife snoring still affected his quality of sleep to some degree.  [name] has a neurologic history of Guillain-Barr syndrome, she has been followed by Dr. Lenor Coffin. She endorsed cold sensitivity, having joint pain, joint swelling and aching muscles. She had trouble swallowing which has now improved, she has insomnia, daytime sleepiness and carries a diagnosis of esophagitis, Barrett's esophagus, thyroid nodule, and hypercholesterolemia, and hypertension. Remarkably small upper airway and TMJ restriction.   Epworth Sleepiness Scale at 8-10 points, the FSS at 28/ 63 - BMI: 21.5 kg/m2  STUDY RESULTS:  Total Recording Time: 9 h 11 mins; Total Sleep Time:  8 h 28 mins Total Apnea/Hypopnea Index (AHI): 5.1 /h; REM AHI: 10.3 /h Average Oxygen Saturation:    95%; Lowest Oxygen Desaturation:  90 %.  Total Time Oxygen Saturation Below or at 88 %:  0.0 minutes.  Average Heart Rate:  58 bpm (between 47 and 81 bpm).  IMPRESSION:  This is very mild OSA (Obstructive Sleep Apnea) at AHI 5.1/h.  Moderate snoring is likely present.   There is a clear evidence of REM sleep exacerbation, but cardiac stress responses or prolonged hypoxia were not noted.   RECOMMENDATION:  CPAP and Dental Device Therapy are possible treatment options, addressing snoring and mild OSA.  The apnea is mild enough to not  account for daytime sleepiness and fatigue.  I will leave it up to the patient to choose between these options.   I certify that I have reviewed the raw data recording prior to the issuance of this report in accordance with the standards of the American Academy of Sleep Medicine (AASM). Larey Seat, M.D.  11-29-2018      Medical Director of [facility] at [facility], accredited by the AASM. Diplomat of the ABPN and ABSM.

## 2018-12-04 ENCOUNTER — Telehealth: Payer: Self-pay | Admitting: Neurology

## 2018-12-04 DIAGNOSIS — R0683 Snoring: Secondary | ICD-10-CM

## 2018-12-04 DIAGNOSIS — G4714 Hypersomnia due to medical condition: Secondary | ICD-10-CM

## 2018-12-04 DIAGNOSIS — G4730 Sleep apnea, unspecified: Secondary | ICD-10-CM

## 2018-12-04 NOTE — Telephone Encounter (Signed)
Called patient to discuss sleep study results. No answer at this time. LVM for the patient to call back.   

## 2018-12-04 NOTE — Telephone Encounter (Signed)
-----   Message from Larey Seat, MD sent at 11/29/2018  2:31 PM EST ----- IMPRESSION: This is very mild OSA (Obstructive Sleep Apnea) at  AHI 5.1/h.  Moderate snoring is likely present.  There is a clear evidence of REM sleep exacerbation, but cardiac  stress responses or prolonged hypoxia were not noted.  RECOMMENDATION: CPAP and Dental Device Therapy are possible  treatment options, addressing snoring and mild OSA.  The apnea is mild enough to not account for daytime sleepiness  and fatigue.  I will leave it up to the patient to choose between these  options.

## 2018-12-05 NOTE — Addendum Note (Signed)
Addended by: Darleen Crocker on: 12/05/2018 01:27 PM   Modules accepted: Orders

## 2018-12-05 NOTE — Telephone Encounter (Signed)
Called the patient and reviewed the sleep study with her. Patient would like to attempt to the dental device. Advised the pt the options and what each would mean. She would like to do the dental device. I will place the order for a referral dentist for the dental device.

## 2018-12-09 DIAGNOSIS — H2513 Age-related nuclear cataract, bilateral: Secondary | ICD-10-CM | POA: Diagnosis not present

## 2018-12-09 DIAGNOSIS — H16223 Keratoconjunctivitis sicca, not specified as Sjogren's, bilateral: Secondary | ICD-10-CM | POA: Diagnosis not present

## 2018-12-10 ENCOUNTER — Ambulatory Visit (INDEPENDENT_AMBULATORY_CARE_PROVIDER_SITE_OTHER): Payer: PPO | Admitting: Psychology

## 2018-12-10 DIAGNOSIS — F418 Other specified anxiety disorders: Secondary | ICD-10-CM

## 2018-12-10 DIAGNOSIS — J3089 Other allergic rhinitis: Secondary | ICD-10-CM | POA: Diagnosis not present

## 2018-12-18 DIAGNOSIS — J3089 Other allergic rhinitis: Secondary | ICD-10-CM | POA: Diagnosis not present

## 2018-12-25 DIAGNOSIS — Z471 Aftercare following joint replacement surgery: Secondary | ICD-10-CM | POA: Diagnosis not present

## 2018-12-25 DIAGNOSIS — Z96653 Presence of artificial knee joint, bilateral: Secondary | ICD-10-CM | POA: Diagnosis not present

## 2018-12-25 DIAGNOSIS — Z96652 Presence of left artificial knee joint: Secondary | ICD-10-CM | POA: Diagnosis not present

## 2018-12-25 DIAGNOSIS — Z96651 Presence of right artificial knee joint: Secondary | ICD-10-CM | POA: Diagnosis not present

## 2018-12-26 ENCOUNTER — Other Ambulatory Visit (HOSPITAL_COMMUNITY): Payer: Self-pay | Admitting: Physician Assistant

## 2018-12-26 DIAGNOSIS — J3089 Other allergic rhinitis: Secondary | ICD-10-CM | POA: Diagnosis not present

## 2018-12-26 DIAGNOSIS — Z96653 Presence of artificial knee joint, bilateral: Secondary | ICD-10-CM

## 2018-12-31 ENCOUNTER — Ambulatory Visit (INDEPENDENT_AMBULATORY_CARE_PROVIDER_SITE_OTHER): Payer: PPO | Admitting: Psychology

## 2018-12-31 DIAGNOSIS — F418 Other specified anxiety disorders: Secondary | ICD-10-CM

## 2019-01-02 DIAGNOSIS — J3089 Other allergic rhinitis: Secondary | ICD-10-CM | POA: Diagnosis not present

## 2019-01-03 ENCOUNTER — Encounter (HOSPITAL_COMMUNITY): Payer: PPO

## 2019-01-08 DIAGNOSIS — J3089 Other allergic rhinitis: Secondary | ICD-10-CM | POA: Diagnosis not present

## 2019-01-13 ENCOUNTER — Ambulatory Visit (INDEPENDENT_AMBULATORY_CARE_PROVIDER_SITE_OTHER): Payer: PPO | Admitting: Psychology

## 2019-01-13 DIAGNOSIS — F418 Other specified anxiety disorders: Secondary | ICD-10-CM

## 2019-01-15 DIAGNOSIS — J3089 Other allergic rhinitis: Secondary | ICD-10-CM | POA: Diagnosis not present

## 2019-01-22 DIAGNOSIS — J3089 Other allergic rhinitis: Secondary | ICD-10-CM | POA: Diagnosis not present

## 2019-01-28 ENCOUNTER — Ambulatory Visit (INDEPENDENT_AMBULATORY_CARE_PROVIDER_SITE_OTHER): Payer: PPO | Admitting: Psychology

## 2019-01-28 DIAGNOSIS — L409 Psoriasis, unspecified: Secondary | ICD-10-CM | POA: Diagnosis not present

## 2019-01-28 DIAGNOSIS — I7300 Raynaud's syndrome without gangrene: Secondary | ICD-10-CM | POA: Diagnosis not present

## 2019-01-28 DIAGNOSIS — M1990 Unspecified osteoarthritis, unspecified site: Secondary | ICD-10-CM | POA: Diagnosis not present

## 2019-01-28 DIAGNOSIS — E785 Hyperlipidemia, unspecified: Secondary | ICD-10-CM | POA: Diagnosis not present

## 2019-01-28 DIAGNOSIS — J309 Allergic rhinitis, unspecified: Secondary | ICD-10-CM | POA: Diagnosis not present

## 2019-01-28 DIAGNOSIS — F419 Anxiety disorder, unspecified: Secondary | ICD-10-CM | POA: Diagnosis not present

## 2019-01-28 DIAGNOSIS — I10 Essential (primary) hypertension: Secondary | ICD-10-CM | POA: Diagnosis not present

## 2019-01-28 DIAGNOSIS — F418 Other specified anxiety disorders: Secondary | ICD-10-CM

## 2019-01-28 DIAGNOSIS — Z1331 Encounter for screening for depression: Secondary | ICD-10-CM | POA: Diagnosis not present

## 2019-01-28 DIAGNOSIS — E039 Hypothyroidism, unspecified: Secondary | ICD-10-CM | POA: Diagnosis not present

## 2019-01-28 DIAGNOSIS — G629 Polyneuropathy, unspecified: Secondary | ICD-10-CM | POA: Diagnosis not present

## 2019-01-29 DIAGNOSIS — J3089 Other allergic rhinitis: Secondary | ICD-10-CM | POA: Diagnosis not present

## 2019-01-31 ENCOUNTER — Ambulatory Visit (HOSPITAL_COMMUNITY)
Admission: RE | Admit: 2019-01-31 | Discharge: 2019-01-31 | Disposition: A | Discharge status: Home / Self Care | Payer: PPO | Source: Ambulatory Visit | Attending: Physician Assistant | Admitting: Physician Assistant

## 2019-01-31 ENCOUNTER — Encounter (HOSPITAL_COMMUNITY)
Admission: RE | Admit: 2019-01-31 | Discharge: 2019-01-31 | Disposition: A | Discharge status: Home / Self Care | Payer: PPO | Source: Ambulatory Visit | Attending: Physician Assistant | Admitting: Physician Assistant

## 2019-01-31 ENCOUNTER — Other Ambulatory Visit: Payer: Self-pay

## 2019-01-31 DIAGNOSIS — Z96653 Presence of artificial knee joint, bilateral: Secondary | ICD-10-CM | POA: Insufficient documentation

## 2019-01-31 DIAGNOSIS — T84092A Other mechanical complication of internal right knee prosthesis, initial encounter: Secondary | ICD-10-CM | POA: Diagnosis not present

## 2019-01-31 MED ORDER — TECHNETIUM TC 99M MEDRONATE IV KIT
20.0000 | PACK | Freq: Once | INTRAVENOUS | Status: AC | PRN
  Administered 2019-01-31: 11:00:00 20 via INTRAVENOUS

## 2019-02-06 DIAGNOSIS — Z471 Aftercare following joint replacement surgery: Secondary | ICD-10-CM | POA: Diagnosis not present

## 2019-02-06 DIAGNOSIS — M24661 Ankylosis, right knee: Secondary | ICD-10-CM | POA: Diagnosis not present

## 2019-02-06 DIAGNOSIS — Z96653 Presence of artificial knee joint, bilateral: Secondary | ICD-10-CM | POA: Diagnosis not present

## 2019-02-06 DIAGNOSIS — J3089 Other allergic rhinitis: Secondary | ICD-10-CM | POA: Diagnosis not present

## 2019-02-06 DIAGNOSIS — M25561 Pain in right knee: Secondary | ICD-10-CM | POA: Diagnosis not present

## 2019-02-12 DIAGNOSIS — I7300 Raynaud's syndrome without gangrene: Secondary | ICD-10-CM | POA: Diagnosis not present

## 2019-02-12 DIAGNOSIS — M3500 Sicca syndrome, unspecified: Secondary | ICD-10-CM | POA: Diagnosis not present

## 2019-02-12 DIAGNOSIS — J3089 Other allergic rhinitis: Secondary | ICD-10-CM | POA: Diagnosis not present

## 2019-02-12 DIAGNOSIS — M150 Primary generalized (osteo)arthritis: Secondary | ICD-10-CM | POA: Diagnosis not present

## 2019-02-17 ENCOUNTER — Ambulatory Visit (INDEPENDENT_AMBULATORY_CARE_PROVIDER_SITE_OTHER): Payer: PPO | Admitting: Psychology

## 2019-02-17 DIAGNOSIS — F418 Other specified anxiety disorders: Secondary | ICD-10-CM

## 2019-02-18 DIAGNOSIS — M25661 Stiffness of right knee, not elsewhere classified: Secondary | ICD-10-CM | POA: Diagnosis not present

## 2019-02-20 DIAGNOSIS — J3089 Other allergic rhinitis: Secondary | ICD-10-CM | POA: Diagnosis not present

## 2019-02-21 DIAGNOSIS — M25661 Stiffness of right knee, not elsewhere classified: Secondary | ICD-10-CM | POA: Diagnosis not present

## 2019-02-24 DIAGNOSIS — J3089 Other allergic rhinitis: Secondary | ICD-10-CM | POA: Diagnosis not present

## 2019-02-25 DIAGNOSIS — M25661 Stiffness of right knee, not elsewhere classified: Secondary | ICD-10-CM | POA: Diagnosis not present

## 2019-02-26 DIAGNOSIS — J3089 Other allergic rhinitis: Secondary | ICD-10-CM | POA: Diagnosis not present

## 2019-02-27 DIAGNOSIS — M25661 Stiffness of right knee, not elsewhere classified: Secondary | ICD-10-CM | POA: Diagnosis not present

## 2019-03-04 DIAGNOSIS — M25661 Stiffness of right knee, not elsewhere classified: Secondary | ICD-10-CM | POA: Diagnosis not present

## 2019-03-05 DIAGNOSIS — J3089 Other allergic rhinitis: Secondary | ICD-10-CM | POA: Diagnosis not present

## 2019-03-06 DIAGNOSIS — M25661 Stiffness of right knee, not elsewhere classified: Secondary | ICD-10-CM | POA: Diagnosis not present

## 2019-03-11 DIAGNOSIS — M25661 Stiffness of right knee, not elsewhere classified: Secondary | ICD-10-CM | POA: Diagnosis not present

## 2019-03-12 DIAGNOSIS — J3089 Other allergic rhinitis: Secondary | ICD-10-CM | POA: Diagnosis not present

## 2019-03-13 DIAGNOSIS — M25661 Stiffness of right knee, not elsewhere classified: Secondary | ICD-10-CM | POA: Diagnosis not present

## 2019-03-14 DIAGNOSIS — M25661 Stiffness of right knee, not elsewhere classified: Secondary | ICD-10-CM | POA: Diagnosis not present

## 2019-03-14 DIAGNOSIS — Z96653 Presence of artificial knee joint, bilateral: Secondary | ICD-10-CM | POA: Diagnosis not present

## 2019-03-14 DIAGNOSIS — Z471 Aftercare following joint replacement surgery: Secondary | ICD-10-CM | POA: Diagnosis not present

## 2019-03-18 DIAGNOSIS — J3089 Other allergic rhinitis: Secondary | ICD-10-CM | POA: Diagnosis not present

## 2019-03-19 DIAGNOSIS — M25661 Stiffness of right knee, not elsewhere classified: Secondary | ICD-10-CM | POA: Diagnosis not present

## 2019-03-20 DIAGNOSIS — Z6821 Body mass index (BMI) 21.0-21.9, adult: Secondary | ICD-10-CM | POA: Diagnosis not present

## 2019-03-20 DIAGNOSIS — N951 Menopausal and female climacteric states: Secondary | ICD-10-CM | POA: Diagnosis not present

## 2019-03-20 DIAGNOSIS — M25661 Stiffness of right knee, not elsewhere classified: Secondary | ICD-10-CM | POA: Diagnosis not present

## 2019-03-20 DIAGNOSIS — J3089 Other allergic rhinitis: Secondary | ICD-10-CM | POA: Diagnosis not present

## 2019-03-20 DIAGNOSIS — Z01419 Encounter for gynecological examination (general) (routine) without abnormal findings: Secondary | ICD-10-CM | POA: Diagnosis not present

## 2019-03-20 DIAGNOSIS — Z1231 Encounter for screening mammogram for malignant neoplasm of breast: Secondary | ICD-10-CM | POA: Diagnosis not present

## 2019-03-21 DIAGNOSIS — H01001 Unspecified blepharitis right upper eyelid: Secondary | ICD-10-CM | POA: Diagnosis not present

## 2019-03-21 DIAGNOSIS — H01004 Unspecified blepharitis left upper eyelid: Secondary | ICD-10-CM | POA: Diagnosis not present

## 2019-03-21 DIAGNOSIS — H16223 Keratoconjunctivitis sicca, not specified as Sjogren's, bilateral: Secondary | ICD-10-CM | POA: Diagnosis not present

## 2019-03-21 DIAGNOSIS — H01002 Unspecified blepharitis right lower eyelid: Secondary | ICD-10-CM | POA: Diagnosis not present

## 2019-03-24 DIAGNOSIS — M25661 Stiffness of right knee, not elsewhere classified: Secondary | ICD-10-CM | POA: Diagnosis not present

## 2019-03-25 DIAGNOSIS — J3089 Other allergic rhinitis: Secondary | ICD-10-CM | POA: Diagnosis not present

## 2019-03-26 DIAGNOSIS — M25661 Stiffness of right knee, not elsewhere classified: Secondary | ICD-10-CM | POA: Diagnosis not present

## 2019-04-07 DIAGNOSIS — M25661 Stiffness of right knee, not elsewhere classified: Secondary | ICD-10-CM | POA: Diagnosis not present

## 2019-04-08 ENCOUNTER — Ambulatory Visit (INDEPENDENT_AMBULATORY_CARE_PROVIDER_SITE_OTHER): Payer: PPO | Admitting: Psychology

## 2019-04-08 DIAGNOSIS — F418 Other specified anxiety disorders: Secondary | ICD-10-CM

## 2019-04-09 DIAGNOSIS — J3089 Other allergic rhinitis: Secondary | ICD-10-CM | POA: Diagnosis not present

## 2019-04-14 DIAGNOSIS — M25661 Stiffness of right knee, not elsewhere classified: Secondary | ICD-10-CM | POA: Diagnosis not present

## 2019-04-16 DIAGNOSIS — M25661 Stiffness of right knee, not elsewhere classified: Secondary | ICD-10-CM | POA: Diagnosis not present

## 2019-04-17 DIAGNOSIS — J3089 Other allergic rhinitis: Secondary | ICD-10-CM | POA: Diagnosis not present

## 2019-04-21 DIAGNOSIS — M25661 Stiffness of right knee, not elsewhere classified: Secondary | ICD-10-CM | POA: Diagnosis not present

## 2019-04-23 DIAGNOSIS — R062 Wheezing: Secondary | ICD-10-CM | POA: Diagnosis not present

## 2019-04-23 DIAGNOSIS — J3089 Other allergic rhinitis: Secondary | ICD-10-CM | POA: Diagnosis not present

## 2019-04-23 DIAGNOSIS — M25661 Stiffness of right knee, not elsewhere classified: Secondary | ICD-10-CM | POA: Diagnosis not present

## 2019-04-24 DIAGNOSIS — Z96653 Presence of artificial knee joint, bilateral: Secondary | ICD-10-CM | POA: Diagnosis not present

## 2019-04-24 DIAGNOSIS — M25661 Stiffness of right knee, not elsewhere classified: Secondary | ICD-10-CM | POA: Diagnosis not present

## 2019-05-02 DIAGNOSIS — E038 Other specified hypothyroidism: Secondary | ICD-10-CM | POA: Diagnosis not present

## 2019-05-02 DIAGNOSIS — E7849 Other hyperlipidemia: Secondary | ICD-10-CM | POA: Diagnosis not present

## 2019-05-02 DIAGNOSIS — I10 Essential (primary) hypertension: Secondary | ICD-10-CM | POA: Diagnosis not present

## 2019-05-05 DIAGNOSIS — R82998 Other abnormal findings in urine: Secondary | ICD-10-CM | POA: Diagnosis not present

## 2019-05-05 DIAGNOSIS — I10 Essential (primary) hypertension: Secondary | ICD-10-CM | POA: Diagnosis not present

## 2019-05-07 DIAGNOSIS — J3089 Other allergic rhinitis: Secondary | ICD-10-CM | POA: Diagnosis not present

## 2019-05-09 DIAGNOSIS — E039 Hypothyroidism, unspecified: Secondary | ICD-10-CM | POA: Diagnosis not present

## 2019-05-09 DIAGNOSIS — F419 Anxiety disorder, unspecified: Secondary | ICD-10-CM | POA: Diagnosis not present

## 2019-05-09 DIAGNOSIS — J309 Allergic rhinitis, unspecified: Secondary | ICD-10-CM | POA: Diagnosis not present

## 2019-05-09 DIAGNOSIS — M1990 Unspecified osteoarthritis, unspecified site: Secondary | ICD-10-CM | POA: Diagnosis not present

## 2019-05-09 DIAGNOSIS — G629 Polyneuropathy, unspecified: Secondary | ICD-10-CM | POA: Diagnosis not present

## 2019-05-09 DIAGNOSIS — I7300 Raynaud's syndrome without gangrene: Secondary | ICD-10-CM | POA: Diagnosis not present

## 2019-05-09 DIAGNOSIS — I10 Essential (primary) hypertension: Secondary | ICD-10-CM | POA: Diagnosis not present

## 2019-05-09 DIAGNOSIS — Z0000 Encounter for general adult medical examination without abnormal findings: Secondary | ICD-10-CM | POA: Diagnosis not present

## 2019-05-09 DIAGNOSIS — M3500 Sicca syndrome, unspecified: Secondary | ICD-10-CM | POA: Diagnosis not present

## 2019-05-09 DIAGNOSIS — E785 Hyperlipidemia, unspecified: Secondary | ICD-10-CM | POA: Diagnosis not present

## 2019-05-09 DIAGNOSIS — L409 Psoriasis, unspecified: Secondary | ICD-10-CM | POA: Diagnosis not present

## 2019-05-09 DIAGNOSIS — E041 Nontoxic single thyroid nodule: Secondary | ICD-10-CM | POA: Diagnosis not present

## 2019-05-21 DIAGNOSIS — J3089 Other allergic rhinitis: Secondary | ICD-10-CM | POA: Diagnosis not present

## 2019-06-05 DIAGNOSIS — J3089 Other allergic rhinitis: Secondary | ICD-10-CM | POA: Diagnosis not present

## 2019-06-06 DIAGNOSIS — I10 Essential (primary) hypertension: Secondary | ICD-10-CM | POA: Diagnosis not present

## 2019-06-06 DIAGNOSIS — I7300 Raynaud's syndrome without gangrene: Secondary | ICD-10-CM | POA: Diagnosis not present

## 2019-06-18 DIAGNOSIS — J3089 Other allergic rhinitis: Secondary | ICD-10-CM | POA: Diagnosis not present

## 2019-07-02 DIAGNOSIS — J3089 Other allergic rhinitis: Secondary | ICD-10-CM | POA: Diagnosis not present

## 2019-07-15 DIAGNOSIS — J3089 Other allergic rhinitis: Secondary | ICD-10-CM | POA: Diagnosis not present

## 2019-07-30 DIAGNOSIS — Z96651 Presence of right artificial knee joint: Secondary | ICD-10-CM | POA: Diagnosis not present

## 2019-07-30 DIAGNOSIS — J3089 Other allergic rhinitis: Secondary | ICD-10-CM | POA: Diagnosis not present

## 2019-07-30 DIAGNOSIS — M1710 Unilateral primary osteoarthritis, unspecified knee: Secondary | ICD-10-CM | POA: Insufficient documentation

## 2019-07-30 DIAGNOSIS — M1711 Unilateral primary osteoarthritis, right knee: Secondary | ICD-10-CM | POA: Diagnosis not present

## 2019-07-30 DIAGNOSIS — Z96653 Presence of artificial knee joint, bilateral: Secondary | ICD-10-CM | POA: Diagnosis not present

## 2019-08-07 DIAGNOSIS — E039 Hypothyroidism, unspecified: Secondary | ICD-10-CM | POA: Diagnosis not present

## 2019-08-13 DIAGNOSIS — J3089 Other allergic rhinitis: Secondary | ICD-10-CM | POA: Diagnosis not present

## 2019-08-14 DIAGNOSIS — E049 Nontoxic goiter, unspecified: Secondary | ICD-10-CM | POA: Diagnosis not present

## 2019-08-14 DIAGNOSIS — R7301 Impaired fasting glucose: Secondary | ICD-10-CM | POA: Diagnosis not present

## 2019-08-14 DIAGNOSIS — M2550 Pain in unspecified joint: Secondary | ICD-10-CM | POA: Diagnosis not present

## 2019-08-14 DIAGNOSIS — E039 Hypothyroidism, unspecified: Secondary | ICD-10-CM | POA: Diagnosis not present

## 2019-08-14 DIAGNOSIS — I10 Essential (primary) hypertension: Secondary | ICD-10-CM | POA: Diagnosis not present

## 2019-08-18 DIAGNOSIS — D225 Melanocytic nevi of trunk: Secondary | ICD-10-CM | POA: Diagnosis not present

## 2019-08-18 DIAGNOSIS — L245 Irritant contact dermatitis due to other chemical products: Secondary | ICD-10-CM | POA: Diagnosis not present

## 2019-08-18 DIAGNOSIS — L570 Actinic keratosis: Secondary | ICD-10-CM | POA: Diagnosis not present

## 2019-08-18 DIAGNOSIS — L821 Other seborrheic keratosis: Secondary | ICD-10-CM | POA: Diagnosis not present

## 2019-09-03 DIAGNOSIS — J3089 Other allergic rhinitis: Secondary | ICD-10-CM | POA: Diagnosis not present

## 2019-09-08 DIAGNOSIS — L282 Other prurigo: Secondary | ICD-10-CM | POA: Diagnosis not present

## 2019-09-08 DIAGNOSIS — L245 Irritant contact dermatitis due to other chemical products: Secondary | ICD-10-CM | POA: Diagnosis not present

## 2019-09-10 DIAGNOSIS — H16223 Keratoconjunctivitis sicca, not specified as Sjogren's, bilateral: Secondary | ICD-10-CM | POA: Diagnosis not present

## 2019-09-10 DIAGNOSIS — H43813 Vitreous degeneration, bilateral: Secondary | ICD-10-CM | POA: Diagnosis not present

## 2019-09-10 DIAGNOSIS — H2513 Age-related nuclear cataract, bilateral: Secondary | ICD-10-CM | POA: Diagnosis not present

## 2019-09-10 DIAGNOSIS — H0100A Unspecified blepharitis right eye, upper and lower eyelids: Secondary | ICD-10-CM | POA: Diagnosis not present

## 2019-09-17 DIAGNOSIS — J3089 Other allergic rhinitis: Secondary | ICD-10-CM | POA: Diagnosis not present

## 2019-10-01 DIAGNOSIS — J3089 Other allergic rhinitis: Secondary | ICD-10-CM | POA: Diagnosis not present

## 2019-10-15 DIAGNOSIS — J3089 Other allergic rhinitis: Secondary | ICD-10-CM | POA: Diagnosis not present

## 2019-10-16 DIAGNOSIS — J3089 Other allergic rhinitis: Secondary | ICD-10-CM | POA: Diagnosis not present

## 2019-10-23 ENCOUNTER — Telehealth: Payer: Self-pay

## 2019-10-23 NOTE — Telephone Encounter (Signed)
Patient called wanting to know if she should be getting the covid vaccine should it become available to her since she has not had her flu vaccine in awhile and due to her health conditions. Please follow up.

## 2019-10-23 NOTE — Telephone Encounter (Signed)
Called the patient. There was no answer. LVM informing her that Dr Brett Fairy states in her research and evidence that has been produced there has been nothing to suggest that the covid 19 vaccine is harmful for patient's with a h/o Gullian barre'.  There was no answer but I did leave a VM informing the patient of this information.  Advised her to call with any questions

## 2019-10-28 DIAGNOSIS — J3089 Other allergic rhinitis: Secondary | ICD-10-CM | POA: Diagnosis not present

## 2019-11-06 ENCOUNTER — Ambulatory Visit: Payer: PPO | Attending: Internal Medicine

## 2019-11-06 DIAGNOSIS — Z23 Encounter for immunization: Secondary | ICD-10-CM | POA: Insufficient documentation

## 2019-11-06 NOTE — Progress Notes (Signed)
   Covid-19 Vaccination Clinic  Name:  Brianna Gates    MRN: [id number] DOB: January 13, 1951  11/06/2019  Brianna Gates was observed post Covid-19 immunization for 15 minutes without incidence. She was provided with Vaccine Information Sheet and instruction to access the V-Safe system.   Brianna Gates was instructed to call 911 with any severe reactions post vaccine: Marland Kitchen Difficulty breathing  . Swelling of your face and throat  . A fast heartbeat  . A bad rash all over your body  . Dizziness and weakness    Immunizations Administered    Name Date Dose VIS Date Route   Pfizer COVID-19 Vaccine 11/06/2019  6:04 PM 0.3 mL 09/26/2019 Intramuscular   Manufacturer: [place]   Lot: [id number]   [place]: [id number]

## 2019-11-07 ENCOUNTER — Ambulatory Visit (INDEPENDENT_AMBULATORY_CARE_PROVIDER_SITE_OTHER): Payer: PPO

## 2019-11-07 ENCOUNTER — Ambulatory Visit: Payer: PPO | Admitting: Podiatry

## 2019-11-07 ENCOUNTER — Other Ambulatory Visit: Payer: Self-pay

## 2019-11-07 ENCOUNTER — Encounter: Payer: Self-pay | Admitting: Podiatry

## 2019-11-07 DIAGNOSIS — M79672 Pain in left foot: Secondary | ICD-10-CM | POA: Diagnosis not present

## 2019-11-07 DIAGNOSIS — M722 Plantar fascial fibromatosis: Secondary | ICD-10-CM | POA: Diagnosis not present

## 2019-11-07 DIAGNOSIS — M779 Enthesopathy, unspecified: Secondary | ICD-10-CM

## 2019-11-07 DIAGNOSIS — L600 Ingrowing nail: Secondary | ICD-10-CM | POA: Diagnosis not present

## 2019-11-07 DIAGNOSIS — M1990 Unspecified osteoarthritis, unspecified site: Secondary | ICD-10-CM

## 2019-11-07 MED ORDER — CEPHALEXIN 500 MG PO CAPS: 500.0000 mg | ORAL_CAPSULE | Freq: Three times a day (TID) | ORAL | 0 refills | Status: DC

## 2019-11-07 NOTE — Patient Instructions (Addendum)

## 2019-11-10 ENCOUNTER — Other Ambulatory Visit: Payer: Self-pay | Admitting: Podiatry

## 2019-11-10 DIAGNOSIS — M722 Plantar fascial fibromatosis: Secondary | ICD-10-CM

## 2019-11-11 DIAGNOSIS — J3089 Other allergic rhinitis: Secondary | ICD-10-CM | POA: Diagnosis not present

## 2019-11-11 NOTE — Progress Notes (Signed)
PATIENT: Brianna Gates DOB: Feb 26, 1951  REASON FOR VISIT: follow up HISTORY FROM: patient  HISTORY OF PRESENT ILLNESS: Today 11/12/19  Brianna Gates is a 69 year old female with history of Guillian Barr syndrome.  She has some residual weakness of the legs, weakness of the left gastrocnemius muscle.  Sleep evaluation showed very mild OSA, recommended CPAP or dental device therapy.  She has some arthritis in her shoulders and knees.  She is now using the dental device for sleep.  She has not had any recent falls.  She has some neuropathic discomfort in both feet, tingling, numbness, up to mid foot, used to just be in her toes.  She says the discomfort is not bothersome at night, is more troublesome during the day.  She feels the amount of weight she can lift with her arms has decreased over time.  She has noticed that her left calf is smaller than the right.  She is interested in trying a dose reduction of gabapentin.  She presents today for evaluation unaccompanied.  HISTORY 11/06/2018 Dr. Jannifer Franklin: Brianna Gates is a 69 year old right-handed white female with a history of Guillain-Barre syndrome.  The patient has some residual weakness of the legs, she has some weakness of the left gastrocnemius muscle.  She is still working on a regular basis.  She feels like the amount of weight she can lift with the arms has dropped over the last year or so.  The patient has some arthritis affecting the hands.  She will be going for a sleep study as she has been having problems with snoring.  She does have some mild gait instability, she has not had any recent falls.  She feels somewhat sleepy on gabapentin 200 mg dosing during the day, she cut back to 100 mg in the morning and 100 mg at midday.  The patient takes 400 mg gabapentin at night.  She returns to this office for an evaluation.  She reports that she has significant arthritis affecting the right knee which also impacts her ability to walk.   REVIEW  OF SYSTEMS: Out of a complete 14 system review of symptoms, the patient complains only of the following symptoms, and all other reviewed systems are negative.  Muscle pain  ALLERGIES: Allergies  Allergen Reactions  . Altace [Ramipril] Cough  . Buspar [Buspirone] Itching    Vivid dreams, diarrhea   . Captopril   . Daypro [Oxaprozin] Hives  . Zoloft [Sertraline Hcl] Diarrhea  . Ace Inhibitors     Other reaction(s): Other (See Comments) GB syndrome  . Doxycycline     Burning sensation/Rash  . Influenza Vaccines     HX   . Sulfa Antibiotics Other (See Comments)    unknown    HOME MEDICATIONS: Outpatient Medications Prior to Visit  Medication Sig Dispense Refill  . acetaminophen (TYLENOL) 650 MG CR tablet Take by mouth. As needed.    Marland Kitchen albuterol (PROVENTIL HFA;VENTOLIN HFA) 108 (90 BASE) MCG/ACT inhaler Inhale 2 puffs into the lungs every 6 (six) hours as needed for wheezing or shortness of breath.    Marland Kitchen amLODipine (NORVASC) 5 MG tablet Take 5 mg by mouth daily.    Marland Kitchen amLODipine-benazepril (LOTREL) 10-20 MG capsule daily    . Ascorbic Acid (VITAMIN C PO) Take by mouth.    Marland Kitchen azelastine (ASTELIN) 0.1 % nasal spray azelastine 137 mcg (0.1 %) nasal spray aerosol    . Carboxymethylcellulose Sodium (EYE DROPS OP) Apply 1 drop to eye 2 (two) times  daily. RETAIN EYE DROPS- GETS FROM OPTOMETRIST    . Cholecalciferol (VITAMIN D) 2000 UNITS CAPS Take 1 capsule by mouth daily.    Marland Kitchen ESTRADIOL PO Take by mouth.    Marland Kitchen ibuprofen (ADVIL,MOTRIN) 100 MG tablet Take 100 mg by mouth 2 (two) times daily as needed for pain or fever.     . levothyroxine (SYNTHROID) 50 MCG tablet Take 50 mcg by mouth daily before breakfast.     . NON FORMULARY [facility]  Antifungal (nail)-#1    . rosuvastatin (CRESTOR) 5 MG tablet Take 5 mg by mouth daily.    Marland Kitchen gabapentin (NEURONTIN) 100 MG capsule Take 100mg  po twice daily, then 400mg  at bedtime. 180 capsule 11  . cephALEXin (KEFLEX) 500 MG capsule Take 1  capsule (500 mg total) by mouth 3 (three) times daily. (Patient not taking: Reported on 11/12/2019) 21 capsule 0  . losartan (COZAAR) 50 MG tablet Take 50 mg by mouth daily.     . Multiple Vitamins-Minerals (EYE VITAMINS PO) Take 4 tablets by mouth daily. HYDRO EYES     No facility-administered medications prior to visit.    PAST MEDICAL HISTORY: Past Medical History:  Diagnosis Date  . Allergy   . Anxiety   . Asthma    INFREQUENT PROBLEM - rarely used inhaler  . Cervical spondylosis   . Cervical strain 07/22/2014  . Complication of anesthesia    UNKNOWN REACTION WITH RASH AT SITE OF IV only with knee surgery  . Degenerative arthritis   . Dyslipidemia   . Eczema   . GERD (gastroesophageal reflux disease)   . Guillain-Barre syndrome ([place])   . History of Guillain-Barre syndrome 07/22/2014  . Hyperlipidemia   . Hypertension   . Hypothyroidism   . Lumbosacral spondylosis   . Raynaud disease   . Thyroid nodule     PAST SURGICAL HISTORY: Past Surgical History:  Procedure Laterality Date  . ABDOMINAL HYSTERECTOMY    . COLONOSCOPY    . FOOT SURGERY Left   . GANGLION CYST EXCISION Left    Wrist  . HERNIA REPAIR    . KNEE ARTHROSCOPY     RT KNEE  . KNEE ARTHROTOMY Right 08/27/2013   Procedure: RIGHT KNEE SCAR EXCISION AND FEMORAL REVISION;  Surgeon: Gearlean Alf, MD;  Location: [facility];  Service: Orthopedics;  Laterality: Right;  . REPLACEMENT TOTAL KNEE BILATERAL Bilateral   . STRABISMUS SURGERY Right   . TONSILLECTOMY    . vaginal vulvo prolapse  09/2015    FAMILY HISTORY: Family History  Problem Relation Age of Onset  . Hypertension Mother   . Other Mother        Dyslipidemia  . Heart disease Father   . Other Brother        Dyslipidemia  . Stomach cancer Paternal Uncle   . Colon cancer Neg Hx   . Rectal cancer Neg Hx   . Esophageal cancer Neg Hx     SOCIAL HISTORY: Social History   Socioeconomic History  . Marital status: Married    Spouse name: Not on  file  . Number of children: 2  . Years of education: college  . Highest education level: Not on file  Occupational History    Employer: UNEMPLOYED  Tobacco Use  . Smoking status: Never Smoker  . Smokeless tobacco: Never Used  Substance and Sexual Activity  . Alcohol use: Yes    Alcohol/week: 6.0 standard drinks    Types: 6 Glasses of wine per week  Comment: wine weekly  . Drug use: No  . Sexual activity: Yes    Birth control/protection: Other-see comments, Post-menopausal    Comment: Hysterectomy  Other Topics Concern  . Not on file  Social History Narrative   Lives at home, married   Patient is right handed.   Patient drinks 2-3 cups caffeine daily.   Social Determinants of Health   Financial Resource Strain:   . Difficulty of Paying Living Expenses: Not on file  Food Insecurity:   . Worried About Charity fundraiser in the Last Year: Not on file  . Ran Out of Food in the Last Year: Not on file  Transportation Needs:   . Lack of Transportation (Medical): Not on file  . Lack of Transportation (Non-Medical): Not on file  Physical Activity:   . Days of Exercise per Week: Not on file  . Minutes of Exercise per Session: Not on file  Stress:   . Feeling of Stress : Not on file  Social Connections:   . Frequency of Communication with Friends and Family: Not on file  . Frequency of Social Gatherings with Friends and Family: Not on file  . Attends Religious Services: Not on file  . Active Member of Clubs or Organizations: Not on file  . Attends Archivist Meetings: Not on file  . Marital Status: Not on file  Intimate Partner Violence:   . Fear of Current or Ex-Partner: Not on file  . Emotionally Abused: Not on file  . Physically Abused: Not on file  . Sexually Abused: Not on file    PHYSICAL EXAM  Vitals:   11/12/19 1246  BP: 132/64  Pulse: (!) 57  Temp: (!) 97.2 F (36.2 C)  Weight: 122 lb 12.8 oz (55.7 kg)  Height: 5\' 4"  (1.626 m)   Body mass  index is 21.08 kg/m.  Generalized: Well developed, in no acute distress   Neurological examination  Mentation: Alert oriented to time, place, history taking. Follows all commands speech and language fluent Cranial nerve II-XII: Pupils were equal round reactive to light. Extraocular movements were full, visual field were full on confrontational test. Facial sensation and strength were normal.  Head turning and shoulder shrug  were normal and symmetric. Motor: Good strength in all extremities.  Left calf is noted to be smaller than the right.  Sensory: Sensory testing is intact to soft touch on all 4 extremities. No evidence of extinction is noted.  Coordination: Cerebellar testing reveals good finger-nose-finger and heel-to-shin bilaterally.  Gait and station: Gait is normal. Tandem gait is normal. Romberg is negative. No drift is seen.  Reflexes: Deep tendon reflexes are symmetric   DIAGNOSTIC DATA (LABS, IMAGING, TESTING) - I reviewed patient records, labs, notes, testing and imaging myself where available.  Lab Results  Component Value Date   WBC 8.2 08/29/2013   HGB 12.3 08/29/2013   HCT 36.8 08/29/2013   MCV 97.9 08/29/2013   PLT 153 08/29/2013      Component Value Date/Time   NA 138 08/29/2013 0405   K 3.5 08/29/2013 0405   CL 103 08/29/2013 0405   CO2 27 08/29/2013 0405   GLUCOSE 105 (H) 08/29/2013 0405   BUN 14 08/29/2013 0405   CREATININE 0.60 08/29/2013 0405   CALCIUM 9.8 08/29/2013 0405   PROT 7.0 07/26/2011 1600   ALBUMIN 4.1 07/26/2011 1600   AST 27 07/26/2011 1600   ALT 22 07/26/2011 1600   ALKPHOS 83 07/26/2011 1600   BILITOT 0.2 (  L) 07/26/2011 1600   GFRNONAA >90 08/29/2013 0405   GFRAA >90 08/29/2013 0405   No results found for: CHOL, HDL, LDLCALC, LDLDIRECT, TRIG, CHOLHDL No results found for: HGBA1C No results found for: [place] PLAN 69 y.o. year old female  has a past medical history of Allergy, Anxiety, Asthma, Cervical  spondylosis, Cervical strain ([id number]), Complication of anesthesia, Degenerative arthritis, Dyslipidemia, Eczema, GERD (gastroesophageal reflux disease), Guillain-Barre syndrome ([place]), History of Guillain-Barre syndrome (07/22/2014), Hyperlipidemia, Hypertension, Hypothyroidism, Lumbosacral spondylosis, Raynaud disease, and Thyroid nodule. here with:  1.  History of Guillian Aris Lot syndrome  She will continue taking gabapentin, but she may try to wean the dose down, as she would like to see what it feels like off the medication in her natural state.  I will send in a refill of gabapentin.  We will follow her over time, we have mainly been addressing her neuropathic discomfort.  She will follow-up in 1 year or sooner if needed.   I spent 15 minutes with the patient. 50% of this time was spent discussing her plan of care.   Butler Denmark, AGNP-C, DNP 11/12/2019, 1:19 PM [facility] [address], [place], [place] [address] [phone]

## 2019-11-12 ENCOUNTER — Ambulatory Visit (INDEPENDENT_AMBULATORY_CARE_PROVIDER_SITE_OTHER): Payer: PPO | Admitting: Neurology

## 2019-11-12 ENCOUNTER — Other Ambulatory Visit: Payer: Self-pay

## 2019-11-12 ENCOUNTER — Encounter: Payer: Self-pay | Admitting: Neurology

## 2019-11-12 VITALS — BP 132/64 | HR 57 | Temp 97.2°F | Ht 64.0 in | Wt 122.8 lb

## 2019-11-12 DIAGNOSIS — Z8669 Personal history of other diseases of the nervous system and sense organs: Secondary | ICD-10-CM | POA: Diagnosis not present

## 2019-11-12 MED ORDER — GABAPENTIN 100 MG PO CAPS: ORAL_CAPSULE | ORAL | 11 refills | Status: DC

## 2019-11-12 NOTE — Patient Instructions (Signed)
Continue gabapentin as prescribed, you may adjust the dose accordingly. I have sent a refill of the medication. We will see you back in 1 year or sooner if needed

## 2019-11-14 NOTE — Progress Notes (Signed)
I have read the note, and I agree with the clinical assessment and plan.  Yvonne Stopher K Angelly Spearing   

## 2019-11-17 NOTE — Progress Notes (Signed)
Subjective:   Patient ID: Brianna Gates, female   DOB: 69 y.o.   MRN: [id number]   HPI 69 year old female presents the office today for concerns of her big toenails becoming sensitive on the nails are curling in.  She states that she previously did have some pus coming from her right big toe and she applied peroxide and Neosporin which has been getting better.  No redness or swelling or any drainage at this time.  Also secondary concerns of pain to her left foot.  She underwent her first metatarsal osteotomy about 3 years ago for her left foot.  She states that she is not sure if the screws are moving but she is been having some discomfort.   Review of Systems  All other systems reviewed and are negative.  Past Medical History:  Diagnosis Date  . Allergy   . Anxiety   . Asthma    INFREQUENT PROBLEM - rarely used inhaler  . Cervical spondylosis   . Cervical strain 07/22/2014  . Complication of anesthesia    UNKNOWN REACTION WITH RASH AT SITE OF IV only with knee surgery  . Degenerative arthritis   . Dyslipidemia   . Eczema   . GERD (gastroesophageal reflux disease)   . Guillain-Barre syndrome ([place])   . History of Guillain-Barre syndrome 07/22/2014  . Hyperlipidemia   . Hypertension   . Hypothyroidism   . Lumbosacral spondylosis   . Raynaud disease   . Thyroid nodule     Past Surgical History:  Procedure Laterality Date  . ABDOMINAL HYSTERECTOMY    . COLONOSCOPY    . FOOT SURGERY Left   . GANGLION CYST EXCISION Left    Wrist  . HERNIA REPAIR    . KNEE ARTHROSCOPY     RT KNEE  . KNEE ARTHROTOMY Right 08/27/2013   Procedure: RIGHT KNEE SCAR EXCISION AND FEMORAL REVISION;  Surgeon: Gearlean Alf, MD;  Location: [facility];  Service: Orthopedics;  Laterality: Right;  . REPLACEMENT TOTAL KNEE BILATERAL Bilateral   . STRABISMUS SURGERY Right   . TONSILLECTOMY    . vaginal vulvo prolapse  09/2015     Current Outpatient Medications:  .  ESTRADIOL PO, Take by mouth.,  Disp: , Rfl:  .  NON FORMULARY, [facility]  Antifungal (nail)-#1, Disp: , Rfl:  .  acetaminophen (TYLENOL) 650 MG CR tablet, Take by mouth. As needed., Disp: , Rfl:  .  albuterol (PROVENTIL HFA;VENTOLIN HFA) 108 (90 BASE) MCG/ACT inhaler, Inhale 2 puffs into the lungs every 6 (six) hours as needed for wheezing or shortness of breath., Disp: , Rfl:  .  amLODipine (NORVASC) 5 MG tablet, Take 5 mg by mouth daily., Disp: , Rfl:  .  amLODipine-benazepril (LOTREL) 10-20 MG capsule, daily, Disp: , Rfl:  .  Ascorbic Acid (VITAMIN C PO), Take by mouth., Disp: , Rfl:  .  azelastine (ASTELIN) 0.1 % nasal spray, azelastine 137 mcg (0.1 %) nasal spray aerosol, Disp: , Rfl:  .  Carboxymethylcellulose Sodium (EYE DROPS OP), Apply 1 drop to eye 2 (two) times daily. RETAIN EYE DROPS- GETS FROM OPTOMETRIST, Disp: , Rfl:  .  Cholecalciferol (VITAMIN D) 2000 UNITS CAPS, Take 1 capsule by mouth daily., Disp: , Rfl:  .  gabapentin (NEURONTIN) 100 MG capsule, Take 100mg  po twice daily, then 400mg  at bedtime., Disp: 180 capsule, Rfl: 11 .  ibuprofen (ADVIL,MOTRIN) 100 MG tablet, Take 100 mg by mouth 2 (two) times daily as needed for pain or fever. , Disp: ,  Rfl:  .  levothyroxine (SYNTHROID) 50 MCG tablet, Take 50 mcg by mouth daily before breakfast. , Disp: , Rfl:  .  rosuvastatin (CRESTOR) 5 MG tablet, Take 5 mg by mouth daily., Disp: , Rfl:   Allergies  Allergen Reactions  . Altace [Ramipril] Cough  . Buspar [Buspirone] Itching    Vivid dreams, diarrhea   . Captopril   . Daypro [Oxaprozin] Hives  . Zoloft [Sertraline Hcl] Diarrhea  . Ace Inhibitors     Other reaction(s): Other (See Comments) GB syndrome  . Doxycycline     Burning sensation/Rash  . Influenza Vaccines     HX   . Sulfa Antibiotics Other (See Comments)    unknown         Objective:  Physical Exam  General: AAO x3, NAD  Dermatological: Incurvation present to both the medial lateral aspects of the hallux toenail on the right  side and there is tenderness in the very distal aspect of the nail.  There is localized edema but there is no drainage or possibly cellulitis identified.  There is no open lesions.  Vascular: Dorsalis Pedis artery and Posterior Tibial artery pedal pulses are 2/4 bilateral with immedate capillary fill time.  There is no pain with calf compression, swelling, warmth, erythema.   Neruologic: Grossly intact via light touch bilateral.   Musculoskeletal: Mild tenderness along the first metatarsal on the left side and there is mild tenderness with first MPJ range of motion.  No crepitation.  No other areas of discomfort.  Gait: Unassisted, Nonantalgic.       Assessment:   69 year old female ingrown toenail right side; left foot pain, capsulitis     Plan:  -Treatment options discussed including all alternatives, risks, and complications -Etiology of symptoms were discussed  1.  Ingrown toenail right -Discussed conservative as well as surgical options.  Today I debrided the nail without any complications or bleeding.  Recommended continue Epson salt soaks as well as antibiotic ointment.  Prescribed cephalexin.  Should him symptoms continue only to do a partial nail avulsion with chemical matricectomy she understands.  2.  Left foot pain, capsulitis -X-rays obtained reviewed.  Status post first metatarsal osteotomy.  Hardware is intact.  We discussed treatment options including shoe modifications, orthotics as well as physical therapy.  She wants to hold off on that for now.  Trula Slade DPM

## 2019-11-19 DIAGNOSIS — J3089 Other allergic rhinitis: Secondary | ICD-10-CM | POA: Diagnosis not present

## 2019-11-20 ENCOUNTER — Telehealth: Payer: Self-pay | Admitting: *Deleted

## 2019-11-20 NOTE — Telephone Encounter (Signed)
Patient called and stated that the left big toenail is infected and is painful and patient is soaking the nail and did finish all the antibiotics and just wanted to have it checked out and the patient is scheduled for tomorrow with Dr Posey Pronto at 8:15 am. Lattie Haw

## 2019-11-21 ENCOUNTER — Ambulatory Visit: Payer: PPO | Admitting: Podiatry

## 2019-11-21 ENCOUNTER — Other Ambulatory Visit: Payer: Self-pay

## 2019-11-21 DIAGNOSIS — L03032 Cellulitis of left toe: Secondary | ICD-10-CM

## 2019-11-21 MED ORDER — CEPHALEXIN 500 MG PO CAPS: 500.0000 mg | ORAL_CAPSULE | Freq: Three times a day (TID) | ORAL | 0 refills | Status: DC

## 2019-11-24 ENCOUNTER — Encounter: Payer: Self-pay | Admitting: Podiatry

## 2019-11-24 NOTE — Progress Notes (Signed)
Subjective:  Patient ID: Brianna Gates, female    DOB: Feb 27, 1951,  MRN: [id number]  Chief Complaint  Patient presents with  . Ingrown Toenail    pt is here for a f/u on ingrown toenail on the right big toenail, pt states that she is still feeling tender on the right big toenail, pt is also looking to get her left big toenail lateral to medial side looked at as well.    69 y.o. female presents with the above complaint.  Patient presents with ingrown to the left big toenail medial lateral border especially the lateral border.  Patient states is painful to touch.  She states is feeling very sensitive to the area.  There is redness circumferentially around the toe.  Patient states she has been soaking in Epsom salt which has helped a little bit but not made the pain go away.  Patient is also concerned with antifungal toenails as well.  She is allergic to doxycycline.  She denies taking any antibiotics for this.  She has a history of Raynaud's.  She denies any other acute complaints.  She is also being treated for Ethelene Hal syndrome   Review of Systems: Negative except as noted in the HPI. Denies N/V/F/Ch.  Past Medical History:  Diagnosis Date  . Allergy   . Anxiety   . Asthma    INFREQUENT PROBLEM - rarely used inhaler  . Cervical spondylosis   . Cervical strain 07/22/2014  . Complication of anesthesia    UNKNOWN REACTION WITH RASH AT SITE OF IV only with knee surgery  . Degenerative arthritis   . Dyslipidemia   . Eczema   . GERD (gastroesophageal reflux disease)   . Guillain-Barre syndrome ([place])   . History of Guillain-Barre syndrome 07/22/2014  . Hyperlipidemia   . Hypertension   . Hypothyroidism   . Lumbosacral spondylosis   . Raynaud disease   . Thyroid nodule     Current Outpatient Medications:  .  acetaminophen (TYLENOL) 650 MG CR tablet, Take by mouth. As needed., Disp: , Rfl:  .  albuterol (PROVENTIL HFA;VENTOLIN HFA) 108 (90 BASE) MCG/ACT inhaler, Inhale 2 puffs  into the lungs every 6 (six) hours as needed for wheezing or shortness of breath., Disp: , Rfl:  .  amLODipine (NORVASC) 5 MG tablet, Take 5 mg by mouth daily., Disp: , Rfl:  .  amLODipine-benazepril (LOTREL) 10-20 MG capsule, daily, Disp: , Rfl:  .  Ascorbic Acid (VITAMIN C PO), Take by mouth., Disp: , Rfl:  .  azelastine (ASTELIN) 0.1 % nasal spray, azelastine 137 mcg (0.1 %) nasal spray aerosol, Disp: , Rfl:  .  Carboxymethylcellulose Sodium (EYE DROPS OP), Apply 1 drop to eye 2 (two) times daily. RETAIN EYE DROPS- GETS FROM OPTOMETRIST, Disp: , Rfl:  .  Cholecalciferol (VITAMIN D) 2000 UNITS CAPS, Take 1 capsule by mouth daily., Disp: , Rfl:  .  ESTRADIOL PO, Take by mouth., Disp: , Rfl:  .  gabapentin (NEURONTIN) 100 MG capsule, Take 100mg  po twice daily, then 400mg  at bedtime., Disp: 180 capsule, Rfl: 11 .  ibuprofen (ADVIL,MOTRIN) 100 MG tablet, Take 100 mg by mouth 2 (two) times daily as needed for pain or fever. , Disp: , Rfl:  .  levothyroxine (SYNTHROID) 50 MCG tablet, Take 50 mcg by mouth daily before breakfast. , Disp: , Rfl:  .  NON FORMULARY, [facility]  Antifungal (nail)-#1, Disp: , Rfl:  .  rosuvastatin (CRESTOR) 5 MG tablet, Take 5 mg by mouth daily.,  Disp: , Rfl:  .  cephALEXin (KEFLEX) 500 MG capsule, Take 1 capsule (500 mg total) by mouth 3 (three) times daily., Disp: 21 capsule, Rfl: 0  Social History   Tobacco Use  Smoking Status Never Smoker  Smokeless Tobacco Never Used    Allergies  Allergen Reactions  . Altace [Ramipril] Cough  . Buspar [Buspirone] Itching    Vivid dreams, diarrhea   . Captopril   . Daypro [Oxaprozin] Hives  . Zoloft [Sertraline Hcl] Diarrhea  . Ace Inhibitors     Other reaction(s): Other (See Comments) GB syndrome  . Doxycycline     Burning sensation/Rash  . Influenza Vaccines     HX   . Sulfa Antibiotics Other (See Comments)    unknown   Objective:  There were no vitals filed for this visit. There is no height or weight  on file to calculate BMI. Constitutional Well developed. Well nourished.  Vascular Dorsalis pedis pulses palpable bilaterally. Posterior tibial pulses palpable bilaterally. Capillary refill normal to all digits.  No cyanosis or clubbing noted. Pedal hair growth normal.  Neurologic Normal speech. Oriented to person, place, and time. Epicritic sensation to light touch grossly present bilaterally.  Dermatologic Painful ingrowing nail at Both medial lateral border nail borders of the hallux nail left. No other open wounds. No skin lesions.  Orthopedic: Normal joint ROM without pain or crepitus bilaterally. No visible deformities. No bony tenderness.   Radiographs: None Assessment:   1. Paronychia of great toe, left    Plan:  Patient was evaluated and treated and all questions answered.  Ingrown Nail, left with paronychia -Given there is erythema without sign of purulent drainage surrounding the left great toe, I believe patient will benefit from antibiotic therapy for next7 days.  At this time given her comorbidities including Raynaud's with concern for poor vascular flow to the lower extremity now I will hold off on any ingrown procedures for now.  If the infection worsens and/or is not responding to antibiotics at that point the risks outweighs the benefits of the procedure.  But at this time I will hold off on an ingrown toenail procedure. Patient will see Dr. Jacqualyn Posey back again in 2 weeks.  Return in about 9 weeks (around 01/23/2020).

## 2019-11-26 DIAGNOSIS — M24661 Ankylosis, right knee: Secondary | ICD-10-CM | POA: Diagnosis not present

## 2019-11-26 DIAGNOSIS — M1710 Unilateral primary osteoarthritis, unspecified knee: Secondary | ICD-10-CM | POA: Diagnosis not present

## 2019-11-26 DIAGNOSIS — Z96651 Presence of right artificial knee joint: Secondary | ICD-10-CM | POA: Diagnosis not present

## 2019-11-27 ENCOUNTER — Ambulatory Visit: Payer: PPO | Attending: Internal Medicine

## 2019-11-27 DIAGNOSIS — Z23 Encounter for immunization: Secondary | ICD-10-CM | POA: Insufficient documentation

## 2019-11-27 NOTE — Progress Notes (Signed)
   Covid-19 Vaccination Clinic  Name:  Brianna Gates    MRN: [id number] DOB: 1951-08-20  11/27/2019  Brianna Gates was observed post Covid-19 immunization for 15 minutes without incidence. She was provided with Vaccine Information Sheet and instruction to access the V-Safe system.   Brianna Gates was instructed to call 911 with any severe reactions post vaccine: Marland Kitchen Difficulty breathing  . Swelling of your face and throat  . A fast heartbeat  . A bad rash all over your body  . Dizziness and weakness    Immunizations Administered    Name Date Dose VIS Date Route   Pfizer COVID-19 Vaccine 11/27/2019 10:30 AM 0.3 mL 09/26/2019 Intramuscular   Manufacturer: [employer], [employer]   Lot: [id number]   [place]: [id number]

## 2019-12-08 DIAGNOSIS — J3089 Other allergic rhinitis: Secondary | ICD-10-CM | POA: Diagnosis not present

## 2019-12-11 DIAGNOSIS — J3089 Other allergic rhinitis: Secondary | ICD-10-CM | POA: Diagnosis not present

## 2019-12-23 DIAGNOSIS — E039 Hypothyroidism, unspecified: Secondary | ICD-10-CM | POA: Diagnosis not present

## 2019-12-23 DIAGNOSIS — E785 Hyperlipidemia, unspecified: Secondary | ICD-10-CM | POA: Diagnosis not present

## 2019-12-23 DIAGNOSIS — J3089 Other allergic rhinitis: Secondary | ICD-10-CM | POA: Diagnosis not present

## 2019-12-23 DIAGNOSIS — F419 Anxiety disorder, unspecified: Secondary | ICD-10-CM | POA: Diagnosis not present

## 2019-12-23 DIAGNOSIS — I10 Essential (primary) hypertension: Secondary | ICD-10-CM | POA: Diagnosis not present

## 2019-12-25 DIAGNOSIS — J3089 Other allergic rhinitis: Secondary | ICD-10-CM | POA: Diagnosis not present

## 2020-01-05 DIAGNOSIS — J3089 Other allergic rhinitis: Secondary | ICD-10-CM | POA: Diagnosis not present

## 2020-01-12 DIAGNOSIS — J3089 Other allergic rhinitis: Secondary | ICD-10-CM | POA: Diagnosis not present

## 2020-01-26 ENCOUNTER — Encounter: Payer: Self-pay | Admitting: Podiatry

## 2020-01-26 ENCOUNTER — Other Ambulatory Visit: Payer: Self-pay

## 2020-01-26 ENCOUNTER — Ambulatory Visit: Payer: PPO | Admitting: Podiatry

## 2020-01-26 DIAGNOSIS — I7300 Raynaud's syndrome without gangrene: Secondary | ICD-10-CM

## 2020-01-26 DIAGNOSIS — M79674 Pain in right toe(s): Secondary | ICD-10-CM | POA: Diagnosis not present

## 2020-01-26 DIAGNOSIS — M79675 Pain in left toe(s): Secondary | ICD-10-CM

## 2020-01-26 DIAGNOSIS — J3089 Other allergic rhinitis: Secondary | ICD-10-CM | POA: Diagnosis not present

## 2020-01-26 DIAGNOSIS — L600 Ingrowing nail: Secondary | ICD-10-CM

## 2020-01-26 NOTE — Patient Instructions (Signed)

## 2020-02-01 NOTE — Progress Notes (Signed)
Subjective: 69 year old female presents the office today for follow-up evaluation of ingrown toenail, nail fungus.  She had seen Dr. Posey Pronto last month for paronychia of the left side.  She states the right toenail is somewhat tender.  The left side is been tender as well.  No drainage or pus. Denies any systemic complaints such as fevers, chills, nausea, vomiting. No acute changes since last appointment, and no other complaints at this time.   Objective: AAO x3, NAD DP/PT pulses palpable bilaterally, CRT less than 3 seconds Mild incurvation to the hallux toenails bilaterally.  There is no edema, erythema or signs of infection noted today.  Nails are mildly dystrophic, discolored with yellow-brown discoloration. No open lesions or pre-ulcerative lesions.  No pain with calf compression, swelling, warmth, erythema  Assessment: Chronic ingrown toenails  Plan: -All treatment options discussed with the patient including all alternatives, risks, complications.  -We again discussed partial nail avulsion.  She is concerned about the healing.  I did sharply debride the symptomatic portion of the nails with any complications or bleeding.  She did Epson salt soaks as well as antibiotic ointment.  She is to consider the partial nail avulsion. -Patient encouraged to call the office with any questions, concerns, change in symptoms.   Trula Slade DPM

## 2020-02-04 DIAGNOSIS — J3089 Other allergic rhinitis: Secondary | ICD-10-CM | POA: Diagnosis not present

## 2020-02-17 DIAGNOSIS — J3089 Other allergic rhinitis: Secondary | ICD-10-CM | POA: Diagnosis not present

## 2020-02-26 DIAGNOSIS — J3089 Other allergic rhinitis: Secondary | ICD-10-CM | POA: Diagnosis not present

## 2020-03-02 DIAGNOSIS — H3561 Retinal hemorrhage, right eye: Secondary | ICD-10-CM | POA: Diagnosis not present

## 2020-03-02 DIAGNOSIS — H43813 Vitreous degeneration, bilateral: Secondary | ICD-10-CM | POA: Diagnosis not present

## 2020-03-02 DIAGNOSIS — H5211 Myopia, right eye: Secondary | ICD-10-CM | POA: Diagnosis not present

## 2020-03-02 DIAGNOSIS — H0100A Unspecified blepharitis right eye, upper and lower eyelids: Secondary | ICD-10-CM | POA: Diagnosis not present

## 2020-03-02 DIAGNOSIS — H52222 Regular astigmatism, left eye: Secondary | ICD-10-CM | POA: Diagnosis not present

## 2020-03-02 DIAGNOSIS — H524 Presbyopia: Secondary | ICD-10-CM | POA: Diagnosis not present

## 2020-03-02 DIAGNOSIS — H16223 Keratoconjunctivitis sicca, not specified as Sjogren's, bilateral: Secondary | ICD-10-CM | POA: Diagnosis not present

## 2020-03-02 DIAGNOSIS — H5212 Myopia, left eye: Secondary | ICD-10-CM | POA: Diagnosis not present

## 2020-03-03 DIAGNOSIS — J3089 Other allergic rhinitis: Secondary | ICD-10-CM | POA: Diagnosis not present

## 2020-03-16 DIAGNOSIS — J3089 Other allergic rhinitis: Secondary | ICD-10-CM | POA: Diagnosis not present

## 2020-03-22 DIAGNOSIS — Z6821 Body mass index (BMI) 21.0-21.9, adult: Secondary | ICD-10-CM | POA: Diagnosis not present

## 2020-03-22 DIAGNOSIS — Z1231 Encounter for screening mammogram for malignant neoplasm of breast: Secondary | ICD-10-CM | POA: Diagnosis not present

## 2020-03-22 DIAGNOSIS — Z79890 Hormone replacement therapy (postmenopausal): Secondary | ICD-10-CM | POA: Diagnosis not present

## 2020-03-22 DIAGNOSIS — Z01419 Encounter for gynecological examination (general) (routine) without abnormal findings: Secondary | ICD-10-CM | POA: Diagnosis not present

## 2020-03-23 DIAGNOSIS — J3089 Other allergic rhinitis: Secondary | ICD-10-CM | POA: Diagnosis not present

## 2020-03-23 DIAGNOSIS — J301 Allergic rhinitis due to pollen: Secondary | ICD-10-CM | POA: Diagnosis not present

## 2020-04-05 ENCOUNTER — Other Ambulatory Visit: Payer: Self-pay

## 2020-04-05 ENCOUNTER — Ambulatory Visit: Payer: PPO | Admitting: Podiatry

## 2020-04-05 DIAGNOSIS — L600 Ingrowing nail: Secondary | ICD-10-CM | POA: Diagnosis not present

## 2020-04-05 DIAGNOSIS — M79675 Pain in left toe(s): Secondary | ICD-10-CM

## 2020-04-05 DIAGNOSIS — M79674 Pain in right toe(s): Secondary | ICD-10-CM

## 2020-04-05 DIAGNOSIS — I7300 Raynaud's syndrome without gangrene: Secondary | ICD-10-CM

## 2020-04-06 DIAGNOSIS — J301 Allergic rhinitis due to pollen: Secondary | ICD-10-CM | POA: Diagnosis not present

## 2020-04-06 DIAGNOSIS — J3089 Other allergic rhinitis: Secondary | ICD-10-CM | POA: Diagnosis not present

## 2020-04-11 NOTE — Progress Notes (Signed)
Subjective: 69 year old female presents the office today for follow-up evaluation of ingrown toenail.  She states that she has been doing well but needs to have the left big toenail trimmed surgically ingrown.  Denies any drainage or pus any swelling or redness.  It still become an ongoing issue for her. No acute changes since last appointment, and no other complaints at this time.   Objective: AAO x3, NAD DP/PT pulses palpable bilaterally, CRT less than 3 seconds Mild incurvation to the hallux toenails bilaterally.  There is no edema, erythema or signs of infection noted today.  Nails are mildly dystrophic, discolored with yellow-brown discoloration. No open lesions or pre-ulcerative lesions.  No pain with calf compression, swelling, warmth, erythema  Assessment: Chronic ingrown toenails-without signs of infection  Plan: -All treatment options discussed with the patient including all alternatives, risks, complications.  -We again discussed partial nail avulsion.  She is concerned about the healing however this is been an ongoing issue for her and concerned about getting further infection.  Sharply debrided the nails with any complications or bleeding.  She consider having the procedure done in the future.  Return in about 9 weeks (around 06/07/2020).  Trula Slade DPM

## 2020-04-14 DIAGNOSIS — J3089 Other allergic rhinitis: Secondary | ICD-10-CM | POA: Diagnosis not present

## 2020-04-16 DIAGNOSIS — J3089 Other allergic rhinitis: Secondary | ICD-10-CM | POA: Diagnosis not present

## 2020-05-03 DIAGNOSIS — E041 Nontoxic single thyroid nodule: Secondary | ICD-10-CM | POA: Diagnosis not present

## 2020-05-03 DIAGNOSIS — E7849 Other hyperlipidemia: Secondary | ICD-10-CM | POA: Diagnosis not present

## 2020-05-04 DIAGNOSIS — J3089 Other allergic rhinitis: Secondary | ICD-10-CM | POA: Diagnosis not present

## 2020-05-04 DIAGNOSIS — J301 Allergic rhinitis due to pollen: Secondary | ICD-10-CM | POA: Diagnosis not present

## 2020-05-10 DIAGNOSIS — F909 Attention-deficit hyperactivity disorder, unspecified type: Secondary | ICD-10-CM | POA: Diagnosis not present

## 2020-05-10 DIAGNOSIS — I10 Essential (primary) hypertension: Secondary | ICD-10-CM | POA: Diagnosis not present

## 2020-05-10 DIAGNOSIS — Z0000 Encounter for general adult medical examination without abnormal findings: Secondary | ICD-10-CM | POA: Diagnosis not present

## 2020-05-10 DIAGNOSIS — Z1212 Encounter for screening for malignant neoplasm of rectum: Secondary | ICD-10-CM | POA: Diagnosis not present

## 2020-05-10 DIAGNOSIS — M3500 Sicca syndrome, unspecified: Secondary | ICD-10-CM | POA: Diagnosis not present

## 2020-05-10 DIAGNOSIS — R062 Wheezing: Secondary | ICD-10-CM | POA: Diagnosis not present

## 2020-05-10 DIAGNOSIS — E039 Hypothyroidism, unspecified: Secondary | ICD-10-CM | POA: Diagnosis not present

## 2020-05-10 DIAGNOSIS — G629 Polyneuropathy, unspecified: Secondary | ICD-10-CM | POA: Diagnosis not present

## 2020-05-10 DIAGNOSIS — F419 Anxiety disorder, unspecified: Secondary | ICD-10-CM | POA: Diagnosis not present

## 2020-05-10 DIAGNOSIS — J3089 Other allergic rhinitis: Secondary | ICD-10-CM | POA: Diagnosis not present

## 2020-05-10 DIAGNOSIS — E785 Hyperlipidemia, unspecified: Secondary | ICD-10-CM | POA: Diagnosis not present

## 2020-05-10 DIAGNOSIS — R82998 Other abnormal findings in urine: Secondary | ICD-10-CM | POA: Diagnosis not present

## 2020-05-10 DIAGNOSIS — I7300 Raynaud's syndrome without gangrene: Secondary | ICD-10-CM | POA: Diagnosis not present

## 2020-05-10 DIAGNOSIS — Z1331 Encounter for screening for depression: Secondary | ICD-10-CM | POA: Diagnosis not present

## 2020-05-10 DIAGNOSIS — M1990 Unspecified osteoarthritis, unspecified site: Secondary | ICD-10-CM | POA: Diagnosis not present

## 2020-05-10 DIAGNOSIS — L409 Psoriasis, unspecified: Secondary | ICD-10-CM | POA: Diagnosis not present

## 2020-05-12 DIAGNOSIS — L409 Psoriasis, unspecified: Secondary | ICD-10-CM | POA: Diagnosis not present

## 2020-05-12 DIAGNOSIS — Z0000 Encounter for general adult medical examination without abnormal findings: Secondary | ICD-10-CM | POA: Diagnosis not present

## 2020-05-12 DIAGNOSIS — I7300 Raynaud's syndrome without gangrene: Secondary | ICD-10-CM | POA: Diagnosis not present

## 2020-05-12 DIAGNOSIS — E039 Hypothyroidism, unspecified: Secondary | ICD-10-CM | POA: Diagnosis not present

## 2020-05-12 DIAGNOSIS — F909 Attention-deficit hyperactivity disorder, unspecified type: Secondary | ICD-10-CM | POA: Diagnosis not present

## 2020-05-12 DIAGNOSIS — E785 Hyperlipidemia, unspecified: Secondary | ICD-10-CM | POA: Diagnosis not present

## 2020-05-12 DIAGNOSIS — M3500 Sicca syndrome, unspecified: Secondary | ICD-10-CM | POA: Diagnosis not present

## 2020-05-12 DIAGNOSIS — Z1331 Encounter for screening for depression: Secondary | ICD-10-CM | POA: Diagnosis not present

## 2020-05-12 DIAGNOSIS — I10 Essential (primary) hypertension: Secondary | ICD-10-CM | POA: Diagnosis not present

## 2020-05-12 DIAGNOSIS — M1990 Unspecified osteoarthritis, unspecified site: Secondary | ICD-10-CM | POA: Diagnosis not present

## 2020-05-12 DIAGNOSIS — G629 Polyneuropathy, unspecified: Secondary | ICD-10-CM | POA: Diagnosis not present

## 2020-05-12 DIAGNOSIS — F419 Anxiety disorder, unspecified: Secondary | ICD-10-CM | POA: Diagnosis not present

## 2020-05-18 DIAGNOSIS — J3089 Other allergic rhinitis: Secondary | ICD-10-CM | POA: Diagnosis not present

## 2020-05-21 DIAGNOSIS — M8588 Other specified disorders of bone density and structure, other site: Secondary | ICD-10-CM | POA: Diagnosis not present

## 2020-05-21 DIAGNOSIS — N958 Other specified menopausal and perimenopausal disorders: Secondary | ICD-10-CM | POA: Diagnosis not present

## 2020-05-25 DIAGNOSIS — J3089 Other allergic rhinitis: Secondary | ICD-10-CM | POA: Diagnosis not present

## 2020-05-28 DIAGNOSIS — J3089 Other allergic rhinitis: Secondary | ICD-10-CM | POA: Diagnosis not present

## 2020-05-31 ENCOUNTER — Telehealth: Payer: Self-pay | Admitting: Neurology

## 2020-05-31 NOTE — Telephone Encounter (Signed)
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  Please call

## 2020-05-31 NOTE — Telephone Encounter (Signed)
I called the patient.  A booster shot for her is not recommended at this time, only indicated currently for individuals on immunosuppressant agents or individuals with cancer.

## 2020-06-07 ENCOUNTER — Other Ambulatory Visit: Payer: Self-pay

## 2020-06-07 ENCOUNTER — Ambulatory Visit: Payer: PPO | Admitting: Podiatry

## 2020-06-07 DIAGNOSIS — B351 Tinea unguium: Secondary | ICD-10-CM

## 2020-06-07 DIAGNOSIS — L600 Ingrowing nail: Secondary | ICD-10-CM | POA: Diagnosis not present

## 2020-06-07 DIAGNOSIS — M79674 Pain in right toe(s): Secondary | ICD-10-CM | POA: Diagnosis not present

## 2020-06-07 DIAGNOSIS — M79675 Pain in left toe(s): Secondary | ICD-10-CM

## 2020-06-07 DIAGNOSIS — I7300 Raynaud's syndrome without gangrene: Secondary | ICD-10-CM

## 2020-06-07 DIAGNOSIS — J3089 Other allergic rhinitis: Secondary | ICD-10-CM | POA: Diagnosis not present

## 2020-06-07 NOTE — Progress Notes (Signed)
Subjective: 69 year old female presents the office today for follow-up evaluation of ingrown toenail to her big toes.  Otherwise she has been doing well.  Denies any drainage or pus any swelling or redness.  It still become an ongoing issue for her. No acute changes since last appointment, and no other complaints at this time.   Objective: AAO x3, NAD DP/PT pulses palpable bilaterally, CRT less than 3 seconds Mild incurvation to the hallux toenails bilaterally.  There is no edema, erythema or signs of infection noted today.  Nails are mildly dystrophic, discolored with yellow-brown discoloration.   No open lesions or pre-ulcerative lesions.  No pain with calf compression, swelling, warmth, erythema  Assessment: Chronic ingrown toenails-without signs of infection  Plan: -All treatment options discussed with the patient including all alternatives, risks, complications.  -We again discussed partial nail avulsion.-Operatively the nails with any complications or bleeding x 2  Brianna Gates DPM

## 2020-06-11 DIAGNOSIS — J3089 Other allergic rhinitis: Secondary | ICD-10-CM | POA: Diagnosis not present

## 2020-06-18 DIAGNOSIS — Z20828 Contact with and (suspected) exposure to other viral communicable diseases: Secondary | ICD-10-CM | POA: Diagnosis not present

## 2020-06-18 DIAGNOSIS — Z1152 Encounter for screening for COVID-19: Secondary | ICD-10-CM | POA: Diagnosis not present

## 2020-06-18 DIAGNOSIS — J029 Acute pharyngitis, unspecified: Secondary | ICD-10-CM | POA: Diagnosis not present

## 2020-06-18 DIAGNOSIS — J309 Allergic rhinitis, unspecified: Secondary | ICD-10-CM | POA: Diagnosis not present

## 2020-06-23 DIAGNOSIS — J3089 Other allergic rhinitis: Secondary | ICD-10-CM | POA: Diagnosis not present

## 2020-07-01 DIAGNOSIS — Z6821 Body mass index (BMI) 21.0-21.9, adult: Secondary | ICD-10-CM | POA: Diagnosis not present

## 2020-07-01 DIAGNOSIS — M150 Primary generalized (osteo)arthritis: Secondary | ICD-10-CM | POA: Diagnosis not present

## 2020-07-01 DIAGNOSIS — M3500 Sicca syndrome, unspecified: Secondary | ICD-10-CM | POA: Diagnosis not present

## 2020-07-01 DIAGNOSIS — I7300 Raynaud's syndrome without gangrene: Secondary | ICD-10-CM | POA: Diagnosis not present

## 2020-07-06 DIAGNOSIS — J3089 Other allergic rhinitis: Secondary | ICD-10-CM | POA: Diagnosis not present

## 2020-07-06 DIAGNOSIS — L820 Inflamed seborrheic keratosis: Secondary | ICD-10-CM | POA: Diagnosis not present

## 2020-07-16 DIAGNOSIS — Z01818 Encounter for other preprocedural examination: Secondary | ICD-10-CM | POA: Insufficient documentation

## 2020-07-16 DIAGNOSIS — M1710 Unilateral primary osteoarthritis, unspecified knee: Secondary | ICD-10-CM | POA: Diagnosis not present

## 2020-07-16 DIAGNOSIS — M24661 Ankylosis, right knee: Secondary | ICD-10-CM | POA: Diagnosis not present

## 2020-07-16 DIAGNOSIS — Z96651 Presence of right artificial knee joint: Secondary | ICD-10-CM | POA: Diagnosis not present

## 2020-07-19 DIAGNOSIS — J3089 Other allergic rhinitis: Secondary | ICD-10-CM | POA: Diagnosis not present

## 2020-07-23 DIAGNOSIS — I10 Essential (primary) hypertension: Secondary | ICD-10-CM | POA: Diagnosis not present

## 2020-07-23 DIAGNOSIS — F419 Anxiety disorder, unspecified: Secondary | ICD-10-CM | POA: Diagnosis not present

## 2020-07-23 DIAGNOSIS — R5383 Other fatigue: Secondary | ICD-10-CM | POA: Diagnosis not present

## 2020-07-23 DIAGNOSIS — Z7689 Persons encountering health services in other specified circumstances: Secondary | ICD-10-CM | POA: Diagnosis not present

## 2020-07-23 DIAGNOSIS — E039 Hypothyroidism, unspecified: Secondary | ICD-10-CM | POA: Diagnosis not present

## 2020-07-27 DIAGNOSIS — J3089 Other allergic rhinitis: Secondary | ICD-10-CM | POA: Diagnosis not present

## 2020-07-29 DIAGNOSIS — R55 Syncope and collapse: Secondary | ICD-10-CM | POA: Diagnosis not present

## 2020-07-29 DIAGNOSIS — I10 Essential (primary) hypertension: Secondary | ICD-10-CM | POA: Diagnosis not present

## 2020-07-31 DIAGNOSIS — Z887 Allergy status to serum and vaccine status: Secondary | ICD-10-CM | POA: Diagnosis not present

## 2020-07-31 DIAGNOSIS — G610 Guillain-Barre syndrome: Secondary | ICD-10-CM | POA: Diagnosis not present

## 2020-07-31 DIAGNOSIS — R338 Other retention of urine: Secondary | ICD-10-CM | POA: Diagnosis not present

## 2020-07-31 DIAGNOSIS — Z79891 Long term (current) use of opiate analgesic: Secondary | ICD-10-CM | POA: Diagnosis not present

## 2020-07-31 DIAGNOSIS — E785 Hyperlipidemia, unspecified: Secondary | ICD-10-CM | POA: Diagnosis not present

## 2020-07-31 DIAGNOSIS — I7300 Raynaud's syndrome without gangrene: Secondary | ICD-10-CM | POA: Diagnosis not present

## 2020-07-31 DIAGNOSIS — T84092A Other mechanical complication of internal right knee prosthesis, initial encounter: Secondary | ICD-10-CM | POA: Diagnosis not present

## 2020-07-31 DIAGNOSIS — M47816 Spondylosis without myelopathy or radiculopathy, lumbar region: Secondary | ICD-10-CM | POA: Diagnosis not present

## 2020-07-31 DIAGNOSIS — E079 Disorder of thyroid, unspecified: Secondary | ICD-10-CM | POA: Diagnosis not present

## 2020-07-31 DIAGNOSIS — M8580 Other specified disorders of bone density and structure, unspecified site: Secondary | ICD-10-CM | POA: Diagnosis not present

## 2020-07-31 DIAGNOSIS — Z8669 Personal history of other diseases of the nervous system and sense organs: Secondary | ICD-10-CM | POA: Diagnosis not present

## 2020-07-31 DIAGNOSIS — Z888 Allergy status to other drugs, medicaments and biological substances status: Secondary | ICD-10-CM | POA: Diagnosis not present

## 2020-07-31 DIAGNOSIS — N9989 Other postprocedural complications and disorders of genitourinary system: Secondary | ICD-10-CM | POA: Diagnosis not present

## 2020-07-31 DIAGNOSIS — H9193 Unspecified hearing loss, bilateral: Secondary | ICD-10-CM | POA: Diagnosis not present

## 2020-07-31 DIAGNOSIS — Z20822 Contact with and (suspected) exposure to covid-19: Secondary | ICD-10-CM | POA: Diagnosis not present

## 2020-07-31 DIAGNOSIS — Z791 Long term (current) use of non-steroidal anti-inflammatories (NSAID): Secondary | ICD-10-CM | POA: Diagnosis not present

## 2020-07-31 DIAGNOSIS — M170 Bilateral primary osteoarthritis of knee: Secondary | ICD-10-CM | POA: Diagnosis not present

## 2020-07-31 DIAGNOSIS — Z96651 Presence of right artificial knee joint: Secondary | ICD-10-CM | POA: Diagnosis not present

## 2020-07-31 DIAGNOSIS — M25661 Stiffness of right knee, not elsewhere classified: Secondary | ICD-10-CM | POA: Diagnosis not present

## 2020-07-31 DIAGNOSIS — M7989 Other specified soft tissue disorders: Secondary | ICD-10-CM | POA: Diagnosis not present

## 2020-07-31 DIAGNOSIS — T84032A Mechanical loosening of internal right knee prosthetic joint, initial encounter: Secondary | ICD-10-CM | POA: Diagnosis not present

## 2020-07-31 DIAGNOSIS — Z886 Allergy status to analgesic agent status: Secondary | ICD-10-CM | POA: Diagnosis not present

## 2020-07-31 DIAGNOSIS — I10 Essential (primary) hypertension: Secondary | ICD-10-CM | POA: Diagnosis not present

## 2020-07-31 DIAGNOSIS — G4733 Obstructive sleep apnea (adult) (pediatric): Secondary | ICD-10-CM | POA: Diagnosis not present

## 2020-07-31 DIAGNOSIS — G8918 Other acute postprocedural pain: Secondary | ICD-10-CM | POA: Diagnosis not present

## 2020-07-31 DIAGNOSIS — R54 Age-related physical debility: Secondary | ICD-10-CM | POA: Diagnosis not present

## 2020-07-31 DIAGNOSIS — F419 Anxiety disorder, unspecified: Secondary | ICD-10-CM | POA: Diagnosis not present

## 2020-07-31 DIAGNOSIS — T84038D Mechanical loosening of other internal prosthetic joint, subsequent encounter: Secondary | ICD-10-CM | POA: Diagnosis not present

## 2020-07-31 DIAGNOSIS — Z96659 Presence of unspecified artificial knee joint: Secondary | ICD-10-CM | POA: Diagnosis not present

## 2020-07-31 DIAGNOSIS — Z881 Allergy status to other antibiotic agents status: Secondary | ICD-10-CM | POA: Diagnosis not present

## 2020-08-02 DIAGNOSIS — T84032A Mechanical loosening of internal right knee prosthetic joint, initial encounter: Secondary | ICD-10-CM | POA: Diagnosis not present

## 2020-08-09 ENCOUNTER — Ambulatory Visit: Payer: PPO | Admitting: Podiatry

## 2020-08-09 DIAGNOSIS — M6281 Muscle weakness (generalized): Secondary | ICD-10-CM | POA: Diagnosis not present

## 2020-08-09 DIAGNOSIS — R262 Difficulty in walking, not elsewhere classified: Secondary | ICD-10-CM | POA: Diagnosis not present

## 2020-08-09 DIAGNOSIS — M25661 Stiffness of right knee, not elsewhere classified: Secondary | ICD-10-CM | POA: Diagnosis not present

## 2020-08-11 DIAGNOSIS — M6281 Muscle weakness (generalized): Secondary | ICD-10-CM | POA: Diagnosis not present

## 2020-08-11 DIAGNOSIS — M25661 Stiffness of right knee, not elsewhere classified: Secondary | ICD-10-CM | POA: Diagnosis not present

## 2020-08-11 DIAGNOSIS — R262 Difficulty in walking, not elsewhere classified: Secondary | ICD-10-CM | POA: Diagnosis not present

## 2020-08-12 DIAGNOSIS — J3089 Other allergic rhinitis: Secondary | ICD-10-CM | POA: Diagnosis not present

## 2020-08-16 DIAGNOSIS — R262 Difficulty in walking, not elsewhere classified: Secondary | ICD-10-CM | POA: Diagnosis not present

## 2020-08-16 DIAGNOSIS — M25661 Stiffness of right knee, not elsewhere classified: Secondary | ICD-10-CM | POA: Diagnosis not present

## 2020-08-16 DIAGNOSIS — M6281 Muscle weakness (generalized): Secondary | ICD-10-CM | POA: Diagnosis not present

## 2020-08-18 DIAGNOSIS — M6281 Muscle weakness (generalized): Secondary | ICD-10-CM | POA: Diagnosis not present

## 2020-08-18 DIAGNOSIS — R262 Difficulty in walking, not elsewhere classified: Secondary | ICD-10-CM | POA: Diagnosis not present

## 2020-08-18 DIAGNOSIS — R300 Dysuria: Secondary | ICD-10-CM | POA: Diagnosis not present

## 2020-08-18 DIAGNOSIS — M25661 Stiffness of right knee, not elsewhere classified: Secondary | ICD-10-CM | POA: Diagnosis not present

## 2020-08-20 DIAGNOSIS — M6281 Muscle weakness (generalized): Secondary | ICD-10-CM | POA: Diagnosis not present

## 2020-08-20 DIAGNOSIS — R262 Difficulty in walking, not elsewhere classified: Secondary | ICD-10-CM | POA: Diagnosis not present

## 2020-08-20 DIAGNOSIS — M25661 Stiffness of right knee, not elsewhere classified: Secondary | ICD-10-CM | POA: Diagnosis not present

## 2020-08-23 DIAGNOSIS — M25661 Stiffness of right knee, not elsewhere classified: Secondary | ICD-10-CM | POA: Diagnosis not present

## 2020-08-23 DIAGNOSIS — M6281 Muscle weakness (generalized): Secondary | ICD-10-CM | POA: Diagnosis not present

## 2020-08-23 DIAGNOSIS — R262 Difficulty in walking, not elsewhere classified: Secondary | ICD-10-CM | POA: Diagnosis not present

## 2020-08-24 DIAGNOSIS — Z96651 Presence of right artificial knee joint: Secondary | ICD-10-CM | POA: Diagnosis not present

## 2020-08-24 DIAGNOSIS — Z471 Aftercare following joint replacement surgery: Secondary | ICD-10-CM | POA: Diagnosis not present

## 2020-08-25 DIAGNOSIS — R262 Difficulty in walking, not elsewhere classified: Secondary | ICD-10-CM | POA: Diagnosis not present

## 2020-08-25 DIAGNOSIS — M6281 Muscle weakness (generalized): Secondary | ICD-10-CM | POA: Diagnosis not present

## 2020-08-25 DIAGNOSIS — M25661 Stiffness of right knee, not elsewhere classified: Secondary | ICD-10-CM | POA: Diagnosis not present

## 2020-08-26 DIAGNOSIS — J3089 Other allergic rhinitis: Secondary | ICD-10-CM | POA: Diagnosis not present

## 2020-08-27 DIAGNOSIS — M6281 Muscle weakness (generalized): Secondary | ICD-10-CM | POA: Diagnosis not present

## 2020-08-27 DIAGNOSIS — R262 Difficulty in walking, not elsewhere classified: Secondary | ICD-10-CM | POA: Diagnosis not present

## 2020-08-27 DIAGNOSIS — M25661 Stiffness of right knee, not elsewhere classified: Secondary | ICD-10-CM | POA: Diagnosis not present

## 2020-08-30 ENCOUNTER — Other Ambulatory Visit: Payer: Self-pay

## 2020-08-30 ENCOUNTER — Ambulatory Visit: Payer: PPO | Admitting: Podiatry

## 2020-08-30 DIAGNOSIS — M79674 Pain in right toe(s): Secondary | ICD-10-CM

## 2020-08-30 DIAGNOSIS — L600 Ingrowing nail: Secondary | ICD-10-CM | POA: Diagnosis not present

## 2020-08-30 DIAGNOSIS — M79675 Pain in left toe(s): Secondary | ICD-10-CM

## 2020-08-30 DIAGNOSIS — B351 Tinea unguium: Secondary | ICD-10-CM | POA: Diagnosis not present

## 2020-08-30 DIAGNOSIS — R262 Difficulty in walking, not elsewhere classified: Secondary | ICD-10-CM | POA: Diagnosis not present

## 2020-08-30 DIAGNOSIS — M6281 Muscle weakness (generalized): Secondary | ICD-10-CM | POA: Diagnosis not present

## 2020-08-30 DIAGNOSIS — M25661 Stiffness of right knee, not elsewhere classified: Secondary | ICD-10-CM | POA: Diagnosis not present

## 2020-09-01 DIAGNOSIS — M25661 Stiffness of right knee, not elsewhere classified: Secondary | ICD-10-CM | POA: Diagnosis not present

## 2020-09-01 DIAGNOSIS — R262 Difficulty in walking, not elsewhere classified: Secondary | ICD-10-CM | POA: Diagnosis not present

## 2020-09-01 DIAGNOSIS — M6281 Muscle weakness (generalized): Secondary | ICD-10-CM | POA: Diagnosis not present

## 2020-09-03 DIAGNOSIS — R7301 Impaired fasting glucose: Secondary | ICD-10-CM | POA: Diagnosis not present

## 2020-09-03 DIAGNOSIS — M25661 Stiffness of right knee, not elsewhere classified: Secondary | ICD-10-CM | POA: Diagnosis not present

## 2020-09-03 DIAGNOSIS — E039 Hypothyroidism, unspecified: Secondary | ICD-10-CM | POA: Diagnosis not present

## 2020-09-03 DIAGNOSIS — M6281 Muscle weakness (generalized): Secondary | ICD-10-CM | POA: Diagnosis not present

## 2020-09-03 DIAGNOSIS — R262 Difficulty in walking, not elsewhere classified: Secondary | ICD-10-CM | POA: Diagnosis not present

## 2020-09-06 DIAGNOSIS — Z1152 Encounter for screening for COVID-19: Secondary | ICD-10-CM | POA: Diagnosis not present

## 2020-09-06 DIAGNOSIS — R059 Cough, unspecified: Secondary | ICD-10-CM | POA: Diagnosis not present

## 2020-09-06 DIAGNOSIS — J309 Allergic rhinitis, unspecified: Secondary | ICD-10-CM | POA: Diagnosis not present

## 2020-09-06 DIAGNOSIS — J029 Acute pharyngitis, unspecified: Secondary | ICD-10-CM | POA: Diagnosis not present

## 2020-09-08 DIAGNOSIS — M6281 Muscle weakness (generalized): Secondary | ICD-10-CM | POA: Diagnosis not present

## 2020-09-08 DIAGNOSIS — H43813 Vitreous degeneration, bilateral: Secondary | ICD-10-CM | POA: Diagnosis not present

## 2020-09-08 DIAGNOSIS — R262 Difficulty in walking, not elsewhere classified: Secondary | ICD-10-CM | POA: Diagnosis not present

## 2020-09-08 DIAGNOSIS — H25813 Combined forms of age-related cataract, bilateral: Secondary | ICD-10-CM | POA: Diagnosis not present

## 2020-09-08 DIAGNOSIS — H0102B Squamous blepharitis left eye, upper and lower eyelids: Secondary | ICD-10-CM | POA: Diagnosis not present

## 2020-09-08 DIAGNOSIS — H16223 Keratoconjunctivitis sicca, not specified as Sjogren's, bilateral: Secondary | ICD-10-CM | POA: Diagnosis not present

## 2020-09-08 DIAGNOSIS — M25661 Stiffness of right knee, not elsewhere classified: Secondary | ICD-10-CM | POA: Diagnosis not present

## 2020-09-08 NOTE — Progress Notes (Signed)
Subjective: 69 year old female presents the office today for follow-up evaluation of ingrown toenail to her big toes.  She recently just underwent knee replacement.  Otherwise she has been doing well.  Denies any drainage or pus any swelling or redness to the toenail sites but having difficulty trimming them.  It still become an ongoing issue for her. No acute changes since last appointment, and no other complaints at this time.   Objective: AAO x3, NAD DP/PT pulses palpable bilaterally, CRT less than 3 seconds Mild incurvation to the hallux toenails bilaterally.  All the nails are elongated causing discomfort and mildly hypertrophic, dystrophic with yellow-brown discoloration.  There is no edema, erythema or signs of infection noted today.   No open lesions or pre-ulcerative lesions.  No pain with calf compression, swelling, warmth, erythema  Assessment: Chronic ingrown toenails, symptomatic onychomycosis without signs of infection.  Plan: -All treatment options discussed with the patient including all alternatives, risks, complications.  -Patient debrided nails x10 without any complications or bleeding.  Again discussed partial nail avulsion will start up on this particular after having the knee replacement.  Trula Slade DPM

## 2020-09-13 ENCOUNTER — Other Ambulatory Visit: Payer: Self-pay | Admitting: Endocrinology

## 2020-09-13 DIAGNOSIS — R509 Fever, unspecified: Secondary | ICD-10-CM | POA: Diagnosis not present

## 2020-09-13 DIAGNOSIS — R262 Difficulty in walking, not elsewhere classified: Secondary | ICD-10-CM | POA: Diagnosis not present

## 2020-09-13 DIAGNOSIS — E039 Hypothyroidism, unspecified: Secondary | ICD-10-CM | POA: Diagnosis not present

## 2020-09-13 DIAGNOSIS — E7800 Pure hypercholesterolemia, unspecified: Secondary | ICD-10-CM | POA: Diagnosis not present

## 2020-09-13 DIAGNOSIS — M25661 Stiffness of right knee, not elsewhere classified: Secondary | ICD-10-CM | POA: Diagnosis not present

## 2020-09-13 DIAGNOSIS — R7301 Impaired fasting glucose: Secondary | ICD-10-CM | POA: Diagnosis not present

## 2020-09-13 DIAGNOSIS — E049 Nontoxic goiter, unspecified: Secondary | ICD-10-CM | POA: Diagnosis not present

## 2020-09-13 DIAGNOSIS — J309 Allergic rhinitis, unspecified: Secondary | ICD-10-CM | POA: Diagnosis not present

## 2020-09-13 DIAGNOSIS — M6281 Muscle weakness (generalized): Secondary | ICD-10-CM | POA: Diagnosis not present

## 2020-09-13 DIAGNOSIS — Z1152 Encounter for screening for COVID-19: Secondary | ICD-10-CM | POA: Diagnosis not present

## 2020-09-13 DIAGNOSIS — R059 Cough, unspecified: Secondary | ICD-10-CM | POA: Diagnosis not present

## 2020-09-13 DIAGNOSIS — I10 Essential (primary) hypertension: Secondary | ICD-10-CM | POA: Diagnosis not present

## 2020-09-15 DIAGNOSIS — R262 Difficulty in walking, not elsewhere classified: Secondary | ICD-10-CM | POA: Diagnosis not present

## 2020-09-15 DIAGNOSIS — M6281 Muscle weakness (generalized): Secondary | ICD-10-CM | POA: Diagnosis not present

## 2020-09-15 DIAGNOSIS — M25661 Stiffness of right knee, not elsewhere classified: Secondary | ICD-10-CM | POA: Diagnosis not present

## 2020-09-16 DIAGNOSIS — J3089 Other allergic rhinitis: Secondary | ICD-10-CM | POA: Diagnosis not present

## 2020-09-17 DIAGNOSIS — R262 Difficulty in walking, not elsewhere classified: Secondary | ICD-10-CM | POA: Diagnosis not present

## 2020-09-17 DIAGNOSIS — M25661 Stiffness of right knee, not elsewhere classified: Secondary | ICD-10-CM | POA: Diagnosis not present

## 2020-09-17 DIAGNOSIS — M6281 Muscle weakness (generalized): Secondary | ICD-10-CM | POA: Diagnosis not present

## 2020-09-20 DIAGNOSIS — M6281 Muscle weakness (generalized): Secondary | ICD-10-CM | POA: Diagnosis not present

## 2020-09-20 DIAGNOSIS — M25661 Stiffness of right knee, not elsewhere classified: Secondary | ICD-10-CM | POA: Diagnosis not present

## 2020-09-20 DIAGNOSIS — R262 Difficulty in walking, not elsewhere classified: Secondary | ICD-10-CM | POA: Diagnosis not present

## 2020-09-22 DIAGNOSIS — M25661 Stiffness of right knee, not elsewhere classified: Secondary | ICD-10-CM | POA: Diagnosis not present

## 2020-09-22 DIAGNOSIS — R262 Difficulty in walking, not elsewhere classified: Secondary | ICD-10-CM | POA: Diagnosis not present

## 2020-09-22 DIAGNOSIS — M6281 Muscle weakness (generalized): Secondary | ICD-10-CM | POA: Diagnosis not present

## 2020-09-27 ENCOUNTER — Ambulatory Visit
Admission: RE | Admit: 2020-09-27 | Discharge: 2020-09-27 | Disposition: A | Discharge status: Home / Self Care | Payer: PPO | Source: Ambulatory Visit | Attending: Endocrinology | Admitting: Endocrinology

## 2020-09-27 DIAGNOSIS — M25661 Stiffness of right knee, not elsewhere classified: Secondary | ICD-10-CM | POA: Diagnosis not present

## 2020-09-27 DIAGNOSIS — M6281 Muscle weakness (generalized): Secondary | ICD-10-CM | POA: Diagnosis not present

## 2020-09-27 DIAGNOSIS — R262 Difficulty in walking, not elsewhere classified: Secondary | ICD-10-CM | POA: Diagnosis not present

## 2020-09-27 DIAGNOSIS — E049 Nontoxic goiter, unspecified: Secondary | ICD-10-CM | POA: Diagnosis not present

## 2020-09-29 DIAGNOSIS — M6281 Muscle weakness (generalized): Secondary | ICD-10-CM | POA: Diagnosis not present

## 2020-09-29 DIAGNOSIS — M25661 Stiffness of right knee, not elsewhere classified: Secondary | ICD-10-CM | POA: Diagnosis not present

## 2020-09-29 DIAGNOSIS — J3089 Other allergic rhinitis: Secondary | ICD-10-CM | POA: Diagnosis not present

## 2020-09-29 DIAGNOSIS — R262 Difficulty in walking, not elsewhere classified: Secondary | ICD-10-CM | POA: Diagnosis not present

## 2020-10-04 DIAGNOSIS — R262 Difficulty in walking, not elsewhere classified: Secondary | ICD-10-CM | POA: Diagnosis not present

## 2020-10-04 DIAGNOSIS — M6281 Muscle weakness (generalized): Secondary | ICD-10-CM | POA: Diagnosis not present

## 2020-10-04 DIAGNOSIS — M25661 Stiffness of right knee, not elsewhere classified: Secondary | ICD-10-CM | POA: Diagnosis not present

## 2020-10-06 DIAGNOSIS — M6281 Muscle weakness (generalized): Secondary | ICD-10-CM | POA: Diagnosis not present

## 2020-10-06 DIAGNOSIS — R262 Difficulty in walking, not elsewhere classified: Secondary | ICD-10-CM | POA: Diagnosis not present

## 2020-10-06 DIAGNOSIS — M25661 Stiffness of right knee, not elsewhere classified: Secondary | ICD-10-CM | POA: Diagnosis not present

## 2020-10-11 DIAGNOSIS — M6281 Muscle weakness (generalized): Secondary | ICD-10-CM | POA: Diagnosis not present

## 2020-10-11 DIAGNOSIS — M25661 Stiffness of right knee, not elsewhere classified: Secondary | ICD-10-CM | POA: Diagnosis not present

## 2020-10-11 DIAGNOSIS — R262 Difficulty in walking, not elsewhere classified: Secondary | ICD-10-CM | POA: Diagnosis not present

## 2020-10-13 DIAGNOSIS — R262 Difficulty in walking, not elsewhere classified: Secondary | ICD-10-CM | POA: Diagnosis not present

## 2020-10-13 DIAGNOSIS — M25661 Stiffness of right knee, not elsewhere classified: Secondary | ICD-10-CM | POA: Diagnosis not present

## 2020-10-13 DIAGNOSIS — M6281 Muscle weakness (generalized): Secondary | ICD-10-CM | POA: Diagnosis not present

## 2020-10-14 DIAGNOSIS — J3089 Other allergic rhinitis: Secondary | ICD-10-CM | POA: Diagnosis not present

## 2020-10-18 DIAGNOSIS — M25661 Stiffness of right knee, not elsewhere classified: Secondary | ICD-10-CM | POA: Diagnosis not present

## 2020-10-18 DIAGNOSIS — R262 Difficulty in walking, not elsewhere classified: Secondary | ICD-10-CM | POA: Diagnosis not present

## 2020-10-18 DIAGNOSIS — M6281 Muscle weakness (generalized): Secondary | ICD-10-CM | POA: Diagnosis not present

## 2020-10-20 DIAGNOSIS — M6281 Muscle weakness (generalized): Secondary | ICD-10-CM | POA: Diagnosis not present

## 2020-10-20 DIAGNOSIS — R262 Difficulty in walking, not elsewhere classified: Secondary | ICD-10-CM | POA: Diagnosis not present

## 2020-10-20 DIAGNOSIS — M25661 Stiffness of right knee, not elsewhere classified: Secondary | ICD-10-CM | POA: Diagnosis not present

## 2020-10-22 DIAGNOSIS — M6281 Muscle weakness (generalized): Secondary | ICD-10-CM | POA: Diagnosis not present

## 2020-10-22 DIAGNOSIS — M25661 Stiffness of right knee, not elsewhere classified: Secondary | ICD-10-CM | POA: Diagnosis not present

## 2020-10-22 DIAGNOSIS — R262 Difficulty in walking, not elsewhere classified: Secondary | ICD-10-CM | POA: Diagnosis not present

## 2020-10-25 DIAGNOSIS — M25661 Stiffness of right knee, not elsewhere classified: Secondary | ICD-10-CM | POA: Diagnosis not present

## 2020-10-25 DIAGNOSIS — R262 Difficulty in walking, not elsewhere classified: Secondary | ICD-10-CM | POA: Diagnosis not present

## 2020-10-25 DIAGNOSIS — M6281 Muscle weakness (generalized): Secondary | ICD-10-CM | POA: Diagnosis not present

## 2020-10-27 DIAGNOSIS — J301 Allergic rhinitis due to pollen: Secondary | ICD-10-CM | POA: Diagnosis not present

## 2020-10-27 DIAGNOSIS — J3089 Other allergic rhinitis: Secondary | ICD-10-CM | POA: Diagnosis not present

## 2020-10-28 DIAGNOSIS — M25661 Stiffness of right knee, not elsewhere classified: Secondary | ICD-10-CM | POA: Diagnosis not present

## 2020-10-28 DIAGNOSIS — R262 Difficulty in walking, not elsewhere classified: Secondary | ICD-10-CM | POA: Diagnosis not present

## 2020-10-28 DIAGNOSIS — M6281 Muscle weakness (generalized): Secondary | ICD-10-CM | POA: Diagnosis not present

## 2020-11-01 ENCOUNTER — Ambulatory Visit: Payer: PPO | Admitting: Podiatry

## 2020-11-09 DIAGNOSIS — J3089 Other allergic rhinitis: Secondary | ICD-10-CM | POA: Diagnosis not present

## 2020-11-11 ENCOUNTER — Ambulatory Visit: Payer: PPO | Admitting: Podiatry

## 2020-11-11 ENCOUNTER — Other Ambulatory Visit: Payer: Self-pay

## 2020-11-11 DIAGNOSIS — B351 Tinea unguium: Secondary | ICD-10-CM | POA: Diagnosis not present

## 2020-11-11 DIAGNOSIS — L600 Ingrowing nail: Secondary | ICD-10-CM | POA: Diagnosis not present

## 2020-11-11 DIAGNOSIS — M79674 Pain in right toe(s): Secondary | ICD-10-CM

## 2020-11-11 DIAGNOSIS — M79675 Pain in left toe(s): Secondary | ICD-10-CM

## 2020-11-12 DIAGNOSIS — E039 Hypothyroidism, unspecified: Secondary | ICD-10-CM | POA: Diagnosis not present

## 2020-11-12 DIAGNOSIS — U071 COVID-19: Secondary | ICD-10-CM | POA: Diagnosis not present

## 2020-11-12 DIAGNOSIS — G610 Guillain-Barre syndrome: Secondary | ICD-10-CM | POA: Diagnosis not present

## 2020-11-12 DIAGNOSIS — G629 Polyneuropathy, unspecified: Secondary | ICD-10-CM | POA: Diagnosis not present

## 2020-11-12 DIAGNOSIS — I10 Essential (primary) hypertension: Secondary | ICD-10-CM | POA: Diagnosis not present

## 2020-11-12 DIAGNOSIS — R0981 Nasal congestion: Secondary | ICD-10-CM | POA: Diagnosis not present

## 2020-11-12 DIAGNOSIS — Z1152 Encounter for screening for COVID-19: Secondary | ICD-10-CM | POA: Diagnosis not present

## 2020-11-12 DIAGNOSIS — E785 Hyperlipidemia, unspecified: Secondary | ICD-10-CM | POA: Diagnosis not present

## 2020-11-13 ENCOUNTER — Telehealth: Payer: Self-pay | Admitting: Infectious Diseases

## 2020-11-13 ENCOUNTER — Other Ambulatory Visit: Payer: Self-pay | Admitting: Infectious Diseases

## 2020-11-13 ENCOUNTER — Ambulatory Visit (HOSPITAL_COMMUNITY)
Admission: RE | Admit: 2020-11-13 | Discharge: 2020-11-13 | Disposition: A | Discharge status: Home / Self Care | Payer: PPO | Source: Ambulatory Visit | Attending: Pulmonary Disease | Admitting: Pulmonary Disease

## 2020-11-13 DIAGNOSIS — U071 COVID-19: Secondary | ICD-10-CM | POA: Diagnosis not present

## 2020-11-13 MED ORDER — SODIUM CHLORIDE 0.9 % IV SOLN: INTRAVENOUS | Status: DC | PRN

## 2020-11-13 MED ORDER — SODIUM CHLORIDE 0.9 % IV SOLN
100.0000 mg | Freq: Once | INTRAVENOUS | Status: AC
  Administered 2020-11-13: 100 mg via INTRAVENOUS

## 2020-11-13 MED ORDER — FAMOTIDINE IN NACL 20-0.9 MG/50ML-% IV SOLN: 20.0000 mg | Freq: Once | INTRAVENOUS | Status: DC | PRN

## 2020-11-13 MED ORDER — ALBUTEROL SULFATE HFA 108 (90 BASE) MCG/ACT IN AERS: 2.0000 | INHALATION_SPRAY | Freq: Once | RESPIRATORY_TRACT | Status: DC | PRN

## 2020-11-13 MED ORDER — METHYLPREDNISOLONE SODIUM SUCC 125 MG IJ SOLR: 125.0000 mg | Freq: Once | INTRAMUSCULAR | Status: DC | PRN

## 2020-11-13 MED ORDER — EPINEPHRINE 0.3 MG/0.3ML IJ SOAJ: 0.3000 mg | Freq: Once | INTRAMUSCULAR | Status: DC | PRN

## 2020-11-13 MED ORDER — DIPHENHYDRAMINE HCL 50 MG/ML IJ SOLN: 50.0000 mg | Freq: Once | INTRAMUSCULAR | Status: DC | PRN

## 2020-11-13 NOTE — Progress Notes (Signed)
I connected by phone with Rod Can on 11/13/2020 at 1:52 PM to discuss the potential use of the antiviral REMDESIVIR for acute COVID-19 viral infection in non-hospitalized patients.   This patient is a 70 y.o. female that meets the FDA criteria for Emergency Use Authorization of [place].  Has a (+) direct SARS-CoV-2 viral test result  Has mild or moderate symptoms related to COVID-19  Is within 7 days of symptom onset  Has at least one of the high risk factor(s) for progression to severe COVID-19 and/or hospitalization as defined in NIH Guidelines and EUA.   Specific high risk criteria : Older age (>/= 71 yo) and Cardiovascular disease or hypertension   I have spoken and communicated the following to the patient or parent/caregiver regarding COVID-19 IV Antiviral treatment:  1. FDA has authorized the emergency use for the treatment of mild to moderate COVID-19 in adults and pediatric patients with positive results of SARS-CoV-2 testing who are 52 years of age and older weighing at least 40 kg, and who are at high risk for progressing to severe COVID-19 and/or hospitalization.  2. The significant known and potential risks and benefits in receiving REMDESIVIR in accordance with current NIH treatment guidelines.   3. Information on available alternative treatments and the risks and benefits of those alternatives, including clinical trials that may be accessible to the patient.   4. Patients treated with antiviral therapy should continue to isolate and use infection control measures (e.g., wear mask, isolate, social distance, avoid sharing personal items, clean and disinfect "high touch" surfaces, and frequent handwashing) according to CDC guidelines.   5. The patient or parent/caregiver has the option to accept or refuse REMDESIVIR therapy and has had the opportunity to have all questions addressed prior to consenting for treatment.    After reviewing this information with the  patient, he/she has decided to proceed with the 3 day course of treatment.   Janene Madeira, NP 11/13/2020  1:52 PM

## 2020-11-13 NOTE — Telephone Encounter (Signed)
Called to discuss with patient about COVID-19 symptoms and the use of one of the available treatments for those with mild to moderate Covid symptoms and at a high risk of hospitalization.  Pt appears to qualify for outpatient treatment due to co-morbid conditions and/or a member of an at-risk group in accordance with the FDA Emergency Use Authorization.    Symptom onset: 11/07/2020 Vaccinated: yes, completed February  Booster? Booster in August  Immunocompromised? no Qualifiers: age  She feels like today is her "rough day" re: fatigue and aches. She would like to talk over remdesivir with her husband and let me know about an appointment today at 2:30   Brianna Madeira, NP

## 2020-11-13 NOTE — Discharge Instructions (Signed)
10 Things You Can Do to Manage Your COVID-19 Symptoms at Home °If you have possible or confirmed COVID-19: °1. Stay home except to get medical care. °2. Monitor your symptoms carefully. If your symptoms get worse, call your healthcare provider immediately. °3. Get rest and stay hydrated. °4. If you have a medical appointment, call the healthcare provider ahead of time and tell them that you have or may have COVID-19. °5. For medical emergencies, call 911 and notify the dispatch personnel that you have or may have COVID-19. °6. Cover your cough and sneezes with a tissue or use the inside of your elbow. °7. Wash your hands often with soap and water for at least 20 seconds or clean your hands with an alcohol-based hand sanitizer that contains at least 60% alcohol. °8. As much as possible, stay in a specific room and away from other people in your home. Also, you should use a separate bathroom, if available. If you need to be around other people in or outside of the home, wear a mask. °9. Avoid sharing personal items with other people in your household, like dishes, towels, and bedding. °10. Clean all surfaces that are touched often, like counters, tabletops, and doorknobs. Use household cleaning sprays or wipes according to the label instructions. °cdc.gov/coronavirus °04/30/2020 °This information is not intended to replace advice given to you by your health care provider. Make sure you discuss any questions you have with your health care provider. °Document Revised: 08/16/2020 Document Reviewed: 08/16/2020 °Elsevier Patient Education © 2021 Elsevier Inc. °If you have any questions or concerns after the infusion please call the Advanced Practice Provider on call at [phone]. This number is ONLY intended for your use regarding questions or concerns about the infusion post-treatment side-effects.  Please do not provide this number to others for use. For return to work notes please contact your primary care provider.   ° °If someone you know is interested in receiving treatment please have them contact their MD for a referral or visit [email or web address] ° ° ° °

## 2020-11-13 NOTE — Progress Notes (Signed)
Patient reviewed Fact Sheet for Patients, Parents, and Caregivers for Emergency Use Authorization (EUA) of Remdesivir for the Treatment of Coronavirus. Patient also reviewed and is agreeable to the estimated cost of treatment. Patient is agreeable to proceed.   

## 2020-11-13 NOTE — Progress Notes (Signed)
  Diagnosis: COVID-19  Physician: Patrick Wright, MD  Procedure: [facility] Med: remdesivir infusion - Provided patient with remdesivir fact sheet for patients, parents and caregivers prior to infusion.  Complications: No immediate complications noted.  Discharge: Discharged home   Ellyce Lafevers N Krisinda Giovanni 11/13/2020  

## 2020-11-14 NOTE — Progress Notes (Signed)
Subjective: 70 year old female presents the office today for follow-up evaluation of ingrown toenail to her big toes nails becoming thickened discolored.  She would like to help with her big toenails trimmed nails are causing discomfort.  Denies any redness or drainage or any swelling to the toenail sites.  She has no other concerns today.  Objective: AAO x3, NAD DP/PT pulses palpable bilaterally, CRT less than 3 seconds Mild incurvation to the hallux toenails bilaterally and the hallux nails are mildly hypertrophic, dystrophic.  No edema, erythema and signs of infection noted today.  No open lesions or pre-ulcerative lesions.  No pain with calf compression, swelling, warmth, erythema  Assessment: Chronic ingrown toenails, symptomatic onychomycosis without signs of infection.  Plan: -All treatment options discussed with the patient including all alternatives, risks, complications.  -Patient debrided nails x2 without any complications or bleeding.  Again discussed partial nail avulsion but states that she has other medical issues she needs to take care of prior to this.  Trula Slade DPM

## 2020-11-15 ENCOUNTER — Ambulatory Visit (HOSPITAL_COMMUNITY)
Admission: RE | Admit: 2020-11-15 | Discharge: 2020-11-15 | Disposition: A | Discharge status: Home / Self Care | Payer: PPO | Source: Ambulatory Visit | Attending: Pulmonary Disease | Admitting: Pulmonary Disease

## 2020-11-15 DIAGNOSIS — U071 COVID-19: Secondary | ICD-10-CM | POA: Insufficient documentation

## 2020-11-15 DIAGNOSIS — J1289 Other viral pneumonia: Secondary | ICD-10-CM | POA: Insufficient documentation

## 2020-11-15 MED ORDER — FAMOTIDINE IN NACL 20-0.9 MG/50ML-% IV SOLN: 20.0000 mg | Freq: Once | INTRAVENOUS | Status: DC | PRN

## 2020-11-15 MED ORDER — EPINEPHRINE 0.3 MG/0.3ML IJ SOAJ: 0.3000 mg | Freq: Once | INTRAMUSCULAR | Status: DC | PRN

## 2020-11-15 MED ORDER — ALBUTEROL SULFATE HFA 108 (90 BASE) MCG/ACT IN AERS: 2.0000 | INHALATION_SPRAY | Freq: Once | RESPIRATORY_TRACT | Status: DC | PRN

## 2020-11-15 MED ORDER — SODIUM CHLORIDE 0.9 % IV SOLN
100.0000 mg | Freq: Once | INTRAVENOUS | Status: AC
  Administered 2020-11-15: 100 mg via INTRAVENOUS

## 2020-11-15 MED ORDER — DIPHENHYDRAMINE HCL 50 MG/ML IJ SOLN: 50.0000 mg | Freq: Once | INTRAMUSCULAR | Status: DC | PRN

## 2020-11-15 MED ORDER — METHYLPREDNISOLONE SODIUM SUCC 125 MG IJ SOLR: 125.0000 mg | Freq: Once | INTRAMUSCULAR | Status: DC | PRN

## 2020-11-15 MED ORDER — SODIUM CHLORIDE 0.9 % IV SOLN: INTRAVENOUS | Status: DC | PRN

## 2020-11-15 NOTE — Discharge Instructions (Signed)
10 Things You Can Do to Manage Your COVID-19 Symptoms at Home °If you have possible or confirmed COVID-19: °1. Stay home except to get medical care. °2. Monitor your symptoms carefully. If your symptoms get worse, call your healthcare provider immediately. °3. Get rest and stay hydrated. °4. If you have a medical appointment, call the healthcare provider ahead of time and tell them that you have or may have COVID-19. °5. For medical emergencies, call 911 and notify the dispatch personnel that you have or may have COVID-19. °6. Cover your cough and sneezes with a tissue or use the inside of your elbow. °7. Wash your hands often with soap and water for at least 20 seconds or clean your hands with an alcohol-based hand sanitizer that contains at least 60% alcohol. °8. As much as possible, stay in a specific room and away from other people in your home. Also, you should use a separate bathroom, if available. If you need to be around other people in or outside of the home, wear a mask. °9. Avoid sharing personal items with other people in your household, like dishes, towels, and bedding. °10. Clean all surfaces that are touched often, like counters, tabletops, and doorknobs. Use household cleaning sprays or wipes according to the label instructions. °cdc.gov/coronavirus °04/30/2020 °This information is not intended to replace advice given to you by your health care provider. Make sure you discuss any questions you have with your health care provider. °Document Revised: 08/16/2020 Document Reviewed: 08/16/2020 °Elsevier Patient Education © 2021 Elsevier Inc. °If you have any questions or concerns after the infusion please call the Advanced Practice Provider on call at [phone]. This number is ONLY intended for your use regarding questions or concerns about the infusion post-treatment side-effects.  Please do not provide this number to others for use. For return to work notes please contact your primary care provider.   ° °If someone you know is interested in receiving treatment please have them contact their MD for a referral or visit [email or web address] ° ° ° °

## 2020-11-15 NOTE — Progress Notes (Signed)
  Diagnosis: COVID-19  Physician: Dr. Asencion Noble  Procedure: [facility]fusion [facility] Med: remdesivir infusion - Provided patient with remdesivir fact sheet for patients, parents and caregivers prior to infusion.  Complications: No immediate complications noted.  Discharge: Discharged home   Brianna Gates 11/15/2020

## 2020-11-15 NOTE — Progress Notes (Signed)
Patient reviewed Fact Sheet for Patients, Parents, and Caregivers for Emergency Use Authorization (EUA) of sotrovimab for the Treatment of Coronavirus. Patient also reviewed and is agreeable to the estimated cost of treatment. Patient is agreeable to proceed.   

## 2020-11-16 ENCOUNTER — Ambulatory Visit (HOSPITAL_COMMUNITY): Payer: PPO

## 2020-11-16 ENCOUNTER — Ambulatory Visit (HOSPITAL_COMMUNITY)
Admission: RE | Admit: 2020-11-16 | Discharge: 2020-11-16 | Disposition: A | Discharge status: Home / Self Care | Payer: PPO | Source: Ambulatory Visit | Attending: Pulmonary Disease | Admitting: Pulmonary Disease

## 2020-11-16 DIAGNOSIS — U071 COVID-19: Secondary | ICD-10-CM | POA: Diagnosis not present

## 2020-11-16 MED ORDER — DIPHENHYDRAMINE HCL 50 MG/ML IJ SOLN: 50.0000 mg | Freq: Once | INTRAMUSCULAR | Status: DC | PRN

## 2020-11-16 MED ORDER — FAMOTIDINE IN NACL 20-0.9 MG/50ML-% IV SOLN: 20.0000 mg | Freq: Once | INTRAVENOUS | Status: DC | PRN

## 2020-11-16 MED ORDER — EPINEPHRINE 0.3 MG/0.3ML IJ SOAJ: 0.3000 mg | Freq: Once | INTRAMUSCULAR | Status: DC | PRN

## 2020-11-16 MED ORDER — SODIUM CHLORIDE 0.9 % IV SOLN
100.0000 mg | Freq: Once | INTRAVENOUS | Status: AC
  Administered 2020-11-16: 100 mg via INTRAVENOUS

## 2020-11-16 MED ORDER — SODIUM CHLORIDE 0.9 % IV SOLN: INTRAVENOUS | Status: DC | PRN

## 2020-11-16 MED ORDER — ALBUTEROL SULFATE HFA 108 (90 BASE) MCG/ACT IN AERS: 2.0000 | INHALATION_SPRAY | Freq: Once | RESPIRATORY_TRACT | Status: DC | PRN

## 2020-11-16 MED ORDER — METHYLPREDNISOLONE SODIUM SUCC 125 MG IJ SOLR: 125.0000 mg | Freq: Once | INTRAMUSCULAR | Status: DC | PRN

## 2020-11-16 NOTE — Progress Notes (Signed)
  Diagnosis: COVID-19  Physician: Dr. Asencion Noble  Procedure: [facility]fusion [facility] Med: remdesivir infusion - Provided patient with remdesivir fact sheet for patients, parents and caregivers prior to infusion.  Complications: No immediate complications noted.  Discharge: Discharged home   [name] 11/16/2020

## 2020-11-16 NOTE — Progress Notes (Signed)
Patient reviewed Fact Sheet for Patients, Parents, and Caregivers for Emergency Use Authorization (EUA) of remdesivir for the Treatment of Coronavirus. Patient also reviewed and is agreeable to the estimated cost of treatment. Patient is agreeable to proceed.    

## 2020-11-16 NOTE — Discharge Instructions (Signed)
10 Things You Can Do to Manage Your COVID-19 Symptoms at Home °If you have possible or confirmed COVID-19: °1. Stay home except to get medical care. °2. Monitor your symptoms carefully. If your symptoms get worse, call your healthcare provider immediately. °3. Get rest and stay hydrated. °4. If you have a medical appointment, call the healthcare provider ahead of time and tell them that you have or may have COVID-19. °5. For medical emergencies, call 911 and notify the dispatch personnel that you have or may have COVID-19. °6. Cover your cough and sneezes with a tissue or use the inside of your elbow. °7. Wash your hands often with soap and water for at least 20 seconds or clean your hands with an alcohol-based hand sanitizer that contains at least 60% alcohol. °8. As much as possible, stay in a specific room and away from other people in your home. Also, you should use a separate bathroom, if available. If you need to be around other people in or outside of the home, wear a mask. °9. Avoid sharing personal items with other people in your household, like dishes, towels, and bedding. °10. Clean all surfaces that are touched often, like counters, tabletops, and doorknobs. Use household cleaning sprays or wipes according to the label instructions. °cdc.gov/coronavirus °04/30/2020 °This information is not intended to replace advice given to you by your health care provider. Make sure you discuss any questions you have with your health care provider. °Document Revised: 08/16/2020 Document Reviewed: 08/16/2020 °Elsevier Patient Education © 2021 Elsevier Inc. °If you have any questions or concerns after the infusion please call the Advanced Practice Provider on call at [phone]. This number is ONLY intended for your use regarding questions or concerns about the infusion post-treatment side-effects.  Please do not provide this number to others for use. For return to work notes please contact your primary care provider.   ° °If someone you know is interested in receiving treatment please have them contact their MD for a referral or visit [email or web address] ° ° ° °

## 2020-11-17 ENCOUNTER — Ambulatory Visit: Payer: PPO | Admitting: Neurology

## 2020-11-30 DIAGNOSIS — D2239 Melanocytic nevi of other parts of face: Secondary | ICD-10-CM | POA: Diagnosis not present

## 2020-11-30 DIAGNOSIS — J3089 Other allergic rhinitis: Secondary | ICD-10-CM | POA: Diagnosis not present

## 2020-11-30 DIAGNOSIS — L821 Other seborrheic keratosis: Secondary | ICD-10-CM | POA: Diagnosis not present

## 2020-11-30 DIAGNOSIS — L814 Other melanin hyperpigmentation: Secondary | ICD-10-CM | POA: Diagnosis not present

## 2020-11-30 DIAGNOSIS — L853 Xerosis cutis: Secondary | ICD-10-CM | POA: Diagnosis not present

## 2020-11-30 DIAGNOSIS — L570 Actinic keratosis: Secondary | ICD-10-CM | POA: Diagnosis not present

## 2020-11-30 DIAGNOSIS — L218 Other seborrheic dermatitis: Secondary | ICD-10-CM | POA: Diagnosis not present

## 2020-12-01 DIAGNOSIS — J3089 Other allergic rhinitis: Secondary | ICD-10-CM | POA: Diagnosis not present

## 2020-12-10 ENCOUNTER — Other Ambulatory Visit: Payer: Self-pay | Admitting: Neurology

## 2020-12-13 DIAGNOSIS — J3089 Other allergic rhinitis: Secondary | ICD-10-CM | POA: Diagnosis not present

## 2020-12-20 DIAGNOSIS — H0102A Squamous blepharitis right eye, upper and lower eyelids: Secondary | ICD-10-CM | POA: Diagnosis not present

## 2020-12-20 DIAGNOSIS — H0102B Squamous blepharitis left eye, upper and lower eyelids: Secondary | ICD-10-CM | POA: Diagnosis not present

## 2020-12-20 DIAGNOSIS — H16223 Keratoconjunctivitis sicca, not specified as Sjogren's, bilateral: Secondary | ICD-10-CM | POA: Diagnosis not present

## 2020-12-20 DIAGNOSIS — H2513 Age-related nuclear cataract, bilateral: Secondary | ICD-10-CM | POA: Diagnosis not present

## 2020-12-20 DIAGNOSIS — H2512 Age-related nuclear cataract, left eye: Secondary | ICD-10-CM | POA: Diagnosis not present

## 2020-12-20 DIAGNOSIS — H25813 Combined forms of age-related cataract, bilateral: Secondary | ICD-10-CM | POA: Diagnosis not present

## 2020-12-23 DIAGNOSIS — J301 Allergic rhinitis due to pollen: Secondary | ICD-10-CM | POA: Diagnosis not present

## 2020-12-23 DIAGNOSIS — J3089 Other allergic rhinitis: Secondary | ICD-10-CM | POA: Diagnosis not present

## 2020-12-27 ENCOUNTER — Observation Stay (HOSPITAL_COMMUNITY): Payer: PPO

## 2020-12-27 ENCOUNTER — Observation Stay (HOSPITAL_COMMUNITY)
Admission: EM | Admit: 2020-12-27 | Discharge: 2020-12-28 | Disposition: A | Discharge status: Home / Self Care | Payer: PPO | Attending: Internal Medicine | Admitting: Internal Medicine

## 2020-12-27 DIAGNOSIS — I10 Essential (primary) hypertension: Secondary | ICD-10-CM | POA: Diagnosis not present

## 2020-12-27 DIAGNOSIS — E039 Hypothyroidism, unspecified: Secondary | ICD-10-CM | POA: Diagnosis present

## 2020-12-27 DIAGNOSIS — J45909 Unspecified asthma, uncomplicated: Secondary | ICD-10-CM | POA: Diagnosis not present

## 2020-12-27 DIAGNOSIS — I9589 Other hypotension: Secondary | ICD-10-CM | POA: Diagnosis not present

## 2020-12-27 DIAGNOSIS — Z20822 Contact with and (suspected) exposure to covid-19: Secondary | ICD-10-CM | POA: Diagnosis not present

## 2020-12-27 DIAGNOSIS — Z8616 Personal history of COVID-19: Secondary | ICD-10-CM | POA: Diagnosis not present

## 2020-12-27 DIAGNOSIS — R001 Bradycardia, unspecified: Secondary | ICD-10-CM | POA: Diagnosis not present

## 2020-12-27 DIAGNOSIS — R4020 Unspecified coma: Secondary | ICD-10-CM | POA: Diagnosis not present

## 2020-12-27 DIAGNOSIS — Z79899 Other long term (current) drug therapy: Secondary | ICD-10-CM | POA: Insufficient documentation

## 2020-12-27 DIAGNOSIS — I959 Hypotension, unspecified: Secondary | ICD-10-CM | POA: Diagnosis not present

## 2020-12-27 DIAGNOSIS — R55 Syncope and collapse: Secondary | ICD-10-CM | POA: Diagnosis not present

## 2020-12-27 DIAGNOSIS — R9082 White matter disease, unspecified: Secondary | ICD-10-CM | POA: Diagnosis not present

## 2020-12-27 LAB — TROPONIN I (HIGH SENSITIVITY)
Troponin I (High Sensitivity): <2 ng/L (ref <18)
Troponin I (High Sensitivity): 3 ng/L (ref <18)

## 2020-12-27 LAB — URINALYSIS, ROUTINE W REFLEX MICROSCOPIC
Bacteria, UA: NONE SEEN
Bilirubin Urine: NEGATIVE
Glucose, UA: NEGATIVE mg/dL
Ketones, ur: 20 mg/dL — AB
Leukocytes,Ua: NEGATIVE
Nitrite: NEGATIVE
Protein, ur: NEGATIVE mg/dL
Specific Gravity, Urine: 1.008 (ref 1.005–1.030)
pH: 7 (ref 5.0–8.0)

## 2020-12-27 LAB — CBC WITH DIFFERENTIAL/PLATELET
Abs Immature Granulocytes: 0.01 10*3/uL (ref 0.00–0.07)
Basophils Absolute: 0.1 10*3/uL (ref 0.0–0.1)
Basophils Relative: 1 %
Eosinophils Absolute: 0.1 10*3/uL (ref 0.0–0.5)
Eosinophils Relative: 1 %
HCT: 44 % (ref 36.0–46.0)
Hemoglobin: 14.3 g/dL (ref 12.0–15.0)
Immature Granulocytes: 0 %
Lymphocytes Relative: 39 %
Lymphs Abs: 2.6 10*3/uL (ref 0.7–4.0)
MCH: 32.2 pg (ref 26.0–34.0)
MCHC: 32.5 g/dL (ref 30.0–36.0)
MCV: 99.1 fL (ref 80.0–100.0)
Monocytes Absolute: 0.4 10*3/uL (ref 0.1–1.0)
Monocytes Relative: 6 %
Neutro Abs: 3.5 10*3/uL (ref 1.7–7.7)
Neutrophils Relative %: 53 %
Platelets: 164 10*3/uL (ref 150–400)
RBC: 4.44 MIL/uL (ref 3.87–5.11)
RDW: 14.2 % (ref 11.5–15.5)
WBC: 6.6 10*3/uL (ref 4.0–10.5)
nRBC: 0 % (ref 0.0–0.2)

## 2020-12-27 LAB — I-STAT CHEM 8, ED
BUN: 23 mg/dL (ref 8–23)
Calcium, Ion: 1.11 mmol/L — ABNORMAL LOW (ref 1.15–1.40)
Chloride: 103 mmol/L (ref 98–111)
Creatinine, Ser: 0.6 mg/dL (ref 0.44–1.00)
Glucose, Bld: 94 mg/dL (ref 70–99)
HCT: 43 % (ref 36.0–46.0)
Hemoglobin: 14.6 g/dL (ref 12.0–15.0)
Potassium: 3.9 mmol/L (ref 3.5–5.1)
Sodium: 138 mmol/L (ref 135–145)
TCO2: 27 mmol/L (ref 22–32)

## 2020-12-27 LAB — CBG MONITORING, ED: Glucose-Capillary: 95 mg/dL (ref 70–99)

## 2020-12-27 LAB — BASIC METABOLIC PANEL
Anion gap: 8 (ref 5–15)
BUN: 17 mg/dL (ref 8–23)
CO2: 26 mmol/L (ref 22–32)
Calcium: 9.4 mg/dL (ref 8.9–10.3)
Chloride: 102 mmol/L (ref 98–111)
Creatinine, Ser: 0.72 mg/dL (ref 0.44–1.00)
GFR, Estimated: >60 mL/min (ref >60)
Glucose, Bld: 95 mg/dL (ref 70–99)
Potassium: 3.5 mmol/L (ref 3.5–5.1)
Sodium: 136 mmol/L (ref 135–145)

## 2020-12-27 LAB — RAPID URINE DRUG SCREEN, HOSP PERFORMED
Amphetamines: NOT DETECTED
Barbiturates: NOT DETECTED
Benzodiazepines: NOT DETECTED
Cocaine: NOT DETECTED
Opiates: NOT DETECTED
Tetrahydrocannabinol: NOT DETECTED

## 2020-12-27 LAB — MAGNESIUM: Magnesium: 2 mg/dL (ref 1.7–2.4)

## 2020-12-27 LAB — TSH: TSH: 1.068 u[IU]/mL (ref 0.350–4.500)

## 2020-12-27 LAB — SARS CORONAVIRUS 2 (TAT 6-24 HRS): SARS Coronavirus 2: NEGATIVE

## 2020-12-27 MED ORDER — ONDANSETRON HCL 4 MG/2ML IJ SOLN
4.0000 mg | Freq: Once | INTRAMUSCULAR | Status: AC
  Administered 2020-12-27: 4 mg via INTRAVENOUS
  Filled 2020-12-27: qty 2

## 2020-12-27 MED ORDER — LEVOTHYROXINE SODIUM 50 MCG PO TABS
50.0000 ug | ORAL_TABLET | Freq: Every day | ORAL | Status: DC
  Administered 2020-12-28: 50 ug via ORAL
  Filled 2020-12-27: qty 1

## 2020-12-27 MED ORDER — ROSUVASTATIN CALCIUM 5 MG PO TABS
5.0000 mg | ORAL_TABLET | ORAL | Status: DC
  Administered 2020-12-27: 5 mg via ORAL
  Filled 2020-12-27: qty 1

## 2020-12-27 MED ORDER — GABAPENTIN 300 MG PO CAPS
300.0000 mg | ORAL_CAPSULE | Freq: Every day | ORAL | Status: DC
  Administered 2020-12-27: 300 mg via ORAL
  Filled 2020-12-27: qty 1

## 2020-12-27 MED ORDER — SODIUM CHLORIDE 0.9% FLUSH
3.0000 mL | Freq: Two times a day (BID) | INTRAVENOUS | Status: DC
  Administered 2020-12-28: 3 mL via INTRAVENOUS

## 2020-12-27 MED ORDER — SODIUM CHLORIDE 0.9 % IV BOLUS
1000.0000 mL | Freq: Once | INTRAVENOUS | Status: AC
  Administered 2020-12-27: 1000 mL via INTRAVENOUS

## 2020-12-27 MED ORDER — SODIUM CHLORIDE 0.9 % IV SOLN: Freq: Once | INTRAVENOUS | Status: AC

## 2020-12-27 MED ORDER — FLUTICASONE PROPIONATE 50 MCG/ACT NA SUSP
1.0000 | Freq: Every day | NASAL | Status: DC | PRN
  Filled 2020-12-27: qty 16

## 2020-12-27 MED ORDER — AMLODIPINE BESYLATE 5 MG PO TABS: 5.0000 mg | ORAL_TABLET | Freq: Every day | ORAL | Status: DC

## 2020-12-27 MED ORDER — ACETAMINOPHEN 325 MG PO TABS: 650.0000 mg | ORAL_TABLET | Freq: Four times a day (QID) | ORAL | Status: DC | PRN

## 2020-12-27 MED ORDER — TETRAHYDROZOLINE HCL 0.05 % OP SOLN
1.0000 [drp] | Freq: Every evening | OPHTHALMIC | Status: DC | PRN
  Filled 2020-12-27: qty 15

## 2020-12-27 MED ORDER — ALBUTEROL SULFATE HFA 108 (90 BASE) MCG/ACT IN AERS
2.0000 | INHALATION_SPRAY | Freq: Four times a day (QID) | RESPIRATORY_TRACT | Status: DC | PRN
  Filled 2020-12-27: qty 6.7

## 2020-12-27 MED ORDER — ACETAMINOPHEN 650 MG RE SUPP: 650.0000 mg | Freq: Four times a day (QID) | RECTAL | Status: DC | PRN

## 2020-12-27 MED ORDER — MONTELUKAST SODIUM 10 MG PO TABS
10.0000 mg | ORAL_TABLET | Freq: Every day | ORAL | Status: DC | PRN
  Filled 2020-12-27: qty 1

## 2020-12-27 MED ORDER — DULOXETINE HCL 20 MG PO CPEP
20.0000 mg | ORAL_CAPSULE | Freq: Every day | ORAL | Status: DC
  Administered 2020-12-28: 20 mg via ORAL
  Filled 2020-12-27: qty 1

## 2020-12-27 MED ORDER — ENOXAPARIN SODIUM 40 MG/0.4ML ~~LOC~~ SOLN
40.0000 mg | SUBCUTANEOUS | Status: DC
  Administered 2020-12-27: 40 mg via SUBCUTANEOUS
  Filled 2020-12-27: qty 0.4

## 2020-12-27 MED ORDER — LOSARTAN POTASSIUM 50 MG PO TABS
50.0000 mg | ORAL_TABLET | Freq: Every day | ORAL | Status: DC | PRN
  Administered 2020-12-28: 50 mg via ORAL
  Filled 2020-12-27: qty 1

## 2020-12-27 MED ORDER — POTASSIUM CHLORIDE CRYS ER 20 MEQ PO TBCR
40.0000 meq | EXTENDED_RELEASE_TABLET | ORAL | Status: AC
  Administered 2020-12-27: 40 meq via ORAL
  Filled 2020-12-27: qty 2

## 2020-12-27 NOTE — ED Notes (Signed)
Per [place] the CN has not looked at the patient to approve them

## 2020-12-27 NOTE — Evaluation (Signed)
Physical Therapy Evaluation Patient Details Name: Brianna Gates MRN: [id number] DOB: 1951/02/09 Today's Date: 12/27/2020   History of Present Illness  Pt is a 70 y/o female admitted 3/14 following syncopal episode that occured while visiting with her husband in the ED. Workup pending. PMH includes [place], HTN, asthma, and Raynauds.  Clinical Impression  Pt admitted secondary to problem above with deficits below. Pt reporting wooziness throughout mobility (orthostatics below), so mobility limited to EOB. Able to take a few steps at EOB with min guard A. Pt with episode of incontinence upon standing; notified MD as pt reports this is abnormal. Anticipate pt will progress well once symptoms improve. Will continue to follow acutely to maximize functional mobility independence and safety.     12/27/20 1416  Orthostatic Lying   BP- Lying 138/69  Pulse- Lying 64  Orthostatic Sitting  BP- Sitting 160/72  Pulse- Sitting 69  Orthostatic Standing at 0 minutes  BP- Standing at 0 minutes 149/78  Pulse- Standing at 0 minutes 80      Follow Up Recommendations No PT follow up    Equipment Recommendations  None recommended by PT    Recommendations for Other Services       Precautions / Restrictions Precautions Precautions: Fall Restrictions Weight Bearing Restrictions: No      Mobility  Bed Mobility Overal bed mobility: Needs Assistance Bed Mobility: Supine to Sit;Sit to Supine     Supine to sit: Min guard Sit to supine: Supervision   General bed mobility comments: Min guard for safety to sit at EOB. Pt reporting increased wooziness upon sitting and require BUE support. Checked BP and it was at 160/72    Transfers Overall transfer level: Needs assistance Equipment used: None Transfers: Sit to/from Stand Sit to Stand: Min guard         General transfer comment: Min guard for safety. Pt with episode of incontinence upon standing. Also reporting wooziness. BP at  149/78  Ambulation/Gait Ambulation/Gait assistance: Min guard Gait Distance (Feet): 1 Feet Assistive device: None Gait Pattern/deviations: Step-through pattern Gait velocity: Decreased   General Gait Details: Able to take steps at EOB. Limited secondary to wooziness. No overt LOB noted.  Stairs            Wheelchair Mobility    Modified Rankin (Stroke Patients Only)       Balance Overall balance assessment: Mild deficits observed, not formally tested                                           Pertinent Vitals/Pain Pain Assessment: No/denies pain    Home Living Family/patient expects to be discharged to:: Private residence Living Arrangements: Spouse/significant other Available Help at Discharge: Family Type of Home: House Home Access: Stairs to enter Entrance Stairs-Rails: Left Entrance Stairs-Number of Steps: 5 Home Layout: Two level;Able to live on main level with bedroom/bathroom Home Equipment: Shower seat;Walker - 2 wheels;Cane - single point      Prior Function Level of Independence: Independent               Hand Dominance        Extremity/Trunk Assessment   Upper Extremity Assessment Upper Extremity Assessment: Overall WFL for tasks assessed    Lower Extremity Assessment Lower Extremity Assessment: Generalized weakness    Cervical / Trunk Assessment Cervical / Trunk Assessment: Normal  Communication   Communication:  No difficulties  Cognition Arousal/Alertness: Awake/alert Behavior During Therapy: WFL for tasks assessed/performed Overall Cognitive Status: Within Functional Limits for tasks assessed                                        General Comments      Exercises     Assessment/Plan    PT Assessment Patient needs continued PT services  PT Problem List Decreased activity tolerance;Decreased balance;Decreased mobility       PT Treatment Interventions DME instruction;Gait  training;Functional mobility training;Therapeutic activities;Therapeutic exercise;Balance training;Stair training;Patient/family education    PT Goals (Current goals can be found in the Care Plan section)  Acute Rehab PT Goals Patient Stated Goal: to feel better and go home PT Goal Formulation: With patient Time For Goal Achievement: 01/10/21 Potential to Achieve Goals: Good    Frequency Min 3X/week   Barriers to discharge        Co-evaluation               AM-PAC PT "6 Clicks" Mobility  Outcome Measure Help needed turning from your back to your side while in a flat bed without using bedrails?: None Help needed moving from lying on your back to sitting on the side of a flat bed without using bedrails?: A Little Help needed moving to and from a bed to a chair (including a wheelchair)?: A Little Help needed standing up from a chair using your arms (e.g., wheelchair or bedside chair)?: A Little Help needed to walk in hospital room?: A Little Help needed climbing 3-5 steps with a railing? : A Lot 6 Click Score: 18    End of Session Equipment Utilized During Treatment: Gait belt Activity Tolerance: Treatment limited secondary to medical complications (Comment) (wooziness) Patient left: in bed;with call bell/phone within reach;with family/visitor present (on stretcher in ED) Nurse Communication: Mobility status PT Visit Diagnosis: Other abnormalities of gait and mobility (R26.89)    Time: 7591-6384 PT Time Calculation (min) (ACUTE ONLY): 21 min   Charges:   PT Evaluation $PT Eval Moderate Complexity: 1 Mod          Lou Miner, DPT  Acute Rehabilitation Services  Pager: [phone] Office: [phone]   Rudean Hitt 12/27/2020, 4:29 PM

## 2020-12-27 NOTE — ED Triage Notes (Addendum)
Pt was a visitor to pt. Pt used call light and this RN responded, found pt unresponsive sitting in car. Pt incontinent, with pulse and breathing. initial pressure 63/43- HR 30s NRB applied, MD and staff at bedside.

## 2020-12-27 NOTE — H&P (Addendum)
History and Physical    Brianna Gates:[id number] DOB: [birth year] DOA: 12/27/2020  Referring MD/NP/PA: Thamas Jaegers, MD PCP: Prince Solian, MD  Patient coming from: Husband's room in ER  Chief Complaint: Passed out  I have personally briefly reviewed patient's old medical records in [place]   HPI: Brianna Gates is a 70 y.o. female with medical history significant of hypertension, hyperlipidemia, hypothyroidism, GBS, asthma, anxiety, and GERD who presents after passing out.  She was here visiting her husband who is in the emergency department with a nosebleed.  Patient had been reading about epistaxis when husband recalls that she said she "should not have read that."  She recalls getting warm, palpitations, and standing up to take off her coat.  Husband states that she passed out, but likely fell back into the chair.  Nursing staff was called into the room found the patient unresponsive with some jerking of her arms and loss of urine.  Records note patient has had 3 prior episodes over the last 3 years.  Usually symptoms have been triggered by the patient seeing blood on herself.  Last episode occurred 6 months ago when she saw a popped blood vessel in her eye.  They did not follow-up in regards to the event.  Patient had not had any symptoms while seeing her husband's nosebleed yesterday when it initially started.  She had recently had COVID-19 and had remdesivir infusion.  ED Course: While in the emergency department patient was noted to be afebrile with initial blood pressure 63/43 with heart rates in the 30s.  She was noted to be incontinent of urine.  Patient was placed on a nonrebreather mask and given 1 L normal saline IV fluids with improvement in blood pressure up to 148/68 and heart rates 59-65.  CBC, BMP, and initial troponin within normal limits.  COVID-19 screening was pending.  TRH called to admit.  Review of Systems  Constitutional: Positive for  malaise/fatigue. Negative for fever.  HENT: Negative for congestion.   Eyes: Negative for photophobia and pain.  Respiratory: Negative for cough and shortness of breath.   Cardiovascular: Positive for palpitations. Negative for chest pain and leg swelling.  Gastrointestinal: Positive for nausea. Negative for abdominal pain and vomiting.  Genitourinary: Negative for dysuria and hematuria.  Skin: Negative for rash.  Neurological: Positive for loss of consciousness. Negative for focal weakness.  Psychiatric/Behavioral: Negative for substance abuse.    Past Medical History:  Diagnosis Date  . Allergy   . Anxiety   . Asthma    INFREQUENT PROBLEM - rarely used inhaler  . Cervical spondylosis   . Cervical strain 07/22/2014  . Complication of anesthesia    UNKNOWN REACTION WITH RASH AT SITE OF IV only with knee surgery  . Degenerative arthritis   . Dyslipidemia   . Eczema   . GERD (gastroesophageal reflux disease)   . 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 History of Guillain-Barre syndrome 07/22/2014  . Hyperlipidemia   . Hypertension   . Hypothyroidism   . Lumbosacral spondylosis   . Raynaud disease   . Thyroid nodule     Past Surgical History:  Procedure Laterality Date  . ABDOMINAL HYSTERECTOMY    . COLONOSCOPY    . FOOT SURGERY Left   . GANGLION CYST EXCISION Left    Wrist  . HERNIA REPAIR    . KNEE ARTHROSCOPY     RT KNEE  . KNEE ARTHROTOMY Right 08/27/2013   Procedure: RIGHT KNEE SCAR EXCISION  AND FEMORAL REVISION;  Surgeon: Gearlean Alf, MD;  Location: [facility];  Service: Orthopedics;  Laterality: Right;  . REPLACEMENT TOTAL KNEE BILATERAL Bilateral   . STRABISMUS SURGERY Right   . TONSILLECTOMY    . vaginal vulvo prolapse  09/2015     reports that she has never smoked. She has never used smokeless tobacco. She reports current alcohol use of about 6.0 standard drinks of alcohol per week. She reports that she does not use drugs.  Allergies  Allergen Reactions  .  Buspar [Buspirone] Itching    Vivid dreams, diarrhea   . Captopril   . Oxaprozin Hives and Other (See Comments)  . Ramipril Cough and Other (See Comments)  . Zoloft [Sertraline Hcl] Diarrhea  . Ace Inhibitors     Other reaction(s): Other (See Comments) GB syndrome  . Doxycycline Other (See Comments)    Burning sensation/Rash  . Influenza Vaccines     HX   . Sulfa Antibiotics Other (See Comments)    unknown    Family History  Problem Relation Age of Onset  . Hypertension Mother   . Other Mother        Dyslipidemia  . Heart disease Father   . Other Brother        Dyslipidemia  . Stomach cancer Paternal Uncle   . Colon cancer Neg Hx   . Rectal cancer Neg Hx   . Esophageal cancer Neg Hx     Prior to Admission medications   Medication Sig Start Date End Date Taking? Authorizing Provider  acetaminophen (TYLENOL) 650 MG CR tablet Take by mouth. As needed.    [provider]  albuterol (PROVENTIL HFA;VENTOLIN HFA) 108 (90 BASE) MCG/ACT inhaler Inhale 2 puffs into the lungs every 6 (six) hours as needed for wheezing or shortness of breath.    [provider]  amLODipine (NORVASC) 5 MG tablet Take 5 mg by mouth daily. 10/04/19   [provider]  amLODipine-benazepril (LOTREL) 10-20 MG capsule daily    [provider]  Ascorbic Acid (VITAMIN C PO) Take by mouth.    [provider]  azelastine (ASTELIN) 0.1 % nasal spray azelastine 137 mcg (0.1 %) nasal spray aerosol    [provider]  Carboxymethylcellulose Sodium (EYE DROPS OP) Apply 1 drop to eye 2 (two) times daily. RETAIN EYE DROPS- GETS FROM OPTOMETRIST    [provider]  cephALEXin (KEFLEX) 500 MG capsule Take 1 capsule (500 mg total) by mouth 3 (three) times daily. 11/21/19   Felipa Furnace, DPM  Cholecalciferol (VITAMIN D) 2000 UNITS CAPS Take 1 capsule by mouth daily.    [provider]  DULoxetine (CYMBALTA) 20 MG capsule Take by mouth.    [provider]  EPINEPHrine 0.3 mg/0.3 mL IJ SOAJ injection Inject into the muscle. 11/21/19   [provider]  ESTRADIOL PO Take by mouth.    [provider]  fluticasone (FLONASE) 50 MCG/ACT nasal spray Place into the nose.    [provider]  gabapentin (NEURONTIN) 100 MG capsule TAKE ONE CAPSULE TWICE A DAY AND TAKE 4 CAPSULES AT BEDTIME 12/14/20   Suzzanne Cloud, NP  HYDROcodone-acetaminophen [facility]) 10-325 MG tablet Take by mouth. 08/18/20   [provider]  ibuprofen (ADVIL,MOTRIN) 100 MG tablet Take 100 mg by mouth 2 (two) times daily as needed for pain or fever.     [provider]  levothyroxine (SYNTHROID) 50 MCG tablet Take 50 mcg by mouth daily before  breakfast.     [provider]  losartan (COZAAR) 50 MG tablet Take by mouth.    [provider]  nitrofurantoin, macrocrystal-monohydrate, (MACROBID) 100 MG capsule Take 100 mg by mouth 2 (two) times daily. 08/19/20   [provider]  [facility]  Antifungal (nail)-#1    [provider]  pantoprazole (PROTONIX) 40 MG tablet Take 40 mg by mouth daily. 08/04/20   [provider]  rosuvastatin (CRESTOR) 5 MG tablet Take 5 mg by mouth daily.    [provider]  Soft Lens Products (REFRESH CONTACTS DROPS) SOLN Apply to eye.    [provider]    Physical Exam:  Constitutional: Elderly female currently in no acute distress Vitals:   12/27/20 0945 12/27/20 1000 12/27/20 1015 12/27/20 1030  BP: 136/66 136/66 (!) 145/66 (!) 142/67  Pulse: 62 65 64 63  Resp: 16 13 10 13   Temp:      TempSrc:      SpO2: 100% 100% 100% 100%  Weight:      Height:       Eyes: PERRL, lids and conjunctivae normal ENMT: Mucous membranes are dry. Posterior pharynx clear of any exudate or lesions.  Neck: normal, supple, no masses, no thyromegaly Respiratory: clear to auscultation bilaterally, no wheezing, no crackles. Normal  respiratory effort. No accessory muscle use.  Nasal cannula oxygen with O2 saturations maintained. Cardiovascular: Regular rate and rhythm, no murmurs / rubs / gallops. No extremity edema. 2+ pedal pulses. No carotid bruits.  Abdomen: no tenderness, no masses palpated. No hepatosplenomegaly. Bowel sounds positive.  Musculoskeletal: no clubbing / cyanosis. No joint deformity upper and lower extremities. Good ROM, no contractures. Normal muscle tone.  Skin: no rashes, lesions, ulcers. No induration Neurologic: CN 2-12 grossly intact. Strength 5/5 in all 4.  Psychiatric: Normal judgment and insight. Alert and oriented x 3. Normal mood.     Labs on Admission: I have personally reviewed following labs and imaging studies  CBC: Recent Labs  Lab 12/27/20 0937 12/27/20 0941  WBC  --  6.6  NEUTROABS  --  3.5  HGB 14.6 14.3  HCT 43.0 44.0  MCV  --  99.1  PLT  --  073   Basic Metabolic Panel: Recent Labs  Lab 12/27/20 0937 12/27/20 0941  NA 138 136  K 3.9 3.5  CL 103 102  CO2  --  26  GLUCOSE 94 95  BUN 23 17  CREATININE 0.60 0.72  CALCIUM  --  9.4   GFR: Estimated Creatinine Clearance: 58.6 mL/min (by C-G formula based on SCr of 0.72 mg/dL). Liver Function Tests: No results for input(s): AST, ALT, ALKPHOS, BILITOT, PROT, ALBUMIN in the last 168 hours. No results for input(s): LIPASE, AMYLASE in the last 168 hours. No results for input(s): AMMONIA in the last 168 hours. Coagulation Profile: No results for input(s): INR, PROTIME in the last 168 hours. Cardiac Enzymes: No results for input(s): CKTOTAL, CKMB, CKMBINDEX, TROPONINI in the last 168 hours. BNP (last 3 results) No results for input(s): PROBNP in the last 8760 hours. HbA1C: No results for input(s): HGBA1C in the last 72 hours. CBG: No results for input(s): GLUCAP in the last 168 hours. Lipid Profile: No results for input(s): CHOL, HDL, LDLCALC, TRIG, CHOLHDL, LDLDIRECT in the last 72 hours. Thyroid Function  Tests: No results for input(s): TSH, T4TOTAL, FREET4, T3FREE, THYROIDAB in the last 72 hours. Anemia Panel: No results for input(s): VITAMINB12, FOLATE, FERRITIN, TIBC, IRON, RETICCTPCT in the  last 72 hours. Urine analysis:    Component Value Date/Time   COLORURINE YELLOW 07/26/2011 1444   APPEARANCEUR CLEAR 07/26/2011 1444   LABSPEC 1.012 07/26/2011 1444   PHURINE 6.5 07/26/2011 1444   GLUCOSEU NEGATIVE 07/26/2011 1444   HGBUR TRACE (A) 07/26/2011 1444   BILIRUBINUR NEGATIVE 07/26/2011 1444   KETONESUR NEGATIVE 07/26/2011 1444   PROTEINUR NEGATIVE 07/26/2011 1444   UROBILINOGEN 0.2 07/26/2011 1444   NITRITE NEGATIVE 07/26/2011 1444   LEUKOCYTESUR NEGATIVE 07/26/2011 1444   Sepsis Labs: No results found for this or any previous visit (from the past 240 hour(s)).   Radiological Exams on Admission: DG CHEST PORT 1 VIEW  Result Date: 12/27/2020 CLINICAL DATA:  Syncope with hypertension EXAM: PORTABLE CHEST 1 VIEW COMPARISON:  August 18, 2013 FINDINGS: Lungs are clear. Heart size and pulmonary vascularity are normal. No adenopathy. No bone lesions. IMPRESSION: Lungs clear.  Heart size normal. Electronically Signed   By: Lowella Grip III M.D.   On: 12/27/2020 11:55    EKG: Independently reviewed.  Sinus rhythm at 64 bpm with first-degree heart block  Assessment/Plan Syncope and collapse: Acute.  Patient with acute loss of consciousness after reading about nosebleeds.  Reported having palpitations, getting warm, and standing up prior to passing out.  She was temporarily noted to be hypotensive and bradycardic with resolution of these after being given 1 L normal saline IV fluids.  Suspect likely secondary to a vasovagal event related to what the patient was reading about nosebleeds with convulsive syncope.  Patient lethargic thereafter, but no focal deficits appreciated. -Admit to a telemetry bed -Check CT of the brain w/o contrast -Check orthostatic vital signs -Check UDS and  TSH -Follow-up telemetry  -Plan for discharge in a.m. if everything remains stable overnight  Hypotension and bradycardia: Resolving.  Patient temporarily noted to have blood pressures in 60s with heart rates in the 30s after likely having vasovagal syncope.  She is not on any significant rate controlling medications Home medications include includes Norvasc 5 mg nightly and losartan 50 mg p.o. as needed systolic blood pressure greater than 140. -Restart blood pressure medications tomorrow if blood pressure stable  Hypothyroidism -Follow-up to TSH -Continue levothyroxine  Hyperlipidemia -Continue Crestor  History of COVID-19 infection: Patient has been diagnosed with COVID-19 at the end of January and had received 3 doses of remdesivir IV.  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  DVT prophylaxis: Lovenox Code Status: Full Family Communication: Husband updated at bedside Disposition Plan: Likely discharge home in a.m. Consults called: None Admission status: Observation requiring less than 2 midnights stay  Norval Morton MD [facility]   If 7PM-7AM, please contact night-coverage   12/27/2020, 11:25 AM

## 2020-12-27 NOTE — ED Provider Notes (Signed)
[facility] EMERGENCY DEPARTMENT Provider Note   CSN: [id number] Arrival date & time: 12/27/20  1448     History Chief Complaint  Patient presents with  . Loss of Consciousness    Brianna Gates is a 70 y.o. female.  Patient presents to ER chief complaint of syncope.  She was visiting her husband who is being seen for epistaxis.  She had been doing well sitting in a chair, she states she was reading about epistaxis when the last and she knew she felt lightheaded and dizzy.  Nurses noted that she had passed out and was unresponsive and brought her to her bed.  Her heart rate had dropped to about 25 to 40 bpm and her blood pressure was 60/40.        Past Medical History:  Diagnosis Date  . Allergy   . Anxiety   . Asthma    INFREQUENT PROBLEM - rarely used inhaler  . Cervical spondylosis   . Cervical strain 07/22/2014  . Complication of anesthesia    UNKNOWN REACTION WITH RASH AT SITE OF IV only with knee surgery  . Degenerative arthritis   . Dyslipidemia   . Eczema   . GERD (gastroesophageal reflux disease)   . 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 History of Guillain-Barre syndrome 07/22/2014  . Hyperlipidemia   . Hypertension   . Hypothyroidism   . Lumbosacral spondylosis   . Raynaud disease   . Thyroid nodule     Patient Active Problem List   Diagnosis Date Noted  . History of Guillain-Barre syndrome 07/22/2014  . Cervical strain 07/22/2014  . Postoperative stiffness of total knee replacement ([place]) 08/27/2013  . Abnormality of gait 08/22/2012  . Cervical spondylosis without myelopathy 08/22/2012  . Muscle weakness (generalized) 08/22/2012  . 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 Unspecified essential hypertension 08/22/2012  . Strabismus 08/22/2012  . Degenerative joint disease of knee, left 08/22/2012  . Unspecified hypothyroidism 08/22/2012  . Raynaud's disease 08/22/2012  . 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 ABDOMINAL HYSTERECTOMY    . COLONOSCOPY    . FOOT SURGERY Left   . GANGLION CYST EXCISION Left    Wrist  . HERNIA REPAIR    . KNEE ARTHROSCOPY     RT KNEE  . KNEE ARTHROTOMY Right 08/27/2013   Procedure: RIGHT KNEE SCAR EXCISION AND FEMORAL REVISION;  Surgeon: Gearlean Alf, MD;  Location: [facility];  Service: Orthopedics;  Laterality: Right;  . REPLACEMENT TOTAL KNEE BILATERAL Bilateral   . STRABISMUS SURGERY Right   . TONSILLECTOMY    . vaginal vulvo prolapse  09/2015     OB History   No obstetric history on file.     Family History  Problem Relation Age of Onset  . Hypertension Mother   . Other Mother        Dyslipidemia  . Heart disease Father   . Other Brother        Dyslipidemia  . Stomach cancer Paternal Uncle   . Colon cancer Neg Hx   . Rectal cancer Neg Hx   . Esophageal cancer Neg Hx     Social History   Tobacco Use  . Smoking status: Never Smoker  . Smokeless tobacco: Never Used  Substance Use Topics  . Alcohol use: Yes    Alcohol/week: 6.0 standard drinks    Types: 6 Glasses of wine per week  Comment: wine weekly  . Drug use: No    Home Medications Prior to Admission medications   Medication Sig Start Date End Date Taking? Authorizing Provider  acetaminophen (TYLENOL) 650 MG CR tablet Take by mouth. As needed.    [provider]  albuterol (PROVENTIL HFA;VENTOLIN HFA) 108 (90 BASE) MCG/ACT inhaler Inhale 2 puffs into the lungs every 6 (six) hours as needed for wheezing or shortness of breath.    [provider]  amLODipine (NORVASC) 5 MG tablet Take 5 mg by mouth daily. 10/04/19   [provider]  amLODipine-benazepril (LOTREL) 10-20 MG capsule daily    [provider]  Ascorbic Acid (VITAMIN C PO) Take by mouth.    [provider]  azelastine (ASTELIN) 0.1 % nasal spray azelastine 137 mcg (0.1 %) nasal spray aerosol    [provider]   Carboxymethylcellulose Sodium (EYE DROPS OP) Apply 1 drop to eye 2 (two) times daily. RETAIN EYE DROPS- GETS FROM OPTOMETRIST    [provider]  cephALEXin (KEFLEX) 500 MG capsule Take 1 capsule (500 mg total) by mouth 3 (three) times daily. 11/21/19   Felipa Furnace, DPM  Cholecalciferol (VITAMIN D) 2000 UNITS CAPS Take 1 capsule by mouth daily.    [provider]  DULoxetine (CYMBALTA) 20 MG capsule Take by mouth.    [provider]  EPINEPHrine 0.3 mg/0.3 mL IJ SOAJ injection Inject into the muscle. 11/21/19   [provider]  ESTRADIOL PO Take by mouth.    [provider]  fluticasone (FLONASE) 50 MCG/ACT nasal spray Place into the nose.    [provider]  gabapentin (NEURONTIN) 100 MG capsule TAKE ONE CAPSULE TWICE A DAY AND TAKE 4 CAPSULES AT BEDTIME 12/14/20   Suzzanne Cloud, NP  HYDROcodone-acetaminophen [facility]) 10-325 MG tablet Take by mouth. 08/18/20   [provider]  ibuprofen (ADVIL,MOTRIN) 100 MG tablet Take 100 mg by mouth 2 (two) times daily as needed for pain or fever.     [provider]  levothyroxine (SYNTHROID) 50 MCG tablet Take 50 mcg by mouth daily before breakfast.     [provider]  losartan (COZAAR) 50 MG tablet Take by mouth.    [provider]  nitrofurantoin, macrocrystal-monohydrate, (MACROBID) 100 MG capsule Take 100 mg by mouth 2 (two) times daily. 08/19/20   [provider]  [facility]  Antifungal (nail)-#1    [provider]  pantoprazole (PROTONIX) 40 MG tablet Take 40 mg by mouth daily. 08/04/20   [provider]  rosuvastatin (CRESTOR) 5 MG tablet Take 5 mg by mouth daily.    [provider]  Soft Lens Products (REFRESH CONTACTS DROPS) SOLN Apply to eye.    [provider]    Allergies    Buspar [buspirone], Captopril, Oxaprozin, Ramipril, Zoloft [sertraline hcl], Ace inhibitors, Doxycycline, Influenza  vaccines, and Sulfa antibiotics  Review of Systems   Review of Systems  Constitutional: Negative for fever.  HENT: Negative for ear pain.   Eyes: Negative for pain.  Respiratory: Negative for cough.   Cardiovascular: Negative for chest pain.  Gastrointestinal: Negative for abdominal pain.  Genitourinary: Negative for flank pain.  Musculoskeletal: Negative for back pain.  Skin: Negative for rash.  Neurological: Negative for headaches.    Physical Exam Updated Vital Signs BP (!) 142/67   Pulse 63   Temp 98.7 F (37.1 C) (Oral)   Resp 13   Ht 5\' 5"  (1.651 m)  Wt 56.7 kg   SpO2 100%   BMI 20.80 kg/m   Physical Exam Constitutional:      General: She is not in acute distress.    Appearance: Normal appearance.  HENT:     Head: Normocephalic.     Nose: Nose normal.  Eyes:     Extraocular Movements: Extraocular movements intact.  Cardiovascular:     Rate and Rhythm: Normal rate.  Pulmonary:     Effort: Pulmonary effort is normal.  Musculoskeletal:        General: Normal range of motion.     Cervical back: Normal range of motion.  Neurological:     General: No focal deficit present.     Mental Status: She is alert. Mental status is at baseline.     ED Results / Procedures / Treatments   Labs (all labs ordered are listed, but only abnormal results are displayed) Labs Reviewed  I-STAT CHEM 8, ED - Abnormal; Notable for the following components:      Result Value   Calcium, Ion 1.11 (*)    All other components within normal limits  SARS CORONAVIRUS 2 (TAT 6-24 HRS)  BASIC METABOLIC PANEL  CBC WITH DIFFERENTIAL/PLATELET  TROPONIN I (HIGH SENSITIVITY)  TROPONIN I (HIGH SENSITIVITY)    EKG None  Radiology No results found.  Procedures .Critical Care Performed by: Luna Fuse, MD Authorized by: Luna Fuse, MD   Critical care provider statement:    Critical care time (minutes):  30   Critical care time was exclusive of:  Separately billable  procedures and treating other patients   Critical care was necessary to treat or prevent imminent or life-threatening deterioration of the following conditions:  Circulatory failure     Medications Ordered in ED Medications  sodium chloride 0.9 % bolus 1,000 mL (0 mLs Intravenous Stopped 12/27/20 1035)  ondansetron (ZOFRAN) injection 4 mg (4 mg Intravenous Given 12/27/20 1048)    ED Course  I have reviewed the triage vital signs and the nursing notes.  Pertinent labs & imaging results that were available during my care of the patient were reviewed by me and considered in my medical decision making (see chart for details).    MDM Rules/Calculators/A&P                          I was called immediately to her bedside by nursing team.  Unclear if this is seizure-like episode of syncope initially.  Upon my arrival she was unresponsive and had urinated on herself.  Heart rate continued to bradycardia down into the 30 to 40 bpm range.  Blood pressure about 60 systolic.  Per nursing staff she had a few jerking movements.  Upon my arrival she appeared flaccid and severely obtunded.  Patient given IV fluid resuscitation atropine ordered, however the heart rate gradually began to increase to about 60 bpm.  Patient placed on a nonrebreather facemask.  She continued improvement of blood pressure and vital signs.  Heart rate now about 60-70 bpm blood pressure about 347 systolic.  However the patient still feels very weak fatigued and somnolent.  She is arousable and can give a good history, she has no focal neuro deficit but appears very tired.   Final Clinical Impression(s) / ED Diagnoses Final diagnoses:  Syncope and collapse    Rx / DC Orders ED Discharge Orders    None       Luna Fuse, MD 12/27/20 [phone]

## 2020-12-27 NOTE — ED Notes (Signed)
At this time CN is on lunch and has not looked at the patient to approve it.

## 2020-12-27 NOTE — Progress Notes (Signed)
Sent note to Dr. Tamala Julian:  Tele reported that pt. had 8 beats of vtach. Heart rate did not exceed 125 and patient is asymptomatic.

## 2020-12-27 NOTE — ED Notes (Signed)
Patient to floor with transport on tele at this time

## 2020-12-28 ENCOUNTER — Encounter (HOSPITAL_COMMUNITY): Payer: Self-pay | Admitting: Internal Medicine

## 2020-12-28 ENCOUNTER — Observation Stay (HOSPITAL_BASED_OUTPATIENT_CLINIC_OR_DEPARTMENT_OTHER): Payer: PPO

## 2020-12-28 ENCOUNTER — Other Ambulatory Visit: Payer: Self-pay

## 2020-12-28 DIAGNOSIS — J45909 Unspecified asthma, uncomplicated: Secondary | ICD-10-CM | POA: Diagnosis not present

## 2020-12-28 DIAGNOSIS — Z20822 Contact with and (suspected) exposure to covid-19: Secondary | ICD-10-CM | POA: Diagnosis not present

## 2020-12-28 DIAGNOSIS — I10 Essential (primary) hypertension: Secondary | ICD-10-CM | POA: Diagnosis not present

## 2020-12-28 DIAGNOSIS — R001 Bradycardia, unspecified: Secondary | ICD-10-CM | POA: Diagnosis not present

## 2020-12-28 DIAGNOSIS — E039 Hypothyroidism, unspecified: Secondary | ICD-10-CM | POA: Diagnosis not present

## 2020-12-28 DIAGNOSIS — R55 Syncope and collapse: Secondary | ICD-10-CM | POA: Diagnosis not present

## 2020-12-28 DIAGNOSIS — I959 Hypotension, unspecified: Secondary | ICD-10-CM | POA: Diagnosis not present

## 2020-12-28 DIAGNOSIS — Z79899 Other long term (current) drug therapy: Secondary | ICD-10-CM | POA: Diagnosis not present

## 2020-12-28 DIAGNOSIS — Z8616 Personal history of COVID-19: Secondary | ICD-10-CM | POA: Diagnosis not present

## 2020-12-28 LAB — BASIC METABOLIC PANEL
Anion gap: 7 (ref 5–15)
BUN: 12 mg/dL (ref 8–23)
CO2: 25 mmol/L (ref 22–32)
Calcium: 9.1 mg/dL (ref 8.9–10.3)
Chloride: 106 mmol/L (ref 98–111)
Creatinine, Ser: 0.65 mg/dL (ref 0.44–1.00)
GFR, Estimated: >60 mL/min (ref >60)
Glucose, Bld: 105 mg/dL — ABNORMAL HIGH (ref 70–99)
Potassium: 4 mmol/L (ref 3.5–5.1)
Sodium: 138 mmol/L (ref 135–145)

## 2020-12-28 LAB — ECHOCARDIOGRAM COMPLETE
Area-P 1/2: 3.37 cm2
Height: 65 in
S' Lateral: 2.5 cm
Weight: 2000 oz

## 2020-12-28 LAB — CBC
HCT: 43.7 % (ref 36.0–46.0)
Hemoglobin: 14.2 g/dL (ref 12.0–15.0)
MCH: 32.1 pg (ref 26.0–34.0)
MCHC: 32.5 g/dL (ref 30.0–36.0)
MCV: 98.6 fL (ref 80.0–100.0)
Platelets: 162 10*3/uL (ref 150–400)
RBC: 4.43 MIL/uL (ref 3.87–5.11)
RDW: 14.3 % (ref 11.5–15.5)
WBC: 6 10*3/uL (ref 4.0–10.5)
nRBC: 0 % (ref 0.0–0.2)

## 2020-12-28 LAB — HIV ANTIBODY (ROUTINE TESTING W REFLEX): HIV Screen 4th Generation wRfx: NONREACTIVE

## 2020-12-28 MED ORDER — LIP MEDEX EX OINT
TOPICAL_OINTMENT | CUTANEOUS | Status: DC | PRN
  Filled 2020-12-28: qty 7

## 2020-12-28 MED ORDER — THIAMINE HCL 100 MG/ML IJ SOLN: 100.0000 mg | Freq: Every day | INTRAMUSCULAR | Status: DC

## 2020-12-28 MED ORDER — LORAZEPAM 1 MG PO TABS: 1.0000 mg | ORAL_TABLET | ORAL | Status: DC | PRN

## 2020-12-28 MED ORDER — LOSARTAN POTASSIUM 25 MG PO TABS
25.0000 mg | ORAL_TABLET | Freq: Every day | ORAL | 0 refills | Status: DC
Start: 2020-12-28 — End: 2021-12-12

## 2020-12-28 MED ORDER — THIAMINE HCL 100 MG PO TABS: 100.0000 mg | ORAL_TABLET | Freq: Every day | ORAL | 0 refills | Status: AC

## 2020-12-28 MED ORDER — THIAMINE HCL 100 MG PO TABS
100.0000 mg | ORAL_TABLET | Freq: Every day | ORAL | Status: DC
  Administered 2020-12-28: 100 mg via ORAL
  Filled 2020-12-28: qty 1

## 2020-12-28 MED ORDER — AMLODIPINE BESYLATE 2.5 MG PO TABS
2.5000 mg | ORAL_TABLET | Freq: Every day | ORAL | Status: DC
  Administered 2020-12-28: 2.5 mg via ORAL
  Filled 2020-12-28: qty 1

## 2020-12-28 MED ORDER — FOLIC ACID 1 MG PO TABS
1.0000 mg | ORAL_TABLET | Freq: Every day | ORAL | Status: DC
  Administered 2020-12-28: 1 mg via ORAL
  Filled 2020-12-28: qty 1

## 2020-12-28 MED ORDER — LOSARTAN POTASSIUM 50 MG PO TABS
25.0000 mg | ORAL_TABLET | Freq: Every day | ORAL | Status: DC
  Administered 2020-12-28: 25 mg via ORAL
  Filled 2020-12-28: qty 1

## 2020-12-28 MED ORDER — ADULT MULTIVITAMIN W/MINERALS CH
1.0000 | ORAL_TABLET | Freq: Every day | ORAL | Status: DC
  Administered 2020-12-28: 1 via ORAL
  Filled 2020-12-28: qty 1

## 2020-12-28 MED ORDER — LORAZEPAM 2 MG/ML IJ SOLN: 1.0000 mg | INTRAMUSCULAR | Status: DC | PRN

## 2020-12-28 MED ORDER — AMLODIPINE BESYLATE 2.5 MG PO TABS: 2.5000 mg | ORAL_TABLET | Freq: Every day | ORAL | 0 refills | Status: DC

## 2020-12-28 MED ORDER — FOLIC ACID 1 MG PO TABS: 1.0000 mg | ORAL_TABLET | Freq: Every day | ORAL | 0 refills | Status: AC

## 2020-12-28 MED ORDER — ADULT MULTIVITAMIN W/MINERALS CH: 1.0000 | ORAL_TABLET | Freq: Every day | ORAL | 0 refills | Status: AC

## 2020-12-28 NOTE — Care Management (Signed)
Unable to reach patient to do Observation notice

## 2020-12-28 NOTE — Discharge Summary (Signed)
Discharge Summary  Brianna Gates FTD:[id number] DOB: 11/05/1950  PCP: Prince Solian, MD  Admit date: 12/27/2020 Discharge date: 12/28/2020  Time spent: 35 minutes.  Recommendations for Outpatient Follow-up:  1. Follow-up with your primary care provider within a week. 2. Take your medications as prescribed. 3. Continue fall precautions.  Discharge Diagnoses:  Active Hospital Problems   Diagnosis Date Noted  . Syncope and collapse 12/27/2020  . Hypotension 12/27/2020  . Bradycardia 12/27/2020  . History of COVID-19 12/27/2020  . Hypothyroidism 08/22/2012    Resolved Hospital Problems  No resolved problems to display.    Discharge Condition: Stable.  Diet recommendation: Resume previous diet.  Vitals:   12/28/20 0231 12/28/20 0310  BP: 139/62 126/74  Pulse: (!) 54 (!) 53  Resp: 11 20  Temp:  98.3 F (36.8 C)  SpO2: 98% 98%    History of present illness:  Brianna Gates is a 70 y.o. female with medical history significant of hypertension, hyperlipidemia, hypothyroidism, GBS, asthma, anxiety, and GERD who presents after passing out.  She was here visiting her husband who is in the emergency department with a nosebleed.  Patient had been reading about epistaxis when husband recalls that she said she "should not have read that."  She recalls getting warm, palpitations, and standing up to take off her coat.  Husband states that she passed out, but likely fell back into the chair.  Nursing staff was called into the room found the patient unresponsive with some jerking of her arms and loss of urine.  Records note patient has had 3 prior episodes over the last 3 years.  Usually symptoms have been triggered by the patient seeing blood on herself.  Last episode occurred 6 months ago when she saw a popped blood vessel in her eye.  They did not follow-up in regards to the event.  Patient had not had any symptoms while seeing her husband's nosebleed yesterday when it initially  started.  She had recently had COVID-19 and had remdesivir infusion.  ED Course: While in the emergency department patient was noted to be afebrile with initial blood pressure 63/43 with heart rates in the 30s.  She was noted to be incontinent of urine.  Patient was placed on a nonrebreather mask and given 1 L normal saline IV fluids with improvement in blood pressure up to 148/68 and heart rates 59-65.  CBC, BMP, and initial troponin within normal limits.  COVID-19 screening was pending.  [facility] called to admit.  12/28/20: Patient was seen and examined at her bedside.  Her husband was present.  States she feels better.  Bradycardia noted on telemetry in the room this morning in the high 50s.  Reduce the dose of Norvasc to 2.5 mg daily.  We will continue losartan scheduled instead of as needed to treat hypertension.  Her dose has been reduced to 25 mg daily from 50 mg daily.  A 2D echo was completed and was unremarkable.  Normal LVEF 60 to 65%, no regional wall motion abnormalities, left ventricle diastolic parameters were normal.  Right ventricular systolic function is normal.  The right ventricular size is normal.  The mitral valve is normal in structure.  No evidence of mitral valve regurgitation.  No evidence of mitral stenosis.  The aortic valve is normal in structure.  Aortic valve regurgitation is not visualized.  No aortic stenosis is present.  The inferior vena cava is normal in size with greater than 50% respiratory variability.  Vital signs and labs  have been reviewed and are stable.  Patient had a syncope and collapse secondary to a vasovagal event.    Hospital Course:  Principal Problem:   Syncope and collapse Active Problems:   Hypothyroidism   Hypotension   Bradycardia   History of COVID-19  Syncope secondary to vasovagal event. Syncope and collapse: Acute.  Patient with acute loss of consciousness after reading about nosebleeds.  Reported having palpitations, getting warm, and  standing up prior to passing out.  She was temporarily noted to be hypotensive and bradycardic with resolution of these after being given 1 L normal saline IV fluids.  Suspect likely secondary to a vasovagal event related to what the patient was reading about nosebleeds with convulsive syncope.  Patient lethargic thereafter, but no focal deficits appreciated. CT head unremarkable for any acute intracranial findings. 2D echo unremarkable for any cardiac structure abnormalities. UDS negative TSH normal Avoid dehydration. Avoid triggers  Essential hypertension Resume home Norvasc and home losartan at lower doses Norvasc 2.5 mg daily, losartan 25 mg daily. Follow-up with your primary care provider within 7 days.  Sinus bradycardia likely secondary to use of calcium channel blocker. Heart rate in the high 50s. TSH was normal. Reduce dose of Norvasc down to 2.5 mg from 5 mg daily. Follow-up with your primary care provider within 7 days.  Hypothyroidism TSH normal Resume home levothyroxine.  Chronic anxiety/depression Stable Resume home regimen.    Procedures:  2D echo on 12/28/2020.  Consultations:  None.  Discharge Exam: BP 126/74 (BP Location: Right Arm)   Pulse (!) 53   Temp 98.3 F (36.8 C) (Oral)   Resp 20   Ht 5\' 5"  (1.651 m)   Wt 56.7 kg   SpO2 98%   BMI 20.80 kg/m  . General: 70 y.o. year-old female well developed well nourished in no acute distress.  Alert and oriented x3. . Cardiovascular: Regular rate and rhythm with no rubs or gallops.  No thyromegaly or JVD noted.   Marland Kitchen Respiratory: Clear to auscultation with no wheezes or rales. Good inspiratory effort. . Abdomen: Soft nontender nondistended with normal bowel sounds x4 quadrants. . Musculoskeletal: No lower extremity edema. 2/4 pulses in all 4 extremities. . Skin: No ulcerative lesions noted or rashes. . Psychiatry: Mood is appropriate for condition and setting  Discharge Instructions You were cared for  by a hospitalist during your hospital stay. If you have any questions about your discharge medications or the care you received while you were in the hospital after you are discharged, you can call the unit and asked to speak with the hospitalist on call if the hospitalist that took care of you is not available. Once you are discharged, your primary care physician will handle any further medical issues. Please note that NO REFILLS for any discharge medications will be authorized once you are discharged, as it is imperative that you return to your primary care physician (or establish a relationship with a primary care physician if you do not have one) for your aftercare needs so that they can reassess your need for medications and monitor your lab values.   Allergies as of 12/28/2020      Reactions   Buspar [buspirone] Itching   Vivid dreams, diarrhea   Captopril Other (See Comments)   reaction   Oxaprozin Hives, Other (See Comments)   Ramipril Cough, Other (See Comments)   Zoloft [sertraline Hcl] Diarrhea   Ace Inhibitors    Other reaction(s): Other (See Comments) GB syndrome  Doxycycline Other (See Comments)   Burning sensation/Rash   Influenza Vaccines    HX    Sulfa Antibiotics Other (See Comments)   unknown      Medication List    STOP taking these medications   cephALEXin 500 MG capsule Commonly known as: KEFLEX     TAKE these medications   acetaminophen 650 MG CR tablet Commonly known as: TYLENOL Take 1,300 mg by mouth every 8 (eight) hours as needed for pain or fever.   albuterol 108 (90 Base) MCG/ACT inhaler Commonly known as: VENTOLIN HFA Inhale 2 puffs into the lungs every 6 (six) hours as needed for wheezing or shortness of breath.   amLODipine 2.5 MG tablet Commonly known as: NORVASC Take 1 tablet (2.5 mg total) by mouth daily. What changed:   medication strength  how much to take  when to take this   amoxicillin 500 MG capsule Commonly known as:  AMOXIL Take 2,000 mg by mouth once as needed (prior to dental appointments).   azelastine 0.1 % nasal spray Commonly known as: ASTELIN Place 1 spray into both nostrils 2 (two) times daily as needed (cold symptoms).   DULoxetine 20 MG capsule Commonly known as: CYMBALTA Take 20 mg by mouth daily.   EPINEPHrine 0.3 mg/0.3 mL Soaj injection Commonly known as: EPI-PEN Inject 0.3 mg into the muscle once as needed for anaphylaxis.   estradiol 0.5 MG tablet Commonly known as: ESTRACE Take 0.5 mg by mouth daily as needed (hot flashes).   EYE DROPS OP Place 1 drop into both eyes See admin instructions. Place one drop in each eye every morning.  May take an additional dose in the evening as needed for dry eyes.   fluticasone 50 MCG/ACT nasal spray Commonly known as: FLONASE Place 1 spray into both nostrils daily.   folic acid 1 MG tablet Commonly known as: FOLVITE Take 1 tablet (1 mg total) by mouth daily. Start taking on: December 29, 2020   gabapentin 100 MG capsule Commonly known as: NEURONTIN TAKE ONE CAPSULE TWICE A DAY AND TAKE 4 CAPSULES AT BEDTIME What changed:   how much to take  how to take this  when to take this  additional instructions   ibuprofen 100 MG tablet Commonly known as: ADVIL Take 200-600 mg by mouth 3 (three) times daily as needed for pain or fever.   levothyroxine 50 MCG tablet Commonly known as: SYNTHROID Take 50 mcg by mouth daily before breakfast.   losartan 25 MG tablet Commonly known as: COZAAR Take 1 tablet (25 mg total) by mouth daily. What changed:   medication strength  how much to take  when to take this  reasons to take this   montelukast 10 MG tablet Commonly known as: SINGULAIR Take 10 mg by mouth daily as needed for allergies.   multivitamin with minerals Tabs tablet Take 1 tablet by mouth daily. Start taking on: December 29, 2020   rosuvastatin 5 MG tablet Commonly known as: CRESTOR Take 5 mg by mouth every other  day.   thiamine 100 MG tablet Take 1 tablet (100 mg total) by mouth daily. Start taking on: December 29, 2020   VITAMIN C PO Take 1 tablet by mouth daily as needed (sick symptoms).   Vitamin D 50 MCG (2000 UT) Caps Take 2,000 Units by mouth daily in the afternoon.      Allergies  Allergen Reactions  . Buspar [Buspirone] Itching    Vivid dreams, diarrhea   . Captopril Other (  See Comments)    reaction  . Oxaprozin Hives and Other (See Comments)  . Ramipril Cough and Other (See Comments)  . Zoloft [Sertraline Hcl] Diarrhea  . Ace Inhibitors     Other reaction(s): Other (See Comments) GB syndrome  . Doxycycline Other (See Comments)    Burning sensation/Rash  . Influenza Vaccines     HX   . Sulfa Antibiotics Other (See Comments)    unknown    Follow-up Information    Avva, Ravisankar, MD. Call in 1 day(s).   Specialty: Internal Medicine Why: Please call for a post hospital follow-up appointment. Contact information: [address] [place] [address] [phone]                The results of significant diagnostics from this hospitalization (including imaging, microbiology, ancillary and laboratory) are listed below for reference.    Significant Diagnostic Studies: CT Head Wo Contrast  Result Date: 12/27/2020 CLINICAL DATA:  Loss of consciousness EXAM: CT HEAD WITHOUT CONTRAST TECHNIQUE: Contiguous axial images were obtained from the base of the skull through the vertex without intravenous contrast. COMPARISON:  01/12/2010 FINDINGS: Brain: No evidence of acute infarction, hemorrhage, hydrocephalus, extra-axial collection or mass lesion/mass effect. Mild periventricular white matter hypodensity. Vascular: No hyperdense vessel or unexpected calcification. Skull: Normal. Negative for fracture or focal lesion. Sinuses/Orbits: No acute finding. Other: None. IMPRESSION: No acute intracranial pathology. Small-vessel white matter disease. Electronically Signed   By: Eddie Candle M.D.   On: 12/27/2020 13:19   DG CHEST PORT 1 VIEW  Result Date: 12/27/2020 CLINICAL DATA:  Syncope with hypertension EXAM: PORTABLE CHEST 1 VIEW COMPARISON:  August 18, 2013 FINDINGS: Lungs are clear. Heart size and pulmonary vascularity are normal. No adenopathy. No bone lesions. IMPRESSION: Lungs clear.  Heart size normal. Electronically Signed   By: Lowella Grip III M.D.   On: 12/27/2020 11:55   ECHOCARDIOGRAM COMPLETE  Result Date: 12/28/2020    ECHOCARDIOGRAM REPORT   Patient Name:   METTIE ROYLANCE Date of Exam: 12/28/2020 Medical Rec #:  [id number]          Height:       65.0 in Accession #:    [id number]         Weight:       125.0 lb Date of Birth:  1951-10-14         BSA:          1.620 m Patient Age:    11 years           BP:           126/74 mmHg Patient Gender: F                  HR:           53 bpm. Exam Location:  Inpatient Procedure: 2D Echo Indications:    Syncope R55  History:        Patient has no prior history of Echocardiogram examinations.                 Risk Factors:Hypertension and Dyslipidemia.  Sonographer:    Mikki Santee RDCS (AE) Referring Phys: [id number] [place]  1. Left ventricular ejection fraction, by estimation, is 60 to 65%. The left ventricle has normal function. The left ventricle has no regional wall motion abnormalities. Left ventricular diastolic parameters were normal.  2. Right ventricular systolic function is normal. The right ventricular size is normal.  3. The mitral  valve is normal in structure. No evidence of mitral valve regurgitation. No evidence of mitral stenosis.  4. The aortic valve is normal in structure. Aortic valve regurgitation is not visualized. No aortic stenosis is present.  5. The inferior vena cava is normal in size with greater than 50% respiratory variability, suggesting right atrial pressure of 3 mmHg. FINDINGS  Left Ventricle: Left ventricular ejection fraction, by estimation, is 60 to 65%. The left  ventricle has normal function. The left ventricle has no regional wall motion abnormalities. The left ventricular internal cavity size was normal in size. There is  no left ventricular hypertrophy. Left ventricular diastolic parameters were normal. Normal left ventricular filling pressure. Right Ventricle: The right ventricular size is normal. No increase in right ventricular wall thickness. Right ventricular systolic function is normal. Left Atrium: Left atrial size was normal in size. Right Atrium: Right atrial size was normal in size. Pericardium: There is no evidence of pericardial effusion. Mitral Valve: The mitral valve is normal in structure. No evidence of mitral valve regurgitation. No evidence of mitral valve stenosis. Tricuspid Valve: The tricuspid valve is normal in structure. Tricuspid valve regurgitation is not demonstrated. No evidence of tricuspid stenosis. Aortic Valve: The aortic valve is normal in structure. Aortic valve regurgitation is not visualized. No aortic stenosis is present. Pulmonic Valve: The pulmonic valve was normal in structure. Pulmonic valve regurgitation is not visualized. No evidence of pulmonic stenosis. Aorta: The aortic root is normal in size and structure. Venous: The inferior vena cava is normal in size with greater than 50% respiratory variability, suggesting right atrial pressure of 3 mmHg. IAS/Shunts: No atrial level shunt detected by color flow Doppler.  LEFT VENTRICLE PLAX 2D LVIDd:         3.90 cm  Diastology LVIDs:         2.50 cm  LV e' medial:    8.59 cm/s LV PW:         0.90 cm  LV E/e' medial:  9.0 LV IVS:        0.80 cm  LV e' lateral:   10.00 cm/s LVOT diam:     2.00 cm  LV E/e' lateral: 7.7 LV SV:         60 LV SV Index:   37 LVOT Area:     3.14 cm  RIGHT VENTRICLE RV S prime:     9.25 cm/s TAPSE (M-mode): 2.2 cm LEFT ATRIUM             Index       RIGHT ATRIUM           Index LA diam:        3.10 cm 1.91 cm/m  RA Area:     11.10 cm LA Vol (A2C):   26.8 ml  16.54 ml/m RA Volume:   23.30 ml  14.38 ml/m LA Vol (A4C):   21.7 ml 13.40 ml/m LA Biplane Vol: 24.1 ml 14.88 ml/m  AORTIC VALVE LVOT Vmax:   95.40 cm/s LVOT Vmean:  60.200 cm/s LVOT VTI:    0.192 m  AORTA Ao Root diam: 3.00 cm MITRAL VALVE MV Area (PHT): 3.37 cm    SHUNTS MV Decel Time: 225 msec    Systemic VTI:  0.19 m MV E velocity: 77.10 cm/s  Systemic Diam: 2.00 cm MV A velocity: 77.10 cm/s MV E/A ratio:  1.00 Mihai Croitoru MD Electronically signed by Sanda Klein MD Signature Date/Time: 12/28/2020/2:06:45 PM    Final  Microbiology: Recent Results (from the past 240 hour(s))  SARS CORONAVIRUS 2 (TAT 6-24 HRS) Nasopharyngeal Nasopharyngeal Swab     Status: None   Collection Time: 12/27/20 10:15 AM   Specimen: Nasopharyngeal Swab  Result Value Ref Range Status   SARS Coronavirus 2 NEGATIVE NEGATIVE Final    Comment: (NOTE) SARS-CoV-2 target nucleic acids are NOT DETECTED.  The SARS-CoV-2 RNA is generally detectable in upper and lower respiratory specimens during the acute phase of infection. Negative results do not preclude SARS-CoV-2 infection, do not rule out co-infections with other pathogens, and should not be used as the sole basis for treatment or other patient management decisions. Negative results must be combined with clinical observations, patient history, and epidemiological information. The expected result is Negative.  Fact Sheet for Patients: SugarRoll.be  Fact Sheet for Healthcare Providers: https://www.woods-mathews.com/  This test is not yet approved or cleared by the [place] FDA and  has been authorized for detection and/or diagnosis of SARS-CoV-2 by FDA under an Emergency Use Authorization (EUA). This EUA will remain  in effect (meaning this test can be used) for the duration of the COVID-19 declaration under Se ction 564(b)(1) of the Act, 21 U.S.C. section 360bbb-3(b)(1), unless the authorization is  terminated or revoked sooner.  Performed at [facility], [place] [address], [place],  [address]      Labs: Basic Metabolic Panel: Recent Labs  Lab 12/27/20 [phone] 12/27/20 0941 12/27/20 1928 12/28/20 0456  NA 138 136  --  138  K 3.9 3.5  --  4.0  CL 103 102  --  106  CO2  --  26  --  25  GLUCOSE 94 95  --  105*  BUN 23 17  --  12  CREATININE 0.60 0.72  --  0.65  CALCIUM  --  9.4  --  9.1  MG  --   --  2.0  --    Liver Function Tests: No results for input(s): AST, ALT, ALKPHOS, BILITOT, PROT, ALBUMIN in the last 168 hours. No results for input(s): LIPASE, AMYLASE in the last 168 hours. No results for input(s): AMMONIA in the last 168 hours. CBC: Recent Labs  Lab 12/27/20 0937 12/27/20 0941 12/28/20 0456  WBC  --  6.6 6.0  NEUTROABS  --  3.5  --   HGB 14.6 14.3 14.2  HCT 43.0 44.0 43.7  MCV  --  99.1 98.6  PLT  --  164 162   Cardiac Enzymes: No results for input(s): CKTOTAL, CKMB, CKMBINDEX, TROPONINI in the last 168 hours. BNP: BNP (last 3 results) No results for input(s): BNP in the last 8760 hours.  ProBNP (last 3 results) No results for input(s): PROBNP in the last 8760 hours.  CBG: Recent Labs  Lab 12/27/20 0925  GLUCAP 95       Signed:  Kayleen Memos, MD [facility] 12/28/2020, 2:44 PM

## 2020-12-28 NOTE — Discharge Instructions (Signed)
Syncope Syncope is when you pass out (faint) for a short time. It is caused by a sudden decrease in blood flow to the brain. Signs that you may be about to pass out include:  Feeling dizzy or light-headed.  Feeling sick to your stomach (nauseous).  Seeing all white or all black.  Having cold, clammy skin. If you pass out, get help right away. Call your local emergency services (911 in the [place] Do not drive yourself to the hospital. Follow these instructions at home: Watch for any changes in your symptoms. Take these actions to stay safe and help with your symptoms: Lifestyle  Do not drive, use machinery, or play sports until your doctor says it is okay.  Do not drink alcohol.  Do not use any products that contain nicotine or tobacco, such as cigarettes and e-cigarettes. If you need help quitting, ask your doctor.  Drink enough fluid to keep your pee (urine) pale yellow. General instructions  Take over-the-counter and prescription medicines only as told by your doctor.  If you are taking blood pressure or heart medicine, sit up and stand up slowly. Spend a few minutes getting ready to sit and then stand. This can help you feel less dizzy.  Have someone stay with you until you feel stable.  If you start to feel like you might pass out, lie down right away and raise (elevate) your feet above the level of your heart. Breathe deeply and steadily. Wait until all of the symptoms are gone.  Keep all follow-up visits as told by your doctor. This is important. Get help right away if:  You have a very bad headache.  You pass out once or more than once.  You have pain in your chest, belly, or back.  You have a very fast or uneven heartbeat (palpitations).  It hurts to breathe.  You are bleeding from your mouth or your bottom (rectum).  You have black or tarry poop (stool).  You have jerky movements that you cannot control (seizure).  You are confused.  You have trouble  walking.  You are very weak.  You have vision problems. These symptoms may be an emergency. Do not wait to see if the symptoms will go away. Get medical help right away. Call your local emergency services (911 in the [place] Do not drive yourself to the hospital. Summary  Syncope is when you pass out (faint) for a short time. It is caused by a sudden decrease in blood flow to the brain.  Signs that you may be about to faint include feeling dizzy, light-headed, or sick to your stomach, seeing all white or all black, or having cold, clammy skin.  If you start to feel like you might pass out, lie down right away and raise (elevate) your feet above the level of your heart. Breathe deeply and steadily. Wait until all of the symptoms are gone. This information is not intended to replace advice given to you by your health care provider. Make sure you discuss any questions you have with your health care provider. Document Revised: 11/12/2019 Document Reviewed: 11/14/2017 Elsevier Patient Education  2021 [employer] Inc.  

## 2020-12-28 NOTE — Progress Notes (Signed)
Discharge instructions (including medications) discussed with and copy provided to patient/caregiver 

## 2020-12-28 NOTE — Progress Notes (Signed)
Physical Therapy Treatment Patient Details Name: Brianna Gates MRN: [id number] DOB: Mar 29, 1951 Today's Date: 12/28/2020    History of Present Illness Pt is a 70 y/o female admitted 3/14 following syncopal episode that occured while visiting with her husband in the ED. Workup pending. PMH includes Guillan Barre, HTN, asthma, and Raynauds.    PT Comments    Pt agreeable to therapy, husband in room during session. Pt reports feeling somewhat better since yesterday. Pt with slight dizziness with positional change however dissipates quickly see BP below. Pt is supervision for bed mobility and min guard for transfers and ambulation of 300 feet without AD. D/c plans remain appropriate. PT will continue to follow acutely, however will likely discharge today.  Orthostatic BPs                                                           HR                          BP Supine 61 122/68  Sitting 59 122/88  Standing 75 142/67  Standing after 3 min 75 121/67  After ambulation  78 129/64      Follow Up Recommendations  No PT follow up     Equipment Recommendations  None recommended by PT    Recommendations for Other Services       Precautions / Restrictions Precautions Precautions: Fall Restrictions Weight Bearing Restrictions: No    Mobility  Bed Mobility Overal bed mobility: Needs Assistance Bed Mobility: Supine to Sit;Sit to Supine     Supine to sit: Supervision;HOB elevated     General bed mobility comments: supervision for safety with moving towards EoB with HoB elevated, reports slight dizziness but resloves quickly    Transfers Overall transfer level: Needs assistance Equipment used: None Transfers: Sit to/from Stand Sit to Stand: Min guard         General transfer comment: Min guard for safety able to power up and self steady. reports slight dizziness that resolves with standing  Pt reports incontinence with standing but has mesh panties and pad in  place,  Ambulation/Gait Ambulation/Gait assistance: Min guard Gait Distance (Feet): 350 Feet Assistive device: None;Rolling walker (2 wheeled) Gait Pattern/deviations: Step-through pattern;Decreased stance time - right Gait velocity: Decreased Gait velocity interpretation: 1.31 - 2.62 ft/sec, indicative of limited community ambulator General Gait Details: min guard for saftey, ambulated to bathroom with RW, after using bathroom without assist, washed hands and able to ambulate 300 feet in hallway without AD, pt reports peripheral neuropathy that leads to mildly unsteady gait with walking in sock feet, no overt LoB         Balance Overall balance assessment: Mild deficits observed, not formally tested                                          Cognition Arousal/Alertness: Awake/alert Behavior During Therapy: WFL for tasks assessed/performed Overall Cognitive Status: Within Functional Limits for tasks assessed  General Comments General comments (skin integrity, edema, etc.): VSS on RA      Pertinent Vitals/Pain Pain Assessment: No/denies pain    Home Living Family/patient expects to be discharged to:: Private residence Living Arrangements: Spouse/significant other                      PT Goals (current goals can now be found in the care plan section) Acute Rehab PT Goals Patient Stated Goal: to feel better and go home PT Goal Formulation: With patient Time For Goal Achievement: 01/10/21 Potential to Achieve Goals: Good Progress towards PT goals: Progressing toward goals    Frequency    Min 3X/week      PT Plan Current plan remains appropriate       AM-PAC PT "6 Clicks" Mobility   Outcome Measure  Help needed turning from your back to your side while in a flat bed without using bedrails?: None Help needed moving from lying on your back to sitting on the side of a flat bed  without using bedrails?: A Little Help needed moving to and from a bed to a chair (including a wheelchair)?: A Little Help needed standing up from a chair using your arms (e.g., wheelchair or bedside chair)?: A Little Help needed to walk in hospital room?: A Little Help needed climbing 3-5 steps with a railing? : A Lot 6 Click Score: 18    End of Session Equipment Utilized During Treatment: Gait belt Activity Tolerance: Treatment limited secondary to medical complications (Comment) (wooziness) Patient left: in bed;with call bell/phone within reach;with family/visitor present (on stretcher in ED) Nurse Communication: Mobility status PT Visit Diagnosis: Other abnormalities of gait and mobility (R26.89)     Time: 6629-4765 PT Time Calculation (min) (ACUTE ONLY): 35 min  Charges:  $Therapeutic Exercise: 8-22 mins $Therapeutic Activity: 8-22 mins                     [name] B. Migdalia Dk PT, DPT Acute Rehabilitation Services Pager [phone] Office [phone]    [place] 12/28/2020, 11:53 AM

## 2020-12-28 NOTE — Progress Notes (Signed)
  Echocardiogram 2D Echocardiogram has been performed.  Brianna Gates 12/28/2020, 1:53 PM

## 2020-12-28 NOTE — Plan of Care (Signed)
  Problem: Acute Rehab PT Goals(only PT should resolve) Goal: Pt Will Go Supine/Side To Sit Outcome: Adequate for Discharge Goal: Patient Will Transfer Sit To/From Stand Outcome: Adequate for Discharge Goal: Pt Will Ambulate Outcome: Adequate for Discharge Goal: Pt Will Go Up/Down Stairs Outcome: Adequate for Discharge

## 2020-12-29 DIAGNOSIS — M150 Primary generalized (osteo)arthritis: Secondary | ICD-10-CM | POA: Diagnosis not present

## 2020-12-29 DIAGNOSIS — I7300 Raynaud's syndrome without gangrene: Secondary | ICD-10-CM | POA: Diagnosis not present

## 2020-12-29 DIAGNOSIS — M3500 Sicca syndrome, unspecified: Secondary | ICD-10-CM | POA: Diagnosis not present

## 2020-12-29 DIAGNOSIS — Z6820 Body mass index (BMI) 20.0-20.9, adult: Secondary | ICD-10-CM | POA: Diagnosis not present

## 2020-12-31 DIAGNOSIS — I10 Essential (primary) hypertension: Secondary | ICD-10-CM | POA: Diagnosis not present

## 2020-12-31 DIAGNOSIS — E039 Hypothyroidism, unspecified: Secondary | ICD-10-CM | POA: Diagnosis not present

## 2020-12-31 DIAGNOSIS — H259 Unspecified age-related cataract: Secondary | ICD-10-CM | POA: Diagnosis not present

## 2020-12-31 DIAGNOSIS — R55 Syncope and collapse: Secondary | ICD-10-CM | POA: Diagnosis not present

## 2021-01-03 DIAGNOSIS — H25042 Posterior subcapsular polar age-related cataract, left eye: Secondary | ICD-10-CM | POA: Diagnosis not present

## 2021-01-06 DIAGNOSIS — J3089 Other allergic rhinitis: Secondary | ICD-10-CM | POA: Diagnosis not present

## 2021-01-11 DIAGNOSIS — H268 Other specified cataract: Secondary | ICD-10-CM | POA: Diagnosis not present

## 2021-01-11 DIAGNOSIS — H2512 Age-related nuclear cataract, left eye: Secondary | ICD-10-CM | POA: Diagnosis not present

## 2021-01-11 DIAGNOSIS — H25812 Combined forms of age-related cataract, left eye: Secondary | ICD-10-CM | POA: Diagnosis not present

## 2021-01-17 DIAGNOSIS — J3089 Other allergic rhinitis: Secondary | ICD-10-CM | POA: Diagnosis not present

## 2021-01-24 DIAGNOSIS — J3089 Other allergic rhinitis: Secondary | ICD-10-CM | POA: Diagnosis not present

## 2021-01-24 DIAGNOSIS — J301 Allergic rhinitis due to pollen: Secondary | ICD-10-CM | POA: Diagnosis not present

## 2021-01-31 ENCOUNTER — Other Ambulatory Visit: Payer: Self-pay

## 2021-01-31 ENCOUNTER — Ambulatory Visit: Payer: PPO | Admitting: Podiatry

## 2021-01-31 DIAGNOSIS — J3089 Other allergic rhinitis: Secondary | ICD-10-CM | POA: Diagnosis not present

## 2021-01-31 DIAGNOSIS — L600 Ingrowing nail: Secondary | ICD-10-CM | POA: Diagnosis not present

## 2021-01-31 DIAGNOSIS — M79674 Pain in right toe(s): Secondary | ICD-10-CM

## 2021-01-31 DIAGNOSIS — B351 Tinea unguium: Secondary | ICD-10-CM | POA: Diagnosis not present

## 2021-01-31 DIAGNOSIS — M79675 Pain in left toe(s): Secondary | ICD-10-CM

## 2021-02-01 DIAGNOSIS — L282 Other prurigo: Secondary | ICD-10-CM | POA: Diagnosis not present

## 2021-02-01 DIAGNOSIS — L309 Dermatitis, unspecified: Secondary | ICD-10-CM | POA: Diagnosis not present

## 2021-02-02 NOTE — Progress Notes (Signed)
Subjective: 70 year old female presents the office today for follow-up evaluation of ingrown toenail to her big toes nails becoming thickened discolored.  She states the big toenails get ingrown as they grow out and asking for them to be trimmed.  Denies any redness or drainage.  No swelling.  No other concerns today. Denies any redness or drainage or any swelling to the toenail sites.  She has no other concerns today.  Objective: AAO x3, NAD DP/PT pulses palpable bilaterally, CRT less than 3 seconds Mild incurvation to the hallux toenails bilaterally and the hallux nails are mildly hypertrophic, dystrophic.  No edema, erythema and signs of infection noted today.  No open lesions or pre-ulcerative lesions.  No pain with calf compression, swelling, warmth, erythema  Assessment: Chronic ingrown toenails, symptomatic onychomycosis without signs of infection.  Plan: -All treatment options discussed with the patient including all alternatives, risks, complications.  -Patient debrided nails x2 without any complications or bleeding.  Again discussed partial nail avulsion but states that she has other medical issues she needs to take care of prior to this.  Trula Slade DPM

## 2021-02-09 DIAGNOSIS — Z96651 Presence of right artificial knee joint: Secondary | ICD-10-CM | POA: Diagnosis not present

## 2021-02-10 DIAGNOSIS — J3089 Other allergic rhinitis: Secondary | ICD-10-CM | POA: Diagnosis not present

## 2021-02-21 DIAGNOSIS — J3089 Other allergic rhinitis: Secondary | ICD-10-CM | POA: Diagnosis not present

## 2021-03-07 DIAGNOSIS — J3089 Other allergic rhinitis: Secondary | ICD-10-CM | POA: Diagnosis not present

## 2021-03-09 ENCOUNTER — Telehealth: Payer: Self-pay | Admitting: Neurology

## 2021-03-09 NOTE — Telephone Encounter (Signed)
Pt called wanting to discuss her medication gabapentin (NEURONTIN) 100 MG capsule with the RN. Pt did go in to details on why with me. Please advise.

## 2021-03-09 NOTE — Telephone Encounter (Signed)
Spoke with patient she relayed that she wanted to come off the gabapentin which she is taking 300 mg p.o. nightly at this time. s\She just feels like she has some constipation and some bloating some lethargy no energy and has seen those as side effects of this medication although she has been on this for years for GBS.  I relayed that per Sarah's last note back in November 11, 2020 that they did talk about coming off of it so I just told her to take 2 capsules at night for a week or 2 and then see how she does and then she can go go down to 1 capsule at night and see how she does and then just come off it she has an appointment with Dr. Jannifer Franklin June 23 at 8:00 and will discuss then and will call us before if needed.

## 2021-03-15 ENCOUNTER — Other Ambulatory Visit (HOSPITAL_BASED_OUTPATIENT_CLINIC_OR_DEPARTMENT_OTHER): Payer: Self-pay

## 2021-03-15 ENCOUNTER — Other Ambulatory Visit: Payer: Self-pay

## 2021-03-15 ENCOUNTER — Ambulatory Visit: Payer: PPO | Attending: Internal Medicine

## 2021-03-15 DIAGNOSIS — Z23 Encounter for immunization: Secondary | ICD-10-CM

## 2021-03-15 MED ORDER — PFIZER-BIONT COVID-19 VAC-TRIS 30 MCG/0.3ML IM SUSP
INTRAMUSCULAR | 0 refills | Status: DC
  Filled 2021-03-15: qty 0.3, 1d supply, fill #0

## 2021-03-15 NOTE — Progress Notes (Signed)
   Covid-19 Vaccination Clinic  Name:  Brianna Gates    MRN: [id number] DOB: May 30, 1951  03/15/2021  Brianna Gates was observed post Covid-19 immunization for 15 minutes without incident. She was provided with Vaccine Information Sheet and instruction to access the V-Safe system.   Brianna Gates was instructed to call 911 with any severe reactions post vaccine: Marland Kitchen Difficulty breathing  . Swelling of face and throat  . A fast heartbeat  . A bad rash all over body  . Dizziness and weakness   Immunizations Administered    Name Date Dose VIS Date Route   PFIZER Comrnaty(Gray TOP) Covid-19 Vaccine 03/15/2021 11:45 AM 0.3 mL 09/23/2020 Intramuscular   Manufacturer: [employer]   Lot: [id number]   NDC: [phone]

## 2021-03-21 DIAGNOSIS — J3089 Other allergic rhinitis: Secondary | ICD-10-CM | POA: Diagnosis not present

## 2021-03-23 DIAGNOSIS — Z01419 Encounter for gynecological examination (general) (routine) without abnormal findings: Secondary | ICD-10-CM | POA: Diagnosis not present

## 2021-03-23 DIAGNOSIS — Z1231 Encounter for screening mammogram for malignant neoplasm of breast: Secondary | ICD-10-CM | POA: Diagnosis not present

## 2021-03-23 DIAGNOSIS — Z79890 Hormone replacement therapy (postmenopausal): Secondary | ICD-10-CM | POA: Diagnosis not present

## 2021-03-23 DIAGNOSIS — Z6821 Body mass index (BMI) 21.0-21.9, adult: Secondary | ICD-10-CM | POA: Diagnosis not present

## 2021-04-04 ENCOUNTER — Ambulatory Visit: Payer: PPO | Admitting: Podiatry

## 2021-04-04 ENCOUNTER — Encounter: Payer: Self-pay | Admitting: Podiatry

## 2021-04-04 ENCOUNTER — Other Ambulatory Visit: Payer: Self-pay

## 2021-04-04 DIAGNOSIS — L600 Ingrowing nail: Secondary | ICD-10-CM | POA: Diagnosis not present

## 2021-04-04 DIAGNOSIS — M79674 Pain in right toe(s): Secondary | ICD-10-CM | POA: Diagnosis not present

## 2021-04-04 DIAGNOSIS — J301 Allergic rhinitis due to pollen: Secondary | ICD-10-CM | POA: Insufficient documentation

## 2021-04-04 DIAGNOSIS — M79675 Pain in left toe(s): Secondary | ICD-10-CM

## 2021-04-04 DIAGNOSIS — J3081 Allergic rhinitis due to animal (cat) (dog) hair and dander: Secondary | ICD-10-CM | POA: Insufficient documentation

## 2021-04-04 DIAGNOSIS — B351 Tinea unguium: Secondary | ICD-10-CM | POA: Diagnosis not present

## 2021-04-04 DIAGNOSIS — J3089 Other allergic rhinitis: Secondary | ICD-10-CM | POA: Diagnosis not present

## 2021-04-04 DIAGNOSIS — IMO0002 Reserved for concepts with insufficient information to code with codable children: Secondary | ICD-10-CM | POA: Insufficient documentation

## 2021-04-04 DIAGNOSIS — J309 Allergic rhinitis, unspecified: Secondary | ICD-10-CM | POA: Insufficient documentation

## 2021-04-07 ENCOUNTER — Ambulatory Visit: Payer: PPO | Admitting: Neurology

## 2021-04-07 ENCOUNTER — Other Ambulatory Visit: Payer: Self-pay

## 2021-04-07 ENCOUNTER — Encounter: Payer: Self-pay | Admitting: Neurology

## 2021-04-07 VITALS — BP 121/56 | HR 69 | Ht 64.5 in | Wt 126.2 lb

## 2021-04-07 DIAGNOSIS — Z8669 Personal history of other diseases of the nervous system and sense organs: Secondary | ICD-10-CM | POA: Diagnosis not present

## 2021-04-07 DIAGNOSIS — R269 Unspecified abnormalities of gait and mobility: Secondary | ICD-10-CM

## 2021-04-07 MED ORDER — DULOXETINE HCL 20 MG PO CPEP: 20.0000 mg | ORAL_CAPSULE | Freq: Two times a day (BID) | ORAL | 2 refills | Status: DC

## 2021-04-07 NOTE — Patient Instructions (Signed)
We will go up to 20 mg twice a day of the cymbalta.  Cymbalta (duloxetine) is an antidepressant medication that is commonly used for peripheral neuropathy pain or for fibromyalgia pain. As with any antidepressant medication, worsening depression can be seen. This medication can potentially cause headache, dizziness, sexual dysfunction, or nausea. If any problems are noted on this medication, please contact our office.

## 2021-04-07 NOTE — Progress Notes (Signed)
Subjective: 70 year old female presents the office today for follow-up evaluation of ingrown toenail to her big toes nails becoming thickened discolored.  She states that she recently came back from the beach and her nails been hurting more.  She denies any swelling or redness or any drainage to the nails. Denies any redness or drainage or any swelling to the toenail sites.  She has no other concerns today.  Objective: AAO x3, NAD DP/PT pulses palpable bilaterally, CRT less than 3 seconds Mild incurvation to the hallux toenails bilaterally and the hallux nails are mildly hypertrophic, dystrophic.  No edema, erythema and signs of infection noted today.  The nails appear to be longer today which is likely part of the reason and increased discomfort. No open lesions or pre-ulcerative lesions.  No pain with calf compression, swelling, warmth, erythema  Assessment: Chronic ingrown toenails, symptomatic onychomycosis without signs of infection.  Plan: -All treatment options discussed with the patient including all alternatives, risks, complications.  -Patient debrided nails x2 without any complications or bleeding.  Again discussed partial nail avulsion but states that she has other medical issues she needs to take care of prior to this.  Trula Slade DPM

## 2021-04-07 NOTE — Progress Notes (Signed)
Reason for visit: History of Guillain-Barre syndrome  Brianna Gates is an 70 y.o. 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  The patient has had some residual numbness and tingling in the feet, she has some mild balance issues but she has not had any falls.  She recently had a redo of a knee replacement on the right, she has done well with recovery.  She recently had a COVID infection in January 2022.  The patient has recovered from this.  She has been on gabapentin for some of the discomfort in the feet, but she has had a lot of daytime drowsiness and some gait instability in the evening when she took the medication.  She has stopped the medication at this point.  She is on Cymbalta taking 20 mg daily but she does not believe this helps her symptoms.  Past Medical History:  Diagnosis Date   Allergy    Anxiety    Asthma    INFREQUENT PROBLEM - rarely used inhaler   Cervical spondylosis    Cervical strain 70/0/1749   Complication of anesthesia    UNKNOWN REACTION WITH RASH AT SITE OF IV only with knee surgery   Degenerative arthritis    Dyslipidemia    Eczema    GERD (gastroesophageal reflux disease)    Guillain-Barre syndrome (HCC)    History of Guillain-Barre syndrome 07/22/2014   Hyperlipidemia    Hypertension    Hypothyroidism    Lumbosacral spondylosis    Raynaud disease    Thyroid nodule     Past Surgical History:  Procedure Laterality Date   ABDOMINAL HYSTERECTOMY     COLONOSCOPY     FOOT SURGERY Left    GANGLION CYST EXCISION Left    Wrist   HERNIA REPAIR     KNEE ARTHROSCOPY     RT KNEE   KNEE ARTHROTOMY Right 08/27/2013   Procedure: RIGHT KNEE SCAR EXCISION AND FEMORAL REVISION;  Surgeon: Gearlean Alf, MD;  Location: [facility];  Service: Orthopedics;  Laterality: Right;   REPLACEMENT TOTAL KNEE Right    REPLACEMENT TOTAL KNEE BILATERAL Bilateral     STRABISMUS SURGERY Right    TONSILLECTOMY     vaginal vulvo prolapse  09/16/2015    Family History  Problem Relation Age of Onset   Hypertension Mother    Other Mother        Dyslipidemia   Heart disease Father    Other Brother        Dyslipidemia   Stomach cancer Paternal Uncle    Colon cancer Neg Hx    Rectal cancer Neg Hx    Esophageal cancer Neg Hx     Social history:  reports that she has never smoked. She has never used smokeless tobacco. She reports current alcohol use of about 6.0 standard drinks of alcohol per week. She reports that she does not use drugs.    Allergies  Allergen Reactions   Buspar [Buspirone] Itching    Vivid dreams, diarrhea    Captopril Other (See Comments)    reaction   Oxaprozin Hives and Other (See Comments)   Ramipril Cough and Other (See Comments)   Zoloft [Sertraline Hcl] Diarrhea   Ace Inhibitors     Other reaction(s): Other (See Comments) GB syndrome   Doxycycline Other (See Comments)    Burning sensation/Rash   Influenza Vaccines  HX    Sulfa Antibiotics Other (See Comments)    unknown    Medications:  Prior to Admission medications   Medication Sig Start Date End Date Taking? Authorizing Provider  acetaminophen (TYLENOL) 650 MG CR tablet Take 1,300 mg by mouth every 8 (eight) hours as needed for pain or fever.   Yes [provider]  albuterol (PROVENTIL HFA;VENTOLIN HFA) 108 (90 BASE) MCG/ACT inhaler Inhale 2 puffs into the lungs every 6 (six) hours as needed for wheezing or shortness of breath.   Yes [provider]  amLODipine (NORVASC) 5 MG tablet Take 1 tablet by mouth daily. 02/22/21  Yes [provider]  amoxicillin (AMOXIL) 500 MG capsule Take 2,000 mg by mouth once as needed (prior to dental appointments).   Yes [provider]  Ascorbic Acid (VITAMIN C PO) Take 1 tablet by mouth daily as needed (sick symptoms).   Yes [provider]  azelastine (ASTELIN) 0.1 % nasal spray  Place 1 spray into both nostrils 2 (two) times daily as needed (cold symptoms).   Yes [provider]  Cholecalciferol (VITAMIN D) 2000 UNITS CAPS Take 2,000 Units by mouth daily in the afternoon.   Yes [provider]  DULoxetine (CYMBALTA) 20 MG capsule Take 20 mg by mouth daily.   Yes [provider]  EPINEPHrine 0.3 mg/0.3 mL IJ SOAJ injection Inject 0.3 mg into the muscle once as needed for anaphylaxis. 11/21/19  Yes [provider]  estradiol (ESTRACE) 0.5 MG tablet Take 0.5 mg by mouth daily as needed (hot flashes).   Yes [provider]  fluticasone (FLONASE) 50 MCG/ACT nasal spray Place 1 spray into both nostrils daily.   Yes [provider]  gabapentin (NEURONTIN) 100 MG capsule TAKE ONE CAPSULE TWICE A DAY AND TAKE 4 CAPSULES AT BEDTIME Patient taking differently: Take 300 mg by mouth at bedtime. 12/14/20  Yes Suzzanne Cloud, NP  ibuprofen (ADVIL,MOTRIN) 100 MG tablet Take 200-600 mg by mouth 3 (three) times daily as needed for pain or fever.   Yes [provider]  levothyroxine (SYNTHROID) 50 MCG tablet Take 50 mcg by mouth daily before breakfast.    Yes [provider]  montelukast (SINGULAIR) 10 MG tablet Take 10 mg by mouth daily as needed for allergies. 09/21/20  Yes [provider]  PROLENSA 0.07 % SOLN Place 1 drop into the left eye daily. 01/06/21  Yes [provider]  rosuvastatin (CRESTOR) 5 MG tablet Take 5 mg by mouth every other day.   Yes [provider]  triamcinolone cream (KENALOG) 0.1 % as needed. 02/01/21  Yes [provider]  amLODipine (NORVASC) 2.5 MG tablet Take 1 tablet (2.5 mg total) by mouth daily. 12/28/20 02/26/21  Kayleen Memos, DO  Carboxymethylcellulose Sodium (EYE DROPS OP) Place 1 drop into both eyes See admin instructions. Place one drop in each eye every morning.  May take an additional dose in the evening as needed for dry eyes.    [provider]   COVID-19 mRNA Vac-TriS, Pfizer, (PFIZER-BIONT COVID-19 VAC-TRIS) SUSP injection Inject into the muscle. 03/15/21   Carlyle Basques, MD  losartan (COZAAR) 25 MG tablet Take 1 tablet (25 mg total) by mouth daily. 12/28/20 02/26/21  Kayleen Memos, DO  LOTEMAX SM 0.38 % GEL Place into the left eye. 01/06/21   [provider]    ROS:  Out of a complete 14 system review of symptoms, the patient complains only of the following symptoms, and  all other reviewed systems are negative.  Numbness in the feet Mild gait problems Joint pains  Blood pressure (!) 121/56, pulse 69, height 5' 4.5" (1.638 m), weight 126 lb 3.2 oz (57.2 kg).  Physical Exam  General: The patient is alert and cooperative at the time of the examination.  Skin: No significant peripheral edema is noted.   Neurologic Exam  Mental status: The patient is alert and oriented x 3 at the time of the examination. The patient has apparent normal recent and remote memory, with an apparently normal attention span and concentration ability.   Cranial nerves: Facial symmetry is present. Speech is normal, no aphasia or dysarthria is noted. Extraocular movements are full. Visual fields are full.  Motor: The patient has good strength in all 4 extremities.  Sensory examination: Soft touch sensation is symmetric on the face, arms, and legs.  Coordination: The patient has good finger-nose-finger and heel-to-shin bilaterally.  Gait and station: The patient has a normal gait. Tandem gait is normal. Romberg is negative. No drift is seen.  The patient is able to walk on heels and the toes bilaterally.  Reflexes: Deep tendon reflexes are symmetric.   Assessment/Plan:  1.  History of Guillain-Barre syndrome  The patient has some residual sensory alterations in the feet that she finds uncomfortable.  We will try going up on Cymbalta taking 20 mg twice daily, the patient will call me in the next 6 to 8 weeks if she is not getting good  improvement, will go to a 60 mg tablet once a day.  She will follow-up here in 6 months.  She may be seen through Dr. Krista Blue in the future.  Jill Alexanders MD 04/07/2021 8:20 AM  [facility] [address] [place], [place] [address]  Phone [phone] Fax [phone]

## 2021-04-20 DIAGNOSIS — J3089 Other allergic rhinitis: Secondary | ICD-10-CM | POA: Diagnosis not present

## 2021-05-02 DIAGNOSIS — J3089 Other allergic rhinitis: Secondary | ICD-10-CM | POA: Diagnosis not present

## 2021-05-02 DIAGNOSIS — R062 Wheezing: Secondary | ICD-10-CM | POA: Diagnosis not present

## 2021-05-10 DIAGNOSIS — J3089 Other allergic rhinitis: Secondary | ICD-10-CM | POA: Diagnosis not present

## 2021-05-17 DIAGNOSIS — J3089 Other allergic rhinitis: Secondary | ICD-10-CM | POA: Diagnosis not present

## 2021-05-23 DIAGNOSIS — J3089 Other allergic rhinitis: Secondary | ICD-10-CM | POA: Diagnosis not present

## 2021-06-01 DIAGNOSIS — J3089 Other allergic rhinitis: Secondary | ICD-10-CM | POA: Diagnosis not present

## 2021-06-06 ENCOUNTER — Other Ambulatory Visit: Payer: Self-pay

## 2021-06-06 ENCOUNTER — Encounter: Payer: Self-pay | Admitting: Podiatry

## 2021-06-06 ENCOUNTER — Ambulatory Visit (INDEPENDENT_AMBULATORY_CARE_PROVIDER_SITE_OTHER): Payer: PPO

## 2021-06-06 ENCOUNTER — Ambulatory Visit: Payer: PPO | Admitting: Podiatry

## 2021-06-06 DIAGNOSIS — M79675 Pain in left toe(s): Secondary | ICD-10-CM

## 2021-06-06 DIAGNOSIS — M79674 Pain in right toe(s): Secondary | ICD-10-CM

## 2021-06-06 DIAGNOSIS — B351 Tinea unguium: Secondary | ICD-10-CM

## 2021-06-06 DIAGNOSIS — Z969 Presence of functional implant, unspecified: Secondary | ICD-10-CM

## 2021-06-06 DIAGNOSIS — M7752 Other enthesopathy of left foot: Secondary | ICD-10-CM

## 2021-06-06 NOTE — Patient Instructions (Signed)

## 2021-06-07 NOTE — Progress Notes (Signed)
Subjective: 70 year old female presents the office today for follow-up evaluation of ingrown toenail to her big toes nails becoming thickened discolored.  Also she has noticed a spot on the left big toe, prominence of the hallux.  She has a history of the IPJ fusion she wants to make sure that the screws are not coming loose.  No injury that she reports no increase in swelling to the toe.  No open sores.   Objective: AAO x3, NAD DP/PT pulses palpable bilaterally, CRT less than 3 seconds Mild incurvation to the hallux toenails bilaterally and the hallux nails are mildly hypertrophic, dystrophic.  No edema, erythema and signs of infection noted today.   On the medial aspect the left hallux is a bony prominence.  There is no palpable hardware.  There is no edema, erythema.  No open lesions. No open lesions or pre-ulcerative lesions.  No pain with calf compression, swelling, warmth, erythema  Assessment: Chronic ingrown toenails, symptomatic onychomycosis without signs of infection; bone spur left hallux  Plan: -All treatment options discussed with the patient including all alternatives, risks, complications.  -Patient debrided nails x2 without any complications or bleeding.  She would like to hold off on partial nail avulsions. -X-rays obtained reviewed.  Hardware intact without any loosening.  There is a prominence, spurring off the medial phalanx base which is clinically which she is seeing.  Discussed offloading.  If becomes symptomatic consider surgical intervention but would recommend going up on that for now.  No follow-ups on file.  Trula Slade DPM

## 2021-06-09 DIAGNOSIS — J3089 Other allergic rhinitis: Secondary | ICD-10-CM | POA: Diagnosis not present

## 2021-06-15 DIAGNOSIS — J3089 Other allergic rhinitis: Secondary | ICD-10-CM | POA: Diagnosis not present

## 2021-06-27 ENCOUNTER — Ambulatory Visit: Payer: PPO

## 2021-06-28 ENCOUNTER — Other Ambulatory Visit (HOSPITAL_BASED_OUTPATIENT_CLINIC_OR_DEPARTMENT_OTHER): Payer: Self-pay

## 2021-06-28 DIAGNOSIS — E039 Hypothyroidism, unspecified: Secondary | ICD-10-CM | POA: Diagnosis not present

## 2021-06-28 DIAGNOSIS — E785 Hyperlipidemia, unspecified: Secondary | ICD-10-CM | POA: Diagnosis not present

## 2021-06-28 DIAGNOSIS — I10 Essential (primary) hypertension: Secondary | ICD-10-CM | POA: Diagnosis not present

## 2021-06-29 DIAGNOSIS — J3089 Other allergic rhinitis: Secondary | ICD-10-CM | POA: Diagnosis not present

## 2021-07-04 DIAGNOSIS — I7300 Raynaud's syndrome without gangrene: Secondary | ICD-10-CM | POA: Diagnosis not present

## 2021-07-04 DIAGNOSIS — Z6821 Body mass index (BMI) 21.0-21.9, adult: Secondary | ICD-10-CM | POA: Diagnosis not present

## 2021-07-04 DIAGNOSIS — M150 Primary generalized (osteo)arthritis: Secondary | ICD-10-CM | POA: Diagnosis not present

## 2021-07-04 DIAGNOSIS — M3500 Sicca syndrome, unspecified: Secondary | ICD-10-CM | POA: Diagnosis not present

## 2021-07-05 DIAGNOSIS — I10 Essential (primary) hypertension: Secondary | ICD-10-CM | POA: Diagnosis not present

## 2021-07-05 DIAGNOSIS — Z0000 Encounter for general adult medical examination without abnormal findings: Secondary | ICD-10-CM | POA: Diagnosis not present

## 2021-07-05 DIAGNOSIS — E039 Hypothyroidism, unspecified: Secondary | ICD-10-CM | POA: Diagnosis not present

## 2021-07-05 DIAGNOSIS — E785 Hyperlipidemia, unspecified: Secondary | ICD-10-CM | POA: Diagnosis not present

## 2021-07-05 DIAGNOSIS — I7300 Raynaud's syndrome without gangrene: Secondary | ICD-10-CM | POA: Diagnosis not present

## 2021-07-05 DIAGNOSIS — E041 Nontoxic single thyroid nodule: Secondary | ICD-10-CM | POA: Diagnosis not present

## 2021-07-05 DIAGNOSIS — R55 Syncope and collapse: Secondary | ICD-10-CM | POA: Diagnosis not present

## 2021-07-05 DIAGNOSIS — R002 Palpitations: Secondary | ICD-10-CM | POA: Diagnosis not present

## 2021-07-05 DIAGNOSIS — M3500 Sicca syndrome, unspecified: Secondary | ICD-10-CM | POA: Diagnosis not present

## 2021-07-05 DIAGNOSIS — G629 Polyneuropathy, unspecified: Secondary | ICD-10-CM | POA: Diagnosis not present

## 2021-07-05 DIAGNOSIS — R82998 Other abnormal findings in urine: Secondary | ICD-10-CM | POA: Diagnosis not present

## 2021-07-05 DIAGNOSIS — Z1212 Encounter for screening for malignant neoplasm of rectum: Secondary | ICD-10-CM | POA: Diagnosis not present

## 2021-07-05 DIAGNOSIS — M1990 Unspecified osteoarthritis, unspecified site: Secondary | ICD-10-CM | POA: Diagnosis not present

## 2021-07-05 DIAGNOSIS — N888 Other specified noninflammatory disorders of cervix uteri: Secondary | ICD-10-CM | POA: Diagnosis not present

## 2021-07-06 ENCOUNTER — Other Ambulatory Visit: Payer: Self-pay

## 2021-07-06 ENCOUNTER — Ambulatory Visit: Payer: PPO | Attending: Internal Medicine

## 2021-07-06 ENCOUNTER — Telehealth: Payer: Self-pay | Admitting: Neurology

## 2021-07-06 ENCOUNTER — Other Ambulatory Visit (HOSPITAL_BASED_OUTPATIENT_CLINIC_OR_DEPARTMENT_OTHER): Payer: Self-pay

## 2021-07-06 DIAGNOSIS — Z23 Encounter for immunization: Secondary | ICD-10-CM

## 2021-07-06 MED ORDER — DULOXETINE HCL 60 MG PO CPEP: 60.0000 mg | ORAL_CAPSULE | Freq: Every day | ORAL | 5 refills | Status: DC

## 2021-07-06 MED ORDER — PFIZER COVID-19 VAC BIVALENT 30 MCG/0.3ML IM SUSP
INTRAMUSCULAR | 0 refills | Status: DC
  Filled 2021-07-06: qty 0.3, 1d supply, fill #0

## 2021-07-06 NOTE — Telephone Encounter (Signed)
Approved.  

## 2021-07-06 NOTE — Progress Notes (Signed)
   Covid-19 Vaccination Clinic  Name:  ORIE CUTTINO    MRN: [id number] DOB: 1951-08-19  07/06/2021  Ms. Wisham was observed post Covid-19 immunization for 15 minutes without incident. She was provided with Vaccine Information Sheet and instruction to access the V-Safe system.   Ms. Mccready was instructed to call 911 with any severe reactions post vaccine: Difficulty breathing  Swelling of face and throat  A fast heartbeat  A bad rash all over body  Dizziness and weakness

## 2021-07-06 NOTE — Telephone Encounter (Signed)
Pt called requesting that her DULoxetine (CYMBALTA) 20 MG capsule be increased to 60 MG. Pharmacy [facility].

## 2021-07-08 ENCOUNTER — Other Ambulatory Visit: Payer: Self-pay | Admitting: Internal Medicine

## 2021-07-08 DIAGNOSIS — N888 Other specified noninflammatory disorders of cervix uteri: Secondary | ICD-10-CM

## 2021-07-11 ENCOUNTER — Telehealth: Payer: Self-pay | Admitting: Neurology

## 2021-07-11 ENCOUNTER — Ambulatory Visit
Admission: RE | Admit: 2021-07-11 | Discharge: 2021-07-11 | Disposition: A | Discharge status: Home / Self Care | Payer: PPO | Source: Ambulatory Visit | Attending: Internal Medicine | Admitting: Internal Medicine

## 2021-07-11 DIAGNOSIS — Z85831 Personal history of malignant neoplasm of soft tissue: Secondary | ICD-10-CM | POA: Diagnosis not present

## 2021-07-11 DIAGNOSIS — N888 Other specified noninflammatory disorders of cervix uteri: Secondary | ICD-10-CM

## 2021-07-11 DIAGNOSIS — J3089 Other allergic rhinitis: Secondary | ICD-10-CM | POA: Diagnosis not present

## 2021-07-11 NOTE — Telephone Encounter (Signed)
The patient has recently had blood work done through her primary care physician on 28 June 2021.  Chemistry panel revealed a glucose of 95, total bili of 0.6, creatinine of 0.8, estimated GFR of 70.9.  Sodium 135, potassium 4.7, chloride 100, CO2 27, calcium 9.2, total protein 6.4, albumin 4.1, liver profile was unremarkable, BUN of 16.  White blood count of 4.79, hemoglobin of 15.0, hematocrit 45.3, MCV 99.2, platelets of 176.  Total cholesterol 199, triglycerides of 125, HDL of 61, LDL 113, non-HDL is 138  TSH of 0.98

## 2021-07-20 DIAGNOSIS — R0989 Other specified symptoms and signs involving the circulatory and respiratory systems: Secondary | ICD-10-CM | POA: Diagnosis not present

## 2021-07-20 DIAGNOSIS — R1310 Dysphagia, unspecified: Secondary | ICD-10-CM | POA: Diagnosis not present

## 2021-07-20 DIAGNOSIS — J029 Acute pharyngitis, unspecified: Secondary | ICD-10-CM | POA: Diagnosis not present

## 2021-07-20 DIAGNOSIS — R5383 Other fatigue: Secondary | ICD-10-CM | POA: Diagnosis not present

## 2021-07-20 DIAGNOSIS — J3489 Other specified disorders of nose and nasal sinuses: Secondary | ICD-10-CM | POA: Diagnosis not present

## 2021-07-21 DIAGNOSIS — J3089 Other allergic rhinitis: Secondary | ICD-10-CM | POA: Diagnosis not present

## 2021-07-25 ENCOUNTER — Telehealth: Payer: Self-pay | Admitting: Neurology

## 2021-07-25 NOTE — Telephone Encounter (Signed)
I would like to recommend to Brianna Gates to follow-up for anxiety and treatment of anxiety with Marlowe Alt NP.  The office is located on [address] [place].

## 2021-08-02 DIAGNOSIS — J3089 Other allergic rhinitis: Secondary | ICD-10-CM | POA: Diagnosis not present

## 2021-08-08 ENCOUNTER — Other Ambulatory Visit: Payer: Self-pay

## 2021-08-08 ENCOUNTER — Encounter: Payer: Self-pay | Admitting: Podiatry

## 2021-08-08 ENCOUNTER — Ambulatory Visit: Payer: PPO | Admitting: Podiatry

## 2021-08-08 DIAGNOSIS — M79674 Pain in right toe(s): Secondary | ICD-10-CM

## 2021-08-08 DIAGNOSIS — L600 Ingrowing nail: Secondary | ICD-10-CM | POA: Diagnosis not present

## 2021-08-08 DIAGNOSIS — B351 Tinea unguium: Secondary | ICD-10-CM

## 2021-08-08 DIAGNOSIS — M79675 Pain in left toe(s): Secondary | ICD-10-CM | POA: Diagnosis not present

## 2021-08-10 DIAGNOSIS — J3089 Other allergic rhinitis: Secondary | ICD-10-CM | POA: Diagnosis not present

## 2021-08-11 ENCOUNTER — Telehealth: Payer: Self-pay | Admitting: Neurology

## 2021-08-11 DIAGNOSIS — Z8669 Personal history of other diseases of the nervous system and sense organs: Secondary | ICD-10-CM

## 2021-08-11 DIAGNOSIS — F419 Anxiety disorder, unspecified: Secondary | ICD-10-CM

## 2021-08-11 NOTE — Telephone Encounter (Signed)
Pt has not been seen by Dr Brett Fairy since 2020. She is still established with Dr Jannifer Franklin. I will place the referral for the patient. Pt must follow up with Dr Brett Fairy for any further needs.

## 2021-08-11 NOTE — Telephone Encounter (Signed)
Pt states that when her husband(which is a pt of Dr Brett Fairy) last saw Dr Brett Fairy he mentioned to her a concern pt (Brianna Genasis Zingale) has.  The husband according to pt was told by Dr Brett Fairy that pt should see Synetta Shadow for her anxiety.  Pt (Brianna Gates) is calling to report that a referral is needed from Dr Brett Fairy.  Please call.

## 2021-08-14 NOTE — Progress Notes (Signed)
Subjective: 70 year old female presents the office today for follow-up evaluation of ingrown toenail to her big toes nails becoming thickened discolored.  She denies any swelling redness or any drainage to the toenail sites.  Objective: AAO x3, NAD DP/PT pulses palpable bilaterally, CRT less than 3 seconds Mild incurvation to the hallux toenails bilaterally and the hallux nails are mildly hypertrophic, dystrophic.  No edema, erythema and signs of infection noted today.   No open lesions or pre-ulcerative lesions.  No pain with calf compression, swelling, warmth, erythema  Assessment: Chronic ingrown toenails, symptomatic onychomycosis without signs of infection  Plan: -All treatment options discussed with the patient including all alternatives, risks, complications.  -Patient debrided nails x2 without any complications or bleeding.  She would like to hold off on partial nail avulsions. -Daily foot inspection.  Monitoring signs or symptoms of infection.  Return in about 9 weeks (around 10/10/2021) for ingrown toenail.  Trula Slade DPM

## 2021-08-15 NOTE — Telephone Encounter (Signed)
Referral sent to [facility] for Brianna Gates. Phone: [phone].

## 2021-08-21 DIAGNOSIS — J019 Acute sinusitis, unspecified: Secondary | ICD-10-CM | POA: Diagnosis not present

## 2021-08-21 DIAGNOSIS — Z20822 Contact with and (suspected) exposure to covid-19: Secondary | ICD-10-CM | POA: Diagnosis not present

## 2021-08-30 ENCOUNTER — Ambulatory Visit: Payer: PPO | Admitting: Internal Medicine

## 2021-08-30 ENCOUNTER — Encounter: Payer: Self-pay | Admitting: Internal Medicine

## 2021-08-30 VITALS — BP 126/70 | HR 80 | Wt 130.5 lb

## 2021-08-30 DIAGNOSIS — K5900 Constipation, unspecified: Secondary | ICD-10-CM | POA: Diagnosis not present

## 2021-08-30 DIAGNOSIS — R140 Abdominal distension (gaseous): Secondary | ICD-10-CM

## 2021-08-30 DIAGNOSIS — R1319 Other dysphagia: Secondary | ICD-10-CM | POA: Diagnosis not present

## 2021-08-30 DIAGNOSIS — K219 Gastro-esophageal reflux disease without esophagitis: Secondary | ICD-10-CM

## 2021-08-30 NOTE — Patient Instructions (Signed)
If you are age 71 or older, your body mass index should be between 23-30. Your Body mass index is 22.05 kg/m. If this is out of the aforementioned range listed, please consider follow up with your Primary Care Provider.  If you are age 57 or younger, your body mass index should be between 19-25. Your Body mass index is 22.05 kg/m. If this is out of the aformentioned range listed, please consider follow up with your Primary Care Provider.   ________________________________________________________  The  GI providers would like to encourage you to use [facility] to communicate with providers for non-urgent requests or questions.  Due to long hold times on the telephone, sending your provider a message by [facility] may be a faster and more efficient way to get a response.  Please allow 48 business hours for a response.  Please remember that this is for non-urgent requests.  _______________________________________________________  Make sure to take your Colace regularly.  You have been scheduled for an endoscopy. Please follow written instructions given to you at your visit today. If you use inhalers (even only as needed), please bring them with you on the day of your procedure.

## 2021-08-30 NOTE — Progress Notes (Signed)
HISTORY OF PRESENT ILLNESS:  Brianna Gates is a 70 y.o. female with past medical history as listed below who presents today regarding possible reflux and dysphagia.  Patient was last seen September 2016 for routine screening colonoscopy.  This was normal.  She tells me that she has been experiencing intermittent problems with pharyngeal burning and a globus type sensation.  She tried Nexium 20 mg daily for 1 month.  No change in symptoms.  She does describe what sounds like occasional mild intermittent solid food dysphagia.  Next, she mentions problems with constipation.  Colace helps.  She does not want to take this regularly, though tolerates it well.  She also has abdominal bloating.  She is been taking over-the-counter anti-gas agents without significant benefit.  GI review of systems is otherwise negative.  She does bring with her a paper from an anesthesiologist stating that she is a difficult intubation.  REVIEW OF SYSTEMS:  All non-GI ROS negative unless otherwise stated in the HPI.  Past Medical History:  Diagnosis Date   Allergy    Anxiety    Asthma    INFREQUENT PROBLEM - rarely used inhaler   Cervical spondylosis    Cervical strain 41/12/2438   Complication of anesthesia    UNKNOWN REACTION WITH RASH AT SITE OF IV only with knee surgery   Degenerative arthritis    Dyslipidemia    Eczema    GERD (gastroesophageal reflux disease)    Guillain-Barre syndrome (HCC)    History of Guillain-Barre syndrome 07/22/2014   Hyperlipidemia    Hypertension    Hypothyroidism    Lumbosacral spondylosis    Raynaud disease    Thyroid nodule     Past Surgical History:  Procedure Laterality Date   ABDOMINAL HYSTERECTOMY     COLONOSCOPY     FOOT SURGERY Left    GANGLION CYST EXCISION Left    Wrist   HERNIA REPAIR     KNEE ARTHROSCOPY     RT KNEE   KNEE ARTHROTOMY Right 08/27/2013   Procedure: RIGHT KNEE SCAR EXCISION AND FEMORAL REVISION;  Surgeon: Gearlean Alf, MD;  Location:  [facility];  Service: Orthopedics;  Laterality: Right;   REPLACEMENT TOTAL KNEE Right    2014, 2021   REPLACEMENT TOTAL KNEE BILATERAL Bilateral    STRABISMUS SURGERY Right    TONSILLECTOMY     vaginal vulvo prolapse  09/16/2015    Social History Brianna Gates  reports that she has never smoked. She has never used smokeless tobacco. She reports current alcohol use of about 6.0 standard drinks per week. She reports that she does not use drugs.  family history includes Heart disease in her father; Hypertension in her mother; Other in her brother and mother; Stomach cancer in her paternal uncle.  Allergies  Allergen Reactions   Buspar [Buspirone] Itching    Vivid dreams, diarrhea    Captopril Other (See Comments)    reaction   Oxaprozin Hives and Other (See Comments)   Ramipril Cough and Other (See Comments)   Zoloft [Sertraline Hcl] Diarrhea   Ace Inhibitors     Other reaction(s): Other (See Comments) GB syndrome   Doxycycline Other (See Comments)    Burning sensation/Rash   Influenza Vaccines     HX    Sulfa Antibiotics Other (See Comments)    unknown       PHYSICAL EXAMINATION: Vital signs: Pulse 80   Wt 130 lb 8 oz (59.2 kg)   SpO2 99%  BMI 22.05 kg/m   Constitutional: generally well-appearing, no acute distress Psychiatric: alert and oriented x3, cooperative Eyes: extraocular movements intact, anicteric, conjunctiva pink Mouth: oral pharynx moist, no lesions Neck: supple no lymphadenopathy Cardiovascular: heart regular rate and rhythm, no murmur Lungs: clear to auscultation bilaterally Abdomen: soft, nontender, nondistended, no obvious ascites, no peritoneal signs, normal bowel sounds, no organomegaly Rectal: Omitted Extremities: no clubbing, cyanosis, or lower extremity edema bilaterally Skin: no lesions on visible extremities Neuro: No focal deficits.  Cranial nerves intact  ASSESSMENT:  1.  Pharyngeal burning and globus type sensation.  No improvement  on Nexium 20 mg daily x4 weeks. 2.  Vague esophageal dysphagia 3.  Chronic constipation 4.  Abdominal bloating 5.  Normal screening colonoscopy 2016   PLAN:  1.  Reflux precautions 2.  Continue PPI 3.  Schedule upper endoscopy with possible esophageal dilation.  This will be performed at the hospital given her history of difficult intubation.The nature of the procedure, as well as the risks, benefits, and alternatives were carefully and thoroughly reviewed with the patient. Ample time for discussion and questions allowed. The patient understood, was satisfied, and agreed to proceed. 4.  Recommended that she take Colace more regularly.  We will see if this helps her constipation as well as bloating. 5.  Routine screening colonoscopy around September 2026

## 2021-08-30 NOTE — H&P (View-Only) (Signed)
HISTORY OF PRESENT ILLNESS:  Brianna Gates is a 70 y.o. female with past medical history as listed below who presents today regarding possible reflux and dysphagia.  Patient was last seen September 2016 for routine screening colonoscopy.  This was normal.  She tells me that she has been experiencing intermittent problems with pharyngeal burning and a globus type sensation.  She tried Nexium 20 mg daily for 1 month.  No change in symptoms.  She does describe what sounds like occasional mild intermittent solid food dysphagia.  Next, she mentions problems with constipation.  Colace helps.  She does not want to take this regularly, though tolerates it well.  She also has abdominal bloating.  She is been taking over-the-counter anti-gas agents without significant benefit.  GI review of systems is otherwise negative.  She does bring with her a paper from an anesthesiologist stating that she is a difficult intubation.  REVIEW OF SYSTEMS:  All non-GI ROS negative unless otherwise stated in the HPI.  Past Medical History:  Diagnosis Date   Allergy    Anxiety    Asthma    INFREQUENT PROBLEM - rarely used inhaler   Cervical spondylosis    Cervical strain 04/20/2262   Complication of anesthesia    UNKNOWN REACTION WITH RASH AT SITE OF IV only with knee surgery   Degenerative arthritis    Dyslipidemia    Eczema    GERD (gastroesophageal reflux disease)    Guillain-Barre syndrome (HCC)    History of Guillain-Barre syndrome 07/22/2014   Hyperlipidemia    Hypertension    Hypothyroidism    Lumbosacral spondylosis    Raynaud disease    Thyroid nodule     Past Surgical History:  Procedure Laterality Date   ABDOMINAL HYSTERECTOMY     COLONOSCOPY     FOOT SURGERY Left    GANGLION CYST EXCISION Left    Wrist   HERNIA REPAIR     KNEE ARTHROSCOPY     RT KNEE   KNEE ARTHROTOMY Right 08/27/2013   Procedure: RIGHT KNEE SCAR EXCISION AND FEMORAL REVISION;  Surgeon: Gearlean Alf, MD;  Location:  [facility];  Service: Orthopedics;  Laterality: Right;   REPLACEMENT TOTAL KNEE Right    2014, 2021   REPLACEMENT TOTAL KNEE BILATERAL Bilateral    STRABISMUS SURGERY Right    TONSILLECTOMY     vaginal vulvo prolapse  09/16/2015    Social History Brianna Gates  reports that she has never smoked. She has never used smokeless tobacco. She reports current alcohol use of about 6.0 standard drinks per week. She reports that she does not use drugs.  family history includes Heart disease in her father; Hypertension in her mother; Other in her brother and mother; Stomach cancer in her paternal uncle.  Allergies  Allergen Reactions   Buspar [Buspirone] Itching    Vivid dreams, diarrhea    Captopril Other (See Comments)    reaction   Oxaprozin Hives and Other (See Comments)   Ramipril Cough and Other (See Comments)   Zoloft [Sertraline Hcl] Diarrhea   Ace Inhibitors     Other reaction(s): Other (See Comments) GB syndrome   Doxycycline Other (See Comments)    Burning sensation/Rash   Influenza Vaccines     HX    Sulfa Antibiotics Other (See Comments)    unknown       PHYSICAL EXAMINATION: Vital signs: Pulse 80   Wt 130 lb 8 oz (59.2 kg)   SpO2 99%  BMI 22.05 kg/m   Constitutional: generally well-appearing, no acute distress Psychiatric: alert and oriented x3, cooperative Eyes: extraocular movements intact, anicteric, conjunctiva pink Mouth: oral pharynx moist, no lesions Neck: supple no lymphadenopathy Cardiovascular: heart regular rate and rhythm, no murmur Lungs: clear to auscultation bilaterally Abdomen: soft, nontender, nondistended, no obvious ascites, no peritoneal signs, normal bowel sounds, no organomegaly Rectal: Omitted Extremities: no clubbing, cyanosis, or lower extremity edema bilaterally Skin: no lesions on visible extremities Neuro: No focal deficits.  Cranial nerves intact  ASSESSMENT:  1.  Pharyngeal burning and globus type sensation.  No improvement  on Nexium 20 mg daily x4 weeks. 2.  Vague esophageal dysphagia 3.  Chronic constipation 4.  Abdominal bloating 5.  Normal screening colonoscopy 2016   PLAN:  1.  Reflux precautions 2.  Continue PPI 3.  Schedule upper endoscopy with possible esophageal dilation.  This will be performed at the hospital given her history of difficult intubation.The nature of the procedure, as well as the risks, benefits, and alternatives were carefully and thoroughly reviewed with the patient. Ample time for discussion and questions allowed. The patient understood, was satisfied, and agreed to proceed. 4.  Recommended that she take Colace more regularly.  We will see if this helps her constipation as well as bloating. 5.  Routine screening colonoscopy around September 2026

## 2021-08-31 DIAGNOSIS — H5213 Myopia, bilateral: Secondary | ICD-10-CM | POA: Diagnosis not present

## 2021-08-31 DIAGNOSIS — R051 Acute cough: Secondary | ICD-10-CM | POA: Diagnosis not present

## 2021-08-31 DIAGNOSIS — J0100 Acute maxillary sinusitis, unspecified: Secondary | ICD-10-CM | POA: Diagnosis not present

## 2021-08-31 DIAGNOSIS — H43813 Vitreous degeneration, bilateral: Secondary | ICD-10-CM | POA: Diagnosis not present

## 2021-08-31 DIAGNOSIS — H3561 Retinal hemorrhage, right eye: Secondary | ICD-10-CM | POA: Diagnosis not present

## 2021-08-31 DIAGNOSIS — J309 Allergic rhinitis, unspecified: Secondary | ICD-10-CM | POA: Diagnosis not present

## 2021-08-31 DIAGNOSIS — H0102A Squamous blepharitis right eye, upper and lower eyelids: Secondary | ICD-10-CM | POA: Diagnosis not present

## 2021-08-31 DIAGNOSIS — H524 Presbyopia: Secondary | ICD-10-CM | POA: Diagnosis not present

## 2021-08-31 DIAGNOSIS — Z1152 Encounter for screening for COVID-19: Secondary | ICD-10-CM | POA: Diagnosis not present

## 2021-08-31 DIAGNOSIS — H52223 Regular astigmatism, bilateral: Secondary | ICD-10-CM | POA: Diagnosis not present

## 2021-08-31 DIAGNOSIS — H25811 Combined forms of age-related cataract, right eye: Secondary | ICD-10-CM | POA: Diagnosis not present

## 2021-09-05 DIAGNOSIS — R7301 Impaired fasting glucose: Secondary | ICD-10-CM | POA: Diagnosis not present

## 2021-09-05 DIAGNOSIS — E039 Hypothyroidism, unspecified: Secondary | ICD-10-CM | POA: Diagnosis not present

## 2021-09-06 DIAGNOSIS — J3089 Other allergic rhinitis: Secondary | ICD-10-CM | POA: Diagnosis not present

## 2021-09-07 DIAGNOSIS — M1710 Unilateral primary osteoarthritis, unspecified knee: Secondary | ICD-10-CM | POA: Diagnosis not present

## 2021-09-07 DIAGNOSIS — Z96651 Presence of right artificial knee joint: Secondary | ICD-10-CM | POA: Diagnosis not present

## 2021-09-07 DIAGNOSIS — Z96659 Presence of unspecified artificial knee joint: Secondary | ICD-10-CM | POA: Diagnosis not present

## 2021-09-07 DIAGNOSIS — G610 Guillain-Barre syndrome: Secondary | ICD-10-CM | POA: Diagnosis not present

## 2021-09-07 DIAGNOSIS — T84038D Mechanical loosening of other internal prosthetic joint, subsequent encounter: Secondary | ICD-10-CM | POA: Diagnosis not present

## 2021-09-07 DIAGNOSIS — M24661 Ankylosis, right knee: Secondary | ICD-10-CM | POA: Diagnosis not present

## 2021-09-12 DIAGNOSIS — R7301 Impaired fasting glucose: Secondary | ICD-10-CM | POA: Diagnosis not present

## 2021-09-12 DIAGNOSIS — E049 Nontoxic goiter, unspecified: Secondary | ICD-10-CM | POA: Diagnosis not present

## 2021-09-12 DIAGNOSIS — E7800 Pure hypercholesterolemia, unspecified: Secondary | ICD-10-CM | POA: Diagnosis not present

## 2021-09-12 DIAGNOSIS — I10 Essential (primary) hypertension: Secondary | ICD-10-CM | POA: Diagnosis not present

## 2021-09-12 DIAGNOSIS — E039 Hypothyroidism, unspecified: Secondary | ICD-10-CM | POA: Diagnosis not present

## 2021-09-13 ENCOUNTER — Other Ambulatory Visit: Payer: Self-pay

## 2021-09-13 ENCOUNTER — Encounter (HOSPITAL_COMMUNITY)
Admission: RE | Disposition: A | Discharge status: Home / Self Care | Payer: Self-pay | Source: Ambulatory Visit | Attending: Internal Medicine

## 2021-09-13 ENCOUNTER — Ambulatory Visit (HOSPITAL_COMMUNITY): Payer: PPO | Admitting: Certified Registered"

## 2021-09-13 ENCOUNTER — Encounter (HOSPITAL_COMMUNITY): Payer: Self-pay | Admitting: Internal Medicine

## 2021-09-13 ENCOUNTER — Ambulatory Visit (HOSPITAL_COMMUNITY)
Admission: RE | Admit: 2021-09-13 | Discharge: 2021-09-13 | Disposition: A | Discharge status: Home / Self Care | Payer: PPO | Source: Ambulatory Visit | Attending: Internal Medicine | Admitting: Internal Medicine

## 2021-09-13 DIAGNOSIS — K222 Esophageal obstruction: Secondary | ICD-10-CM | POA: Diagnosis not present

## 2021-09-13 DIAGNOSIS — K449 Diaphragmatic hernia without obstruction or gangrene: Secondary | ICD-10-CM | POA: Diagnosis not present

## 2021-09-13 DIAGNOSIS — K2100 Gastro-esophageal reflux disease with esophagitis, without bleeding: Secondary | ICD-10-CM | POA: Diagnosis not present

## 2021-09-13 DIAGNOSIS — I10 Essential (primary) hypertension: Secondary | ICD-10-CM | POA: Diagnosis not present

## 2021-09-13 DIAGNOSIS — G4733 Obstructive sleep apnea (adult) (pediatric): Secondary | ICD-10-CM | POA: Diagnosis not present

## 2021-09-13 DIAGNOSIS — R1310 Dysphagia, unspecified: Secondary | ICD-10-CM

## 2021-09-13 DIAGNOSIS — K2970 Gastritis, unspecified, without bleeding: Secondary | ICD-10-CM | POA: Insufficient documentation

## 2021-09-13 DIAGNOSIS — E039 Hypothyroidism, unspecified: Secondary | ICD-10-CM | POA: Insufficient documentation

## 2021-09-13 DIAGNOSIS — K219 Gastro-esophageal reflux disease without esophagitis: Secondary | ICD-10-CM

## 2021-09-13 DIAGNOSIS — J45909 Unspecified asthma, uncomplicated: Secondary | ICD-10-CM | POA: Diagnosis not present

## 2021-09-13 DIAGNOSIS — R1319 Other dysphagia: Secondary | ICD-10-CM

## 2021-09-13 HISTORY — PX: ESOPHAGOGASTRODUODENOSCOPY (EGD) WITH PROPOFOL: SHX5813

## 2021-09-13 SURGERY — ESOPHAGOGASTRODUODENOSCOPY (EGD) WITH PROPOFOL
Anesthesia: Monitor Anesthesia Care

## 2021-09-13 MED ORDER — LACTATED RINGERS IV SOLN
INTRAVENOUS | Status: DC
  Administered 2021-09-13: 1000 mL via INTRAVENOUS

## 2021-09-13 MED ORDER — PROPOFOL 500 MG/50ML IV EMUL
INTRAVENOUS | Status: AC
  Filled 2021-09-13: qty 50

## 2021-09-13 MED ORDER — PANTOPRAZOLE SODIUM 40 MG PO TBEC: 40.0000 mg | DELAYED_RELEASE_TABLET | Freq: Two times a day (BID) | ORAL | 11 refills | Status: DC

## 2021-09-13 MED ORDER — LIDOCAINE 2% (20 MG/ML) 5 ML SYRINGE
INTRAMUSCULAR | Status: DC | PRN
  Administered 2021-09-13: 40 mg via INTRAVENOUS

## 2021-09-13 MED ORDER — SODIUM CHLORIDE 0.9 % IV SOLN: INTRAVENOUS | Status: DC

## 2021-09-13 MED ORDER — PROPOFOL 10 MG/ML IV BOLUS
INTRAVENOUS | Status: DC | PRN
  Administered 2021-09-13: 20 mg via INTRAVENOUS

## 2021-09-13 MED ORDER — PROPOFOL 10 MG/ML IV BOLUS
INTRAVENOUS | Status: AC
  Filled 2021-09-13: qty 20

## 2021-09-13 MED ORDER — PROPOFOL 500 MG/50ML IV EMUL
INTRAVENOUS | Status: DC | PRN
  Administered 2021-09-13: 150 ug/kg/min via INTRAVENOUS

## 2021-09-13 SURGICAL SUPPLY — 15 items

## 2021-09-13 NOTE — Transfer of Care (Signed)
Immediate Anesthesia Transfer of Care Note  Patient: SABRINA KEOUGH  Procedure(s) Performed: ESOPHAGOGASTRODUODENOSCOPY (EGD) WITH PROPOFOL  Patient Location: PACU and Endoscopy Unit  Anesthesia Type:MAC  Level of Consciousness: awake, alert  and patient cooperative  Airway & Oxygen Therapy: Patient Spontanous Breathing and Patient connected to face mask oxygen  Post-op Assessment: Report given to RN and Post -op Vital signs reviewed and stable  Post vital signs: Reviewed and stable  Last Vitals:  Vitals Value Taken Time  BP 123/57 09/13/21 1010  Temp    Pulse 57 09/13/21 1012  Resp 19 09/13/21 1012  SpO2 100 % 09/13/21 1012  Vitals shown include unvalidated device data.  Last Pain:  Vitals:   09/13/21 1010  TempSrc:   PainSc: 0-No pain         Complications: No notable events documented.

## 2021-09-13 NOTE — Anesthesia Preprocedure Evaluation (Addendum)
Anesthesia Evaluation  Patient identified by MRN, date of birth, ID band Patient awake    Reviewed: Allergy & Precautions, NPO status , Patient's Chart, lab work & pertinent test results  Airway Mallampati: II  TM Distance: >3 FB     Dental   Pulmonary asthma , sleep apnea ,    breath sounds clear to auscultation       Cardiovascular hypertension,  Rhythm:Regular Rate:Normal     Neuro/Psych  Neuromuscular disease    GI/Hepatic Neg liver ROS, GERD  ,  Endo/Other  Hypothyroidism   Renal/GU negative Renal ROS     Musculoskeletal   Abdominal   Peds  Hematology   Anesthesia Other Findings   Reproductive/Obstetrics                             Anesthesia Physical Anesthesia Plan  ASA: 3  Anesthesia Plan: MAC   Post-op Pain Management:    Induction: Intravenous  PONV Risk Score and Plan: 2 and Ondansetron, Dexamethasone and Midazolam  Airway Management Planned: Nasal Cannula and Simple Face Mask  Additional Equipment:   Intra-op Plan:   Post-operative Plan:   Informed Consent: I have reviewed the patients History and Physical, chart, labs and discussed the procedure including the risks, benefits and alternatives for the proposed anesthesia with the patient or authorized representative who has indicated his/her understanding and acceptance.     Dental advisory given  Plan Discussed with: CRNA and Anesthesiologist  Anesthesia Plan Comments:         Anesthesia Quick Evaluation

## 2021-09-13 NOTE — Anesthesia Procedure Notes (Signed)
Procedure Name: MAC Date/Time: 09/13/2021 9:46 AM Performed by: Eben Burow, CRNA Pre-anesthesia Checklist: Patient identified, Emergency Drugs available, Suction available, Patient being monitored and Timeout performed Oxygen Delivery Method: Simple face mask Placement Confirmation: positive ETCO2

## 2021-09-13 NOTE — Discharge Instructions (Signed)
YOU HAD AN ENDOSCOPIC PROCEDURE TODAY: Refer to the procedure report and other information in the discharge instructions given to you for any specific questions about what was found during the examination. If this information does not answer your questions, please call [place] office at [phone] to clarify.  ° °YOU SHOULD EXPECT: Some feelings of bloating in the abdomen. Passage of more gas than usual. Walking can help get rid of the air that was put into your GI tract during the procedure and reduce the bloating. If you had a lower endoscopy (such as a colonoscopy or flexible sigmoidoscopy) you may notice spotting of blood in your stool or on the toilet paper. Some abdominal soreness may be present for a day or two, also. ° °DIET: Your first meal following the procedure should be a light meal and then it is ok to progress to your normal diet. A half-sandwich or bowl of soup is an example of a good first meal. Heavy or fried foods are harder to digest and may make you feel nauseous or bloated. Drink plenty of fluids but you should avoid alcoholic beverages for 24 hours. If you had a esophageal dilation, please see attached instructions for diet.   ° °ACTIVITY: Your care partner should take you home directly after the procedure. You should plan to take it easy, moving slowly for the rest of the day. You can resume normal activity the day after the procedure however YOU SHOULD NOT DRIVE, use power tools, machinery or perform tasks that involve climbing or major physical exertion for 24 hours (because of the sedation medicines used during the test).  ° °SYMPTOMS TO REPORT IMMEDIATELY: °A gastroenterologist can be reached at any hour. Please call [phone]  for any of the following symptoms:  °Following lower endoscopy (colonoscopy, flexible sigmoidoscopy) °Excessive amounts of blood in the stool  °Significant tenderness, worsening of abdominal pains  °Swelling of the abdomen that is new, acute  °Fever of 100° or  higher  °Following upper endoscopy (EGD, EUS, ERCP, esophageal dilation) °Vomiting of blood or coffee ground material  °New, significant abdominal pain  °New, significant chest pain or pain under the shoulder blades  °Painful or persistently difficult swallowing  °New shortness of breath  °Black, tarry-looking or red, bloody stools ° °FOLLOW UP:  °If any biopsies were taken you will be contacted by phone or by letter within the next 1-3 weeks. Call [phone]  if you have not heard about the biopsies in 3 weeks.  °Please also call with any specific questions about appointments or follow up tests. ° °

## 2021-09-13 NOTE — Op Note (Signed)
[facility] Patient Name: Brianna Gates Procedure Date: 09/13/2021 MRN: [id number] Attending MD: Docia Chuck. Henrene Pastor , MD Date of Birth: [birth year] CSN: [id number] Age: 70 Admit Type: Outpatient Procedure:                Upper GI endoscopy Indications:              Dysphagia Providers:                Docia Chuck. Henrene Pastor, MD, Particia Nearing, RN, Tyna Jaksch                            Technician Referring MD:             Prince Solian MD Medicines:                Monitored Anesthesia Care Complications:            No immediate complications. Estimated Blood Loss:     Estimated blood loss: none. Procedure:                Pre-Anesthesia Assessment:                           - Prior to the procedure, a History and Physical                            was performed, and patient medications and                            allergies were reviewed. The patient's tolerance of                            previous anesthesia was also reviewed. The risks                            and benefits of the procedure and the sedation                            options and risks were discussed with the patient.                            All questions were answered, and informed consent                            was obtained. Prior Anticoagulants: The patient has                            taken no previous anticoagulant or antiplatelet                            agents. ASA Grade Assessment: II - A patient with                            mild systemic disease. After reviewing the risks  and benefits, the patient was deemed in                            satisfactory condition to undergo the procedure.                           After obtaining informed consent, the endoscope was                            passed under direct vision. Throughout the                            procedure, the patient's blood pressure, pulse, and                            oxygen saturations  were monitored continuously. The                            [id number]-[id number] ([id number]) Olympus endoscope was introduced                            through the mouth, and advanced to the second part                            of duodenum. The upper GI endoscopy was                            accomplished without difficulty. The patient                            tolerated the procedure well. Scope In: Scope Out: Findings:      The esophagus revealed esophagitis as manifested by edema and friability       in the region of the gastroesophageal junction. There was also a large       caliber ring/stricture. No Barrett's esophagus.      The stomach revealed mild inflammatory changes in the prepyloric antrum.       There was a small hiatal hernia. The stomach was otherwise normal.      The examined duodenum was normal.      The cardia and gastric fundus were normal on retroflexion. Impression:               1. GERD with esophagitis and stricture                           2. Small hiatal hernia                           3. Mild gastritis. Moderate Sedation:      none Recommendation:           1. Patient has a contact number available for                            emergencies. The signs and symptoms of potential  delayed complications were discussed with the                            patient. Return to normal activities tomorrow.                            Written discharge instructions were provided to the                            patient.                           2. Resume previous diet.                           3. PRESCRIBED PANTOPRAZOLE 40 mg twice daily; #60;                            11 refills                           4. Please schedule office follow-up with Dr. Henrene Pastor                            in about 6 weeks                           . Procedure Code(s):        --- Professional ---                           [phone], Esophagogastroduodenoscopy, flexible,                             transoral; diagnostic, including collection of                            specimen(s) by brushing or washing, when performed                            (separate procedure) Diagnosis Code(s):        --- Professional ---                           R13.10, Dysphagia, unspecified CPT copyright 2019 [employer]. All rights reserved. The codes documented in this report are preliminary and upon coder review may  be revised to meet current compliance requirements. Docia Chuck. Henrene Pastor, MD 09/13/2021 10:21:34 AM This report has been signed electronically. Number of Addenda: 0

## 2021-09-13 NOTE — Anesthesia Postprocedure Evaluation (Signed)
Anesthesia Post Note  Patient: Brianna Gates  Procedure(s) Performed: ESOPHAGOGASTRODUODENOSCOPY (EGD) WITH PROPOFOL     Patient location during evaluation: PACU Anesthesia Type: MAC Level of consciousness: awake Pain management: pain level controlled Vital Signs Assessment: post-procedure vital signs reviewed and stable Respiratory status: spontaneous breathing Postop Assessment: no apparent nausea or vomiting Anesthetic complications: no   No notable events documented.  Last Vitals:  Vitals:   09/13/21 1020 09/13/21 1025  BP: (!) 143/69   Pulse: (!) 53 (!) 52  Resp: (!) 9 12  Temp:    SpO2: 100% 100%    Last Pain:  Vitals:   09/13/21 1010  TempSrc: Oral  PainSc: 0-No pain                 Belky Mundo

## 2021-09-13 NOTE — Interval H&P Note (Signed)
History and Physical Interval Note:  09/13/2021 8:59 AM  Brianna Gates  has presented today for surgery, with the diagnosis of gerd; dysphagia.  The various methods of treatment have been discussed with the patient and family. After consideration of risks, benefits and other options for treatment, the patient has consented to  Procedure(s): ESOPHAGOGASTRODUODENOSCOPY (EGD) WITH PROPOFOL (N/A) as a surgical intervention.  The patient's history has been reviewed, patient examined, no change in status, stable for surgery.  I have reviewed the patient's chart and labs.  Questions were answered to the patient's satisfaction.     [name]

## 2021-09-15 ENCOUNTER — Encounter (HOSPITAL_COMMUNITY): Payer: Self-pay | Admitting: Internal Medicine

## 2021-09-19 DIAGNOSIS — J3089 Other allergic rhinitis: Secondary | ICD-10-CM | POA: Diagnosis not present

## 2021-10-04 DIAGNOSIS — J3089 Other allergic rhinitis: Secondary | ICD-10-CM | POA: Diagnosis not present

## 2021-10-05 NOTE — Progress Notes (Signed)
PATIENT: Brianna Gates DOB: 1951-09-05  REASON FOR VISIT: Follow up for GBS HISTORY FROM: Patient PRIMARY NEUROLOGIST: Dr. Krista Blue since Dr. Jannifer Franklin has retired   HISTORY OF PRESENT ILLNESS: Today 10/06/21 Brianna Gates here today for follow-up with history of Guillain Barr syndrome.  Is on Cymbalta for uncomfortable sensory alterations in the feet. Gabapentin made her feel drowsy. Taking Cymbalta 60 mg daily. Mostly feels like sand in between toes, ball of foot. Can't walk barefoot. No falls. The sensation isn't painful. Her right hand can tingle with overuse. Wants to stay on Cymbalta for now. Is going to see a therapist to see about ADD. If they put her on a mood medication, she may go back to gabapentin. She does water aerobics. Here alone.   HISTORY  04/07/2021 Dr. Jannifer Franklin: Brianna Gates is a 70 year old right-handed white female with a history of Guillain-Barre syndrome in the past.  The patient has had some residual numbness and tingling in the feet, she has some mild balance issues but she has not had any falls.  She recently had a redo of a knee replacement on the right, she has done well with recovery.  She recently had a COVID infection in January 2022.  The patient has recovered from this.  She has been on gabapentin for some of the discomfort in the feet, but she has had a lot of daytime drowsiness and some gait instability in the evening when she took the medication.  She has stopped the medication at this point.  She is on Cymbalta taking 20 mg daily but she does not believe this helps her symptoms.  REVIEW OF SYSTEMS: Out of a complete 14 system review of symptoms, the patient complains only of the following symptoms, and all other reviewed systems are negative.  See HPI  ALLERGIES: Allergies  Allergen Reactions   Buspar [Buspirone] Itching    Vivid dreams, diarrhea    Captopril Other (See Comments)    reaction   Oxaprozin Hives and Other (See Comments)   Ramipril Cough  and Other (See Comments)   Zoloft [Sertraline Hcl] Diarrhea   Ace Inhibitors     Other reaction(s): Other (See Comments) GB syndrome   Doxycycline Other (See Comments)    Burning sensation/Rash   Influenza Vaccines     HX    Sulfa Antibiotics Other (See Comments)    unknown    HOME MEDICATIONS: Outpatient Medications Prior to Visit  Medication Sig Dispense Refill   acetaminophen (TYLENOL) 650 MG CR tablet Take 1,300 mg by mouth every 8 (eight) hours as needed for pain or fever.     albuterol (PROVENTIL HFA;VENTOLIN HFA) 108 (90 BASE) MCG/ACT inhaler Inhale 2 puffs into the lungs every 6 (six) hours as needed for wheezing or shortness of breath.     Ascorbic Acid (VITAMIN C PO) Take 1 tablet by mouth daily as needed (sick symptoms).     azelastine (ASTELIN) 0.1 % nasal spray Place 1 spray into both nostrils 2 (two) times daily as needed (cold symptoms).     Cholecalciferol (VITAMIN D) 2000 UNITS CAPS Take 2,000 Units by mouth daily in the afternoon.     COVID-19 mRNA bivalent vaccine, Pfizer, (PFIZER COVID-19 VAC BIVALENT) injection Inject into the muscle. 0.3 mL 0   DULoxetine (CYMBALTA) 60 MG capsule Take 1 capsule (60 mg total) by mouth daily. 30 capsule 5   Emollient (CETAPHIL) cream Apply 1 application topically daily.     EPINEPHrine 0.3 mg/0.3 mL IJ SOAJ  injection Inject 0.3 mg into the muscle once as needed for anaphylaxis.     estradiol (ESTRACE) 0.5 MG tablet Take 0.5 mg by mouth daily.     fexofenadine (ALLEGRA) 180 MG tablet Take 180 mg by mouth daily.     fluticasone (FLONASE) 50 MCG/ACT nasal spray Place 1 spray into both nostrils daily.     guaiFENesin (MUCINEX) 600 MG 12 hr tablet Take 1,200 mg by mouth daily.     ibuprofen (ADVIL,MOTRIN) 100 MG tablet Take 400-800 mg by mouth daily at 12 noon.     levothyroxine (SYNTHROID) 50 MCG tablet Take 50 mcg by mouth daily before breakfast.      losartan (COZAAR) 25 MG tablet Take 1 tablet (25 mg total) by mouth daily. (Patient  taking differently: Take 50 mg by mouth daily.) 60 tablet 0   pantoprazole (PROTONIX) 40 MG tablet Take 1 tablet (40 mg total) by mouth 2 (two) times daily. 60 tablet 11   Propylene Glycol-Glycerin (SOOTHE) 0.6-0.6 % SOLN Place 1 drop into both eyes 2 (two) times daily.     rosuvastatin (CRESTOR) 5 MG tablet Take 5 mg by mouth every other day.     Simethicone (PHAZYME PO) Take 250 mg by mouth daily as needed (Gas).     triamcinolone cream (KENALOG) 0.1 % 1 application as needed (itching bumps on stomach). Mixed with Cetaphil     amoxicillin (AMOXIL) 500 MG capsule Take 2,000 mg by mouth once as needed (prior to dental appointments). (Patient not taking: Reported on 10/06/2021)     amoxicillin-clavulanate (AUGMENTIN) 875-125 MG tablet Take 1 tablet by mouth 2 (two) times daily.     predniSONE (DELTASONE) 20 MG tablet Take 20 mg by mouth daily.     Probiotic Product (PROBIOTIC DAILY PO) Take 1 tablet by mouth daily.     No facility-administered medications prior to visit.    PAST MEDICAL HISTORY: Past Medical History:  Diagnosis Date   Allergy    Anxiety    Asthma    INFREQUENT PROBLEM - rarely used inhaler   Cervical spondylosis    Cervical strain 16/12/8464   Complication of anesthesia    UNKNOWN REACTION WITH RASH AT SITE OF IV only with knee surgery   Degenerative arthritis    Dyslipidemia    Eczema    GERD (gastroesophageal reflux disease)    Guillain-Barre syndrome ([place])    History of Guillain-Barre syndrome 07/22/2014   Hyperlipidemia    Hypertension    Hypothyroidism    Lumbosacral spondylosis    Raynaud disease    Thyroid nodule     PAST SURGICAL HISTORY: Past Surgical History:  Procedure Laterality Date   ABDOMINAL HYSTERECTOMY     COLONOSCOPY     ESOPHAGOGASTRODUODENOSCOPY (EGD) WITH PROPOFOL N/A 09/13/2021   Procedure: ESOPHAGOGASTRODUODENOSCOPY (EGD) WITH PROPOFOL;  Surgeon: Irene Shipper, MD;  Location: [facility];  Service: Endoscopy;  Laterality: N/A;    FOOT SURGERY Left    GANGLION CYST EXCISION Left    Wrist   HERNIA REPAIR     KNEE ARTHROSCOPY     RT KNEE   KNEE ARTHROTOMY Right 08/27/2013   Procedure: RIGHT KNEE SCAR EXCISION AND FEMORAL REVISION;  Surgeon: Gearlean Alf, MD;  Location: [facility];  Service: Orthopedics;  Laterality: Right;   REPLACEMENT TOTAL KNEE Right    2014, 2021   REPLACEMENT TOTAL KNEE BILATERAL Bilateral    STRABISMUS SURGERY Right    TONSILLECTOMY     vaginal vulvo prolapse  09/16/2015  FAMILY HISTORY: Family History  Problem Relation Age of Onset   Hypertension Mother    Other Mother        Dyslipidemia   Heart disease Father    Other Brother        Dyslipidemia   Stomach cancer Paternal Uncle    Colon cancer Neg Hx    Rectal cancer Neg Hx    Esophageal cancer Neg Hx     SOCIAL HISTORY: Social History   Socioeconomic History   Marital status: Married    Spouse name: Not on file   Number of children: 2   Years of education: college   Highest education level: Not on file  Occupational History    Employer: UNEMPLOYED  Tobacco Use   Smoking status: Never   Smokeless tobacco: Never  Vaping Use   Vaping Use: Never used  Substance and Sexual Activity   Alcohol use: Yes    Alcohol/week: 6.0 standard drinks    Types: 6 Glasses of wine per week    Comment: wine weekly   Drug use: No   Sexual activity: Yes    Birth control/protection: Other-see comments, Post-menopausal    Comment: Hysterectomy  Other Topics Concern   Not on file  Social History Narrative   Lives at home, married   Patient is right handed.   Patient drinks 2-3 cups caffeine daily.   Social Determinants of Health   Financial Resource Strain: Not on file  Food Insecurity: Not on file  Transportation Needs: Not on file  Physical Activity: Not on file  Stress: Not on file  Social Connections: Not on file  Intimate Partner Violence: Not on file   PHYSICAL EXAM  Vitals:   10/06/21 0917  BP: 127/73  Pulse: 72   Weight: 131 lb 8 oz (59.6 kg)  Height: 5\' 4"  (1.626 m)   Body mass index is 22.57 kg/m.  Generalized: Well developed, in no acute distress  Neurological examination  Mentation: Alert oriented to time, place, history taking. Follows all commands speech and language fluent Cranial nerve II-XII: Pupils were equal round reactive to light. Extraocular movements were full, visual field were full on confrontational test. Facial sensation and strength were normal. Head turning and shoulder shrug  were normal and symmetric. Motor: The motor testing reveals 5 over 5 strength of all 4 extremities. Good symmetric motor tone is noted throughout.  Sensory: tingling to pinprick of the left foot from toe to mid foot, vibration sensation intact at ankles bilaterally Coordination: Cerebellar testing reveals good finger-nose-finger and heel-to-shin bilaterally.  Gait and station: Gait is normal, steady, with walking barefoot, her toes tend to grip the ground Reflexes: Deep tendon reflexes are symmetric and normal bilaterally.   DIAGNOSTIC DATA (LABS, IMAGING, TESTING) - I reviewed patient records, labs, notes, testing and imaging myself where available.  Lab Results  Component Value Date   WBC 6.0 12/28/2020   HGB 14.2 12/28/2020   HCT 43.7 12/28/2020   MCV 98.6 12/28/2020   PLT 162 12/28/2020      Component Value Date/Time   NA 138 12/28/2020 0456   K 4.0 12/28/2020 0456   CL 106 12/28/2020 0456   CO2 25 12/28/2020 0456   GLUCOSE 105 (H) 12/28/2020 0456   BUN 12 12/28/2020 0456   CREATININE 0.65 12/28/2020 0456   CALCIUM 9.1 12/28/2020 0456   PROT 7.0 07/26/2011 1600   ALBUMIN 4.1 07/26/2011 1600   AST 27 07/26/2011 1600   ALT 22 07/26/2011 1600  ALKPHOS 83 07/26/2011 1600   BILITOT 0.2 (L) 07/26/2011 1600   GFRNONAA >60 12/28/2020 0456   GFRAA >90 08/29/2013 0405   No results found for: CHOL, HDL, LDLCALC, LDLDIRECT, TRIG, CHOLHDL No results found for: HGBA1C No results found for:  VITAMINB12 Lab Results  Component Value Date   TSH 1.068 12/27/2020   ASSESSMENT AND PLAN 70 y.o. year old female  has a past medical history of Allergy, Anxiety, Asthma, Cervical spondylosis, Cervical strain (64/12/3293), Complication of anesthesia, Degenerative arthritis, Dyslipidemia, Eczema, GERD (gastroesophageal reflux disease), Guillain-Barre syndrome ([place]), History of Guillain-Barre syndrome (07/22/2014), Hyperlipidemia, Hypertension, Hypothyroidism, Lumbosacral spondylosis, Raynaud disease, and Thyroid nodule. here with:  History of Guillain-Barre Syndrome  -Symptoms remain overall stable, has some residual sensory alterations -Will continue Cymbalta 60 mg daily -She may request to switch back to gabapentin if she starts another mood medications -Encouraged to continue exercise -We will follow-up in 1 year or sooner if needed, she will be followed by Dr.Yan in the future  Butler Denmark, AGNP-C, DNP 10/06/2021, 9:22 AM [facility] [address] [place], [place] [address] [phone]

## 2021-10-06 ENCOUNTER — Encounter: Payer: Self-pay | Admitting: Neurology

## 2021-10-06 ENCOUNTER — Ambulatory Visit: Payer: PPO | Admitting: Neurology

## 2021-10-06 VITALS — BP 127/73 | HR 72 | Ht 64.0 in | Wt 131.5 lb

## 2021-10-06 DIAGNOSIS — G610 Guillain-Barre syndrome: Secondary | ICD-10-CM

## 2021-10-06 MED ORDER — DULOXETINE HCL 60 MG PO CPEP: 60.0000 mg | ORAL_CAPSULE | Freq: Every day | ORAL | 3 refills | Status: DC

## 2021-10-06 NOTE — Patient Instructions (Signed)
Great to see you today! Will continue the Cymbalta at current dose See you back in 1 year

## 2021-10-06 NOTE — Progress Notes (Signed)
Chart reviewed, agree above plan ?

## 2021-10-11 ENCOUNTER — Other Ambulatory Visit: Payer: Self-pay

## 2021-10-11 ENCOUNTER — Ambulatory Visit: Payer: PPO | Admitting: Podiatry

## 2021-10-11 DIAGNOSIS — B351 Tinea unguium: Secondary | ICD-10-CM | POA: Diagnosis not present

## 2021-10-11 DIAGNOSIS — M79675 Pain in left toe(s): Secondary | ICD-10-CM

## 2021-10-11 DIAGNOSIS — B353 Tinea pedis: Secondary | ICD-10-CM | POA: Diagnosis not present

## 2021-10-11 DIAGNOSIS — L600 Ingrowing nail: Secondary | ICD-10-CM

## 2021-10-11 DIAGNOSIS — M79674 Pain in right toe(s): Secondary | ICD-10-CM | POA: Diagnosis not present

## 2021-10-11 MED ORDER — KETOCONAZOLE 2 % EX CREA: 1.0000 "application " | TOPICAL_CREAM | Freq: Every day | CUTANEOUS | 0 refills | Status: AC

## 2021-10-14 NOTE — Progress Notes (Signed)
Subjective: 70 year old female presents the office today for follow-up evaluation of ingrown toenail to her big toes nails becoming thickened discolored.  She denies any swelling redness or any drainage to the toenail sites.  She does get dry, peeling skin between her toes on left foot worse than right.  No skin breakdown.  Pustules or signs of infection otherwise.  Objective: AAO x3, NAD DP/PT pulses palpable bilaterally, CRT less than 3 seconds Mild incurvation to the hallux toenails bilaterally and the hallux nails are mildly hypertrophic, dystrophic.  No edema, erythema and signs of infection noted today.   There is dry, peeling, erythematous skin interdigitally left side worse than right.  There is no skin breakdown, drainage of pus or any signs of infection. No open lesions or pre-ulcerative lesions.  No pain with calf compression, swelling, warmth, erythema  Assessment: Chronic ingrown toenails, symptomatic onychomycosis without signs of infection; tinea pedis  Plan: -All treatment options discussed with the patient including all alternatives, risks, complications.  -Patient debrided nails x2 without any complications or bleeding.  She would like to hold off on partial nail avulsions. -Ketoconazole interdigitally. -Daily foot inspection.  Monitoring signs or symptoms of infection.  Return in about 9 weeks (around 12/13/2021) for ingrown toenail.  Trula Slade DPM

## 2021-10-18 DIAGNOSIS — J3089 Other allergic rhinitis: Secondary | ICD-10-CM | POA: Diagnosis not present

## 2021-10-26 ENCOUNTER — Encounter: Payer: Self-pay | Admitting: Internal Medicine

## 2021-10-26 ENCOUNTER — Ambulatory Visit: Payer: PPO | Admitting: Internal Medicine

## 2021-10-26 VITALS — BP 154/62 | HR 61 | Ht 64.5 in | Wt 130.6 lb

## 2021-10-26 DIAGNOSIS — R1319 Other dysphagia: Secondary | ICD-10-CM

## 2021-10-26 DIAGNOSIS — R140 Abdominal distension (gaseous): Secondary | ICD-10-CM | POA: Diagnosis not present

## 2021-10-26 DIAGNOSIS — K219 Gastro-esophageal reflux disease without esophagitis: Secondary | ICD-10-CM

## 2021-10-26 DIAGNOSIS — K5900 Constipation, unspecified: Secondary | ICD-10-CM

## 2021-10-26 NOTE — Patient Instructions (Signed)
If you are age 72 or older, your body mass index should be between 23-30. Your Body mass index is 22.07 kg/m. If this is out of the aforementioned range listed, please consider follow up with your Primary Care Provider.  If you are age 57 or younger, your body mass index should be between 19-25. Your Body mass index is 22.07 kg/m. If this is out of the aformentioned range listed, please consider follow up with your Primary Care Provider.   ________________________________________________________  The [facility] providers would like to encourage you to use [facility] to communicate with providers for non-urgent requests or questions.  Due to long hold times on the telephone, sending your provider a message by [facility] may be a faster and more efficient way to get a response.  Please allow 48 business hours for a response.  Please remember that this is for non-urgent requests.  _______________________________________________________  Dennis Bast may decrease your Pantoprazole 40mg  to ONCE a day.  You have been given samples of Linzess 135mcg.  If this works for you, call the office and we will send in a prescription.  Please follow up in 6 months

## 2021-10-26 NOTE — Progress Notes (Signed)
HISTORY OF PRESENT ILLNESS:  Brianna Gates is a 71 y.o. female who was evaluated in the office August 30, 2021 regarding GERD and pharyngeal complaints.  She described intermittent problems with pharyngeal burning as well as a globus sensation.  She also complained of bloating and constipation.  She was told to take Colace regularly.  Colonoscopy in 2016 was normal.  Upper endoscopy was performed September 13, 2021.  She was found to have esophagitis and stricture.  No dilation.  She was prescribed pantoprazole 40 mg twice daily.  She follows up at this time.  She tells me that her pharyngeal burning has resolved.  She still has a globus type sensation but no esophageal dysphagia.  He also complains of constipation and bloating.  She tried Colace irregularly with uncertain results.  She also describes some cramping discomfort at times.  No new complaints  REVIEW OF SYSTEMS:  All non-GI ROS negative unless otherwise stated in the HPI except for sinus and allergy, arthritis, fatigue, itching  Past Medical History:  Diagnosis Date   Allergy    Anxiety    Asthma    INFREQUENT PROBLEM - rarely used inhaler   Cervical spondylosis    Cervical strain 78/11/4233   Complication of anesthesia    UNKNOWN REACTION WITH RASH AT SITE OF IV only with knee surgery   Degenerative arthritis    Dyslipidemia    Eczema    GERD (gastroesophageal reflux disease)    Guillain-Barre syndrome ([place])    History of Guillain-Barre syndrome 07/22/2014   Hyperlipidemia    Hypertension    Hypothyroidism    Lumbosacral spondylosis    Raynaud disease    Thyroid nodule     Past Surgical History:  Procedure Laterality Date   ABDOMINAL HYSTERECTOMY     COLONOSCOPY     ESOPHAGOGASTRODUODENOSCOPY (EGD) WITH PROPOFOL N/A 09/13/2021   Procedure: ESOPHAGOGASTRODUODENOSCOPY (EGD) WITH PROPOFOL;  Surgeon: Irene Shipper, MD;  Location: [facility];  Service: Endoscopy;  Laterality: N/A;   FOOT SURGERY Left    GANGLION  CYST EXCISION Left    Wrist   HERNIA REPAIR     KNEE ARTHROSCOPY     RT KNEE   KNEE ARTHROTOMY Right 08/27/2013   Procedure: RIGHT KNEE SCAR EXCISION AND FEMORAL REVISION;  Surgeon: Gearlean Alf, MD;  Location: [facility];  Service: Orthopedics;  Laterality: Right;   REPLACEMENT TOTAL KNEE Right    2014, 2021   REPLACEMENT TOTAL KNEE BILATERAL Bilateral    STRABISMUS SURGERY Right    TONSILLECTOMY     vaginal vulvo prolapse  09/16/2015    Social History Brianna Gates  reports that she has never smoked. She has never used smokeless tobacco. She reports current alcohol use of about 6.0 standard drinks per week. She reports that she does not use drugs.  family history includes Heart disease in her father; Hypertension in her mother; Other in her brother and mother; Stomach cancer in her paternal uncle.  Allergies  Allergen Reactions   Buspar [Buspirone] Itching    Vivid dreams, diarrhea    Captopril Other (See Comments)    reaction   Oxaprozin Hives and Other (See Comments)   Ramipril Cough and Other (See Comments)   Zoloft [Sertraline Hcl] Diarrhea   Ace Inhibitors     Other reaction(s): Other (See Comments) GB syndrome   Doxycycline Other (See Comments)    Burning sensation/Rash   Influenza Vaccines     HX    Sulfa Antibiotics Other (  See Comments)    unknown       PHYSICAL EXAMINATION: Vital signs: BP (!) 154/62    Pulse 61    Ht 5' 4.5" (1.638 m)    Wt 130 lb 9.6 oz (59.2 kg)    SpO2 98%    BMI 22.07 kg/m   Constitutional: generally well-appearing, no acute distress Psychiatric: alert and oriented x3, cooperative Eyes: extraocular movements intact, anicteric, conjunctiva pink Mouth: oral pharynx moist, no lesions Neck: supple no lymphadenopathy Cardiovascular: heart regular rate and rhythm, no murmur Lungs: clear to auscultation bilaterally Abdomen: soft, nontender, nondistended, no obvious ascites, no peritoneal signs, normal bowel sounds, no  organomegaly Rectal: Omitted Extremities: no clubbing, cyanosis, or lower extremity edema bilaterally Skin: no lesions on visible extremities Neuro: No focal deficits.  Cranial nerves intact  ASSESSMENT:  1.  GERD.  Esophagitis and incidental stricture on endoscopy.  Pharyngeal burning better on twice daily PPI. 2.  Globus type sensation.  Persists.  She states she is going to see her PCP regarding this as she is concerned about sinus drainage being the etiology. 3.  Constipation with bloating 4.  Normal colonoscopy 2016   PLAN:  1.  Decrease pantoprazole to 40 mg once daily 2.  Reflux precautions 3.  Linzess 145 mcg daily.  Medication effects and risks reviewed.  Samples provided.  She will contact the office for prescription if this is helpful 4.  Routine office follow-up 6 months.  Contact the office in the interim for any questions or problems A total time of 30 minutes was spent preparing to see the patient, reviewing tests and endoscopy reports, obtaining interval history, performing medically appropriate physical examination, counseling the patient regarding her above listed issues, ordering medications and arranging follow-up, and documenting clinical information in the health record

## 2021-10-27 DIAGNOSIS — H6593 Unspecified nonsuppurative otitis media, bilateral: Secondary | ICD-10-CM | POA: Diagnosis not present

## 2021-10-27 DIAGNOSIS — R002 Palpitations: Secondary | ICD-10-CM | POA: Diagnosis not present

## 2021-10-27 DIAGNOSIS — J309 Allergic rhinitis, unspecified: Secondary | ICD-10-CM | POA: Diagnosis not present

## 2021-10-27 DIAGNOSIS — J0100 Acute maxillary sinusitis, unspecified: Secondary | ICD-10-CM | POA: Diagnosis not present

## 2021-10-27 DIAGNOSIS — I10 Essential (primary) hypertension: Secondary | ICD-10-CM | POA: Diagnosis not present

## 2021-10-28 DIAGNOSIS — Z79891 Long term (current) use of opiate analgesic: Secondary | ICD-10-CM | POA: Diagnosis not present

## 2021-10-28 DIAGNOSIS — F1010 Alcohol abuse, uncomplicated: Secondary | ICD-10-CM | POA: Diagnosis not present

## 2021-10-30 ENCOUNTER — Emergency Department (HOSPITAL_BASED_OUTPATIENT_CLINIC_OR_DEPARTMENT_OTHER)
Admission: EM | Admit: 2021-10-30 | Discharge: 2021-10-30 | Disposition: A | Discharge status: Home / Self Care | Payer: PPO | Attending: Emergency Medicine | Admitting: Emergency Medicine

## 2021-10-30 ENCOUNTER — Emergency Department (HOSPITAL_BASED_OUTPATIENT_CLINIC_OR_DEPARTMENT_OTHER): Payer: PPO | Admitting: Radiology

## 2021-10-30 ENCOUNTER — Other Ambulatory Visit: Payer: Self-pay

## 2021-10-30 ENCOUNTER — Encounter (HOSPITAL_BASED_OUTPATIENT_CLINIC_OR_DEPARTMENT_OTHER): Payer: Self-pay | Admitting: Emergency Medicine

## 2021-10-30 DIAGNOSIS — R0789 Other chest pain: Secondary | ICD-10-CM | POA: Diagnosis present

## 2021-10-30 DIAGNOSIS — R079 Chest pain, unspecified: Secondary | ICD-10-CM | POA: Diagnosis not present

## 2021-10-30 DIAGNOSIS — Z79899 Other long term (current) drug therapy: Secondary | ICD-10-CM | POA: Insufficient documentation

## 2021-10-30 DIAGNOSIS — I10 Essential (primary) hypertension: Secondary | ICD-10-CM | POA: Diagnosis not present

## 2021-10-30 LAB — CBC WITH DIFFERENTIAL/PLATELET
Abs Immature Granulocytes: 0.01 10*3/uL (ref 0.00–0.07)
Basophils Absolute: 0.1 10*3/uL (ref 0.0–0.1)
Basophils Relative: 2 %
Eosinophils Absolute: 0.2 10*3/uL (ref 0.0–0.5)
Eosinophils Relative: 4 %
HCT: 44.4 % (ref 36.0–46.0)
Hemoglobin: 14.4 g/dL (ref 12.0–15.0)
Immature Granulocytes: 0 %
Lymphocytes Relative: 27 %
Lymphs Abs: 1.2 10*3/uL (ref 0.7–4.0)
MCH: 32.8 pg (ref 26.0–34.0)
MCHC: 32.4 g/dL (ref 30.0–36.0)
MCV: 101.1 fL — ABNORMAL HIGH (ref 80.0–100.0)
Monocytes Absolute: 0.5 10*3/uL (ref 0.1–1.0)
Monocytes Relative: 11 %
Neutro Abs: 2.5 10*3/uL (ref 1.7–7.7)
Neutrophils Relative %: 56 %
Platelets: 166 10*3/uL (ref 150–400)
RBC: 4.39 MIL/uL (ref 3.87–5.11)
RDW: 12.5 % (ref 11.5–15.5)
WBC: 4.5 10*3/uL (ref 4.0–10.5)
nRBC: 0 % (ref 0.0–0.2)

## 2021-10-30 LAB — BASIC METABOLIC PANEL
Anion gap: 7 (ref 5–15)
BUN: 18 mg/dL (ref 8–23)
CO2: 30 mmol/L (ref 22–32)
Calcium: 9.7 mg/dL (ref 8.9–10.3)
Chloride: 103 mmol/L (ref 98–111)
Creatinine, Ser: 0.71 mg/dL (ref 0.44–1.00)
GFR, Estimated: >60 mL/min (ref >60)
Glucose, Bld: 100 mg/dL — ABNORMAL HIGH (ref 70–99)
Potassium: 4.1 mmol/L (ref 3.5–5.1)
Sodium: 140 mmol/L (ref 135–145)

## 2021-10-30 LAB — TROPONIN I (HIGH SENSITIVITY): Troponin I (High Sensitivity): 3 ng/L (ref <18)

## 2021-10-30 MED ORDER — LOSARTAN POTASSIUM 25 MG PO TABS
50.0000 mg | ORAL_TABLET | Freq: Once | ORAL | Status: AC
  Administered 2021-10-30: 50 mg via ORAL
  Filled 2021-10-30: qty 2

## 2021-10-30 NOTE — Discharge Instructions (Signed)
Please follow-up with your primary care doctor this week to discuss your blood pressure management.  If you develop chest pain, difficulty breathing or other new concerning symptom, come back to ER for reassessment.

## 2021-10-30 NOTE — ED Triage Notes (Signed)
Pt saw primary on THursday because her blood pressure had been high. Pt was told to double her losartan. Pt states her bp is 200's this am and was concerned. Denies headache.

## 2021-10-30 NOTE — ED Provider Notes (Signed)
[facility] EMERGENCY DEPT Provider Note   CSN: [id number] Arrival date & time: 10/30/21  0737     History  Chief Complaint  Patient presents with   Hypertension    Brianna Gates is a 71 y.o. female.  Presenting to the emergency room with concern for hypertension.  Patient reports that he went to her primary care office on Thursday due to her blood pressure running high lately and her primary care doctor recommended increasing the losartan to 50 mg twice a day instead of 50 mg daily.  States that she has been compliant with this however her blood pressures have been persistently elevated up to 628 systolic.  She denies any medical complaints today, specifically no chest pain, shortness of breath, headache or lightheadedness.  Yesterday she did have an episode of a few seconds of fleeting chest discomfort/palpitations.  No further similar symptoms today.  Symptoms occurred at rest and were not associated with exertion.  Patient accompanied by husband.  HPI     Home Medications Prior to Admission medications   Medication Sig Start Date End Date Taking? Authorizing Provider  acetaminophen (TYLENOL) 650 MG CR tablet Take 1,300 mg by mouth every 8 (eight) hours as needed for pain or fever.    [provider]  albuterol (PROVENTIL HFA;VENTOLIN HFA) 108 (90 BASE) MCG/ACT inhaler Inhale 2 puffs into the lungs every 6 (six) hours as needed for wheezing or shortness of breath.    [provider]  amoxicillin (AMOXIL) 500 MG capsule Take 2,000 mg by mouth once as needed (prior to dental appointments).    [provider]  Ascorbic Acid (VITAMIN C PO) Take 1 tablet by mouth daily as needed (sick symptoms).    [provider]  azelastine (ASTELIN) 0.1 % nasal spray Place 1 spray into both nostrils 2 (two) times daily as needed (cold symptoms).    [provider]  Cholecalciferol (VITAMIN D) 2000 UNITS CAPS Take 2,000 Units by mouth  daily in the afternoon.    [provider]  DULoxetine (CYMBALTA) 60 MG capsule Take 1 capsule (60 mg total) by mouth daily. 10/06/21   Suzzanne Cloud, NP  Emollient (CETAPHIL) cream Apply 1 application topically daily.    [provider]  EPINEPHrine 0.3 mg/0.3 mL IJ SOAJ injection Inject 0.3 mg into the muscle once as needed for anaphylaxis. 11/21/19   [provider]  estradiol (ESTRACE) 0.5 MG tablet Take 0.5 mg by mouth daily.    [provider]  fexofenadine (ALLEGRA) 180 MG tablet Take 180 mg by mouth as needed.    [provider]  fluticasone (FLONASE) 50 MCG/ACT nasal spray Place 1 spray into both nostrils daily as needed.    [provider]  guaiFENesin (MUCINEX) 600 MG 12 hr tablet Take 1,200 mg by mouth daily as needed.    [provider]  ibuprofen (ADVIL,MOTRIN) 100 MG tablet Take 400 mg by mouth daily at 12 noon. May take in the pm additionally as needed    [provider]  ketoconazole (NIZORAL) 2 % cream Apply 1 application topically daily. Patient taking differently: Apply 1 application topically daily as needed. 10/11/21   Trula Slade, DPM  levothyroxine (SYNTHROID) 50 MCG tablet Take 50 mcg by mouth daily before breakfast.     [provider]  losartan (COZAAR) 25 MG tablet Take 1 tablet (25 mg total) by mouth daily. Patient taking differently: Take 50 mg by mouth daily. 12/28/20 10/06/21  Irene Pap  N, DO  pantoprazole (PROTONIX) 40 MG tablet Take 1 tablet (40 mg total) by mouth 2 (two) times daily. 09/13/21   Irene Shipper, MD  Propylene Glycol-Glycerin (SOOTHE) 0.6-0.6 % SOLN Place 1 drop into both eyes daily.    [provider]  rosuvastatin (CRESTOR) 5 MG tablet Take 5 mg by mouth every other day.    [provider]  Simethicone (PHAZYME PO) Take 250 mg by mouth daily as needed (Gas).    [provider]  triamcinolone cream (KENALOG) 0.1 % 1 application as  needed (itching bumps on stomach). Mixed with Cetaphil 02/01/21   [provider]      Allergies    Buspar [buspirone], Captopril, Oxaprozin, Ramipril, Zoloft [sertraline hcl], Ace inhibitors, Doxycycline, Influenza vaccines, and Sulfa antibiotics    Review of Systems   Review of Systems  Constitutional:  Negative for chills and fever.  HENT:  Negative for ear pain and sore throat.   Eyes:  Negative for pain and visual disturbance.  Respiratory:  Negative for cough and shortness of breath.   Cardiovascular:  Positive for chest pain and palpitations.  Gastrointestinal:  Negative for abdominal pain and vomiting.  Genitourinary:  Negative for dysuria and hematuria.  Musculoskeletal:  Negative for arthralgias and back pain.  Skin:  Negative for color change and rash.  Neurological:  Negative for seizures and syncope.  All other systems reviewed and are negative.  Physical Exam Updated Vital Signs BP (!) 174/73 (BP Location: Right Arm)    Pulse 61    Temp 98 F (36.7 C) (Oral)    Resp 12    SpO2 100%  Physical Exam Vitals and nursing note reviewed.  Constitutional:      General: She is not in acute distress.    Appearance: She is well-developed.  HENT:     Head: Normocephalic and atraumatic.  Eyes:     Conjunctiva/sclera: Conjunctivae normal.  Cardiovascular:     Rate and Rhythm: Normal rate and regular rhythm.     Heart sounds: No murmur heard. Pulmonary:     Effort: Pulmonary effort is normal. No respiratory distress.     Breath sounds: Normal breath sounds.  Abdominal:     Palpations: Abdomen is soft.     Tenderness: There is no abdominal tenderness.  Musculoskeletal:        General: No swelling.     Cervical back: Neck supple.  Skin:    General: Skin is warm and dry.     Capillary Refill: Capillary refill takes less than 2 seconds.  Neurological:     General: No focal deficit present.     Mental Status: She is alert.  Psychiatric:        Mood and Affect:  Mood normal.    ED Results / Procedures / Treatments   Labs (all labs ordered are listed, but only abnormal results are displayed) Labs Reviewed  CBC WITH DIFFERENTIAL/PLATELET - Abnormal; Notable for the following components:      Result Value   MCV 101.1 (*)    All other components within normal limits  BASIC METABOLIC PANEL - Abnormal; Notable for the following components:   Glucose, Bld 100 (*)    All other components within normal limits  TROPONIN I (HIGH SENSITIVITY)    EKG EKG Interpretation  Date/Time:  Sunday October 30 2021 07:58:46 EST Ventricular Rate:  67 PR Interval:  168 QRS Duration: 86 QT Interval:  392 QTC Calculation: 414 R Axis:   72  Text Interpretation: Sinus rhythm No significant changes when compared to prior ecg on 12/28/20 Confirmed by Madalyn Rob [phone]) on 10/30/2021 8:44:43 AM  Radiology DG Chest 2 View  Result Date: 10/30/2021 CLINICAL DATA:  Chest pain EXAM: CHEST - 2 VIEW COMPARISON:  12/27/2020 FINDINGS: Artifact from EKG leads. Normal heart size and mediastinal contours. No acute infiltrate or edema. No effusion or pneumothorax. No acute osseous findings. IMPRESSION: No active cardiopulmonary disease. Electronically Signed   By: Jorje Guild M.D.   On: 10/30/2021 08:38    Procedures Procedures    Medications Ordered in ED Medications  losartan (COZAAR) tablet 50 mg (50 mg Oral Given 10/30/21 8416)    ED Course/ Medical Decision Making/ A&P                           Medical Decision Making  71 year old lady presents to ER with concern for elevated blood pressure.  Currently patient is asymptomatic though she did endorse having fleeting episode of chest discomfort/palpitations yesterday.  On exam today patient well-appearing in no distress, noted hypertension and vitals were otherwise normal.  Additional history obtained from husband at bedside, additional history obtained from chart review, most recent note from GI, mildly  hypertensive in office.  Given the episode of discomfort yesterday, check EKG, basic labs including troponin.  No acute ischemic changes on EKG, troponin within normal limits.  Doubt ACS.  Remainder of basic labs stable.  Chest x-ray negative per my review.  Provided her home blood pressure medicine, and reassessment BP has been mildly improved.  Given she is asymptomatic, feel that this can be managed in the outpatient setting and does not require admission today.  Recommend she continue the current BP regimen and have close follow-up with her primary care doctor this coming week.    After the discussed management above, the patient was determined to be safe for discharge.  The patient was in agreement with this plan and all questions regarding their care were answered.  ED return precautions were discussed and the patient will return to the ED with any significant worsening of condition.         Final Clinical Impression(s) / ED Diagnoses Final diagnoses:  Hypertension, unspecified type    Rx / DC Orders ED Discharge Orders     None         Lucrezia Starch, MD 10/30/21 1140

## 2021-11-01 DIAGNOSIS — J3089 Other allergic rhinitis: Secondary | ICD-10-CM | POA: Diagnosis not present

## 2021-11-02 DIAGNOSIS — J3089 Other allergic rhinitis: Secondary | ICD-10-CM | POA: Diagnosis not present

## 2021-11-04 DIAGNOSIS — H6593 Unspecified nonsuppurative otitis media, bilateral: Secondary | ICD-10-CM | POA: Diagnosis not present

## 2021-11-04 DIAGNOSIS — G629 Polyneuropathy, unspecified: Secondary | ICD-10-CM | POA: Diagnosis not present

## 2021-11-04 DIAGNOSIS — R42 Dizziness and giddiness: Secondary | ICD-10-CM | POA: Diagnosis not present

## 2021-11-04 DIAGNOSIS — F419 Anxiety disorder, unspecified: Secondary | ICD-10-CM | POA: Diagnosis not present

## 2021-11-04 DIAGNOSIS — D7589 Other specified diseases of blood and blood-forming organs: Secondary | ICD-10-CM | POA: Diagnosis not present

## 2021-11-04 DIAGNOSIS — E785 Hyperlipidemia, unspecified: Secondary | ICD-10-CM | POA: Diagnosis not present

## 2021-11-04 DIAGNOSIS — R002 Palpitations: Secondary | ICD-10-CM | POA: Diagnosis not present

## 2021-11-04 DIAGNOSIS — I10 Essential (primary) hypertension: Secondary | ICD-10-CM | POA: Diagnosis not present

## 2021-11-04 DIAGNOSIS — E039 Hypothyroidism, unspecified: Secondary | ICD-10-CM | POA: Diagnosis not present

## 2021-11-04 DIAGNOSIS — G610 Guillain-Barre syndrome: Secondary | ICD-10-CM | POA: Diagnosis not present

## 2021-11-04 DIAGNOSIS — F1010 Alcohol abuse, uncomplicated: Secondary | ICD-10-CM | POA: Diagnosis not present

## 2021-11-14 ENCOUNTER — Telehealth: Payer: Self-pay | Admitting: Internal Medicine

## 2021-11-14 MED ORDER — LINACLOTIDE 72 MCG PO CAPS: 72.0000 ug | ORAL_CAPSULE | Freq: Every day | ORAL | 3 refills | Status: DC

## 2021-11-14 NOTE — Telephone Encounter (Signed)
Maybe she can try the lower dose Linzess 72 mcg once daily or once every other day.

## 2021-11-14 NOTE — Telephone Encounter (Signed)
Pt states she was given Linzess 157mcg samples and has taken them. She reports she was taking it in the am and having diarrhea every other day. She reports it did help with the bloating quite a bit. She just isnt sure if the medicine is for her if she has diarrhea every other day. Pt wanted to know what Dr. Henrene Pastor recommends. Please advise.

## 2021-11-14 NOTE — Telephone Encounter (Signed)
Patient called and stated that Dr. Henrene Pastor had given her some samples of Linzess at her [facility] on 1/11. Stated that she is out and is not sure if it is working the way it should. Patient stated that she has diarrhea every other day. Seeking advice if Dr. Henrene Pastor is planning on giving her a proscription or not. Please advise.

## 2021-11-14 NOTE — Telephone Encounter (Signed)
Spoke with pt and she is aware, new script sent to pharmacy for pt. 

## 2021-11-15 DIAGNOSIS — J3089 Other allergic rhinitis: Secondary | ICD-10-CM | POA: Diagnosis not present

## 2021-11-15 DIAGNOSIS — F900 Attention-deficit hyperactivity disorder, predominantly inattentive type: Secondary | ICD-10-CM | POA: Diagnosis not present

## 2021-11-15 DIAGNOSIS — F1010 Alcohol abuse, uncomplicated: Secondary | ICD-10-CM | POA: Diagnosis not present

## 2021-11-15 DIAGNOSIS — F331 Major depressive disorder, recurrent, moderate: Secondary | ICD-10-CM | POA: Diagnosis not present

## 2021-11-22 DIAGNOSIS — I10 Essential (primary) hypertension: Secondary | ICD-10-CM | POA: Diagnosis not present

## 2021-11-22 DIAGNOSIS — E785 Hyperlipidemia, unspecified: Secondary | ICD-10-CM | POA: Diagnosis not present

## 2021-11-22 DIAGNOSIS — R002 Palpitations: Secondary | ICD-10-CM | POA: Diagnosis not present

## 2021-11-22 DIAGNOSIS — R42 Dizziness and giddiness: Secondary | ICD-10-CM | POA: Diagnosis not present

## 2021-11-22 DIAGNOSIS — F419 Anxiety disorder, unspecified: Secondary | ICD-10-CM | POA: Diagnosis not present

## 2021-11-22 DIAGNOSIS — E039 Hypothyroidism, unspecified: Secondary | ICD-10-CM | POA: Diagnosis not present

## 2021-11-28 ENCOUNTER — Telehealth: Payer: Self-pay | Admitting: Internal Medicine

## 2021-11-28 DIAGNOSIS — J3089 Other allergic rhinitis: Secondary | ICD-10-CM | POA: Diagnosis not present

## 2021-11-28 NOTE — Telephone Encounter (Signed)
Patient needs a refill on her pantoprazole.  She uses [facility].  Thank you.

## 2021-11-29 MED ORDER — PANTOPRAZOLE SODIUM 40 MG PO TBEC: 40.0000 mg | DELAYED_RELEASE_TABLET | Freq: Two times a day (BID) | ORAL | 3 refills | Status: DC

## 2021-11-29 NOTE — Telephone Encounter (Signed)
Refilled Pantoprazole 

## 2021-12-06 DIAGNOSIS — J029 Acute pharyngitis, unspecified: Secondary | ICD-10-CM | POA: Diagnosis not present

## 2021-12-06 DIAGNOSIS — Z1152 Encounter for screening for COVID-19: Secondary | ICD-10-CM | POA: Diagnosis not present

## 2021-12-06 DIAGNOSIS — U071 COVID-19: Secondary | ICD-10-CM | POA: Diagnosis not present

## 2021-12-06 DIAGNOSIS — G610 Guillain-Barre syndrome: Secondary | ICD-10-CM | POA: Diagnosis not present

## 2021-12-06 DIAGNOSIS — I10 Essential (primary) hypertension: Secondary | ICD-10-CM | POA: Diagnosis not present

## 2021-12-06 DIAGNOSIS — R059 Cough, unspecified: Secondary | ICD-10-CM | POA: Diagnosis not present

## 2021-12-06 DIAGNOSIS — R0981 Nasal congestion: Secondary | ICD-10-CM | POA: Diagnosis not present

## 2021-12-12 ENCOUNTER — Encounter (HOSPITAL_BASED_OUTPATIENT_CLINIC_OR_DEPARTMENT_OTHER): Payer: Self-pay | Admitting: Cardiovascular Disease

## 2021-12-12 ENCOUNTER — Other Ambulatory Visit: Payer: Self-pay

## 2021-12-12 ENCOUNTER — Ambulatory Visit (INDEPENDENT_AMBULATORY_CARE_PROVIDER_SITE_OTHER): Payer: PPO

## 2021-12-12 ENCOUNTER — Ambulatory Visit (HOSPITAL_BASED_OUTPATIENT_CLINIC_OR_DEPARTMENT_OTHER): Payer: PPO | Admitting: Cardiovascular Disease

## 2021-12-12 DIAGNOSIS — R002 Palpitations: Secondary | ICD-10-CM | POA: Diagnosis not present

## 2021-12-12 DIAGNOSIS — I10 Essential (primary) hypertension: Secondary | ICD-10-CM | POA: Diagnosis not present

## 2021-12-12 DIAGNOSIS — R079 Chest pain, unspecified: Secondary | ICD-10-CM

## 2021-12-12 DIAGNOSIS — E7800 Pure hypercholesterolemia, unspecified: Secondary | ICD-10-CM | POA: Diagnosis not present

## 2021-12-12 HISTORY — DX: Palpitations: R00.2

## 2021-12-12 MED ORDER — CHLORTHALIDONE 25 MG PO TABS: 25.0000 mg | ORAL_TABLET | Freq: Every day | ORAL | 3 refills | Status: DC

## 2021-12-12 NOTE — Progress Notes (Signed)
Cardiology Office Note   Date:  12/13/2021   ID:  Brianna Gates, DOB 1951/07/11, MRN [id number]  PCP:  Prince Solian, MD  Cardiologist:   Skeet Latch, MD   No chief complaint on file.   History of Present Illness: Brianna Gates is a 71 y.o. female with hypertension, hyperlipidemia, alcohol abuse, hypothyroidism who is being seen today for the evaluation of hypertension at the request of Reginold Agent, NP.  She saw her PCP on 10/2021 and reported labile blood pressures.  At that time she was on nebivolol and losartan.  Her losartan has been increased and her blood pressures remained labile.  They noted that she had not always been resting prior to checking her blood pressure.  They also noted alcohol intake and she had been using naltrexone to reduce her intake.  She also noted some exertional symptoms and was referred to cardiology for palpitations, hypertension, and dizziness  She first found that her BP was elevated at a screening at her health center in her 40s. She has struggled to control her BP.  She had an episode where her blood pressure at home read  over 357 systolic so she went to the emergency department.  By the time she arrived in the ED it was 174/73.  She reported feeling chest pressure and heaviness.  Felt something was sitting on her chest.  Cardiac enzymes were negative and EKG was unremarkable so she was discharged and encouraged to follow-up with cardiology.  She exercises almost daily and has not had any exertional chest pain or pressure.  She denies any shortness of breath, lower extremity edema, orthopnea, or PND.  She has been limiting her salt intake and no longer drinks alcohol.  She drinks 2-3 coffees daily.  She has been struggling with anxiety that improved slightly with the increase in her Cymbalta.  She is also been experiencing heart palpitations.  It first started when she had a sinus infection in fall 2022.  When it occurs it last for about  10 seconds at a time and typically occurs almost daily.  She does not usually have any lightheadedness, chest pain, or shortness of breath with the palpitations.  Prior antihypertensives:  Amlodipine- palpitations ACE-I-cough  Past Medical History:  Diagnosis Date   Allergy    Anxiety    Asthma    INFREQUENT PROBLEM - rarely used inhaler   Cervical spondylosis    Cervical strain 07/22/2014   Chest pain of uncertain etiology 0/17/7939   Complication of anesthesia    UNKNOWN REACTION WITH RASH AT SITE OF IV only with knee surgery   Degenerative arthritis    Dyslipidemia    Eczema    GERD (gastroesophageal reflux disease)    Guillain-Barre syndrome (HCC)    History of Guillain-Barre syndrome 07/22/2014   Hyperlipidemia    Hypertension    Hypothyroidism    Lumbosacral spondylosis    Palpitations 12/12/2021   Pure hypercholesterolemia 12/13/2021   Raynaud disease    Thyroid nodule     Past Surgical History:  Procedure Laterality Date   ABDOMINAL HYSTERECTOMY     COLONOSCOPY     ESOPHAGOGASTRODUODENOSCOPY (EGD) WITH PROPOFOL N/A 09/13/2021   Procedure: ESOPHAGOGASTRODUODENOSCOPY (EGD) WITH PROPOFOL;  Surgeon: Irene Shipper, MD;  Location: [facility];  Service: Endoscopy;  Laterality: N/A;   FOOT SURGERY Left    GANGLION CYST EXCISION Left    Wrist   HERNIA REPAIR     KNEE ARTHROSCOPY  RT KNEE   KNEE ARTHROTOMY Right 08/27/2013   Procedure: RIGHT KNEE SCAR EXCISION AND FEMORAL REVISION;  Surgeon: Gearlean Alf, MD;  Location: [facility];  Service: Orthopedics;  Laterality: Right;   REPLACEMENT TOTAL KNEE Right    2014, 2021   REPLACEMENT TOTAL KNEE BILATERAL Bilateral    STRABISMUS SURGERY Right    TONSILLECTOMY     vaginal vulvo prolapse  09/16/2015     Current Outpatient Medications  Medication Sig Dispense Refill   acetaminophen (TYLENOL) 650 MG CR tablet Take 1,300 mg by mouth every 8 (eight) hours as needed for pain or fever.     albuterol (PROVENTIL  HFA;VENTOLIN HFA) 108 (90 BASE) MCG/ACT inhaler Inhale 2 puffs into the lungs every 6 (six) hours as needed for wheezing or shortness of breath.     amoxicillin (AMOXIL) 500 MG capsule Take 2,000 mg by mouth once as needed (prior to dental appointments).     Ascorbic Acid (VITAMIN C PO) Take 1 tablet by mouth daily as needed (sick symptoms).     azelastine (ASTELIN) 0.1 % nasal spray Place 1 spray into both nostrils 2 (two) times daily as needed (cold symptoms).     chlorthalidone (HYGROTON) 25 MG tablet Take 1 tablet (25 mg total) by mouth daily. 90 tablet 3   Cholecalciferol (VITAMIN D) 2000 UNITS CAPS Take 2,000 Units by mouth daily in the afternoon.     DULoxetine (CYMBALTA) 60 MG capsule Take 120 mg by mouth daily.     Emollient (CETAPHIL) cream Apply 1 application topically daily.     EPINEPHrine 0.3 mg/0.3 mL IJ SOAJ injection Inject 0.3 mg into the muscle once as needed for anaphylaxis.     estradiol (ESTRACE) 0.5 MG tablet Take 0.5 mg by mouth daily.     fexofenadine (ALLEGRA) 180 MG tablet Take 180 mg by mouth as needed.     fluticasone (FLONASE) 50 MCG/ACT nasal spray Place 1 spray into both nostrils daily as needed.     guaiFENesin (MUCINEX) 600 MG 12 hr tablet Take 1,200 mg by mouth daily as needed.     ibuprofen (ADVIL,MOTRIN) 100 MG tablet Take 400 mg by mouth daily at 12 noon. May take in the pm additionally as needed     ketoconazole (NIZORAL) 2 % cream Apply 1 application topically daily. (Patient taking differently: Apply 1 application topically daily as needed.) 60 g 0   levothyroxine (SYNTHROID) 50 MCG tablet Take 50 mcg by mouth daily before breakfast.      linaclotide (LINZESS) 72 MCG capsule Take 1 capsule (72 mcg total) by mouth daily before breakfast. 30 capsule 3   losartan (COZAAR) 50 MG tablet Take 50 mg by mouth 2 (two) times daily.     montelukast (SINGULAIR) 10 MG tablet Take 10 mg by mouth at bedtime.     naltrexone (DEPADE) 50 MG tablet TAKE ONE TABLET IN THE  MORNING     pantoprazole (PROTONIX) 40 MG tablet Take 40 mg by mouth daily.     Propylene Glycol-Glycerin (SOOTHE) 0.6-0.6 % SOLN Place 1 drop into both eyes daily.     rosuvastatin (CRESTOR) 5 MG tablet Take 5 mg by mouth every other day.     Simethicone (PHAZYME PO) Take 250 mg by mouth daily as needed (Gas).     triamcinolone cream (KENALOG) 0.1 % 1 application as needed (itching bumps on stomach). Mixed with Cetaphil     No current facility-administered medications for this visit.    Allergies:  Buspar [buspirone], Captopril, Oxaprozin, Ramipril, Zoloft [sertraline hcl], Ace inhibitors, Doxycycline, Influenza vaccines, and Sulfa antibiotics    Social History:  The patient  reports that she has never smoked. She has never used smokeless tobacco. She reports current alcohol use of about 6.0 standard drinks per week. She reports that she does not use drugs.   Family History:  The patient's family history includes Heart attack in her father; Heart disease in her father; Hemochromatosis in her brother and father; Hyperlipidemia in her brother; Hypertension in her brother and mother; Other in her brother and mother; Stomach cancer in her paternal uncle; Stroke in her mother.    ROS:  Please see the history of present illness.   Otherwise, review of systems are positive for none.   All other systems are reviewed and negative.    PHYSICAL EXAM: VS:  BP (!) 168/86 (BP Location: Left Arm, Patient Position: Sitting, Cuff Size: Normal)    Pulse (!) 52    Ht 5' 4.5" (1.638 m)    Wt 130 lb 8 oz (59.2 kg)    BMI 22.05 kg/m  , BMI Body mass index is 22.05 kg/m. GENERAL:  Well appearing HEENT:  Pupils equal round and reactive, fundi not visualized, oral mucosa unremarkable NECK:  No jugular venous distention, waveform within normal limits, carotid upstroke brisk and symmetric, no bruits, no thyromegaly LUNGS:  Clear to auscultation bilaterally HEART:  RRR.  PMI not displaced or sustained,S1 and S2  within normal limits, no S3, no S4, no clicks, no rubs, no murmurs ABD:  Flat, positive bowel sounds normal in frequency in pitch, no bruits, no rebound, no guarding, no midline pulsatile mass, no hepatomegaly, no splenomegaly EXT:  2 plus pulses throughout, no edema, no cyanosis no clubbing SKIN:  No rashes no nodules NEURO:  Cranial nerves II through XII grossly intact, motor grossly intact throughout PSYCH:  Cognitively intact, oriented to person place and time   EKG:  EKG is ordered today. The ekg ordered today demonstrates sinus bradycardia.  Rate 52 bpm.     Recent Labs: 12/27/2020: Magnesium 2.0; TSH 1.068 10/30/2021: BUN 18; Creatinine, Ser 0.71; Hemoglobin 14.4; Platelets 166; Potassium 4.1; Sodium 140    Lipid Panel No results found for: CHOL, TRIG, HDL, CHOLHDL, VLDL, LDLCALC, LDLDIRECT    Wt Readings from Last 3 Encounters:  12/12/21 130 lb 8 oz (59.2 kg)  10/26/21 130 lb 9.6 oz (59.2 kg)  10/06/21 131 lb 8 oz (59.6 kg)      ASSESSMENT AND PLAN:  Essential hypertension Blood pressure remains above goal.  It did improve on repeat.  She is also bradycardic to 52.  We will stop the nebivolol and add chlorthalidone 25 mg daily.  Check a basic metabolic panel in a week.  Continue losartan.  She will continue to abstain from alcohol.  I do wonder if Cymbalta could be contributing to her hypertension.  Palpitations Work on limiting caffeine intake.  We will get a 3-day monitor to better assess.  Chest pain of uncertain etiology Ms. Buckbee exercises almost daily and has no exertional symptoms.  I do not think that her chest pain is ischemic.  We will focus on blood pressure control as above.  If she continues to have discomfort with better control blood pressures we will plan for ischemic evaluation.  Pure hypercholesterolemia Continue rosuvastatin.   Current medicines are reviewed at length with the patient today.  The patient  concerns regarding medicines.  The  following changes have  been made:    Labs/ tests ordered today include:   Orders Placed This Encounter  Procedures   Basic metabolic panel   LONG TERM MONITOR (3-14 DAYS)   EKG 12-Lead     Disposition:   FU with Merlin Golden C. Oval Linsey, MD, [facility] in 4 months.  APP 1 month     Signed, Jenalyn Girdner C. Oval Linsey, MD, [facility]  12/13/2021 10:32 AM    [place]

## 2021-12-12 NOTE — Assessment & Plan Note (Signed)
Work on limiting caffeine intake.  We will get a 3-day monitor to better assess.

## 2021-12-12 NOTE — Assessment & Plan Note (Addendum)
Blood pressure remains above goal.  It did improve on repeat.  She is also bradycardic to 52.  We will stop the nebivolol and add chlorthalidone 25 mg daily.  Check a basic metabolic panel in a week.  Continue losartan.  She will continue to abstain from alcohol.  I do wonder if Cymbalta could be contributing to her hypertension.

## 2021-12-12 NOTE — Patient Instructions (Addendum)
Medication Instructions:  STOP NEBIVILOL   START CHLORTHALIDONE 25 MG DAILY   *If you need a refill on your cardiac medications before your next appointment, please call your pharmacy*  Lab Work: BMET IN 1 WEEK   If you have labs (blood work) drawn today and your tests are completely normal, you will receive your results only by: [place] (if you have MyChart) OR A paper copy in the mail If you have any lab test that is abnormal or we need to change your treatment, we will call you to review the results.  Testing/Procedures: 3 DAY ZIO   Follow-Up: At [facility], you and your health needs are our priority.  As part of our continuing mission to provide you with exceptional heart care, we have created designated Provider Care Teams.  These Care Teams include your primary Cardiologist (physician) and Advanced Practice Providers (APPs -  Physician Assistants and Nurse Practitioners) who all work together to provide you with the care you need, when you need it.  We recommend signing up for the patient portal called "MyChart".  Sign up information is provided on this After Visit Summary.  MyChart is used to connect with patients for Virtual Visits (Telemedicine).  Patients are able to view lab/test results, encounter notes, upcoming appointments, etc.  Non-urgent messages can be sent to your provider as well.   To learn more about what you can do with MyChart, go to [email or web address].    Your next appointment:   4 month(s)  The format for your next appointment:   In Person  Provider:   Skeet Latch, MD   Your physician recommends that you schedule a follow-up appointment in: [place] NP OR JESSE C NP

## 2021-12-13 ENCOUNTER — Encounter (HOSPITAL_BASED_OUTPATIENT_CLINIC_OR_DEPARTMENT_OTHER): Payer: Self-pay | Admitting: Cardiovascular Disease

## 2021-12-13 ENCOUNTER — Ambulatory Visit: Payer: PPO | Admitting: Podiatry

## 2021-12-13 DIAGNOSIS — E7800 Pure hypercholesterolemia, unspecified: Secondary | ICD-10-CM

## 2021-12-13 DIAGNOSIS — B351 Tinea unguium: Secondary | ICD-10-CM

## 2021-12-13 DIAGNOSIS — F331 Major depressive disorder, recurrent, moderate: Secondary | ICD-10-CM | POA: Diagnosis not present

## 2021-12-13 DIAGNOSIS — M79675 Pain in left toe(s): Secondary | ICD-10-CM

## 2021-12-13 DIAGNOSIS — M79674 Pain in right toe(s): Secondary | ICD-10-CM

## 2021-12-13 DIAGNOSIS — L600 Ingrowing nail: Secondary | ICD-10-CM | POA: Diagnosis not present

## 2021-12-13 DIAGNOSIS — R079 Chest pain, unspecified: Secondary | ICD-10-CM

## 2021-12-13 HISTORY — DX: Pure hypercholesterolemia, unspecified: E78.00

## 2021-12-13 HISTORY — DX: Chest pain, unspecified: R07.9

## 2021-12-13 NOTE — Assessment & Plan Note (Signed)
Ms. Brianna Gates exercises almost daily and has no exertional symptoms.  I do not think that her chest pain is ischemic.  We will focus on blood pressure control as above.  If she continues to have discomfort with better control blood pressures we will plan for ischemic evaluation.

## 2021-12-13 NOTE — Assessment & Plan Note (Signed)
Continue rosuvastatin.  

## 2021-12-14 ENCOUNTER — Encounter (HOSPITAL_BASED_OUTPATIENT_CLINIC_OR_DEPARTMENT_OTHER): Payer: Self-pay

## 2021-12-14 NOTE — Progress Notes (Signed)
Subjective: 71 year old female presents the office today for follow-up evaluation of ingrown toenail to her big toes nails becoming thickened discolored.  She did notice some white discoloration to the nail after pedicure but that is much improved.  No pain associate with this and no drainage or pus.  No pain at the base of the nail which noticed the discoloration.  Objective: AAO x3, NAD DP/PT pulses palpable bilaterally, CRT less than 3 seconds Mild incurvation to the hallux toenails bilaterally and the hallux nails are mildly hypertrophic, dystrophic.  No edema, erythema and signs of infection noted today.   No pain with calf compression, swelling, warmth, erythema  Assessment: Chronic ingrown toenails  Plan: -All treatment options discussed with the patient including all alternatives, risks, complications.  -Patient debrided nails x2 without any complications or bleeding.  She would like to hold off on partial nail avulsions. -Daily foot inspection.  Monitoring signs or symptoms of infection.  Return in about 9 weeks (around 02/14/2022).  Trula Slade DPM

## 2021-12-15 DIAGNOSIS — J3089 Other allergic rhinitis: Secondary | ICD-10-CM | POA: Diagnosis not present

## 2021-12-15 DIAGNOSIS — I7300 Raynaud's syndrome without gangrene: Secondary | ICD-10-CM | POA: Diagnosis not present

## 2021-12-15 DIAGNOSIS — M150 Primary generalized (osteo)arthritis: Secondary | ICD-10-CM | POA: Diagnosis not present

## 2021-12-15 DIAGNOSIS — M3500 Sicca syndrome, unspecified: Secondary | ICD-10-CM | POA: Diagnosis not present

## 2021-12-15 DIAGNOSIS — Z6821 Body mass index (BMI) 21.0-21.9, adult: Secondary | ICD-10-CM | POA: Diagnosis not present

## 2021-12-16 DIAGNOSIS — I10 Essential (primary) hypertension: Secondary | ICD-10-CM | POA: Diagnosis not present

## 2021-12-16 LAB — BASIC METABOLIC PANEL
BUN/Creatinine Ratio: 15 (ref 12–28)
BUN: 14 mg/dL (ref 8–27)
CO2: 27 mmol/L (ref 20–29)
Calcium: 9.9 mg/dL (ref 8.7–10.3)
Chloride: 106 mmol/L (ref 96–106)
Creatinine, Ser: 0.96 mg/dL (ref 0.57–1.00)
Glucose: 118 mg/dL — ABNORMAL HIGH (ref 70–99)
Potassium: 4.3 mmol/L (ref 3.5–5.2)
Sodium: 149 mmol/L — ABNORMAL HIGH (ref 134–144)
eGFR: 63 mL/min/{1.73_m2} (ref >59)

## 2021-12-23 ENCOUNTER — Other Ambulatory Visit: Payer: Self-pay | Admitting: *Deleted

## 2021-12-23 DIAGNOSIS — I10 Essential (primary) hypertension: Secondary | ICD-10-CM

## 2021-12-23 DIAGNOSIS — R002 Palpitations: Secondary | ICD-10-CM | POA: Diagnosis not present

## 2021-12-23 DIAGNOSIS — E870 Hyperosmolality and hypernatremia: Secondary | ICD-10-CM

## 2022-01-04 ENCOUNTER — Telehealth (HOSPITAL_BASED_OUTPATIENT_CLINIC_OR_DEPARTMENT_OTHER): Payer: Self-pay | Admitting: Cardiovascular Disease

## 2022-01-04 DIAGNOSIS — J3089 Other allergic rhinitis: Secondary | ICD-10-CM | POA: Diagnosis not present

## 2022-01-04 NOTE — Telephone Encounter (Signed)
Advised patient of lab results  ?She will have labs repeated at visit next week  ?

## 2022-01-04 NOTE — Telephone Encounter (Signed)
Patient returning your call.

## 2022-01-04 NOTE — Telephone Encounter (Signed)
-----   Message from Skeet Latch, MD sent at 12/22/2021  2:54 PM EST ----- ?Sodium is a little high.  Kidney function is normal.  This usually happens when someone is dry and dehydrated.  Try to increase fluid intake.  Repeat BMP in a week. ?

## 2022-01-04 NOTE — Telephone Encounter (Signed)
Follow Up: ? ? ?Patient is returning Melinda's calls. She have been out of the country, just receiving the messages, ?

## 2022-01-09 DIAGNOSIS — Z9889 Other specified postprocedural states: Secondary | ICD-10-CM | POA: Diagnosis not present

## 2022-01-09 DIAGNOSIS — K219 Gastro-esophageal reflux disease without esophagitis: Secondary | ICD-10-CM | POA: Diagnosis not present

## 2022-01-09 DIAGNOSIS — J329 Chronic sinusitis, unspecified: Secondary | ICD-10-CM | POA: Diagnosis not present

## 2022-01-09 DIAGNOSIS — J342 Deviated nasal septum: Secondary | ICD-10-CM | POA: Diagnosis not present

## 2022-01-09 DIAGNOSIS — Z9089 Acquired absence of other organs: Secondary | ICD-10-CM | POA: Diagnosis not present

## 2022-01-09 DIAGNOSIS — J309 Allergic rhinitis, unspecified: Secondary | ICD-10-CM | POA: Diagnosis not present

## 2022-01-10 ENCOUNTER — Other Ambulatory Visit: Payer: Self-pay

## 2022-01-10 ENCOUNTER — Encounter (HOSPITAL_BASED_OUTPATIENT_CLINIC_OR_DEPARTMENT_OTHER): Payer: Self-pay | Admitting: Family

## 2022-01-10 ENCOUNTER — Ambulatory Visit (HOSPITAL_BASED_OUTPATIENT_CLINIC_OR_DEPARTMENT_OTHER): Payer: PPO | Admitting: Family

## 2022-01-10 VITALS — BP 116/74 | HR 62 | Ht 64.5 in | Wt 130.6 lb

## 2022-01-10 DIAGNOSIS — I10 Essential (primary) hypertension: Secondary | ICD-10-CM | POA: Diagnosis not present

## 2022-01-10 DIAGNOSIS — E782 Mixed hyperlipidemia: Secondary | ICD-10-CM | POA: Diagnosis not present

## 2022-01-10 DIAGNOSIS — R002 Palpitations: Secondary | ICD-10-CM

## 2022-01-10 MED ORDER — LOSARTAN POTASSIUM 50 MG PO TABS: 50.0000 mg | ORAL_TABLET | Freq: Two times a day (BID) | ORAL | 1 refills | Status: DC

## 2022-01-10 MED ORDER — ROSUVASTATIN CALCIUM 5 MG PO TABS: 5.0000 mg | ORAL_TABLET | ORAL | 1 refills | Status: DC

## 2022-01-10 NOTE — Patient Instructions (Addendum)
Medication Instructions:  ?Continue your current medications.  ? ?*If you need a refill on your cardiac medications before your next appointment, please call your pharmacy* ? ?Lab Work: ?Your physician recommends that you return for lab work today: BMP ? ?Testing/Procedures: ?Your EKG today showed normal sinus rhythm which is a good result! ? ?The preliminary report of your monitor showed mostly normal sinus rhythm which is a good result! There was one very short episode of a fast heart rate that is not dangerous and not of concern. ? ?Follow-Up: ?At [facility], you and your health needs are our priority.  As part of our continuing mission to provide you with exceptional heart care, we have created designated Provider Care Teams.  These Care Teams include your primary Cardiologist (physician) and Advanced Practice Providers (APPs -  Physician Assistants and Nurse Practitioners) who all work together to provide you with the care you need, when you need it. ? ?We recommend signing up for the patient portal called "MyChart".  Sign up information is provided on this After Visit Summary.  MyChart is used to connect with patients for Virtual Visits (Telemedicine).  Patients are able to view lab/test results, encounter notes, upcoming appointments, etc.  Non-urgent messages can be sent to your provider as well.   ?To learn more about what you can do with MyChart, go to [email or web address].   ? ?Your next appointment:   ?As scheduled with Dr. Oval Linsey ? ? ?Other Instructions ? ?For dry mouth: ?Please try Biotene Dry Mouth rinse ?A piece of gum or peppermint or hard candy to help keep the mouth moist ?If this does not improve your dry mouth over the next 2 weeks then send [place] a MyChart message and we can consider reducing your dose of Chlorthalidone ? ?To prevent palpitations: ?Make sure you are adequately hydrated.  ?Avoid and/or limit caffeine containing beverages like soda or tea. ?Exercise regularly.  ?Manage  stress well. ?Some over the counter medications can cause palpitations such as Benadryl, AdvilPM, TylenolPM. Regular Advil or Tylenol do not cause palpitations.   ? ?Tips to Measure your Blood Pressure Correctly ? ?Here's what you can do to ensure a correct reading: ? Don't drink a caffeinated beverage or smoke during the 30 minutes before the test. ? Sit quietly for five minutes before the test begins. ? During the measurement, sit in a chair with your feet on the floor and your arm supported so your elbow is at about heart level. ? The inflatable part of the cuff should completely cover at least 80% of your upper arm, and the cuff should be placed on bare skin, not over a shirt. ? Don't talk during the measurement. ? Have your blood pressure measured twice, with a brief break in between. If the readings are different by 5 points or more, have it done a third time. ? ?Blood pressure categories  ?Blood pressure category SYSTOLIC ?(upper number)  DIASTOLIC ?(lower number)  ?Normal Less than 120 mm Hg and Less than 80 mm Hg  ?Elevated 120-129 mm Hg and Less than 80 mm Hg  ?High blood pressure: Stage 1 hypertension 130-139 mm Hg or 80-89 mm Hg  ?High blood pressure: Stage 2 hypertension 140 mm Hg or higher or 90 mm Hg or higher  ?Hypertensive crisis (consult your doctor immediately) Higher than 180 mm Hg and/or Higher than 120 mm Hg  ?Source: American Heart Association and American Stroke Association. ?For more on getting your blood pressure under control, buy Controlling Your  Blood Pressure, a Special Health Report from [facility]. ?   ?

## 2022-01-10 NOTE — Progress Notes (Signed)
? ?Office Visit  ?  ?Patient Name: Brianna Gates ?Date of Encounter: 01/10/2022 ? ?PCP:  Prince Solian, MD ?  ?[place]  ?Cardiologist:  Skeet Latch, MD  ?Advanced Practice Provider:  No care team member to display ?Electrophysiologist:  None  ?   ? ?Chief Complaint  ?  ?Brianna Gates is a 71 y.o. female with a hx of hypertension, hyperlipidemia, alcohol abuse, hypothyroidism presents today for hypertension follow-up ? ?Past Medical History  ?  ?Past Medical History:  ?Diagnosis Date  ? Allergy   ? Anxiety   ? Asthma   ? INFREQUENT PROBLEM - rarely used inhaler  ? Cervical spondylosis   ? Cervical strain 07/22/2014  ? Chest pain of uncertain etiology 2/99/2426  ? Complication of anesthesia   ? UNKNOWN REACTION WITH RASH AT SITE OF IV only with knee surgery  ? Degenerative arthritis   ? Dyslipidemia   ? Eczema   ? GERD (gastroesophageal reflux disease)   ? 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 History of Guillain-Barre syndrome 07/22/2014  ? Hyperlipidemia   ? Hypertension   ? Hypothyroidism   ? Lumbosacral spondylosis   ? Palpitations 12/12/2021  ? Pure hypercholesterolemia 12/13/2021  ? Raynaud disease   ? Thyroid nodule   ? ?Past Surgical History:  ?Procedure Laterality Date  ? ABDOMINAL HYSTERECTOMY    ? COLONOSCOPY    ? ESOPHAGOGASTRODUODENOSCOPY (EGD) WITH PROPOFOL N/A 09/13/2021  ? Procedure: ESOPHAGOGASTRODUODENOSCOPY (EGD) WITH PROPOFOL;  Surgeon: Irene Shipper, MD;  Location: [facility];  Service: Endoscopy;  Laterality: N/A;  ? FOOT SURGERY Left   ? GANGLION CYST EXCISION Left   ? Wrist  ? HERNIA REPAIR    ? KNEE ARTHROSCOPY    ? RT KNEE  ? KNEE ARTHROTOMY Right 08/27/2013  ? Procedure: RIGHT KNEE SCAR EXCISION AND FEMORAL REVISION;  Surgeon: Gearlean Alf, MD;  Location: [facility];  Service: Orthopedics;  Laterality: Right;  ? REPLACEMENT TOTAL KNEE Right   ? 2014, 2021  ? REPLACEMENT TOTAL KNEE BILATERAL Bilateral   ? STRABISMUS SURGERY Right   ? TONSILLECTOMY    ?  vaginal vulvo prolapse  09/16/2015  ? ? ?Allergies ? ?Allergies  ?Allergen Reactions  ? Buspar [Buspirone] Itching  ?  Vivid dreams, diarrhea ?  ? Captopril Other (See Comments)  ?  reaction  ? Oxaprozin Hives and Other (See Comments)  ? Ramipril Cough and Other (See Comments)  ? Zoloft [Sertraline Hcl] Diarrhea  ? Ace Inhibitors   ?  Other reaction(s): Other (See Comments) ?GB syndrome  ? Doxycycline Other (See Comments)  ?  Burning sensation/Rash  ? Influenza Vaccines   ?  HX   ? Sulfa Antibiotics Other (See Comments)  ?  unknown  ? ? ?History of Present Illness  ?  ?Brianna Gates is a 71 y.o. female with a hx of hypertension, hyperlipidemia, alcohol abuse, hypothyroidism last seen 12/12/2021 by Dr. Oval Linsey. ? ?She was seen by primary care January 2023 and noted labile blood pressures.  She was on nebivolol and losartan at that time.  Her losartan has been increased and her blood pressure remains labile. ? ?She was seen 12/12/2021.  Her nebivolol was transitioned to chlorthalidone and losartan continued at 50 mg twice daily.  She was recommended for 3-day monitor due to palpitations. ? ?Prior antihypertensives: ?Amlodipine-palpitations  ?ACE-cough ?Nebivolol-ineffective ? ?Presents today for follow up. Preliminary report of monitor with predominantly NSR with <1% PVC/PAC burden. She had one short  episode of SVT which was triggered episode but remainder of her triggered events associated with NSR. BP has been much better controlled at home. Continues ot follow with psychiatry for assistance with anxiety. Chief concern of dry mouth. Reports no shortness of breath nor dyspnea on exertion. Reports no chest pain, pressure, or tightness. No edema, orthopnea, PND. Reports no palpitations.   ? ?EKGs/Labs/Other Studies Reviewed:  ? ?The following studies were reviewed today: ? ?11/2021 Monitor preliminary report ?Patch Wear Time:  2 days and 23 hours (2023-02-27T15:58:02-499 to 2023-03-02T15:18:28-0500) ?  ?Patient had  a min HR of 44 bpm, max HR of 164 bpm, and avg HR of 58 bpm. Predominant underlying rhythm was Sinus Rhythm. 1 run of Supraventricular Tachycardia occurred lasting 6 beats with a max rate of 164 bpm (avg 156 bpm). Supraventricular Tachycardia ? was detected within +/- 45 seconds of symptomatic patient event(s). Isolated SVEs were rare (<1.0%), SVE Couplets were rare (<1.0%), and SVE Triplets were rare (<1.0%). Isolated VEs were rare (<1.0%), and no VE Couplets or VE Triplets were present.  ?EKG:  EKG is ordered today.  The ekg ordered today demonstrates NSR 62 bpm with no acute ST/T wave changes.  ? ?Recent Labs: ?10/30/2021: Hemoglobin 14.4; Platelets 166 ?12/16/2021: BUN 14; Creatinine, Ser 0.96; Potassium 4.3; Sodium 149  ?Recent Lipid Panel ?No results found for: CHOL, TRIG, HDL, CHOLHDL, VLDL, LDLCALC, LDLDIRECT ? ? ?Home Medications  ? ?Current Meds  ?Medication Sig  ? acetaminophen (TYLENOL) 650 MG CR tablet Take 1,300 mg by mouth every 8 (eight) hours as needed for pain or fever.  ? albuterol (PROVENTIL HFA;VENTOLIN HFA) 108 (90 BASE) MCG/ACT inhaler Inhale 2 puffs into the lungs every 6 (six) hours as needed for wheezing or shortness of breath.  ? amoxicillin (AMOXIL) 500 MG capsule Take 2,000 mg by mouth once as needed (prior to dental appointments).  ? Ascorbic Acid (VITAMIN C PO) Take 1 tablet by mouth daily as needed (sick symptoms).  ? azelastine (ASTELIN) 0.1 % nasal spray Place 1 spray into both nostrils 2 (two) times daily as needed (cold symptoms).  ? chlorthalidone (HYGROTON) 25 MG tablet Take 1 tablet (25 mg total) by mouth daily.  ? Cholecalciferol (VITAMIN D) 2000 UNITS CAPS Take 2,000 Units by mouth daily in the afternoon.  ? DULoxetine (CYMBALTA) 60 MG capsule Take 120 mg by mouth daily.  ? Emollient (CETAPHIL) cream Apply 1 application topically daily.  ? EPINEPHrine 0.3 mg/0.3 mL IJ SOAJ injection Inject 0.3 mg into the muscle once as needed for anaphylaxis.  ? estradiol (ESTRACE) 0.5 MG  tablet Take 0.5 mg by mouth daily.  ? guaiFENesin (MUCINEX) 600 MG 12 hr tablet Take 1,200 mg by mouth daily as needed.  ? ibuprofen (ADVIL,MOTRIN) 100 MG tablet Take 400 mg by mouth daily at 12 noon. May take in the pm additionally as needed  ? ketoconazole (NIZORAL) 2 % cream Apply 1 application topically daily. (Patient taking differently: Apply 1 application. topically daily as needed.)  ? levothyroxine (SYNTHROID) 50 MCG tablet Take 50 mcg by mouth daily before breakfast.   ? linaclotide (LINZESS) 72 MCG capsule Take 1 capsule (72 mcg total) by mouth daily before breakfast.  ? loratadine (CLARITIN) 10 MG tablet Take 10 mg by mouth daily.  ? montelukast (SINGULAIR) 10 MG tablet Take 10 mg by mouth at bedtime.  ? naltrexone (DEPADE) 50 MG tablet TAKE ONE TABLET IN THE MORNING  ? pantoprazole (PROTONIX) 40 MG tablet Take 40 mg by mouth daily.  ?  Propylene Glycol-Glycerin (SOOTHE) 0.6-0.6 % SOLN Place 1 drop into both eyes daily.  ? Simethicone (PHAZYME PO) Take 250 mg by mouth daily as needed (Gas).  ? triamcinolone cream (KENALOG) 0.1 % 1 application as needed (itching bumps on stomach). Mixed with Cetaphil  ? [DISCONTINUED] losartan (COZAAR) 50 MG tablet Take 50 mg by mouth 2 (two) times daily.  ? [DISCONTINUED] rosuvastatin (CRESTOR) 5 MG tablet Take 5 mg by mouth every other day.  ?  ? ?Review of Systems  ?    ?All other systems reviewed and are otherwise negative except as noted above. ? ?Physical Exam  ?  ?VS:  BP 116/74 (BP Location: Left Arm, Patient Position: Sitting, Cuff Size: Normal)   Pulse 62   Ht 5' 4.5" (1.638 m)   Wt 130 lb 9.6 oz (59.2 kg)   BMI 22.07 kg/m?  , BMI Body mass index is 22.07 kg/m?. ? ?Wt Readings from Last 3 Encounters:  ?01/10/22 130 lb 9.6 oz (59.2 kg)  ?12/12/21 130 lb 8 oz (59.2 kg)  ?10/26/21 130 lb 9.6 oz (59.2 kg)  ?  ?GEN: Well nourished, well developed, in no acute distress. ?HEENT: normal. ?Neck: Supple, no JVD, carotid bruits, or masses. ?Cardiac: RRR, no murmurs,  rubs, or gallops. No clubbing, cyanosis, edema.  Radials/PT 2+ and equal bilaterally.  ?Respiratory:  Respirations regular and unlabored, clear to auscultation bilaterally. ?GI: Soft, nontender, nondistended. ?MS

## 2022-01-11 DIAGNOSIS — J3089 Other allergic rhinitis: Secondary | ICD-10-CM | POA: Diagnosis not present

## 2022-01-11 LAB — BASIC METABOLIC PANEL
BUN/Creatinine Ratio: 19 (ref 12–28)
BUN: 19 mg/dL (ref 8–27)
CO2: 27 mmol/L (ref 20–29)
Calcium: 9.3 mg/dL (ref 8.7–10.3)
Chloride: 99 mmol/L (ref 96–106)
Creatinine, Ser: 0.99 mg/dL (ref 0.57–1.00)
Glucose: 103 mg/dL — ABNORMAL HIGH (ref 70–99)
Potassium: 3.8 mmol/L (ref 3.5–5.2)
Sodium: 144 mmol/L (ref 134–144)
eGFR: 61 mL/min/{1.73_m2} (ref >59)

## 2022-01-12 DIAGNOSIS — F331 Major depressive disorder, recurrent, moderate: Secondary | ICD-10-CM | POA: Diagnosis not present

## 2022-01-16 ENCOUNTER — Telehealth: Payer: Self-pay | Admitting: *Deleted

## 2022-01-16 NOTE — Telephone Encounter (Addendum)
Patient is calling with concerns about her right great toenail which she has just noticed after removing nail polish,has a hole just above the border of that nail, is a little painful. Should she schedule a sooner appointment(02/14/22)?Please advise. ?

## 2022-01-18 NOTE — Telephone Encounter (Signed)
She has a f/u appointment on 01/26/22 scheduled

## 2022-01-19 NOTE — Telephone Encounter (Signed)
Pt was transferred to me and I explained we do not have any openings for today and I did put her on the waitlist for any cancelations for a sooner appt. ?

## 2022-01-19 NOTE — Telephone Encounter (Signed)
Patient is requesting a sooner appointment,possible today.

## 2022-01-24 DIAGNOSIS — J301 Allergic rhinitis due to pollen: Secondary | ICD-10-CM | POA: Diagnosis not present

## 2022-01-24 DIAGNOSIS — J3089 Other allergic rhinitis: Secondary | ICD-10-CM | POA: Diagnosis not present

## 2022-01-24 DIAGNOSIS — E039 Hypothyroidism, unspecified: Secondary | ICD-10-CM | POA: Diagnosis not present

## 2022-01-24 DIAGNOSIS — R7301 Impaired fasting glucose: Secondary | ICD-10-CM | POA: Diagnosis not present

## 2022-01-24 DIAGNOSIS — J3081 Allergic rhinitis due to animal (cat) (dog) hair and dander: Secondary | ICD-10-CM | POA: Diagnosis not present

## 2022-01-26 ENCOUNTER — Ambulatory Visit: Payer: PPO | Admitting: Podiatry

## 2022-01-26 DIAGNOSIS — B351 Tinea unguium: Secondary | ICD-10-CM | POA: Diagnosis not present

## 2022-01-26 DIAGNOSIS — Z79899 Other long term (current) drug therapy: Secondary | ICD-10-CM | POA: Diagnosis not present

## 2022-01-26 NOTE — Patient Instructions (Addendum)
You can also start taking a "Biotin" supplement or a vitamin for "hair, skin, and nails" ? ?-- ? ?Terbinafine Tablets ?What is this medication? ?TERBINAFINE (TER bin a feen) treats fungal infections of the nails. It belongs to a group of medications called antifungals. It will not treat infections caused by bacteria or viruses. ?This medicine may be used for other purposes; ask your health care provider or pharmacist if you have questions. ?COMMON BRAND NAME(S): Lamisil, Terbinex ?What should I tell my care team before I take this medication? ?They need to know if you have any of these conditions: ?Liver disease ?An unusual or allergic reaction to terbinafine, other medications, foods, dyes, or preservatives ?Pregnant or trying to get pregnant ?Breast-feeding ?How should I use this medication? ?Take this medication by mouth with water. Take it as directed on the prescription label at the same time every day. You can take it with or without food. If it upsets your stomach, take it with food. Keep taking it unless your care team tells you to stop. ?A special MedGuide will be given to you by the pharmacist with each prescription and refill. Be sure to read this information carefully each time. ?Talk to your care team regarding the use of this medication in children. Special care may be needed. ?Overdosage: If you think you have taken too much of this medicine contact a poison control center or emergency room at once. ?NOTE: This medicine is only for you. Do not share this medicine with others. ?What if I miss a dose? ?If you miss a dose, take it as soon as you can unless it is more than 4 hours late. If it is more than 4 hours late, skip the missed dose. Take the next dose at the normal time. ?What may interact with this medication? ?Do not take this medication with any of the following: ?Pimozide ?Thioridazine ?This medication may also interact with the following: ?Beta blockers ?Caffeine ?Certain medications for  mental health conditions ?Cimetidine ?Cyclosporine ?Medications for fungal infections like fluconazole and ketoconazole ?Medications for irregular heartbeat like amiodarone, flecainide and propafenone ?Rifampin ?Warfarin ?This list may not describe all possible interactions. Give your health care provider a list of all the medicines, herbs, non-prescription drugs, or dietary supplements you use. Also tell them if you smoke, drink alcohol, or use illegal drugs. Some items may interact with your medicine. ?What should I watch for while using this medication? ?Visit your care team for regular checks on your progress. You may need blood work while you are taking this medication. It may be some time before you see the benefit from this medication. ?This medication may cause serious skin reactions. They can happen weeks to months after starting the medication. Contact your care team right away if you notice fevers or flu-like symptoms with a rash. The rash may be red or purple and then turn into blisters or peeling of the skin. Or, you might notice a red rash with swelling of the face, lips or lymph nodes in your neck or under your arms. ?This medication can make you more sensitive to the sun. Keep out of the sun, If you cannot avoid being in the sun, wear protective clothing and sunscreen. Do not use sun lamps or tanning beds/booths. ?What side effects may I notice from receiving this medication? ?Side effects that you should report to your care team as soon as possible: ?Allergic reactions--skin rash, itching, hives, swelling of the face, lips, tongue, or throat ?Change in sense of  smell ?Change in taste ?Infection--fever, chills, cough, or sore throat ?Liver injury--right upper belly pain, loss of appetite, nausea, light-colored stool, dark yellow or brown urine, yellowing skin or eyes, unusual weakness or fatigue ?Low red blood cell level--unusual weakness or fatigue, dizziness, headache, trouble breathing ?Lupus-like  syndrome--joint pain, swelling, or stiffness, butterfly-shaped rash on the face, rashes that get worse in the sun, fever, unusual weakness or fatigue ?Rash, fever, and swollen lymph nodes ?Redness, blistering, peeling, or loosening of the skin, including inside the mouth ?Unusual bruising or bleeding ?Worsening mood, feelings of depression ?Side effects that usually do not require medical attention (report to your care team if they continue or are bothersome): ?Diarrhea ?Gas ?Headache ?Nausea ?Stomach pain ?Upset stomach ?This list may not describe all possible side effects. Call your doctor for medical advice about side effects. You may report side effects to FDA at 1-800-FDA-1088. ?Where should I keep my medication? ?Keep out of the reach of children and pets. ?Store between 20 and 25 degrees C (68 and 77 degrees F). Protect from light. Get rid of any unused medication after the expiration date. ?To get rid of medications that are no longer needed or have expired: ?Take the medication to a medication take-back program. Check with your pharmacy or law enforcement to find a location. ?If you cannot return the medication, check the label or package insert to see if the medication should be thrown out in the garbage or flushed down the toilet. If you are not sure, ask your care team. If it is safe to put it in the trash, take the medication out of the container. Mix the medication with cat litter, dirt, coffee grounds, or other unwanted substance. Seal the mixture in a bag or container. Put it in the trash. ?NOTE: This sheet is a summary. It may not cover all possible information. If you have questions about this medicine, talk to your doctor, pharmacist, or health care provider. ?? 2022 Elsevier/Gold Standard (2021-05-18 00:00:00) ? ?

## 2022-01-27 DIAGNOSIS — Z79899 Other long term (current) drug therapy: Secondary | ICD-10-CM | POA: Diagnosis not present

## 2022-01-27 DIAGNOSIS — J3089 Other allergic rhinitis: Secondary | ICD-10-CM | POA: Diagnosis not present

## 2022-01-28 LAB — CBC WITH DIFFERENTIAL/PLATELET
Basophils Absolute: 0.1 10*3/uL (ref 0.0–0.2)
Basos: 2 %
EOS (ABSOLUTE): 0.1 10*3/uL (ref 0.0–0.4)
Eos: 2 %
Hematocrit: 46.9 % — ABNORMAL HIGH (ref 34.0–46.6)
Hemoglobin: 15.6 g/dL (ref 11.1–15.9)
Immature Grans (Abs): 0 10*3/uL (ref 0.0–0.1)
Immature Granulocytes: 0 %
Lymphocytes Absolute: 1.5 10*3/uL (ref 0.7–3.1)
Lymphs: 29 %
MCH: 32.2 pg (ref 26.6–33.0)
MCHC: 33.3 g/dL (ref 31.5–35.7)
MCV: 97 fL (ref 79–97)
Monocytes Absolute: 0.6 10*3/uL (ref 0.1–0.9)
Monocytes: 11 %
Neutrophils Absolute: 3 10*3/uL (ref 1.4–7.0)
Neutrophils: 56 %
Platelets: 196 10*3/uL (ref 150–450)
RBC: 4.84 x10E6/uL (ref 3.77–5.28)
RDW: 12.3 % (ref 11.7–15.4)
WBC: 5.3 10*3/uL (ref 3.4–10.8)

## 2022-01-28 LAB — HEPATIC FUNCTION PANEL
ALT: 20 IU/L (ref 0–32)
AST: 25 IU/L (ref 0–40)
Albumin: 4.8 g/dL — ABNORMAL HIGH (ref 3.7–4.7)
Alkaline Phosphatase: 80 IU/L (ref 44–121)
Bilirubin Total: 0.4 mg/dL (ref 0.0–1.2)
Bilirubin, Direct: 0.13 mg/dL (ref 0.00–0.40)
Total Protein: 6.6 g/dL (ref 6.0–8.5)

## 2022-01-29 DIAGNOSIS — B351 Tinea unguium: Secondary | ICD-10-CM | POA: Insufficient documentation

## 2022-01-29 NOTE — Progress Notes (Signed)
Subjective: ?71 year old female presents the office today with concerns of a hole in her right big toenail.  She states that there may have been more fungus in the nail as well.  No swelling or redness or any drainage. No recent treatment.  No injuries that she recalls.  No other concerns. ? ?Objective: ?AAO x3, NAD ?DP/PT pulses palpable bilaterally, CRT less than 3 seconds ?Right hallux toenail is loose with underlying nailbed distally the nail is hypertrophic, dystrophic with brown discoloration.  There is a "hole" on the central aspect but this corresponds to where the nail is loose from the nail bed distally.  There is no edema, erythema, drainage or pus or signs of infection. ?No pain with calf compression, swelling, warmth, erythema ? ?Assessment: ?Onychomycosis, onycholysis ? ?Plan: ?-All treatment options discussed with the patient including all alternatives, risks, complications.  ?-Was able to sharply debride the loose portion of the nail without any complications or bleeding.  This was debrided to the "hole" I was present.  The remainder the nail appears to be intact.  There is no edema or any signs of infection at this time.  Discussed treatment options for nail fungus.  Wants to do oral Lamisil.  We will check a CBC and LFT prior to starting the medication.  Monitor for any signs or symptoms of infection. ?-Patient encouraged to call the office with any questions, concerns, change in symptoms.  ? ?Trula Slade DPM ? ?

## 2022-01-31 ENCOUNTER — Other Ambulatory Visit: Payer: Self-pay | Admitting: Podiatry

## 2022-01-31 DIAGNOSIS — E039 Hypothyroidism, unspecified: Secondary | ICD-10-CM | POA: Diagnosis not present

## 2022-01-31 DIAGNOSIS — Z79899 Other long term (current) drug therapy: Secondary | ICD-10-CM

## 2022-01-31 DIAGNOSIS — E049 Nontoxic goiter, unspecified: Secondary | ICD-10-CM | POA: Diagnosis not present

## 2022-01-31 DIAGNOSIS — E7800 Pure hypercholesterolemia, unspecified: Secondary | ICD-10-CM | POA: Diagnosis not present

## 2022-01-31 DIAGNOSIS — R7301 Impaired fasting glucose: Secondary | ICD-10-CM | POA: Diagnosis not present

## 2022-01-31 DIAGNOSIS — I10 Essential (primary) hypertension: Secondary | ICD-10-CM | POA: Diagnosis not present

## 2022-01-31 MED ORDER — TERBINAFINE HCL 250 MG PO TABS: 250.0000 mg | ORAL_TABLET | Freq: Every day | ORAL | 0 refills | Status: DC

## 2022-02-01 DIAGNOSIS — J3489 Other specified disorders of nose and nasal sinuses: Secondary | ICD-10-CM | POA: Diagnosis not present

## 2022-02-01 DIAGNOSIS — J329 Chronic sinusitis, unspecified: Secondary | ICD-10-CM | POA: Diagnosis not present

## 2022-02-01 DIAGNOSIS — J341 Cyst and mucocele of nose and nasal sinus: Secondary | ICD-10-CM | POA: Diagnosis not present

## 2022-02-01 DIAGNOSIS — J342 Deviated nasal septum: Secondary | ICD-10-CM | POA: Diagnosis not present

## 2022-02-01 DIAGNOSIS — J309 Allergic rhinitis, unspecified: Secondary | ICD-10-CM | POA: Diagnosis not present

## 2022-02-03 DIAGNOSIS — J3089 Other allergic rhinitis: Secondary | ICD-10-CM | POA: Diagnosis not present

## 2022-02-14 ENCOUNTER — Ambulatory Visit: Payer: PPO | Admitting: Podiatry

## 2022-02-17 DIAGNOSIS — J3089 Other allergic rhinitis: Secondary | ICD-10-CM | POA: Diagnosis not present

## 2022-02-28 DIAGNOSIS — J3089 Other allergic rhinitis: Secondary | ICD-10-CM | POA: Diagnosis not present

## 2022-03-07 DIAGNOSIS — G4733 Obstructive sleep apnea (adult) (pediatric): Secondary | ICD-10-CM | POA: Diagnosis not present

## 2022-03-09 ENCOUNTER — Ambulatory Visit (INDEPENDENT_AMBULATORY_CARE_PROVIDER_SITE_OTHER): Payer: PPO | Admitting: Podiatry

## 2022-03-09 DIAGNOSIS — B351 Tinea unguium: Secondary | ICD-10-CM

## 2022-03-12 NOTE — Progress Notes (Signed)
Subjective: 71 year old female presents the office today for follow-up evaluation of her right big toenail dystrophy.  With the nail starting to loosen up.  She has no swelling redness or any drainage.  No open lesions.   Objective: AAO x3, NAD DP/PT pulses palpable bilaterally, CRT less than 3 seconds Right hallux nail is dystrophic with brown discoloration.  On the lateral border the nail is loose which is able to debride today.  The medial proximal nail is still intact.  There is no edema, erythema, drainage or pus or signs of infection. No pain with calf compression, swelling, warmth, erythema  Assessment: Onychomycosis, onychodystrophy  Plan: -All treatment options discussed with the patient including all alternatives, risks, complications.  -Discussed total nail removal but ultimately she wants to hold off on this.  I sharply debrided the loose nail with any complications bleeding.  Continue Lamisil. -Patient encouraged to call the office with any questions, concerns, change in symptoms.

## 2022-03-14 DIAGNOSIS — F900 Attention-deficit hyperactivity disorder, predominantly inattentive type: Secondary | ICD-10-CM | POA: Diagnosis not present

## 2022-03-14 DIAGNOSIS — F330 Major depressive disorder, recurrent, mild: Secondary | ICD-10-CM | POA: Diagnosis not present

## 2022-03-14 DIAGNOSIS — J301 Allergic rhinitis due to pollen: Secondary | ICD-10-CM | POA: Diagnosis not present

## 2022-03-14 DIAGNOSIS — J3081 Allergic rhinitis due to animal (cat) (dog) hair and dander: Secondary | ICD-10-CM | POA: Diagnosis not present

## 2022-03-14 DIAGNOSIS — J3089 Other allergic rhinitis: Secondary | ICD-10-CM | POA: Diagnosis not present

## 2022-03-14 DIAGNOSIS — F1010 Alcohol abuse, uncomplicated: Secondary | ICD-10-CM | POA: Diagnosis not present

## 2022-03-17 DIAGNOSIS — F1010 Alcohol abuse, uncomplicated: Secondary | ICD-10-CM | POA: Diagnosis not present

## 2022-03-17 DIAGNOSIS — F330 Major depressive disorder, recurrent, mild: Secondary | ICD-10-CM | POA: Diagnosis not present

## 2022-03-17 DIAGNOSIS — F900 Attention-deficit hyperactivity disorder, predominantly inattentive type: Secondary | ICD-10-CM | POA: Diagnosis not present

## 2022-03-21 DIAGNOSIS — J3081 Allergic rhinitis due to animal (cat) (dog) hair and dander: Secondary | ICD-10-CM | POA: Diagnosis not present

## 2022-03-21 DIAGNOSIS — J301 Allergic rhinitis due to pollen: Secondary | ICD-10-CM | POA: Diagnosis not present

## 2022-03-21 DIAGNOSIS — J3089 Other allergic rhinitis: Secondary | ICD-10-CM | POA: Diagnosis not present

## 2022-03-30 DIAGNOSIS — J3089 Other allergic rhinitis: Secondary | ICD-10-CM | POA: Diagnosis not present

## 2022-03-30 DIAGNOSIS — J301 Allergic rhinitis due to pollen: Secondary | ICD-10-CM | POA: Diagnosis not present

## 2022-03-30 DIAGNOSIS — J3081 Allergic rhinitis due to animal (cat) (dog) hair and dander: Secondary | ICD-10-CM | POA: Diagnosis not present

## 2022-04-12 DIAGNOSIS — J3081 Allergic rhinitis due to animal (cat) (dog) hair and dander: Secondary | ICD-10-CM | POA: Diagnosis not present

## 2022-04-12 DIAGNOSIS — J3089 Other allergic rhinitis: Secondary | ICD-10-CM | POA: Diagnosis not present

## 2022-04-12 DIAGNOSIS — J301 Allergic rhinitis due to pollen: Secondary | ICD-10-CM | POA: Diagnosis not present

## 2022-04-19 DIAGNOSIS — J3081 Allergic rhinitis due to animal (cat) (dog) hair and dander: Secondary | ICD-10-CM | POA: Diagnosis not present

## 2022-04-19 DIAGNOSIS — J3089 Other allergic rhinitis: Secondary | ICD-10-CM | POA: Diagnosis not present

## 2022-04-19 DIAGNOSIS — J301 Allergic rhinitis due to pollen: Secondary | ICD-10-CM | POA: Diagnosis not present

## 2022-04-24 DIAGNOSIS — N760 Acute vaginitis: Secondary | ICD-10-CM | POA: Diagnosis not present

## 2022-04-24 DIAGNOSIS — Z6821 Body mass index (BMI) 21.0-21.9, adult: Secondary | ICD-10-CM | POA: Diagnosis not present

## 2022-04-24 DIAGNOSIS — Z1231 Encounter for screening mammogram for malignant neoplasm of breast: Secondary | ICD-10-CM | POA: Diagnosis not present

## 2022-04-24 DIAGNOSIS — Z01419 Encounter for gynecological examination (general) (routine) without abnormal findings: Secondary | ICD-10-CM | POA: Diagnosis not present

## 2022-04-27 DIAGNOSIS — F330 Major depressive disorder, recurrent, mild: Secondary | ICD-10-CM | POA: Diagnosis not present

## 2022-04-27 DIAGNOSIS — F1010 Alcohol abuse, uncomplicated: Secondary | ICD-10-CM | POA: Diagnosis not present

## 2022-04-27 IMAGING — CT CT HEAD W/O CM
3 series · 16 of 47 positions shown, 19 images · non-contrast
Comparison: 01/12/2010

CLINICAL DATA: Loss of consciousness

EXAM:
CT HEAD WITHOUT CONTRAST
TECHNIQUE: Contiguous axial images were obtained from the base of the skull
through the vertex without intravenous contrast.

[Series 3: head 5.0 h30s · axial · 0.45mm/px · z∈[+1323,+1473]mm · 10 of 36 slices shown, 13 images]
[im 3/36  brain]
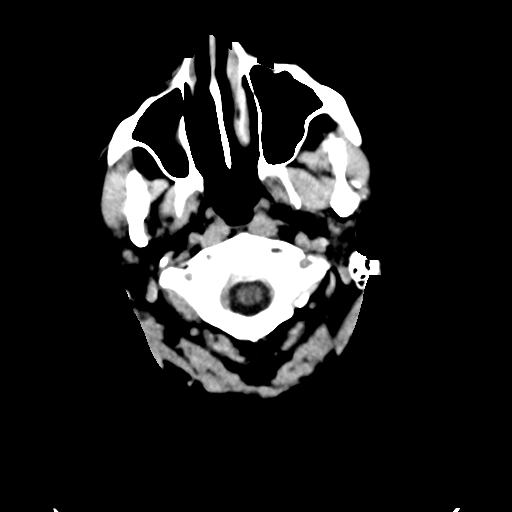
[im 3/36  bone]
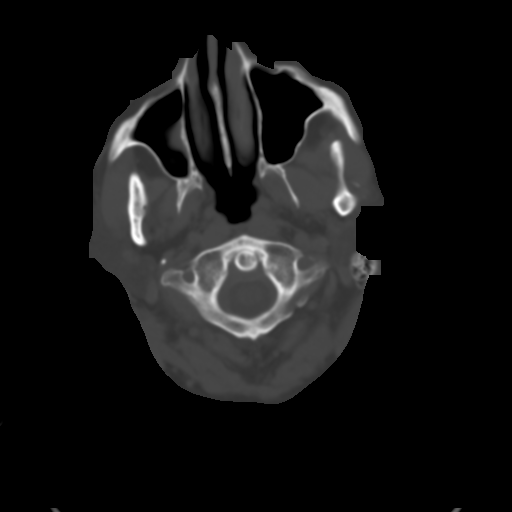
[im 7/36  brain]
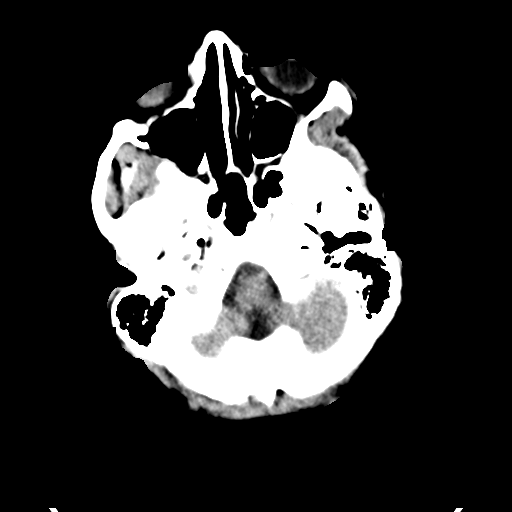
[im 10/36  brain]
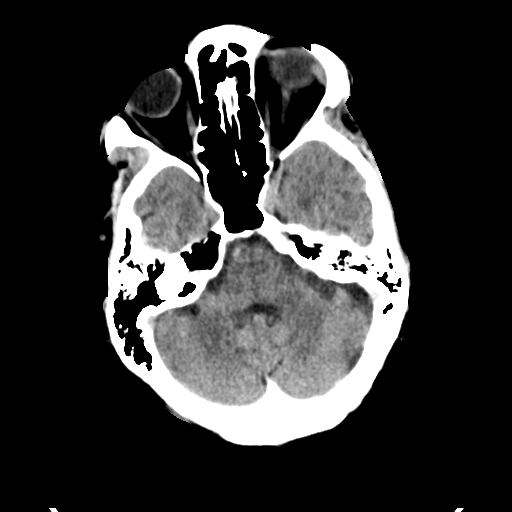
[im 13/36  brain]
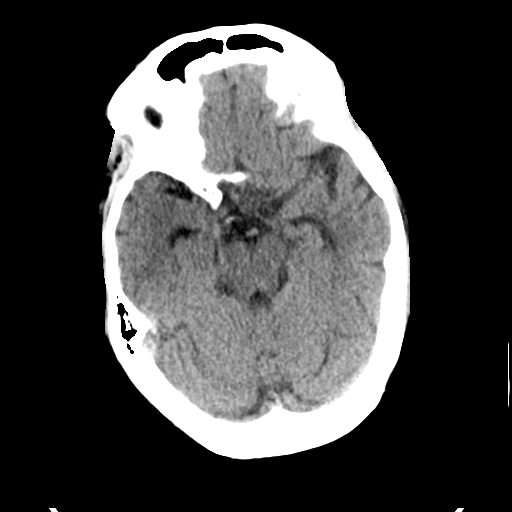
[im 16/36  brain]
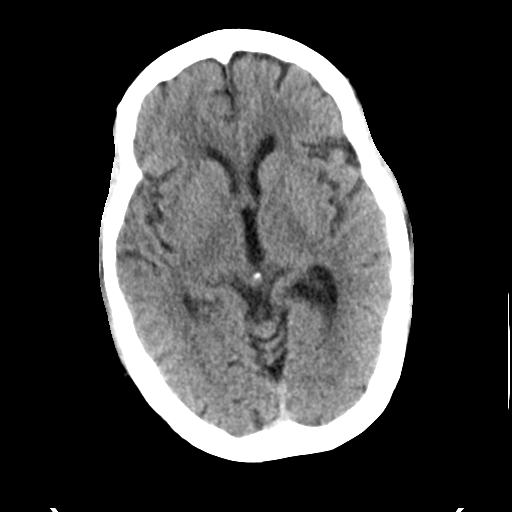
[im 16/36  bone]
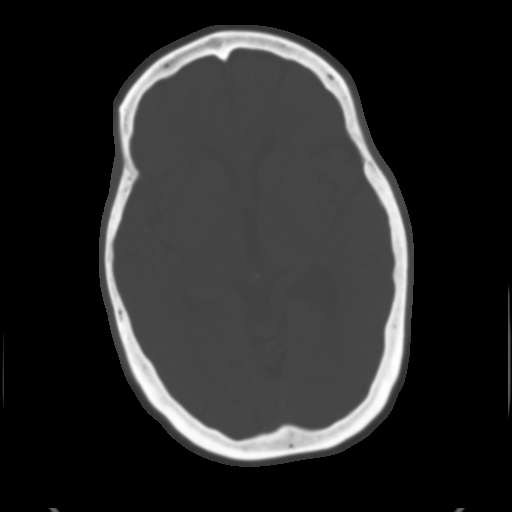
[im 20/36  brain]
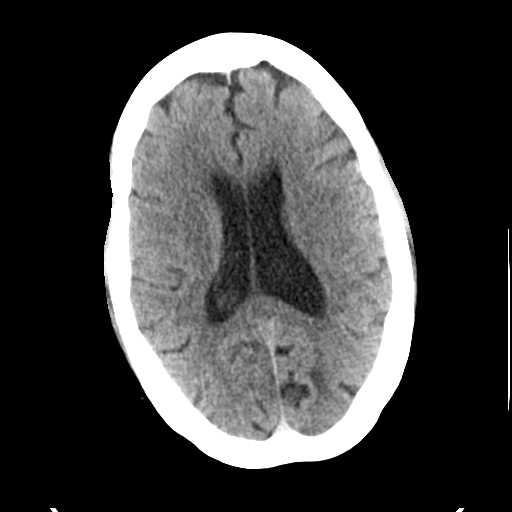
[im 23/36  brain]
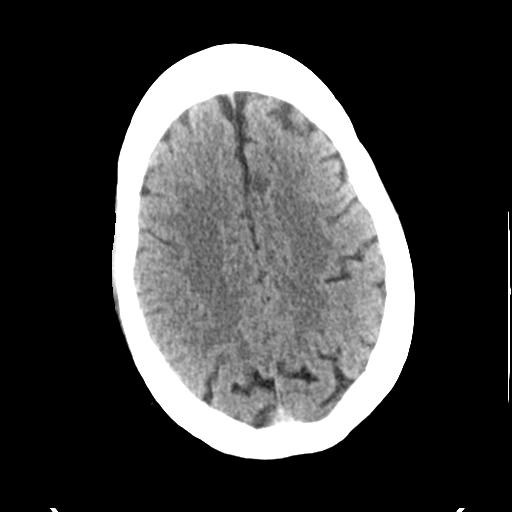
[im 27/36  brain]
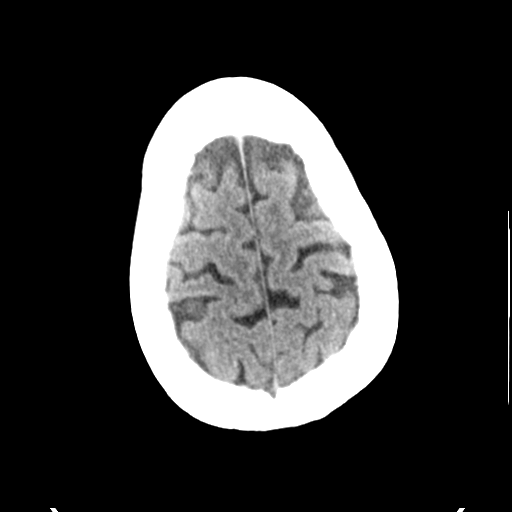
[im 29/36  brain]
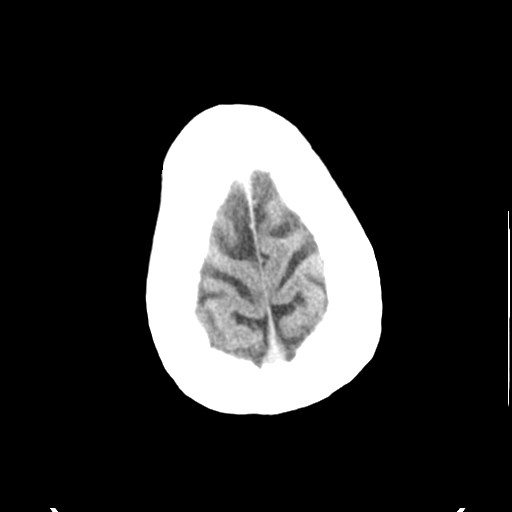
[im 29/36  bone]
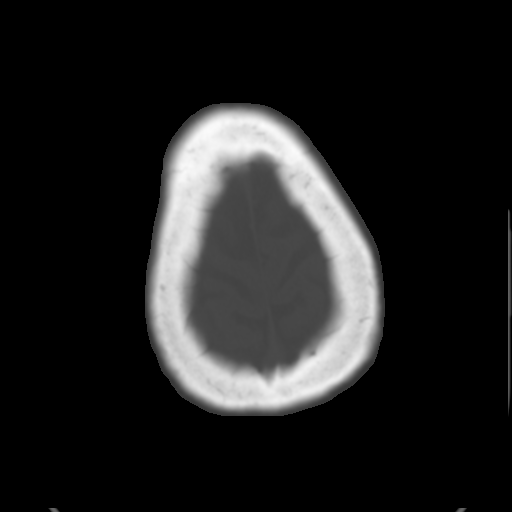
[im 33/36  brain]
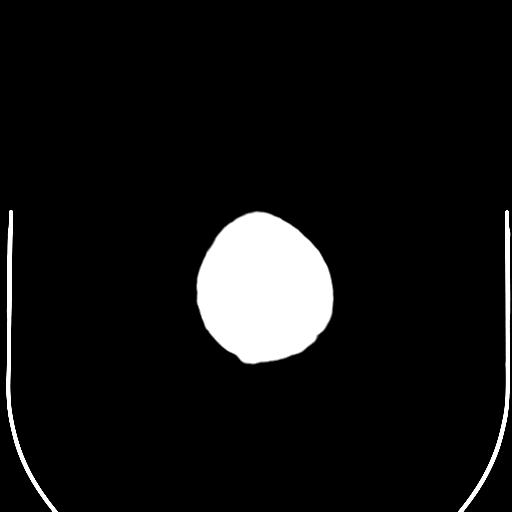

[Series 5: head 3.0 mpr cor · coronal · 0.38mm/px · 3 of 73 slices shown]
[im 25/73  brain]
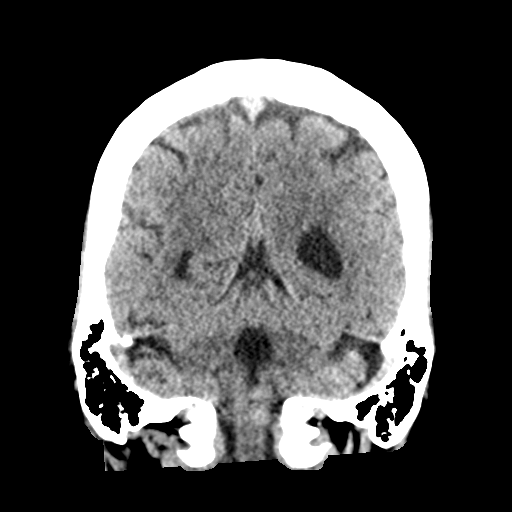
[im 33/73  brain]
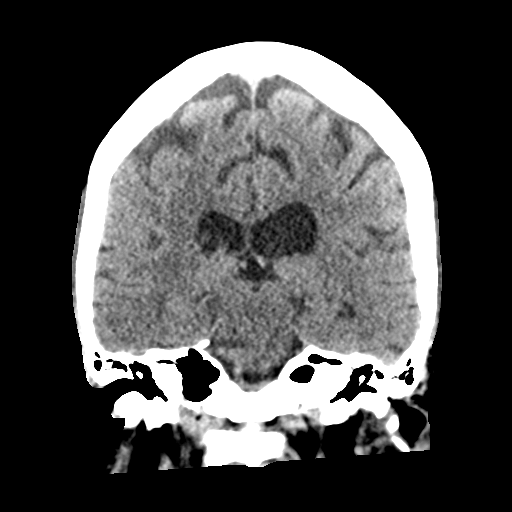
[im 41/73  brain]
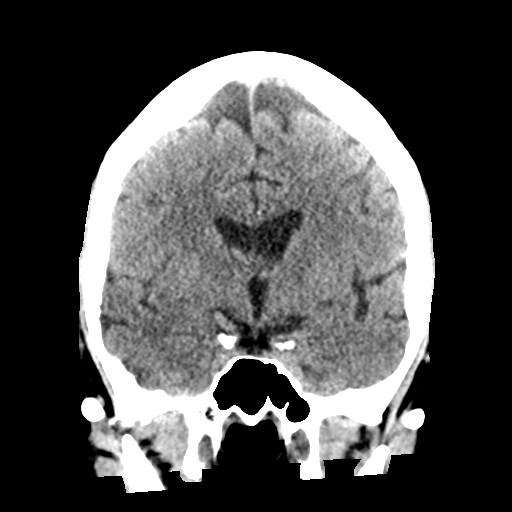

[Series 6: head 3.0 mpr sag · sagittal · 0.34mm/px · 3 of 67 slices shown]
[im 23/67  brain]
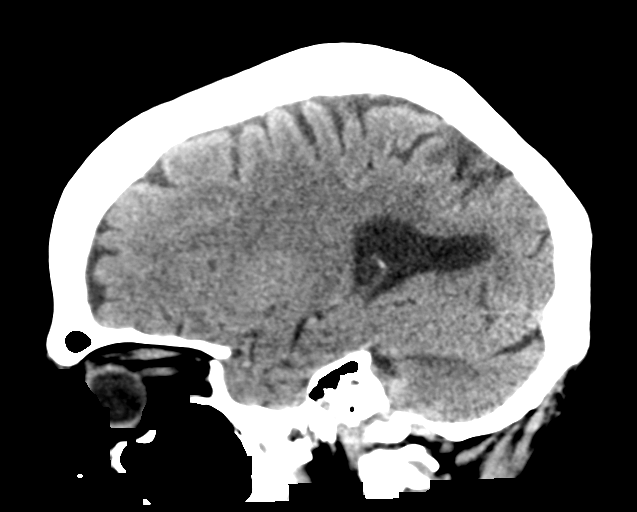
[im 34/67  brain]
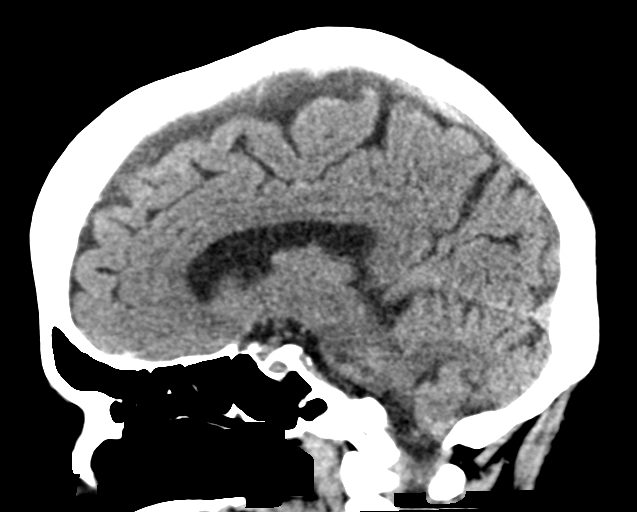
[im 44/67  brain]
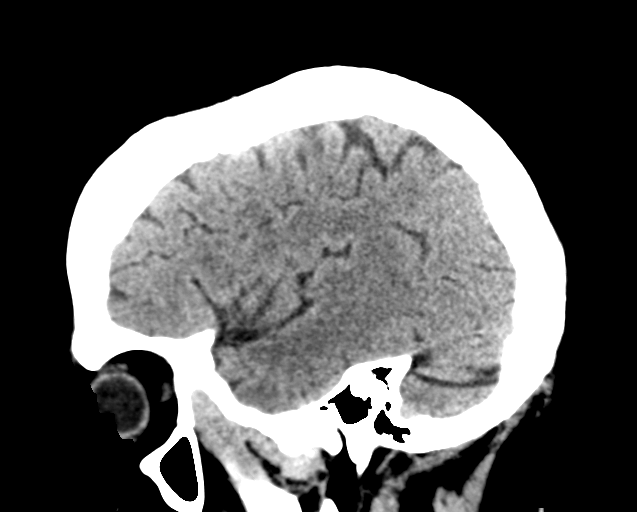

[16 of 47 positions shown; findings below may reference images not displayed]

FINDINGS: Brain: No evidence of acute infarction, hemorrhage, hydrocephalus,
extra-axial collection or mass lesion/mass effect. Mild
periventricular white matter hypodensity.

Vascular: No hyperdense vessel or unexpected calcification.

Skull: Normal. Negative for fracture or focal lesion.

Sinuses/Orbits: No acute finding.

Other: None.
IMPRESSION: No acute intracranial pathology. Small-vessel white matter disease.

## 2022-05-02 DIAGNOSIS — J3089 Other allergic rhinitis: Secondary | ICD-10-CM | POA: Diagnosis not present

## 2022-05-02 DIAGNOSIS — R062 Wheezing: Secondary | ICD-10-CM | POA: Diagnosis not present

## 2022-05-02 DIAGNOSIS — J301 Allergic rhinitis due to pollen: Secondary | ICD-10-CM | POA: Diagnosis not present

## 2022-05-02 DIAGNOSIS — J3081 Allergic rhinitis due to animal (cat) (dog) hair and dander: Secondary | ICD-10-CM | POA: Diagnosis not present

## 2022-05-03 DIAGNOSIS — F330 Major depressive disorder, recurrent, mild: Secondary | ICD-10-CM | POA: Diagnosis not present

## 2022-05-03 DIAGNOSIS — F1010 Alcohol abuse, uncomplicated: Secondary | ICD-10-CM | POA: Diagnosis not present

## 2022-05-10 DIAGNOSIS — F330 Major depressive disorder, recurrent, mild: Secondary | ICD-10-CM | POA: Diagnosis not present

## 2022-05-10 DIAGNOSIS — F1010 Alcohol abuse, uncomplicated: Secondary | ICD-10-CM | POA: Diagnosis not present

## 2022-05-11 ENCOUNTER — Ambulatory Visit: Payer: PPO | Admitting: Podiatry

## 2022-05-11 DIAGNOSIS — B351 Tinea unguium: Secondary | ICD-10-CM | POA: Diagnosis not present

## 2022-05-11 DIAGNOSIS — M79675 Pain in left toe(s): Secondary | ICD-10-CM | POA: Diagnosis not present

## 2022-05-11 DIAGNOSIS — M79674 Pain in right toe(s): Secondary | ICD-10-CM

## 2022-05-11 DIAGNOSIS — Z79899 Other long term (current) drug therapy: Secondary | ICD-10-CM | POA: Diagnosis not present

## 2022-05-12 ENCOUNTER — Telehealth (HOSPITAL_BASED_OUTPATIENT_CLINIC_OR_DEPARTMENT_OTHER): Payer: Self-pay | Admitting: Cardiovascular Disease

## 2022-05-12 DIAGNOSIS — J301 Allergic rhinitis due to pollen: Secondary | ICD-10-CM | POA: Diagnosis not present

## 2022-05-12 DIAGNOSIS — J3089 Other allergic rhinitis: Secondary | ICD-10-CM | POA: Diagnosis not present

## 2022-05-12 DIAGNOSIS — Z79899 Other long term (current) drug therapy: Secondary | ICD-10-CM | POA: Diagnosis not present

## 2022-05-12 DIAGNOSIS — J3081 Allergic rhinitis due to animal (cat) (dog) hair and dander: Secondary | ICD-10-CM | POA: Diagnosis not present

## 2022-05-12 NOTE — Telephone Encounter (Signed)
Called patient on 05/12/2022 to reschedule appointment for 05/16/2022 and left a v/m.

## 2022-05-13 LAB — CBC WITH DIFFERENTIAL/PLATELET
Absolute Monocytes: 555 cells/uL (ref 200–950)
Basophils Absolute: 59 cells/uL (ref 0–200)
Basophils Relative: 1 %
Eosinophils Absolute: 71 cells/uL (ref 15–500)
Eosinophils Relative: 1.2 %
HCT: 42.8 % (ref 35.0–45.0)
Hemoglobin: 14.4 g/dL (ref 11.7–15.5)
Lymphs Abs: 1652 cells/uL (ref 850–3900)
MCH: 33.3 pg — ABNORMAL HIGH (ref 27.0–33.0)
MCHC: 33.6 g/dL (ref 32.0–36.0)
MCV: 98.8 fL (ref 80.0–100.0)
MPV: 10 fL (ref 7.5–12.5)
Monocytes Relative: 9.4 %
Neutro Abs: 3564 cells/uL (ref 1500–7800)
Neutrophils Relative %: 60.4 %
Platelets: 233 10*3/uL (ref 140–400)
RBC: 4.33 10*6/uL (ref 3.80–5.10)
RDW: 12 % (ref 11.0–15.0)
Total Lymphocyte: 28 %
WBC: 5.9 10*3/uL (ref 3.8–10.8)

## 2022-05-13 LAB — HEPATIC FUNCTION PANEL
AG Ratio: 2.8 (calc) — ABNORMAL HIGH (ref 1.0–2.5)
ALT: 21 U/L (ref 6–29)
AST: 22 U/L (ref 10–35)
Albumin: 4.8 g/dL (ref 3.6–5.1)
Alkaline phosphatase (APISO): 63 U/L (ref 37–153)
Bilirubin, Direct: 0.1 mg/dL (ref 0.0–0.2)
Globulin: 1.7 g/dL (calc) — ABNORMAL LOW (ref 1.9–3.7)
Indirect Bilirubin: 0.3 mg/dL (calc) (ref 0.2–1.2)
Total Bilirubin: 0.4 mg/dL (ref 0.2–1.2)
Total Protein: 6.5 g/dL (ref 6.1–8.1)

## 2022-05-16 ENCOUNTER — Ambulatory Visit (HOSPITAL_BASED_OUTPATIENT_CLINIC_OR_DEPARTMENT_OTHER): Payer: PPO | Admitting: Family

## 2022-05-16 ENCOUNTER — Ambulatory Visit (HOSPITAL_BASED_OUTPATIENT_CLINIC_OR_DEPARTMENT_OTHER): Payer: PPO | Admitting: Cardiovascular Disease

## 2022-05-16 ENCOUNTER — Encounter (HOSPITAL_BASED_OUTPATIENT_CLINIC_OR_DEPARTMENT_OTHER): Payer: Self-pay | Admitting: Family

## 2022-05-16 VITALS — BP 105/67 | HR 78 | Ht 64.5 in | Wt 125.0 lb

## 2022-05-16 DIAGNOSIS — I10 Essential (primary) hypertension: Secondary | ICD-10-CM | POA: Diagnosis not present

## 2022-05-16 DIAGNOSIS — R002 Palpitations: Secondary | ICD-10-CM

## 2022-05-16 DIAGNOSIS — E782 Mixed hyperlipidemia: Secondary | ICD-10-CM

## 2022-05-16 MED ORDER — CHLORTHALIDONE 25 MG PO TABS: 12.5000 mg | ORAL_TABLET | Freq: Every day | ORAL | 3 refills | Status: DC

## 2022-05-16 NOTE — Progress Notes (Signed)
Office Visit    Patient Name: Brianna Gates Date of Encounter: 05/16/2022  PCP:  Prince Solian, MD   [facility]  Cardiologist:  Skeet Latch, MD  Advanced Practice Provider:  No care team member to display Electrophysiologist:  None      Chief Complaint    Brianna Gates is a 71 y.o. female with a hx of hypertension, hyperlipidemia, alcohol abuse, hypothyroidism presents today for hypertension follow-up  Past Medical History    Past Medical History:  Diagnosis Date   Allergy    Anxiety    Asthma    INFREQUENT PROBLEM - rarely used inhaler   Cervical spondylosis    Cervical strain 07/22/2014   Chest pain of uncertain etiology 02/18/3975   Complication of anesthesia    UNKNOWN REACTION WITH RASH AT SITE OF IV only with knee surgery   Degenerative arthritis    Dyslipidemia    Eczema    GERD (gastroesophageal reflux disease)    Guillain-Barre syndrome ([place])    History of Guillain-Barre syndrome 07/22/2014   Hyperlipidemia    Hypertension    Hypothyroidism    Lumbosacral spondylosis    Palpitations 12/12/2021   Pure hypercholesterolemia 12/13/2021   Raynaud disease    Thyroid nodule    Past Surgical History:  Procedure Laterality Date   ABDOMINAL HYSTERECTOMY     COLONOSCOPY     ESOPHAGOGASTRODUODENOSCOPY (EGD) WITH PROPOFOL N/A 09/13/2021   Procedure: ESOPHAGOGASTRODUODENOSCOPY (EGD) WITH PROPOFOL;  Surgeon: Irene Shipper, MD;  Location: [facility];  Service: Endoscopy;  Laterality: N/A;   FOOT SURGERY Left    GANGLION CYST EXCISION Left    Wrist   HERNIA REPAIR     KNEE ARTHROSCOPY     RT KNEE   KNEE ARTHROTOMY Right 08/27/2013   Procedure: RIGHT KNEE SCAR EXCISION AND FEMORAL REVISION;  Surgeon: Gearlean Alf, MD;  Location: [facility];  Service: Orthopedics;  Laterality: Right;   REPLACEMENT TOTAL KNEE Right    2014, 2021   REPLACEMENT TOTAL KNEE BILATERAL Bilateral    STRABISMUS SURGERY Right    TONSILLECTOMY      vaginal vulvo prolapse  09/16/2015    Allergies  Allergies  Allergen Reactions   Buspar [Buspirone] Itching    Vivid dreams, diarrhea    Captopril Other (See Comments)    reaction   Oxaprozin Hives and Other (See Comments)   Ramipril Cough and Other (See Comments)   Zoloft [Sertraline Hcl] Diarrhea   Ace Inhibitors     Other reaction(s): Other (See Comments) GB syndrome   Doxycycline Other (See Comments)    Burning sensation/Rash   Influenza Vaccines     HX    Sulfa Antibiotics Other (See Comments)    unknown    History of Present Illness    Brianna Gates is a 71 y.o. female with a hx of hypertension, hyperlipidemia, alcohol abuse, hypothyroidism last seen 01/10/22  She was seen by primary care January 2023 and noted labile blood pressures.  She was on nebivolol and losartan at that time.  Her losartan has been increased and her blood pressure remains labile.  She was seen 12/12/2021.  Her nebivolol was transitioned to chlorthalidone and losartan continued at 50 mg twice daily.  She was recommended for 3-day monitor due to palpitations which revealed predominantly NSR with <1% PVC/PAC burden and one short episode of SVT which was triggered. Reduciton of caffeine intake and increased hydration were encouraged. Her BP was at goal  at follow up 01/10/22.  She presents today for follow up . Notes occasional episodes of lightheadedness and associated with some nausea. More often she has 2 cups of coffee and then gets up moving. She has been working to drink a bit more water but often drinking 32 oz of water per day.  No chest pain, dyspnea, orthopnea, PND.  Notes occasional lower extremity edema that resolves with elevation.  Prior antihypertensives: Amlodipine-palpitations  ACE-cough Nebivolol-ineffective  EKGs/Labs/Other Studies Reviewed:   The following studies were reviewed today:  11/2021 Monitor report 3 Day Zio Monitor   Quality: Fair.  Baseline  artifact. Predominant rhythm: sinus bradycardia Average heart rate: 58 bpm Max heart rate: 89 bpm Min heart rate: 44 bpm Pauses >2.5 seconds: none   Up to 6 beats SVT Rare PACs  Rare PVCs   EKG:  No EKG today  Recent Labs: 01/10/2022: BUN 19; Creatinine, Ser 0.99; Potassium 3.8; Sodium 144 05/12/2022: ALT 21; Hemoglobin 14.4; Platelets 233  Recent Lipid Panel No results found for: "CHOL", "TRIG", "HDL", "CHOLHDL", "VLDL", "LDLCALC", "LDLDIRECT"   Home Medications   Current Meds  Medication Sig   acetaminophen (TYLENOL) 650 MG CR tablet Take 1,300 mg by mouth every 8 (eight) hours as needed for pain or fever.   albuterol (PROVENTIL HFA;VENTOLIN HFA) 108 (90 BASE) MCG/ACT inhaler Inhale 2 puffs into the lungs every 6 (six) hours as needed for wheezing or shortness of breath.   amoxicillin (AMOXIL) 500 MG capsule Take 2,000 mg by mouth once as needed (prior to dental appointments).   Ascorbic Acid (VITAMIN C PO) Take 1 tablet by mouth daily as needed (sick symptoms).   atomoxetine (STRATTERA) 40 MG capsule Take 40 mg by mouth every morning.   azelastine (ASTELIN) 0.1 % nasal spray Place 1 spray into both nostrils 2 (two) times daily as needed (cold symptoms).   chlorthalidone (HYGROTON) 25 MG tablet Take 1 tablet (25 mg total) by mouth daily.   Cholecalciferol (VITAMIN D) 2000 UNITS CAPS Take 2,000 Units by mouth daily in the afternoon.   DULoxetine (CYMBALTA) 60 MG capsule Take 120 mg by mouth daily.   Emollient (CETAPHIL) cream Apply 1 application topically daily.   EPINEPHrine 0.3 mg/0.3 mL IJ SOAJ injection Inject 0.3 mg into the muscle once as needed for anaphylaxis.   guaiFENesin (MUCINEX) 600 MG 12 hr tablet Take 1,200 mg by mouth daily as needed.   ibuprofen (ADVIL,MOTRIN) 100 MG tablet Take 400 mg by mouth daily at 12 noon. May take in the pm additionally as needed   ketoconazole (NIZORAL) 2 % cream Apply 1 application topically daily. (Patient taking differently: Apply 1  application  topically daily as needed.)   levothyroxine (SYNTHROID) 50 MCG tablet Take 50 mcg by mouth daily before breakfast.    loratadine (CLARITIN) 10 MG tablet Take 10 mg by mouth daily.   losartan (COZAAR) 50 MG tablet Take 1 tablet (50 mg total) by mouth 2 (two) times daily.   montelukast (SINGULAIR) 10 MG tablet Take 10 mg by mouth at bedtime.   naltrexone (DEPADE) 50 MG tablet TAKE ONE TABLET IN THE MORNING   pantoprazole (PROTONIX) 40 MG tablet Take 40 mg by mouth daily.   Propylene Glycol-Glycerin (SOOTHE) 0.6-0.6 % SOLN Place 1 drop into both eyes daily.   rosuvastatin (CRESTOR) 5 MG tablet Take 1 tablet (5 mg total) by mouth every other day.   Simethicone (PHAZYME PO) Take 250 mg by mouth daily as needed (Gas).   triamcinolone cream (KENALOG) 0.1 %  1 application as needed (itching bumps on stomach). Mixed with Cetaphil     Review of Systems      All other systems reviewed and are otherwise negative except as noted above.  Physical Exam    VS:  BP 105/67 (BP Location: Left Arm, Patient Position: Sitting, Cuff Size: Normal)   Pulse 78   Ht 5' 4.5" (1.638 m)   Wt 125 lb (56.7 kg)   SpO2 98%   BMI 21.12 kg/m  , BMI Body mass index is 21.12 kg/m.  Wt Readings from Last 3 Encounters:  05/16/22 125 lb (56.7 kg)  01/10/22 130 lb 9.6 oz (59.2 kg)  12/12/21 130 lb 8 oz (59.2 kg)    GEN: Well nourished, well developed, in no acute distress. HEENT: normal. Neck: Supple, no JVD, carotid bruits, or masses. Cardiac: RRR, no murmurs, rubs, or gallops. No clubbing, cyanosis, edema.  Radials/PT 2+ and equal bilaterally.  Respiratory:  Respirations regular and unlabored, clear to auscultation bilaterally. GI: Soft, nontender, nondistended. MS: No deformity or atrophy. Skin: Warm and dry, no rash. Neuro:  Strength and sensation are intact. Psych: Normal affect.  Assessment & Plan    Palpitations - Likely related to anxiety.  Prior monitor with probably an NSR with 1 short run  of SVT.  Palpitations have been overall quiescent.  No indication for AV nodal blocking therapy.  Recommend stable hydrated, avoid caffeine, manage stress well.    HTN -hypotension in clinic today.  She is symptomatic with lightheadedness at home we will continue losartan 50 mg twice daily and reduce her chlorthalidone to 12.5 mg daily. Education provided on orthostatic precautions: Stable hydrated, eat regular meals, wear compression socks, with position changes slowly. Discussed to monitor BP at home at least 2 hours after medications and sitting for 5-10 minutes. BP goal <130/80.  HLD - Continue Rosuvastatin '5mg'$  three times per week.  Discussed LDL goal of <100. If >100 at her upcoming labs with primary care could consider increasing Rosuvastatin to QD.   Anxiety - Follows with counseling services and psychiatry.  Disposition: Follow up in 3-4 months with Skeet Latch, MD or APP.  Signed, Loel Dubonnet, NP 05/16/2022, 11:04 AM [place]

## 2022-05-16 NOTE — Patient Instructions (Addendum)
Medication Instructions:  Your physician has recommended you make the following change in your medication:   Change: Chlorthalidone to half tablet daily (12.'5mg'$ )   *If you need a refill on your cardiac medications before your next appointment, please call your pharmacy*  Follow-Up: At [facility], you and your health needs are our priority.  As part of our continuing mission to provide you with exceptional heart care, we have created designated Provider Care Teams.  These Care Teams include your primary Cardiologist (physician) and Advanced Practice Providers (APPs -  Physician Assistants and Nurse Practitioners) who all work together to provide you with the care you need, when you need it.  We recommend signing up for the patient portal called "MyChart".  Sign up information is provided on this After Visit Summary.  MyChart is used to connect with patients for Virtual Visits (Telemedicine).  Patients are able to view lab/test results, encounter notes, upcoming appointments, etc.  Non-urgent messages can be sent to your provider as well.   To learn more about what you can do with MyChart, go to [email or web address].    Your next appointment:   3-4 month(s)  The format for your next appointment:   In Person  Provider:   Skeet Latch, MD or Laurann Montana, NP{  Other Instructions  To prevent or reduce lower extremity swelling: Eat a low salt diet. Salt makes the body hold onto extra fluid which causes swelling. Sit with legs elevated. For example, in the recliner or on an [place].  Wear knee-high compression stockings during the daytime. Ones labeled 15-20 mmHg provide good compression.   Heart Healthy Diet Recommendations: A low-salt diet is recommended. Meats should be grilled, baked, or boiled. Avoid fried foods. Focus on lean protein sources like fish or chicken with vegetables and fruits. The American Heart Association is a Microbiologist!  American Heart Association Diet  and Lifeystyle Recommendations   Exercise recommendations: The American Heart Association recommends 150 minutes of moderate intensity exercise weekly. Try 30 minutes of moderate intensity exercise 4-5 times per week. This could include walking, jogging, or swimming.

## 2022-05-17 NOTE — Progress Notes (Signed)
Subjective: 71 year old female presents the office today for follow-up evaluation of her right big toenail dystrophy.  Lamisil without any side effects.  Appears that the nail is growing and how the old nail is still loose and thickened.  No significant pain in the nails there is no swelling redness or drainage.  No lesions.   Objective: AAO x3, NAD DP/PT pulses palpable bilaterally, CRT less than 3 seconds Right hallux nail is dystrophic with brown discoloration.  There is clearing of the right proximal nail fold and a more normal color and thickening.  Mild activation of left hallux toenail.  There is no edema, erythema, drainage approximately signs of infection to the toenail sites. No pain with calf compression, swelling, warmth, erythema  Assessment: Onychomycosis, onychodystrophy-on Lamisil  Plan: -All treatment options discussed with the patient including all alternatives, risks, complications.  -Debrided bilateral hallux nails with complications of bleeding.  Appears that the Lamisil is helping new nails coming in.  Would recheck a CBC and LFT.  Also discussed topical medication that she can use. -Patient encouraged to call the office with any questions, concerns, change in symptoms.

## 2022-05-22 DIAGNOSIS — E039 Hypothyroidism, unspecified: Secondary | ICD-10-CM | POA: Diagnosis not present

## 2022-05-22 DIAGNOSIS — R29890 Loss of height: Secondary | ICD-10-CM | POA: Diagnosis not present

## 2022-05-22 DIAGNOSIS — M8588 Other specified disorders of bone density and structure, other site: Secondary | ICD-10-CM | POA: Diagnosis not present

## 2022-05-22 DIAGNOSIS — N958 Other specified menopausal and perimenopausal disorders: Secondary | ICD-10-CM | POA: Diagnosis not present

## 2022-05-23 ENCOUNTER — Encounter (HOSPITAL_BASED_OUTPATIENT_CLINIC_OR_DEPARTMENT_OTHER): Payer: Self-pay

## 2022-05-26 DIAGNOSIS — J3089 Other allergic rhinitis: Secondary | ICD-10-CM | POA: Diagnosis not present

## 2022-06-07 DIAGNOSIS — J301 Allergic rhinitis due to pollen: Secondary | ICD-10-CM | POA: Diagnosis not present

## 2022-06-07 DIAGNOSIS — J3089 Other allergic rhinitis: Secondary | ICD-10-CM | POA: Diagnosis not present

## 2022-06-07 DIAGNOSIS — F330 Major depressive disorder, recurrent, mild: Secondary | ICD-10-CM | POA: Diagnosis not present

## 2022-06-07 DIAGNOSIS — F1010 Alcohol abuse, uncomplicated: Secondary | ICD-10-CM | POA: Diagnosis not present

## 2022-06-07 DIAGNOSIS — J3081 Allergic rhinitis due to animal (cat) (dog) hair and dander: Secondary | ICD-10-CM | POA: Diagnosis not present

## 2022-06-20 DIAGNOSIS — J3089 Other allergic rhinitis: Secondary | ICD-10-CM | POA: Diagnosis not present

## 2022-06-20 DIAGNOSIS — J3081 Allergic rhinitis due to animal (cat) (dog) hair and dander: Secondary | ICD-10-CM | POA: Diagnosis not present

## 2022-06-20 DIAGNOSIS — J301 Allergic rhinitis due to pollen: Secondary | ICD-10-CM | POA: Diagnosis not present

## 2022-07-03 DIAGNOSIS — F900 Attention-deficit hyperactivity disorder, predominantly inattentive type: Secondary | ICD-10-CM | POA: Diagnosis not present

## 2022-07-03 DIAGNOSIS — F1010 Alcohol abuse, uncomplicated: Secondary | ICD-10-CM | POA: Diagnosis not present

## 2022-07-03 DIAGNOSIS — F330 Major depressive disorder, recurrent, mild: Secondary | ICD-10-CM | POA: Diagnosis not present

## 2022-07-06 DIAGNOSIS — J301 Allergic rhinitis due to pollen: Secondary | ICD-10-CM | POA: Diagnosis not present

## 2022-07-06 DIAGNOSIS — J3081 Allergic rhinitis due to animal (cat) (dog) hair and dander: Secondary | ICD-10-CM | POA: Diagnosis not present

## 2022-07-06 DIAGNOSIS — J3089 Other allergic rhinitis: Secondary | ICD-10-CM | POA: Diagnosis not present

## 2022-07-13 ENCOUNTER — Ambulatory Visit: Payer: PPO | Admitting: Podiatry

## 2022-07-13 DIAGNOSIS — M79674 Pain in right toe(s): Secondary | ICD-10-CM

## 2022-07-13 DIAGNOSIS — B351 Tinea unguium: Secondary | ICD-10-CM | POA: Diagnosis not present

## 2022-07-13 DIAGNOSIS — M79675 Pain in left toe(s): Secondary | ICD-10-CM | POA: Diagnosis not present

## 2022-07-15 NOTE — Progress Notes (Signed)
Subjective: Chief Complaint  Patient presents with   Nail Problem    RFC    71 year old female presents the office today for follow-up evaluation of her right big toenail dystrophy.  She states that nail on the right side mostly is growing out better.  No swelling redness or drainage.  Objective: AAO x3, NAD DP/PT pulses palpable bilaterally, CRT less than 3 seconds Right hallux nail is dystrophic with brown discoloration however appears to be growing and more clear in color doing much better.  Mild activation of left hallux toenail.  There is no edema, erythema, drainage approximately signs of infection to the toenail sites. No pain with calf compression, swelling, warmth, erythema  Assessment: Onychomycosis, onychodystrophy-on Lamisil  Plan: -All treatment options discussed with the patient including all alternatives, risks, complications.  -Debrided bilateral hallux nails with complications of bleeding.  Lamisil has been beneficial for her. -Patient encouraged to call the office with any questions, concerns, change in symptoms.   Trula Slade DPM

## 2022-07-18 DIAGNOSIS — J3081 Allergic rhinitis due to animal (cat) (dog) hair and dander: Secondary | ICD-10-CM | POA: Diagnosis not present

## 2022-07-18 DIAGNOSIS — F419 Anxiety disorder, unspecified: Secondary | ICD-10-CM | POA: Diagnosis not present

## 2022-07-18 DIAGNOSIS — J3089 Other allergic rhinitis: Secondary | ICD-10-CM | POA: Diagnosis not present

## 2022-07-18 DIAGNOSIS — E039 Hypothyroidism, unspecified: Secondary | ICD-10-CM | POA: Diagnosis not present

## 2022-07-18 DIAGNOSIS — E785 Hyperlipidemia, unspecified: Secondary | ICD-10-CM | POA: Diagnosis not present

## 2022-07-18 DIAGNOSIS — J301 Allergic rhinitis due to pollen: Secondary | ICD-10-CM | POA: Diagnosis not present

## 2022-07-18 DIAGNOSIS — R5383 Other fatigue: Secondary | ICD-10-CM | POA: Diagnosis not present

## 2022-07-18 DIAGNOSIS — R7989 Other specified abnormal findings of blood chemistry: Secondary | ICD-10-CM | POA: Diagnosis not present

## 2022-07-18 DIAGNOSIS — I10 Essential (primary) hypertension: Secondary | ICD-10-CM | POA: Diagnosis not present

## 2022-07-25 ENCOUNTER — Other Ambulatory Visit: Payer: Self-pay | Admitting: Endocrinology

## 2022-07-25 DIAGNOSIS — E049 Nontoxic goiter, unspecified: Secondary | ICD-10-CM

## 2022-07-25 DIAGNOSIS — I10 Essential (primary) hypertension: Secondary | ICD-10-CM | POA: Diagnosis not present

## 2022-07-25 DIAGNOSIS — Z0000 Encounter for general adult medical examination without abnormal findings: Secondary | ICD-10-CM | POA: Diagnosis not present

## 2022-07-25 DIAGNOSIS — G610 Guillain-Barre syndrome: Secondary | ICD-10-CM | POA: Diagnosis not present

## 2022-07-25 DIAGNOSIS — M1990 Unspecified osteoarthritis, unspecified site: Secondary | ICD-10-CM | POA: Diagnosis not present

## 2022-07-25 DIAGNOSIS — R82998 Other abnormal findings in urine: Secondary | ICD-10-CM | POA: Diagnosis not present

## 2022-07-25 DIAGNOSIS — F419 Anxiety disorder, unspecified: Secondary | ICD-10-CM | POA: Diagnosis not present

## 2022-07-25 DIAGNOSIS — F909 Attention-deficit hyperactivity disorder, unspecified type: Secondary | ICD-10-CM | POA: Diagnosis not present

## 2022-07-25 DIAGNOSIS — E039 Hypothyroidism, unspecified: Secondary | ICD-10-CM | POA: Diagnosis not present

## 2022-07-25 DIAGNOSIS — F1010 Alcohol abuse, uncomplicated: Secondary | ICD-10-CM | POA: Diagnosis not present

## 2022-07-25 DIAGNOSIS — G629 Polyneuropathy, unspecified: Secondary | ICD-10-CM | POA: Diagnosis not present

## 2022-07-25 DIAGNOSIS — I7300 Raynaud's syndrome without gangrene: Secondary | ICD-10-CM | POA: Diagnosis not present

## 2022-07-26 DIAGNOSIS — G610 Guillain-Barre syndrome: Secondary | ICD-10-CM | POA: Diagnosis not present

## 2022-07-26 DIAGNOSIS — Z96651 Presence of right artificial knee joint: Secondary | ICD-10-CM | POA: Diagnosis not present

## 2022-07-26 DIAGNOSIS — M24661 Ankylosis, right knee: Secondary | ICD-10-CM | POA: Diagnosis not present

## 2022-07-27 ENCOUNTER — Other Ambulatory Visit (HOSPITAL_BASED_OUTPATIENT_CLINIC_OR_DEPARTMENT_OTHER): Payer: Self-pay

## 2022-07-27 MED ORDER — COVID-19 MRNA 2023-2024 VACCINE (COMIRNATY) 0.3 ML INJECTION
INTRAMUSCULAR | 0 refills | Status: DC
  Filled 2022-07-27: qty 0.3, 1d supply, fill #0

## 2022-07-28 DIAGNOSIS — F330 Major depressive disorder, recurrent, mild: Secondary | ICD-10-CM | POA: Diagnosis not present

## 2022-07-28 DIAGNOSIS — F1010 Alcohol abuse, uncomplicated: Secondary | ICD-10-CM | POA: Diagnosis not present

## 2022-07-28 DIAGNOSIS — F900 Attention-deficit hyperactivity disorder, predominantly inattentive type: Secondary | ICD-10-CM | POA: Diagnosis not present

## 2022-08-01 DIAGNOSIS — J3081 Allergic rhinitis due to animal (cat) (dog) hair and dander: Secondary | ICD-10-CM | POA: Diagnosis not present

## 2022-08-01 DIAGNOSIS — J3089 Other allergic rhinitis: Secondary | ICD-10-CM | POA: Diagnosis not present

## 2022-08-01 DIAGNOSIS — J301 Allergic rhinitis due to pollen: Secondary | ICD-10-CM | POA: Diagnosis not present

## 2022-08-10 DIAGNOSIS — J301 Allergic rhinitis due to pollen: Secondary | ICD-10-CM | POA: Diagnosis not present

## 2022-08-10 DIAGNOSIS — J3081 Allergic rhinitis due to animal (cat) (dog) hair and dander: Secondary | ICD-10-CM | POA: Diagnosis not present

## 2022-08-10 DIAGNOSIS — J3089 Other allergic rhinitis: Secondary | ICD-10-CM | POA: Diagnosis not present

## 2022-08-14 DIAGNOSIS — F330 Major depressive disorder, recurrent, mild: Secondary | ICD-10-CM | POA: Diagnosis not present

## 2022-08-14 DIAGNOSIS — F1010 Alcohol abuse, uncomplicated: Secondary | ICD-10-CM | POA: Diagnosis not present

## 2022-08-14 DIAGNOSIS — F411 Generalized anxiety disorder: Secondary | ICD-10-CM | POA: Diagnosis not present

## 2022-08-14 DIAGNOSIS — F900 Attention-deficit hyperactivity disorder, predominantly inattentive type: Secondary | ICD-10-CM | POA: Diagnosis not present

## 2022-08-15 ENCOUNTER — Telehealth: Payer: Self-pay | Admitting: Neurology

## 2022-08-15 NOTE — Telephone Encounter (Signed)
LVM and sent mychart msg informing pt of need to reschedule 12/28 appointment - NP out

## 2022-08-16 DIAGNOSIS — M25561 Pain in right knee: Secondary | ICD-10-CM | POA: Diagnosis not present

## 2022-08-16 DIAGNOSIS — M24661 Ankylosis, right knee: Secondary | ICD-10-CM | POA: Diagnosis not present

## 2022-08-16 DIAGNOSIS — R936 Abnormal findings on diagnostic imaging of limbs: Secondary | ICD-10-CM | POA: Diagnosis not present

## 2022-08-16 DIAGNOSIS — Z96651 Presence of right artificial knee joint: Secondary | ICD-10-CM | POA: Diagnosis not present

## 2022-08-24 DIAGNOSIS — J301 Allergic rhinitis due to pollen: Secondary | ICD-10-CM | POA: Diagnosis not present

## 2022-08-24 DIAGNOSIS — J3081 Allergic rhinitis due to animal (cat) (dog) hair and dander: Secondary | ICD-10-CM | POA: Diagnosis not present

## 2022-08-24 DIAGNOSIS — J3089 Other allergic rhinitis: Secondary | ICD-10-CM | POA: Diagnosis not present

## 2022-08-25 DIAGNOSIS — F411 Generalized anxiety disorder: Secondary | ICD-10-CM | POA: Diagnosis not present

## 2022-08-25 DIAGNOSIS — F330 Major depressive disorder, recurrent, mild: Secondary | ICD-10-CM | POA: Diagnosis not present

## 2022-08-25 DIAGNOSIS — F900 Attention-deficit hyperactivity disorder, predominantly inattentive type: Secondary | ICD-10-CM | POA: Diagnosis not present

## 2022-08-31 DIAGNOSIS — J3081 Allergic rhinitis due to animal (cat) (dog) hair and dander: Secondary | ICD-10-CM | POA: Diagnosis not present

## 2022-08-31 DIAGNOSIS — J3089 Other allergic rhinitis: Secondary | ICD-10-CM | POA: Diagnosis not present

## 2022-08-31 DIAGNOSIS — J301 Allergic rhinitis due to pollen: Secondary | ICD-10-CM | POA: Diagnosis not present

## 2022-09-12 ENCOUNTER — Other Ambulatory Visit: Payer: PPO

## 2022-09-13 ENCOUNTER — Ambulatory Visit
Admission: RE | Admit: 2022-09-13 | Discharge: 2022-09-13 | Disposition: A | Discharge status: Home / Self Care | Payer: PPO | Source: Ambulatory Visit | Attending: Endocrinology | Admitting: Endocrinology

## 2022-09-13 ENCOUNTER — Encounter (HOSPITAL_BASED_OUTPATIENT_CLINIC_OR_DEPARTMENT_OTHER): Payer: Self-pay | Admitting: *Deleted

## 2022-09-13 ENCOUNTER — Encounter (HOSPITAL_BASED_OUTPATIENT_CLINIC_OR_DEPARTMENT_OTHER): Payer: Self-pay | Admitting: Cardiovascular Disease

## 2022-09-13 ENCOUNTER — Ambulatory Visit (HOSPITAL_BASED_OUTPATIENT_CLINIC_OR_DEPARTMENT_OTHER): Payer: PPO | Admitting: Cardiovascular Disease

## 2022-09-13 VITALS — BP 116/70 | HR 62 | Ht 64.5 in | Wt 130.0 lb

## 2022-09-13 DIAGNOSIS — E049 Nontoxic goiter, unspecified: Secondary | ICD-10-CM

## 2022-09-13 DIAGNOSIS — E782 Mixed hyperlipidemia: Secondary | ICD-10-CM | POA: Diagnosis not present

## 2022-09-13 DIAGNOSIS — I10 Essential (primary) hypertension: Secondary | ICD-10-CM

## 2022-09-13 DIAGNOSIS — E041 Nontoxic single thyroid nodule: Secondary | ICD-10-CM | POA: Diagnosis not present

## 2022-09-13 DIAGNOSIS — H25811 Combined forms of age-related cataract, right eye: Secondary | ICD-10-CM | POA: Diagnosis not present

## 2022-09-13 DIAGNOSIS — H524 Presbyopia: Secondary | ICD-10-CM | POA: Diagnosis not present

## 2022-09-13 DIAGNOSIS — H52223 Regular astigmatism, bilateral: Secondary | ICD-10-CM | POA: Diagnosis not present

## 2022-09-13 DIAGNOSIS — H43813 Vitreous degeneration, bilateral: Secondary | ICD-10-CM | POA: Diagnosis not present

## 2022-09-13 DIAGNOSIS — H0102A Squamous blepharitis right eye, upper and lower eyelids: Secondary | ICD-10-CM | POA: Diagnosis not present

## 2022-09-13 DIAGNOSIS — H5213 Myopia, bilateral: Secondary | ICD-10-CM | POA: Diagnosis not present

## 2022-09-13 NOTE — Telephone Encounter (Signed)
This encounter was created in error - please disregard.

## 2022-09-13 NOTE — Patient Instructions (Signed)
Medication Instructions:  Your physician recommends that you continue on your current medications as directed. Please refer to the Current Medication list given to you today.  *If you need a refill on your cardiac medications before your next appointment, please call your pharmacy*   Lab Work: FASTING LP/CMET SOON IF NOT [place] If you have labs (blood work) drawn today and your tests are completely normal, you will receive your results only by: [place] (if you have MyChart) OR A paper copy in the mail If you have any lab test that is abnormal or we need to change your treatment, we will call you to review the results.   Testing/Procedures: NONE   Follow-Up: At [facility], you and your health needs are our priority.  As part of our continuing mission to provide you with exceptional heart care, we have created designated Provider Care Teams.  These Care Teams include your primary Cardiologist (physician) and Advanced Practice Providers (APPs -  Physician Assistants and Nurse Practitioners) who all work together to provide you with the care you need, when you need it.  We recommend signing up for the patient portal called "MyChart".  Sign up information is provided on this After Visit Summary.  MyChart is used to connect with patients for Virtual Visits (Telemedicine).  Patients are able to view lab/test results, encounter notes, upcoming appointments, etc.  Non-urgent messages can be sent to your provider as well.   To learn more about what you can do with MyChart, go to [email or web address].    Your next appointment:   12 MONTHS   The format for your next appointment:   In Person  Provider:   Skeet Latch, MD or Laurann Montana, NP

## 2022-09-13 NOTE — Progress Notes (Signed)
Cardiology Office Note   Date:  09/13/2022   ID:  REAGHAN KAWA, DOB 07/28/1951, MRN [id number]  PCP:  Prince Solian, MD  Cardiologist:   Skeet Latch, MD   No chief complaint on file.   History of Present Illness: AMARRI MICHAELSON is a 71 y.o. female with hypertension, hyperlipidemia, alcohol abuse, hypothyroidism here for follow-up.  She was first seen 11/2021 for hypertension. She saw her PCP on 10/2021 and reported labile blood pressures.  At that time she was on nebivolol and losartan.  Her losartan has been increased and her blood pressures remained labile.  They noted that she had not always been resting prior to checking her blood pressure.  They also noted alcohol intake and she had been using naltrexone to reduce her intake.  She also noted some exertional symptoms and was referred to cardiology for palpitations, hypertension, and dizziness.  She was first diagnosed with hypertension in her 70s.  She has struggled to control it.  She was bradycardic in the office on nebivolol was discontinued and she was started on chlorthalidone.  We also encouraged her to limit her alcohol and caffeine use, and we questionedwhether Cymbalta may be contributing.  She wore a 3-day monitor for her palpitations and was found to have up to 6 beats of SVT and rare PACs and PVCs.  Average heart rate was 58 with a maximum of 89 and a minimum of 44.  She did report chest pain that was very atypical and not thought to be due to ischemia.  Ms. Semple followed up with Laurann Montana, NP 12/2021.  Blood pressure was 116/74.  Her palpitations were thought to be due to eating anxiety and had improved somewhat after her provider increased the dose of Cymbalta.  She followed up 05/2022 and reported some lightheadedness.  She was hypotensive in the office and chlorthalidone was reduced to 12.5 mg. Since making that change she has been doing well.  She states that her blood pressure has not exceeded 128  with the adjusted dose.    She reports occasional palpitations, lasting no more than 10 seconds.  There is no associated LH or dizziness. Ms. Foresta experiences occasional ankle swelling, particularly in the foot where she underwent surgery several years ago. She notes that the swelling improves in the morning after elevating her feet in bed at night. The patient denies experiencing shortness of breath while lying in bed or during exercise and has a history of knee replacements but no other injuries to her ankles or legs.  She reports having a cholesterol check with Dr. Radene Gunning and has been taking rosuvastatin every night since the summer. Ms. Rufer also mentions having Raynaud's and dry eyes that drip.  Prior antihypertensives:  Amlodipine- palpitations ACE-I-cough  Past Medical History:  Diagnosis Date   Allergy    Anxiety    Asthma    INFREQUENT PROBLEM - rarely used inhaler   Cervical spondylosis    Cervical strain 07/22/2014   Chest pain of uncertain etiology 1/69/6789   Complication of anesthesia    UNKNOWN REACTION WITH RASH AT SITE OF IV only with knee surgery   Degenerative arthritis    Dyslipidemia    Eczema    GERD (gastroesophageal reflux disease)    Guillain-Barre syndrome (HCC)    History of Guillain-Barre syndrome 07/22/2014   Hyperlipidemia    Hypertension    Hypothyroidism    Lumbosacral spondylosis    Palpitations 12/12/2021   Pure hypercholesterolemia 12/13/2021  Raynaud disease    Thyroid nodule     Past Surgical History:  Procedure Laterality Date   ABDOMINAL HYSTERECTOMY     COLONOSCOPY     ESOPHAGOGASTRODUODENOSCOPY (EGD) WITH PROPOFOL N/A 09/13/2021   Procedure: ESOPHAGOGASTRODUODENOSCOPY (EGD) WITH PROPOFOL;  Surgeon: Irene Shipper, MD;  Location: [facility];  Service: Endoscopy;  Laterality: N/A;   FOOT SURGERY Left    GANGLION CYST EXCISION Left    Wrist   HERNIA REPAIR     KNEE ARTHROSCOPY     RT KNEE   KNEE ARTHROTOMY Right 08/27/2013    Procedure: RIGHT KNEE SCAR EXCISION AND FEMORAL REVISION;  Surgeon: Gearlean Alf, MD;  Location: [facility];  Service: Orthopedics;  Laterality: Right;   REPLACEMENT TOTAL KNEE Right    2014, 2021   REPLACEMENT TOTAL KNEE BILATERAL Bilateral    STRABISMUS SURGERY Right    TONSILLECTOMY     vaginal vulvo prolapse  09/16/2015     Current Outpatient Medications  Medication Sig Dispense Refill   acetaminophen (TYLENOL) 650 MG CR tablet Take 1,300 mg by mouth every 8 (eight) hours as needed for pain or fever.     albuterol (PROVENTIL HFA;VENTOLIN HFA) 108 (90 BASE) MCG/ACT inhaler Inhale 2 puffs into the lungs every 6 (six) hours as needed for wheezing or shortness of breath.     amoxicillin (AMOXIL) 500 MG capsule Take 2,000 mg by mouth once as needed (prior to dental appointments).     Ascorbic Acid (VITAMIN C PO) Take 1 tablet by mouth daily as needed (sick symptoms).     azelastine (ASTELIN) 0.1 % nasal spray Place 1 spray into both nostrils 2 (two) times daily as needed (cold symptoms).     chlorthalidone (HYGROTON) 25 MG tablet Take 0.5 tablets (12.5 mg total) by mouth daily. 45 tablet 3   Cholecalciferol (VITAMIN D) 2000 UNITS CAPS Take 2,000 Units by mouth daily in the afternoon.     COVID-19 mRNA vaccine 2023-2024 (COMIRNATY) SUSP injection Inject into the muscle. 0.3 mL 0   Emollient (CETAPHIL) cream Apply 1 application topically daily.     EPINEPHrine 0.3 mg/0.3 mL IJ SOAJ injection Inject 0.3 mg into the muscle once as needed for anaphylaxis.     guaiFENesin (MUCINEX) 600 MG 12 hr tablet Take 1,200 mg by mouth daily as needed.     ibuprofen (ADVIL,MOTRIN) 100 MG tablet Take 400 mg by mouth daily at 12 noon. May take in the pm additionally as needed     ketoconazole (NIZORAL) 2 % cream Apply 1 application topically daily. (Patient taking differently: Apply 1 application  topically daily as needed.) 60 g 0   levothyroxine (SYNTHROID) 50 MCG tablet Take 50 mcg by mouth daily before  breakfast.      loratadine (CLARITIN) 10 MG tablet Take 10 mg by mouth daily.     losartan (COZAAR) 50 MG tablet Take 1 tablet (50 mg total) by mouth 2 (two) times daily. 180 tablet 1   meloxicam (MOBIC) 15 MG tablet Take 15 mg by mouth daily.     methylphenidate (DAYTRANA) 20 MG/9HR Place 1 patch onto the skin every morning.     montelukast (SINGULAIR) 10 MG tablet Take 10 mg by mouth at bedtime.     naltrexone (DEPADE) 50 MG tablet TAKE ONE TABLET IN THE MORNING     pantoprazole (PROTONIX) 40 MG tablet Take 40 mg by mouth daily.     PARoxetine (PAXIL) 40 MG tablet Take by mouth at bedtime.  Propylene Glycol-Glycerin (SOOTHE) 0.6-0.6 % SOLN Place 1 drop into both eyes daily.     rosuvastatin (CRESTOR) 5 MG tablet Take 1 tablet (5 mg total) by mouth every other day. 90 tablet 1   Simethicone (PHAZYME PO) Take 250 mg by mouth daily as needed (Gas).     triamcinolone cream (KENALOG) 0.1 % 1 application as needed (itching bumps on stomach). Mixed with Cetaphil     No current facility-administered medications for this visit.    Allergies:   Buspar [buspirone], Captopril, Oxaprozin, Ramipril, Zoloft [sertraline hcl], Ace inhibitors, Doxycycline, Influenza vaccines, and Sulfa antibiotics    Social History:  The patient  reports that she has never smoked. She has never used smokeless tobacco. She reports current alcohol use of about 6.0 standard drinks of alcohol per week. She reports that she does not use drugs.   Family History:  The patient's family history includes Heart attack in her father; Heart disease in her father; Hemochromatosis in her brother and father; Hyperlipidemia in her brother; Hypertension in her brother and mother; Other in her brother and mother; Stomach cancer in her paternal uncle; Stroke in her mother.    ROS:  Please see the history of present illness.   Otherwise, review of systems are positive for none.   All other systems are reviewed and negative.    PHYSICAL  EXAM: VS:  BP 116/70 (BP Location: Left Arm, Patient Position: Sitting, Cuff Size: Normal)   Pulse 62   Ht 5' 4.5" (1.638 m)   Wt 130 lb (59 kg)   SpO2 94%   BMI 21.97 kg/m  , BMI Body mass index is 21.97 kg/m. GENERAL:  Well appearing HEENT: Pupils equal round and reactive, fundi not visualized, oral mucosa unremarkable NECK:  No jugular venous distention, waveform within normal limits, carotid upstroke brisk and symmetric, no bruits, no thyromegaly LUNGS:  Clear to auscultation bilaterally HEART:  RRR.  PMI not displaced or sustained,S1 and S2 within normal limits, no S3, no S4, no clicks, no rubs, no murmurs ABD:  Flat, positive bowel sounds normal in frequency in pitch, no bruits, no rebound, no guarding, no midline pulsatile mass, no hepatomegaly, no splenomegaly EXT:  2 plus pulses throughout, no edema, no cyanosis no clubbing SKIN:  No rashes no nodules NEURO:  Cranial nerves II through XII grossly intact, motor grossly intact throughout PSYCH:  Cognitively intact, oriented to person place and time  EKG:  EKG is ordered today. The ekg ordered today demonstrates sinus bradycardia.  Rate 52 bpm.     Recent Labs: 01/10/2022: BUN 19; Creatinine, Ser 0.99; Potassium 3.8; Sodium 144 05/12/2022: ALT 21; Hemoglobin 14.4; Platelets 233    Lipid Panel No results found for: "CHOL", "TRIG", "HDL", "CHOLHDL", "VLDL", "LDLCALC", "LDLDIRECT"    Wt Readings from Last 3 Encounters:  09/13/22 130 lb (59 kg)  05/16/22 125 lb (56.7 kg)  01/10/22 130 lb 9.6 oz (59.2 kg)      ASSESSMENT AND PLAN:  1. Hypertension: - Patient reports blood pressure has been well-controlled with the reduced dose of chlorthalidone (12.5 mg) and losartan. Blood pressure during the visit was within the normal range.  2. Dizziness and positional changes: - Patient reports improvement in dizziness after reducing chlorthalidone dose. - Educate the patient on the importance of hydration and being cautious with  aerobic exercises involving rapid positional changes.  3. Occasional palpitations: - Patient reports brief episodes of palpitations lasting around 10 seconds. - No intervention needed at this time due  to the benign nature of the palpitations and the patient's history of low heart rate.   Encouraged her to hydrate  4. Hyperlipidemia: - She is due to recheck after increasing her statin this summer.   She will come back for fasting lipids/CMP. - Continue rosuvastatin daily dosing.   The following changes have been made:    Labs/ tests ordered today include:   Orders Placed This Encounter  Procedures   Lipid panel   Comprehensive metabolic panel     Disposition:   FU with Aki Burdin C. Oval Linsey, MD, [facility] in 1 year    Signed, Gino Garrabrant C. Oval Linsey, MD, [facility]  09/13/2022 9:17 AM    [place]

## 2022-09-14 ENCOUNTER — Ambulatory Visit (INDEPENDENT_AMBULATORY_CARE_PROVIDER_SITE_OTHER): Payer: PPO | Admitting: Podiatry

## 2022-09-14 ENCOUNTER — Encounter (HOSPITAL_BASED_OUTPATIENT_CLINIC_OR_DEPARTMENT_OTHER): Payer: Self-pay

## 2022-09-14 DIAGNOSIS — J3081 Allergic rhinitis due to animal (cat) (dog) hair and dander: Secondary | ICD-10-CM | POA: Diagnosis not present

## 2022-09-14 DIAGNOSIS — M79674 Pain in right toe(s): Secondary | ICD-10-CM | POA: Diagnosis not present

## 2022-09-14 DIAGNOSIS — M79675 Pain in left toe(s): Secondary | ICD-10-CM

## 2022-09-14 DIAGNOSIS — B351 Tinea unguium: Secondary | ICD-10-CM

## 2022-09-14 DIAGNOSIS — E782 Mixed hyperlipidemia: Secondary | ICD-10-CM | POA: Diagnosis not present

## 2022-09-14 DIAGNOSIS — J301 Allergic rhinitis due to pollen: Secondary | ICD-10-CM | POA: Diagnosis not present

## 2022-09-14 DIAGNOSIS — J3089 Other allergic rhinitis: Secondary | ICD-10-CM | POA: Diagnosis not present

## 2022-09-14 DIAGNOSIS — I10 Essential (primary) hypertension: Secondary | ICD-10-CM | POA: Diagnosis not present

## 2022-09-15 LAB — COMPREHENSIVE METABOLIC PANEL
ALT: 20 IU/L (ref 0–32)
AST: 24 IU/L (ref 0–40)
Albumin/Globulin Ratio: 2.9 — ABNORMAL HIGH (ref 1.2–2.2)
Albumin: 4.7 g/dL (ref 3.8–4.8)
Alkaline Phosphatase: 79 IU/L (ref 44–121)
BUN/Creatinine Ratio: 20 (ref 12–28)
BUN: 18 mg/dL (ref 8–27)
Bilirubin Total: 0.4 mg/dL (ref 0.0–1.2)
CO2: 27 mmol/L (ref 20–29)
Calcium: 9.8 mg/dL (ref 8.7–10.3)
Chloride: 104 mmol/L (ref 96–106)
Creatinine, Ser: 0.91 mg/dL (ref 0.57–1.00)
Globulin, Total: 1.6 g/dL (ref 1.5–4.5)
Glucose: 91 mg/dL (ref 70–99)
Potassium: 4.5 mmol/L (ref 3.5–5.2)
Sodium: 146 mmol/L — ABNORMAL HIGH (ref 134–144)
Total Protein: 6.3 g/dL (ref 6.0–8.5)
eGFR: 67 mL/min/{1.73_m2} (ref >59)

## 2022-09-15 LAB — LIPID PANEL
Chol/HDL Ratio: 2.5 ratio (ref 0.0–4.4)
Cholesterol, Total: 156 mg/dL (ref 100–199)
HDL: 62 mg/dL (ref >39)
LDL Chol Calc (NIH): 80 mg/dL (ref 0–99)
Triglycerides: 70 mg/dL (ref 0–149)
VLDL Cholesterol Cal: 14 mg/dL (ref 5–40)

## 2022-09-17 NOTE — Progress Notes (Signed)
Subjective: No chief complaint on file.   71 year old female presents the office today for follow-up evaluation of her right big toenail dystrophy, ingrown toenail on both big toes.  No swelling redness or drainage.  Asking them trimmed today if they get painful.  No other concerns.   Objective: AAO x3, NAD DP/PT pulses palpable bilaterally, CRT less than 3 seconds Right hallux nail is growing in much better and has more clear color.  The distal aspect has some mild dystrophic with brown discoloration but clinically appears to continue to improve.  Mild incurvation of left hallux toenail.  There is no edema, erythema, drainage approximately signs of infection to the toenail sites. No pain with calf compression, swelling, warmth, erythema  Assessment: Onychomycosis, onychodystrophy-on Lamisil  Plan: -All treatment options discussed with the patient including all alternatives, risks, complications.  -Debrided bilateral hallux nails with complications of bleeding.  -Patient encouraged to call the office with any questions, concerns, change in symptoms.   Trula Slade DPM

## 2022-09-20 DIAGNOSIS — Z96651 Presence of right artificial knee joint: Secondary | ICD-10-CM | POA: Diagnosis not present

## 2022-09-20 DIAGNOSIS — T84038D Mechanical loosening of other internal prosthetic joint, subsequent encounter: Secondary | ICD-10-CM | POA: Diagnosis not present

## 2022-09-20 DIAGNOSIS — T8484XD Pain due to internal orthopedic prosthetic devices, implants and grafts, subsequent encounter: Secondary | ICD-10-CM | POA: Diagnosis not present

## 2022-09-20 DIAGNOSIS — Z96659 Presence of unspecified artificial knee joint: Secondary | ICD-10-CM | POA: Diagnosis not present

## 2022-09-28 DIAGNOSIS — J3089 Other allergic rhinitis: Secondary | ICD-10-CM | POA: Diagnosis not present

## 2022-09-28 DIAGNOSIS — J3081 Allergic rhinitis due to animal (cat) (dog) hair and dander: Secondary | ICD-10-CM | POA: Diagnosis not present

## 2022-09-28 DIAGNOSIS — J301 Allergic rhinitis due to pollen: Secondary | ICD-10-CM | POA: Diagnosis not present

## 2022-09-29 DIAGNOSIS — F900 Attention-deficit hyperactivity disorder, predominantly inattentive type: Secondary | ICD-10-CM | POA: Diagnosis not present

## 2022-09-29 DIAGNOSIS — F330 Major depressive disorder, recurrent, mild: Secondary | ICD-10-CM | POA: Diagnosis not present

## 2022-09-29 DIAGNOSIS — F411 Generalized anxiety disorder: Secondary | ICD-10-CM | POA: Diagnosis not present

## 2022-10-05 DIAGNOSIS — H903 Sensorineural hearing loss, bilateral: Secondary | ICD-10-CM | POA: Diagnosis not present

## 2022-10-06 DIAGNOSIS — J3081 Allergic rhinitis due to animal (cat) (dog) hair and dander: Secondary | ICD-10-CM | POA: Diagnosis not present

## 2022-10-06 DIAGNOSIS — J301 Allergic rhinitis due to pollen: Secondary | ICD-10-CM | POA: Diagnosis not present

## 2022-10-06 DIAGNOSIS — J3089 Other allergic rhinitis: Secondary | ICD-10-CM | POA: Diagnosis not present

## 2022-10-12 ENCOUNTER — Ambulatory Visit: Payer: PPO | Admitting: Neurology

## 2022-10-13 DIAGNOSIS — J3089 Other allergic rhinitis: Secondary | ICD-10-CM | POA: Diagnosis not present

## 2022-10-17 NOTE — Progress Notes (Signed)
PATIENT: Brianna Gates DOB: 1950/12/06  REASON FOR VISIT: Follow up for GBS HISTORY FROM: Patient PRIMARY NEUROLOGIST: Dr. Terrace Arabia since Dr. Anne Hahn has retired   HISTORY OF PRESENT ILLNESS: Today 10/18/22 Wants to discuss getting back on gabapentin. Tried Cymbalta. When on gabapentin before when drinking alcohol, made her feel drunk. Tried higher dose Cymbalta didn't help. She does go to [employer]. Is no longer drinking. Still has numbness to feet, tingling all day. Worse on hardwood floors. Doesn't keep her up at night, has never been a good sleeper. Feels sand between her toes. Walking is better with exercise, had right knee replacement, has scar tissue, is now stiff. Has stopped drink etoh since summer. Seeing psychiatry, for drinking mostly.   Update 10/06/21 SS: Brianna Gates here today for follow-up with history of Guillain Barr syndrome.  Is on Cymbalta for uncomfortable sensory alterations in the feet. Gabapentin made her feel drowsy. Taking Cymbalta 60 mg daily. Mostly feels like sand in between toes, ball of foot. Can't walk barefoot. No falls. The sensation isn't painful. Her right hand can tingle with overuse. Wants to stay on Cymbalta for now. Is going to see a therapist to see about ADD. If they put her on a mood medication, she may go back to gabapentin. She does water aerobics. Here alone.   HISTORY  04/07/2021 Dr. Anne Hahn: Brianna Gates is a 72 year old right-handed white female with a history of Guillain-Barre syndrome in the past.  The patient has had some residual numbness and tingling in the feet, she has some mild balance issues but she has not had any falls.  She recently had a redo of a knee replacement on the right, she has done well with recovery.  She recently had a COVID infection in January 2022.  The patient has recovered from this.  She has been on gabapentin for some of the discomfort in the feet, but she has had a lot of daytime drowsiness and some gait instability in the  evening when she took the medication.  She has stopped the medication at this point.  She is on Cymbalta taking 20 mg daily but she does not believe this helps her symptoms.  REVIEW OF SYSTEMS: Out of a complete 14 system review of symptoms, the patient complains only of the following symptoms, and all other reviewed systems are negative.  See HPI  ALLERGIES: Allergies  Allergen Reactions   Buspar [Buspirone] Itching    Vivid dreams, diarrhea    Captopril Other (See Comments)    reaction   Oxaprozin Hives and Other (See Comments)   Ramipril Cough and Other (See Comments)   Zoloft [Sertraline Hcl] Diarrhea   Ace Inhibitors     Other reaction(s): Other (See Comments) GB syndrome   Doxycycline Other (See Comments)    Burning sensation/Rash   Influenza Vac Split Quad     Other Reaction(s): Guillain-Barr syndrome   Influenza Vaccines     HX    Sulfa Antibiotics Other (See Comments)    unknown   Amlodipine Besylate Palpitations    HOME MEDICATIONS: Outpatient Medications Prior to Visit  Medication Sig Dispense Refill   acetaminophen (TYLENOL) 650 MG CR tablet Take 1,300 mg by mouth every 8 (eight) hours as needed for pain or fever.     albuterol (PROVENTIL HFA;VENTOLIN HFA) 108 (90 BASE) MCG/ACT inhaler Inhale 2 puffs into the lungs every 6 (six) hours as needed for wheezing or shortness of breath.     Ascorbic Acid (VITAMIN C PO)  Take 1 tablet by mouth daily as needed (sick symptoms).     azelastine (ASTELIN) 0.1 % nasal spray Place 1 spray into both nostrils 2 (two) times daily as needed (cold symptoms).     chlorthalidone (HYGROTON) 25 MG tablet Take 0.5 tablets (12.5 mg total) by mouth daily. 45 tablet 3   Cholecalciferol (VITAMIN D) 2000 UNITS CAPS Take 2,000 Units by mouth daily in the afternoon.     COVID-19 mRNA vaccine 2023-2024 (COMIRNATY) SUSP injection Inject into the muscle. 0.3 mL 0   Emollient (CETAPHIL) cream Apply 1 application topically daily.     EPINEPHrine 0.3  mg/0.3 mL IJ SOAJ injection Inject 0.3 mg into the muscle once as needed for anaphylaxis.     guaiFENesin (MUCINEX) 600 MG 12 hr tablet Take 1,200 mg by mouth daily as needed.     ibuprofen (ADVIL,MOTRIN) 100 MG tablet Take 400 mg by mouth daily at 12 noon. May take in the pm additionally as needed     ketoconazole (NIZORAL) 2 % cream Apply 1 application topically daily. (Patient taking differently: Apply 1 application  topically daily as needed.) 60 g 0   levothyroxine (SYNTHROID) 50 MCG tablet Take 50 mcg by mouth daily before breakfast.      loratadine (CLARITIN) 10 MG tablet Take 10 mg by mouth daily.     losartan (COZAAR) 50 MG tablet Take 1 tablet (50 mg total) by mouth 2 (two) times daily. 180 tablet 1   meloxicam (MOBIC) 15 MG tablet Take 15 mg by mouth daily.     methylphenidate (DAYTRANA) 20 MG/9HR Place 1 patch onto the skin every morning.     montelukast (SINGULAIR) 10 MG tablet Take 10 mg by mouth at bedtime.     naltrexone (DEPADE) 50 MG tablet TAKE ONE TABLET IN THE MORNING     pantoprazole (PROTONIX) 40 MG tablet Take 40 mg by mouth daily.     PARoxetine (PAXIL) 40 MG tablet Take by mouth at bedtime.     rosuvastatin (CRESTOR) 5 MG tablet Take 1 tablet (5 mg total) by mouth every other day. 90 tablet 1   Simethicone (PHAZYME PO) Take 250 mg by mouth daily as needed (Gas).     triamcinolone cream (KENALOG) 0.1 % 1 application as needed (itching bumps on stomach). Mixed with Cetaphil     Varenicline Tartrate 0.03 MG/ACT SOLN Place 1 spray into the nose 2 (two) times daily.     amoxicillin (AMOXIL) 500 MG capsule Take 2,000 mg by mouth once as needed (prior to dental appointments). (Patient not taking: Reported on 10/18/2022)     Propylene Glycol-Glycerin (SOOTHE) 0.6-0.6 % SOLN Place 1 drop into both eyes daily.     No facility-administered medications prior to visit.    PAST MEDICAL HISTORY: Past Medical History:  Diagnosis Date   Allergy    Anxiety    Asthma    INFREQUENT  PROBLEM - rarely used inhaler   Cervical spondylosis    Cervical strain 07/22/2014   Chest pain of uncertain etiology 12/13/2021   Complication of anesthesia    UNKNOWN REACTION WITH RASH AT SITE OF IV only with knee surgery   Degenerative arthritis    Dyslipidemia    Eczema    GERD (gastroesophageal reflux disease)    Guillain-Barre syndrome (HCC)    History of Guillain-Barre syndrome 07/22/2014   Hyperlipidemia    Hypertension    Hypothyroidism    Lumbosacral spondylosis    Palpitations 12/12/2021   Pure hypercholesterolemia 12/13/2021  Raynaud disease    Thyroid nodule     PAST SURGICAL HISTORY: Past Surgical History:  Procedure Laterality Date   ABDOMINAL HYSTERECTOMY     COLONOSCOPY     ESOPHAGOGASTRODUODENOSCOPY (EGD) WITH PROPOFOL N/A 09/13/2021   Procedure: ESOPHAGOGASTRODUODENOSCOPY (EGD) WITH PROPOFOL;  Surgeon: Hilarie Fredrickson, MD;  Location: [facility];  Service: Endoscopy;  Laterality: N/A;   FOOT SURGERY Left    GANGLION CYST EXCISION Left    Wrist   HERNIA REPAIR     KNEE ARTHROSCOPY     RT KNEE   KNEE ARTHROTOMY Right 08/27/2013   Procedure: RIGHT KNEE SCAR EXCISION AND FEMORAL REVISION;  Surgeon: Loanne Drilling, MD;  Location: [facility];  Service: Orthopedics;  Laterality: Right;   REPLACEMENT TOTAL KNEE Right    2014, 2021   REPLACEMENT TOTAL KNEE BILATERAL Bilateral    STRABISMUS SURGERY Right    TONSILLECTOMY     vaginal vulvo prolapse  09/16/2015    FAMILY HISTORY: Family History  Problem Relation Age of Onset   Hypertension Mother    Other Mother        Dyslipidemia   Stroke Mother    Heart attack Father        31s   Heart disease Father    Hemochromatosis Father    Hemochromatosis Brother    Hyperlipidemia Brother    Hypertension Brother    Other Brother        Dyslipidemia   Stomach cancer Paternal Uncle    Colon cancer Neg Hx    Rectal cancer Neg Hx    Esophageal cancer Neg Hx     SOCIAL HISTORY: Social History   Socioeconomic  History   Marital status: Married    Spouse name: Not on file   Number of children: 2   Years of education: college   Highest education level: Not on file  Occupational History    Employer: UNEMPLOYED  Tobacco Use   Smoking status: Never   Smokeless tobacco: Never  Vaping Use   Vaping Use: Never used  Substance and Sexual Activity   Alcohol use: Not Currently    Comment: stopped drinking   Drug use: No   Sexual activity: Yes    Birth control/protection: Other-see comments, Post-menopausal    Comment: Hysterectomy  Other Topics Concern   Not on file  Social History Narrative   Lives at home, married   Patient is right handed.   Patient drinks 2-3 cups caffeine daily.   Social Determinants of Health   Financial Resource Strain: Not on file  Food Insecurity: Not on file  Transportation Needs: Not on file  Physical Activity: Not on file  Stress: Not on file  Social Connections: Not on file  Intimate Partner Violence: Not on file   PHYSICAL EXAM  Vitals:   10/18/22 0927  BP: 115/70  Pulse: (!) 58  Weight: 123 lb 8 oz (56 kg)  Height: 5\' 4"  (1.626 m)   Body mass index is 21.2 kg/m.  Generalized: Well developed, in no acute distress  Neurological examination  Mentation: Alert oriented to time, place, history taking. Follows all commands speech and language fluent Cranial nerve II-XII: Pupils were equal round reactive to light. Extraocular movements were full, visual field were full on confrontational test. Facial sensation and strength were normal. Head turning and shoulder shrug were normal and symmetric. Motor: The motor testing reveals 5 over 5 strength of all 4 extremities. Good symmetric motor tone is noted throughout.  Sensory:  Soft touch, vibration sensation is intact Coordination: Cerebellar testing reveals good finger-nose-finger and heel-to-shin bilaterally.  Gait and station: Gait is normal, steady, with walking barefoot, her toes tend to grip the  ground,harder to walk on tip toe, tandem gait is steady forward and back Reflexes: Deep tendon reflexes are symmetric but slightly increased DIAGNOSTIC DATA (LABS, IMAGING, TESTING) - I reviewed patient records, labs, notes, testing and imaging myself where available.  Lab Results  Component Value Date   WBC 5.9 05/12/2022   HGB 14.4 05/12/2022   HCT 42.8 05/12/2022   MCV 98.8 05/12/2022   PLT 233 05/12/2022      Component Value Date/Time   NA 146 (H) 09/14/2022 0934   K 4.5 09/14/2022 0934   CL 104 09/14/2022 0934   CO2 27 09/14/2022 0934   GLUCOSE 91 09/14/2022 0934   GLUCOSE 100 (H) 10/30/2021 0808   BUN 18 09/14/2022 0934   CREATININE 0.91 09/14/2022 0934   CALCIUM 9.8 09/14/2022 0934   PROT 6.3 09/14/2022 0934   ALBUMIN 4.7 09/14/2022 0934   AST 24 09/14/2022 0934   ALT 20 09/14/2022 0934   ALKPHOS 79 09/14/2022 0934   BILITOT 0.4 09/14/2022 0934   GFRNONAA >60 10/30/2021 0808   GFRAA >90 08/29/2013 0405   Lab Results  Component Value Date   CHOL 156 09/14/2022   HDL 62 09/14/2022   LDLCALC 80 09/14/2022   TRIG 70 09/14/2022   CHOLHDL 2.5 09/14/2022   No results found for: "HGBA1C" No results found for: "VITAMINB12" Lab Results  Component Value Date   TSH 1.068 12/27/2020   ASSESSMENT AND PLAN 72 y.o. year old female  has a past medical history of Allergy, Anxiety, Asthma, Cervical spondylosis, Cervical strain (07/22/2014), Chest pain of uncertain etiology (12/13/2021), Complication of anesthesia, Degenerative arthritis, Dyslipidemia, Eczema, GERD (gastroesophageal reflux disease), Guillain-Barre syndrome (HCC), History of Guillain-Barre syndrome (07/22/2014), Hyperlipidemia, Hypertension, Hypothyroidism, Lumbosacral spondylosis, Palpitations (12/12/2021), Pure hypercholesterolemia (12/13/2021), Raynaud disease, and Thyroid nodule. here with:  History of Guillain-Barre Syndrome with residual lower extremity sensory symptoms -Retry gabapentin starting with 300 mg up  to 3 times daily, monitor for side effects -Did not see any benefit from Cymbalta -Follow-up in 6 months or sooner if needed  Otila Kluver, DNP 10/18/2022, 9:31 AM [facility] [address] [place], [place] [address] [phone]

## 2022-10-18 ENCOUNTER — Ambulatory Visit: Payer: PPO | Admitting: Neurology

## 2022-10-18 ENCOUNTER — Encounter: Payer: Self-pay | Admitting: Neurology

## 2022-10-18 VITALS — BP 115/70 | HR 58 | Ht 64.0 in | Wt 123.5 lb

## 2022-10-18 DIAGNOSIS — G610 Guillain-Barre syndrome: Secondary | ICD-10-CM | POA: Diagnosis not present

## 2022-10-18 DIAGNOSIS — R202 Paresthesia of skin: Secondary | ICD-10-CM | POA: Diagnosis not present

## 2022-10-18 MED ORDER — GABAPENTIN 300 MG PO CAPS: 300.0000 mg | ORAL_CAPSULE | Freq: Three times a day (TID) | ORAL | 5 refills | Status: DC

## 2022-10-18 NOTE — Patient Instructions (Signed)
Try gabapentin 300 mg up to 3 times daily, start taking twice daily, to monitor for side effects  See you back in 6 months

## 2022-10-20 DIAGNOSIS — J3089 Other allergic rhinitis: Secondary | ICD-10-CM | POA: Diagnosis not present

## 2022-10-27 DIAGNOSIS — J3089 Other allergic rhinitis: Secondary | ICD-10-CM | POA: Diagnosis not present

## 2022-10-27 DIAGNOSIS — J3081 Allergic rhinitis due to animal (cat) (dog) hair and dander: Secondary | ICD-10-CM | POA: Diagnosis not present

## 2022-10-27 DIAGNOSIS — J301 Allergic rhinitis due to pollen: Secondary | ICD-10-CM | POA: Diagnosis not present

## 2022-10-31 DIAGNOSIS — J3089 Other allergic rhinitis: Secondary | ICD-10-CM | POA: Diagnosis not present

## 2022-10-31 DIAGNOSIS — J301 Allergic rhinitis due to pollen: Secondary | ICD-10-CM | POA: Diagnosis not present

## 2022-10-31 DIAGNOSIS — J3081 Allergic rhinitis due to animal (cat) (dog) hair and dander: Secondary | ICD-10-CM | POA: Diagnosis not present

## 2022-11-06 ENCOUNTER — Other Ambulatory Visit: Payer: Self-pay | Admitting: Endocrinology

## 2022-11-06 DIAGNOSIS — F330 Major depressive disorder, recurrent, mild: Secondary | ICD-10-CM | POA: Diagnosis not present

## 2022-11-06 DIAGNOSIS — F411 Generalized anxiety disorder: Secondary | ICD-10-CM | POA: Diagnosis not present

## 2022-11-06 DIAGNOSIS — F900 Attention-deficit hyperactivity disorder, predominantly inattentive type: Secondary | ICD-10-CM | POA: Diagnosis not present

## 2022-11-06 DIAGNOSIS — E049 Nontoxic goiter, unspecified: Secondary | ICD-10-CM

## 2022-11-10 DIAGNOSIS — J3089 Other allergic rhinitis: Secondary | ICD-10-CM | POA: Diagnosis not present

## 2022-11-10 DIAGNOSIS — J301 Allergic rhinitis due to pollen: Secondary | ICD-10-CM | POA: Diagnosis not present

## 2022-11-10 DIAGNOSIS — J3081 Allergic rhinitis due to animal (cat) (dog) hair and dander: Secondary | ICD-10-CM | POA: Diagnosis not present

## 2022-11-16 DIAGNOSIS — J3089 Other allergic rhinitis: Secondary | ICD-10-CM | POA: Diagnosis not present

## 2022-11-16 DIAGNOSIS — J3081 Allergic rhinitis due to animal (cat) (dog) hair and dander: Secondary | ICD-10-CM | POA: Diagnosis not present

## 2022-11-16 DIAGNOSIS — J301 Allergic rhinitis due to pollen: Secondary | ICD-10-CM | POA: Diagnosis not present

## 2022-11-20 ENCOUNTER — Ambulatory Visit: Payer: PPO | Admitting: Podiatry

## 2022-11-20 DIAGNOSIS — M79675 Pain in left toe(s): Secondary | ICD-10-CM | POA: Diagnosis not present

## 2022-11-20 DIAGNOSIS — B351 Tinea unguium: Secondary | ICD-10-CM

## 2022-11-20 DIAGNOSIS — M79674 Pain in right toe(s): Secondary | ICD-10-CM

## 2022-11-20 DIAGNOSIS — H0102B Squamous blepharitis left eye, upper and lower eyelids: Secondary | ICD-10-CM | POA: Diagnosis not present

## 2022-11-20 DIAGNOSIS — H0102A Squamous blepharitis right eye, upper and lower eyelids: Secondary | ICD-10-CM | POA: Diagnosis not present

## 2022-11-22 DIAGNOSIS — J3089 Other allergic rhinitis: Secondary | ICD-10-CM | POA: Diagnosis not present

## 2022-11-22 DIAGNOSIS — F330 Major depressive disorder, recurrent, mild: Secondary | ICD-10-CM | POA: Diagnosis not present

## 2022-11-22 DIAGNOSIS — J3081 Allergic rhinitis due to animal (cat) (dog) hair and dander: Secondary | ICD-10-CM | POA: Diagnosis not present

## 2022-11-22 DIAGNOSIS — F1021 Alcohol dependence, in remission: Secondary | ICD-10-CM | POA: Diagnosis not present

## 2022-11-22 DIAGNOSIS — J301 Allergic rhinitis due to pollen: Secondary | ICD-10-CM | POA: Diagnosis not present

## 2022-11-22 DIAGNOSIS — F900 Attention-deficit hyperactivity disorder, predominantly inattentive type: Secondary | ICD-10-CM | POA: Diagnosis not present

## 2022-11-22 NOTE — Progress Notes (Signed)
Subjective: Chief Complaint  Patient presents with   Nail Problem    Thick painful toenails, 2 month follow up     72 year old female presents the office today for follow-up evaluation of her right big toenail dystrophy, ingrown toenail on both big toes.  No drainage or pus.  No other concerns.  Objective: AAO x3, NAD DP/PT pulses palpable bilaterally, CRT less than 3 seconds Right hallux nail is growing in much better and has more clear color.  Prior to debridement, bilateral nails do have some white discoloration but this is improved after debridement, filing.  There is no drainage or pus.  She gets some occasional discomfort at the distal aspect of the toenail on the ingrown portion but no other areas of discomfort.  No pain with calf compression, swelling, warmth, erythema  Assessment: Onychomycosis, onychodystrophy  Plan: -All treatment options discussed with the patient including all alternatives, risks, complications.  -Debrided bilateral hallux nails with complications of bleeding.  The nails appear to be growing out however this will continue to improve.  Monitor for any signs or symptoms of infection.  Trula Slade DPM

## 2022-11-28 DIAGNOSIS — G8929 Other chronic pain: Secondary | ICD-10-CM | POA: Diagnosis not present

## 2022-11-28 DIAGNOSIS — Z96651 Presence of right artificial knee joint: Secondary | ICD-10-CM | POA: Diagnosis not present

## 2022-11-28 DIAGNOSIS — T8484XA Pain due to internal orthopedic prosthetic devices, implants and grafts, initial encounter: Secondary | ICD-10-CM | POA: Diagnosis not present

## 2022-11-28 DIAGNOSIS — M24661 Ankylosis, right knee: Secondary | ICD-10-CM | POA: Diagnosis not present

## 2022-11-28 DIAGNOSIS — M25561 Pain in right knee: Secondary | ICD-10-CM | POA: Diagnosis not present

## 2022-11-30 DIAGNOSIS — F330 Major depressive disorder, recurrent, mild: Secondary | ICD-10-CM | POA: Diagnosis not present

## 2022-11-30 DIAGNOSIS — F900 Attention-deficit hyperactivity disorder, predominantly inattentive type: Secondary | ICD-10-CM | POA: Diagnosis not present

## 2022-11-30 DIAGNOSIS — F1021 Alcohol dependence, in remission: Secondary | ICD-10-CM | POA: Diagnosis not present

## 2022-12-01 ENCOUNTER — Other Ambulatory Visit: Payer: Self-pay | Admitting: Internal Medicine

## 2022-12-04 DIAGNOSIS — L245 Irritant contact dermatitis due to other chemical products: Secondary | ICD-10-CM | POA: Diagnosis not present

## 2022-12-04 DIAGNOSIS — D225 Melanocytic nevi of trunk: Secondary | ICD-10-CM | POA: Diagnosis not present

## 2022-12-04 DIAGNOSIS — L570 Actinic keratosis: Secondary | ICD-10-CM | POA: Diagnosis not present

## 2022-12-04 DIAGNOSIS — L821 Other seborrheic keratosis: Secondary | ICD-10-CM | POA: Diagnosis not present

## 2022-12-04 DIAGNOSIS — L218 Other seborrheic dermatitis: Secondary | ICD-10-CM | POA: Diagnosis not present

## 2022-12-04 DIAGNOSIS — L853 Xerosis cutis: Secondary | ICD-10-CM | POA: Diagnosis not present

## 2022-12-06 DIAGNOSIS — J3081 Allergic rhinitis due to animal (cat) (dog) hair and dander: Secondary | ICD-10-CM | POA: Diagnosis not present

## 2022-12-06 DIAGNOSIS — J3089 Other allergic rhinitis: Secondary | ICD-10-CM | POA: Diagnosis not present

## 2022-12-06 DIAGNOSIS — J301 Allergic rhinitis due to pollen: Secondary | ICD-10-CM | POA: Diagnosis not present

## 2022-12-11 DIAGNOSIS — I10 Essential (primary) hypertension: Secondary | ICD-10-CM | POA: Diagnosis not present

## 2022-12-11 DIAGNOSIS — G629 Polyneuropathy, unspecified: Secondary | ICD-10-CM | POA: Diagnosis not present

## 2022-12-11 DIAGNOSIS — M25561 Pain in right knee: Secondary | ICD-10-CM | POA: Diagnosis not present

## 2022-12-18 DIAGNOSIS — I7300 Raynaud's syndrome without gangrene: Secondary | ICD-10-CM | POA: Diagnosis not present

## 2022-12-18 DIAGNOSIS — M1991 Primary osteoarthritis, unspecified site: Secondary | ICD-10-CM | POA: Diagnosis not present

## 2022-12-18 DIAGNOSIS — M25561 Pain in right knee: Secondary | ICD-10-CM | POA: Diagnosis not present

## 2022-12-18 DIAGNOSIS — Z6820 Body mass index (BMI) 20.0-20.9, adult: Secondary | ICD-10-CM | POA: Diagnosis not present

## 2022-12-18 DIAGNOSIS — M3501 Sicca syndrome with keratoconjunctivitis: Secondary | ICD-10-CM | POA: Diagnosis not present

## 2022-12-20 DIAGNOSIS — J3089 Other allergic rhinitis: Secondary | ICD-10-CM | POA: Diagnosis not present

## 2022-12-20 DIAGNOSIS — J3081 Allergic rhinitis due to animal (cat) (dog) hair and dander: Secondary | ICD-10-CM | POA: Diagnosis not present

## 2022-12-20 DIAGNOSIS — J301 Allergic rhinitis due to pollen: Secondary | ICD-10-CM | POA: Diagnosis not present

## 2023-01-02 DIAGNOSIS — J3089 Other allergic rhinitis: Secondary | ICD-10-CM | POA: Diagnosis not present

## 2023-01-02 DIAGNOSIS — J3081 Allergic rhinitis due to animal (cat) (dog) hair and dander: Secondary | ICD-10-CM | POA: Diagnosis not present

## 2023-01-02 DIAGNOSIS — J301 Allergic rhinitis due to pollen: Secondary | ICD-10-CM | POA: Diagnosis not present

## 2023-01-08 DIAGNOSIS — F1021 Alcohol dependence, in remission: Secondary | ICD-10-CM | POA: Diagnosis not present

## 2023-01-08 DIAGNOSIS — F900 Attention-deficit hyperactivity disorder, predominantly inattentive type: Secondary | ICD-10-CM | POA: Diagnosis not present

## 2023-01-08 DIAGNOSIS — F330 Major depressive disorder, recurrent, mild: Secondary | ICD-10-CM | POA: Diagnosis not present

## 2023-01-09 DIAGNOSIS — Z461 Encounter for fitting and adjustment of hearing aid: Secondary | ICD-10-CM | POA: Diagnosis not present

## 2023-01-16 DIAGNOSIS — F1021 Alcohol dependence, in remission: Secondary | ICD-10-CM | POA: Diagnosis not present

## 2023-01-16 DIAGNOSIS — F900 Attention-deficit hyperactivity disorder, predominantly inattentive type: Secondary | ICD-10-CM | POA: Diagnosis not present

## 2023-01-16 DIAGNOSIS — R69 Illness, unspecified: Secondary | ICD-10-CM | POA: Diagnosis not present

## 2023-01-16 DIAGNOSIS — F411 Generalized anxiety disorder: Secondary | ICD-10-CM | POA: Diagnosis not present

## 2023-01-16 DIAGNOSIS — J3089 Other allergic rhinitis: Secondary | ICD-10-CM | POA: Diagnosis not present

## 2023-01-22 ENCOUNTER — Ambulatory Visit: Payer: Medicare HMO | Admitting: Podiatry

## 2023-01-22 ENCOUNTER — Ambulatory Visit
Admission: RE | Admit: 2023-01-22 | Discharge: 2023-01-22 | Disposition: A | Discharge status: Home / Self Care | Payer: Medicare HMO | Source: Ambulatory Visit | Attending: Endocrinology | Admitting: Endocrinology

## 2023-01-22 DIAGNOSIS — E049 Nontoxic goiter, unspecified: Secondary | ICD-10-CM

## 2023-01-22 DIAGNOSIS — B351 Tinea unguium: Secondary | ICD-10-CM | POA: Diagnosis not present

## 2023-01-22 DIAGNOSIS — L600 Ingrowing nail: Secondary | ICD-10-CM | POA: Diagnosis not present

## 2023-01-22 DIAGNOSIS — E041 Nontoxic single thyroid nodule: Secondary | ICD-10-CM | POA: Diagnosis not present

## 2023-01-22 NOTE — Progress Notes (Signed)
Subjective: Chief Complaint  Patient presents with   Nail Problem     72 year old female presents the office today for follow-up evaluation of her right big toenail dystrophy, ingrown toenail on both big toes.  She says on the left side looks open nail splits and is getting ingrown in the corner.  No drainage or pus.  No other concerns.  Objective: AAO x3, NAD DP/PT pulses palpable bilaterally, CRT less than 3 seconds Incurvation present to the bilateral hallux toenails right hallux nail is growing in much better and has more clear color.  Prior to debridement, bilateral nails do have some white discoloration but this is improved after debridement, filing.  There is no drainage or pus.  She gets some occasional discomfort at the distal aspect of the toenail on the ingrown portion but no other areas of discomfort.  No pain with calf compression, swelling, warmth, erythema  Assessment: Onychomycosis, onychodystrophy  Plan: -All treatment options discussed with the patient including all alternatives, risks, complications.  -Debrided bilateral hallux nails with complications of bleeding.  The nails appear to be growing out however this will continue to improve.  Previously discussed ingrown toenail removal, partial nail avulsion but she wants to hold off on this. No signs of infection today.  Continue to monitor.  Monitor for any signs or symptoms of infection.  Vivi Barrack DPM

## 2023-01-23 ENCOUNTER — Ambulatory Visit: Payer: PPO | Admitting: Podiatry

## 2023-01-24 DIAGNOSIS — H0102A Squamous blepharitis right eye, upper and lower eyelids: Secondary | ICD-10-CM | POA: Diagnosis not present

## 2023-01-24 DIAGNOSIS — H16223 Keratoconjunctivitis sicca, not specified as Sjogren's, bilateral: Secondary | ICD-10-CM | POA: Diagnosis not present

## 2023-01-24 DIAGNOSIS — H0102B Squamous blepharitis left eye, upper and lower eyelids: Secondary | ICD-10-CM | POA: Diagnosis not present

## 2023-01-30 DIAGNOSIS — J301 Allergic rhinitis due to pollen: Secondary | ICD-10-CM | POA: Diagnosis not present

## 2023-01-30 DIAGNOSIS — J3089 Other allergic rhinitis: Secondary | ICD-10-CM | POA: Diagnosis not present

## 2023-01-30 DIAGNOSIS — J3081 Allergic rhinitis due to animal (cat) (dog) hair and dander: Secondary | ICD-10-CM | POA: Diagnosis not present

## 2023-02-01 DIAGNOSIS — E7800 Pure hypercholesterolemia, unspecified: Secondary | ICD-10-CM | POA: Diagnosis not present

## 2023-02-01 DIAGNOSIS — R7301 Impaired fasting glucose: Secondary | ICD-10-CM | POA: Diagnosis not present

## 2023-02-01 DIAGNOSIS — E049 Nontoxic goiter, unspecified: Secondary | ICD-10-CM | POA: Diagnosis not present

## 2023-02-01 DIAGNOSIS — E039 Hypothyroidism, unspecified: Secondary | ICD-10-CM | POA: Diagnosis not present

## 2023-02-08 DIAGNOSIS — E039 Hypothyroidism, unspecified: Secondary | ICD-10-CM | POA: Diagnosis not present

## 2023-02-08 DIAGNOSIS — E049 Nontoxic goiter, unspecified: Secondary | ICD-10-CM | POA: Diagnosis not present

## 2023-02-08 DIAGNOSIS — E7800 Pure hypercholesterolemia, unspecified: Secondary | ICD-10-CM | POA: Diagnosis not present

## 2023-02-08 DIAGNOSIS — I10 Essential (primary) hypertension: Secondary | ICD-10-CM | POA: Diagnosis not present

## 2023-02-08 DIAGNOSIS — R7301 Impaired fasting glucose: Secondary | ICD-10-CM | POA: Diagnosis not present

## 2023-02-12 DIAGNOSIS — F900 Attention-deficit hyperactivity disorder, predominantly inattentive type: Secondary | ICD-10-CM | POA: Diagnosis not present

## 2023-02-12 DIAGNOSIS — F411 Generalized anxiety disorder: Secondary | ICD-10-CM | POA: Diagnosis not present

## 2023-02-12 DIAGNOSIS — F1021 Alcohol dependence, in remission: Secondary | ICD-10-CM | POA: Diagnosis not present

## 2023-02-15 DIAGNOSIS — H903 Sensorineural hearing loss, bilateral: Secondary | ICD-10-CM | POA: Diagnosis not present

## 2023-02-21 DIAGNOSIS — J3089 Other allergic rhinitis: Secondary | ICD-10-CM | POA: Diagnosis not present

## 2023-03-07 DIAGNOSIS — J3089 Other allergic rhinitis: Secondary | ICD-10-CM | POA: Diagnosis not present

## 2023-03-19 ENCOUNTER — Other Ambulatory Visit (HOSPITAL_BASED_OUTPATIENT_CLINIC_OR_DEPARTMENT_OTHER): Payer: Self-pay | Admitting: Family

## 2023-03-19 DIAGNOSIS — I10 Essential (primary) hypertension: Secondary | ICD-10-CM

## 2023-03-20 DIAGNOSIS — J301 Allergic rhinitis due to pollen: Secondary | ICD-10-CM | POA: Diagnosis not present

## 2023-03-20 DIAGNOSIS — J3081 Allergic rhinitis due to animal (cat) (dog) hair and dander: Secondary | ICD-10-CM | POA: Diagnosis not present

## 2023-03-20 DIAGNOSIS — J3089 Other allergic rhinitis: Secondary | ICD-10-CM | POA: Diagnosis not present

## 2023-03-27 ENCOUNTER — Ambulatory Visit: Payer: Medicare HMO | Admitting: Podiatry

## 2023-03-27 ENCOUNTER — Ambulatory Visit: Payer: PPO | Admitting: Podiatry

## 2023-03-27 DIAGNOSIS — L600 Ingrowing nail: Secondary | ICD-10-CM

## 2023-03-27 NOTE — Progress Notes (Signed)
       Subjective:  Patient ID: Brianna Gates, female    DOB: [birth year],  MRN: [id number]   Brianna Gates presents to clinic today for:  Chief Complaint  Patient presents with   Nail Problem    Rm 13 RFC bilateral nail trim.   . Patient notes her great toenails need to be cut back in the corners.  She typically sees Dr. Ardelle Anton for care.  She notes that he is aware that she does not want to have a PNA performed of the hallux nail borders.  She would like to have the sides cut back to alleviate the discomfort of recurring ingrown toenails before she has a scheduled pedicure tomorrow.  She does have gel nail polish on today.  PCP is Avva, Ravisankar, MD.  Allergies  Allergen Reactions   Buspar [Buspirone] Itching    Vivid dreams, diarrhea    Captopril Other (See Comments)    reaction   Oxaprozin Hives and Other (See Comments)   Ramipril Cough and Other (See Comments)   Zoloft [Sertraline Hcl] Diarrhea   Ace Inhibitors     Other reaction(s): Other (See Comments) GB syndrome   Doxycycline Other (See Comments)    Burning sensation/Rash   Influenza Vac Split Quad     Other Reaction(s): Guillain-Barr syndrome   Influenza Vaccines     HX    Sulfa Antibiotics Other (See Comments)    unknown   Amlodipine Besylate Palpitations    Review of Systems: Negative except as noted in the HPI.  Objective:  There were no vitals filed for this visit.  Brianna Gates is a pleasant 72 y.o. female in NAD. AAO x 3.  Vascular Examination: Capillary refill time is 3-5 seconds to toes bilateral. Palpable pedal pulses b/l LE. Digital hair present b/l.  Mild ankle edema b/l. Skin temperature gradient WNL b/l. No varicosities b/l. No cyanosis or clubbing noted b/l.   Dermatological Examination: Pedal skin with normal turgor, texture and tone b/l. No open wounds. No interdigital macerations b/l.  The hallux nails are 3 mm thick with incurvated nail borders.  Unable to ascertain the  coloration of the nail due to the polish in place.  No signs of paronychia.  No active drainage.  Neurological Examination: Protective sensation intact bilateral LE.   Assessment/Plan: 1. Ingrown toenail     Softening solution was placed on the hallux nails for several minutes.  Utilizing sterile nail nippers, the hallux distal medial and lateral nail borders were cut back to alleviate any discomfort.  Patient noted immediate improvement of symptoms after this was performed.  Neosporin applied.  She will follow-up with Dr. Ardelle Anton as scheduled  Return in about 9 weeks (around 05/29/2023) for hallux nail debridement & cut back corners (Dr. Ardelle Anton).   Clerance Lav, DPM, FACFAS [facility]     [address] [place], [place] [address]                Office [phone]  Fax [phone]

## 2023-03-29 DIAGNOSIS — J301 Allergic rhinitis due to pollen: Secondary | ICD-10-CM | POA: Diagnosis not present

## 2023-03-29 DIAGNOSIS — J3089 Other allergic rhinitis: Secondary | ICD-10-CM | POA: Diagnosis not present

## 2023-03-29 DIAGNOSIS — J3081 Allergic rhinitis due to animal (cat) (dog) hair and dander: Secondary | ICD-10-CM | POA: Diagnosis not present

## 2023-03-30 DIAGNOSIS — F900 Attention-deficit hyperactivity disorder, predominantly inattentive type: Secondary | ICD-10-CM | POA: Diagnosis not present

## 2023-03-30 DIAGNOSIS — F411 Generalized anxiety disorder: Secondary | ICD-10-CM | POA: Diagnosis not present

## 2023-04-09 DIAGNOSIS — J301 Allergic rhinitis due to pollen: Secondary | ICD-10-CM | POA: Diagnosis not present

## 2023-04-09 DIAGNOSIS — J3081 Allergic rhinitis due to animal (cat) (dog) hair and dander: Secondary | ICD-10-CM | POA: Diagnosis not present

## 2023-04-09 DIAGNOSIS — J3089 Other allergic rhinitis: Secondary | ICD-10-CM | POA: Diagnosis not present

## 2023-04-24 DIAGNOSIS — J3081 Allergic rhinitis due to animal (cat) (dog) hair and dander: Secondary | ICD-10-CM | POA: Diagnosis not present

## 2023-04-24 DIAGNOSIS — J3089 Other allergic rhinitis: Secondary | ICD-10-CM | POA: Diagnosis not present

## 2023-04-24 DIAGNOSIS — J301 Allergic rhinitis due to pollen: Secondary | ICD-10-CM | POA: Diagnosis not present

## 2023-04-30 DIAGNOSIS — Z6821 Body mass index (BMI) 21.0-21.9, adult: Secondary | ICD-10-CM | POA: Diagnosis not present

## 2023-04-30 DIAGNOSIS — Z01419 Encounter for gynecological examination (general) (routine) without abnormal findings: Secondary | ICD-10-CM | POA: Diagnosis not present

## 2023-04-30 DIAGNOSIS — R319 Hematuria, unspecified: Secondary | ICD-10-CM | POA: Diagnosis not present

## 2023-04-30 DIAGNOSIS — Z1231 Encounter for screening mammogram for malignant neoplasm of breast: Secondary | ICD-10-CM | POA: Diagnosis not present

## 2023-05-03 DIAGNOSIS — J301 Allergic rhinitis due to pollen: Secondary | ICD-10-CM | POA: Diagnosis not present

## 2023-05-03 DIAGNOSIS — J3081 Allergic rhinitis due to animal (cat) (dog) hair and dander: Secondary | ICD-10-CM | POA: Diagnosis not present

## 2023-05-03 DIAGNOSIS — J3089 Other allergic rhinitis: Secondary | ICD-10-CM | POA: Diagnosis not present

## 2023-05-03 DIAGNOSIS — R062 Wheezing: Secondary | ICD-10-CM | POA: Diagnosis not present

## 2023-05-08 ENCOUNTER — Ambulatory Visit: Payer: Medicare HMO | Admitting: Neurology

## 2023-05-08 ENCOUNTER — Encounter: Payer: Self-pay | Admitting: Neurology

## 2023-05-08 VITALS — BP 120/65 | HR 74 | Ht 64.0 in | Wt 122.5 lb

## 2023-05-08 DIAGNOSIS — G610 Guillain-Barre syndrome: Secondary | ICD-10-CM

## 2023-05-08 DIAGNOSIS — R202 Paresthesia of skin: Secondary | ICD-10-CM | POA: Diagnosis not present

## 2023-05-08 MED ORDER — GABAPENTIN 400 MG PO CAPS: 400.0000 mg | ORAL_CAPSULE | Freq: Three times a day (TID) | ORAL | 5 refills | Status: DC

## 2023-05-08 NOTE — Progress Notes (Addendum)
PATIENT: Brianna Gates DOB: Nov 29, 1950  REASON FOR VISIT: Follow up for GBS HISTORY FROM: Patient PRIMARY NEUROLOGIST: Dr. Terrace Arabia since Dr. 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  She remains very active, committed to her exercise regimen.  She would like to see Dr. Terrace Arabia in 6 months to establish care.  Addendum 08/29/2023 SS: I received office note from PCP Dr. Felipa Eth labs showed creatinine 0.71, AST 27, ALT 16,, Hgb 13.9, platelet 192, TSH 0.389,  HISTORY OF PRESENT ILLNESS: Today 05/08/23 Feels some paresthesia to her feet is going higher to ankle level. Right hand getting weaker, harder to open cans, fatigues with use. Does water aerobics, group fitness. Takes gabapentin 300 mg TID, would like to try more, notes more tingling in her extremities that is bothersome. At night her feet burn, but she is cold, can be bright red. She worries about increased paresthesia, wants to be active with her grandkids and keep up with friends. Remains committed to her sobriety.   Update  10/18/22 SS: wants to discuss getting back on gabapentin. Tried Cymbalta. When on gabapentin before when drinking alcohol, made her feel drunk. Tried higher dose Cymbalta didn't help. She does go to [employer]. Is no longer drinking. Still has numbness to feet, tingling all day. Worse on hardwood floors. Doesn't keep her up at night, has never been a good sleeper. Feels sand between her toes. Walking is better with exercise, had right knee replacement, has scar tissue, is now stiff. Has stopped drink etoh since summer. Seeing psychiatry, for drinking mostly.   Update 10/06/21 SS: Ms. Taussig here today for follow-up with history of  Guillain Barr syndrome.  Is on Cymbalta for uncomfortable sensory alterations in the feet. Gabapentin made her feel drowsy. Taking Cymbalta 60 mg daily. Mostly feels like sand in between toes, ball of foot. Can't walk barefoot. No falls. The sensation isn't painful. Her right hand can tingle with overuse. Wants to stay on Cymbalta for now. Is going to see a therapist to see about ADD. If they put her on a mood medication, she may go back to gabapentin. She does water aerobics. Here alone.   HISTORY  04/07/2021 Dr. Anne Hahn: Brianna Gates is a 72 year old right-handed white female with a history of Guillain-Barre syndrome in the past.  The patient has had some residual numbness and tingling in the feet, she has some mild balance issues but she has not had any falls.  She recently had a redo of a knee replacement on the right, she has done well with recovery.  She recently had a COVID infection in January 2022.  The patient has recovered from this.  She has been on gabapentin for some of the discomfort in the feet, but she has had a lot of daytime drowsiness and some gait instability in the evening when she took the medication.  She has stopped the medication at this point.  She is on Cymbalta taking 20 mg daily but she does not believe this helps her symptoms.  REVIEW OF SYSTEMS: Out of a complete 14 system review of symptoms, the patient complains only of the following symptoms, and all other reviewed systems are negative.  See HPI  ALLERGIES: Allergies  Allergen Reactions  Buspar [Buspirone] Itching    Vivid dreams, diarrhea    Captopril Other (See Comments)    reaction   Oxaprozin Hives and Other (See Comments)   Ramipril Cough and Other (See Comments)   Zoloft [Sertraline Hcl] Diarrhea   Ace Inhibitors     Other reaction(s): Other (See Comments) GB syndrome   Doxycycline Other (See Comments)    Burning sensation/Rash   Influenza Vac Split Quad     Other Reaction(s): Guillain-Barr syndrome    Influenza Vaccines     HX    Sulfa Antibiotics Other (See Comments)    unknown   Amlodipine Besylate Palpitations    HOME MEDICATIONS: Outpatient Medications Prior to Visit  Medication Sig Dispense Refill   acetaminophen (TYLENOL) 650 MG CR tablet Take 1,300 mg by mouth every 8 (eight) hours as needed for pain or fever.     albuterol (PROVENTIL HFA;VENTOLIN HFA) 108 (90 BASE) MCG/ACT inhaler Inhale 2 puffs into the lungs every 6 (six) hours as needed for wheezing or shortness of breath.     amoxicillin (AMOXIL) 500 MG capsule Take 2,000 mg by mouth once as needed (prior to dental appointments).     Amphetamine ER (DYANAVEL XR) 20 MG CHER Take 1 tablet by mouth daily after breakfast.     Ascorbic Acid (VITAMIN C PO) Take 1 tablet by mouth daily as needed (sick symptoms).     azelastine (ASTELIN) 0.1 % nasal spray Place 1 spray into both nostrils 2 (two) times daily as needed (cold symptoms).     busPIRone (BUSPAR) 10 MG tablet Take 10 mg by mouth 2 (two) times daily.     chlorthalidone (HYGROTON) 25 MG tablet Take 0.5 tablets (12.5 mg total) by mouth daily. 45 tablet 3   Cholecalciferol (VITAMIN D) 2000 UNITS CAPS Take 2,000 Units by mouth daily in the afternoon.     COVID-19 mRNA vaccine 2023-2024 (COMIRNATY) SUSP injection Inject into the muscle. 0.3 mL 0   Emollient (CETAPHIL) cream Apply 1 application topically daily.     EPINEPHrine 0.3 mg/0.3 mL IJ SOAJ injection Inject 0.3 mg into the muscle once as needed for anaphylaxis.     fexofenadine-pseudoephedrine (ALLEGRA-D 24) 180-240 MG 24 hr tablet Take 1 tablet by mouth daily.     gabapentin (NEURONTIN) 300 MG capsule Take 1 capsule (300 mg total) by mouth 3 (three) times daily. 90 capsule 5   guaiFENesin (MUCINEX) 600 MG 12 hr tablet Take 1,200 mg by mouth daily as needed.     ibuprofen (ADVIL,MOTRIN) 100 MG tablet Take 400 mg by mouth daily at 12 noon. May take in the pm additionally as needed     ketoconazole (NIZORAL) 2 % cream  Apply 1 application topically daily. (Patient taking differently: Apply 1 application  topically daily as needed.) 60 g 0   levothyroxine (SYNTHROID) 50 MCG tablet Take 50 mcg by mouth daily before breakfast.      loratadine (CLARITIN) 10 MG tablet Take 10 mg by mouth daily.     losartan (COZAAR) 50 MG tablet TAKE ONE TABLET TWICE DAILY 180 tablet 1   montelukast (SINGULAIR) 10 MG tablet Take 10 mg by mouth at bedtime.     naltrexone (DEPADE) 50 MG tablet TAKE ONE TABLET IN THE MORNING     pantoprazole (PROTONIX) 40 MG tablet Take 1 tablet (40 mg total) by mouth 2 (two) times daily. Office visit for further refills 180 tablet 0   PARoxetine (PAXIL) 40 MG tablet Take by mouth at bedtime.  Perfluorohexyloctane (MIEBO) 1.338 GM/ML SOLN Apply 3 drops to eye daily.     rosuvastatin (CRESTOR) 5 MG tablet Take 5 mg by mouth every other day.     Simethicone (PHAZYME PO) Take 250 mg by mouth daily as needed (Gas).     triamcinolone cream (KENALOG) 0.1 % 1 application as needed (itching bumps on stomach). Mixed with Cetaphil     meloxicam (MOBIC) 15 MG tablet Take 15 mg by mouth daily.     methylphenidate (DAYTRANA) 20 MG/9HR Place 1 patch onto the skin every morning.     rosuvastatin (CRESTOR) 5 MG tablet Take 1 tablet (5 mg total) by mouth every other day. 90 tablet 1   Varenicline Tartrate 0.03 MG/ACT SOLN Place 1 spray into the nose 2 (two) times daily.     No facility-administered medications prior to visit.    PAST MEDICAL HISTORY: Past Medical History:  Diagnosis Date   Allergy    Anxiety    Asthma    INFREQUENT PROBLEM - rarely used inhaler   Cervical spondylosis    Cervical strain 07/22/2014   Chest pain of uncertain etiology 12/13/2021   Complication of anesthesia    UNKNOWN REACTION WITH RASH AT SITE OF IV only with knee surgery   Degenerative arthritis    Dyslipidemia    Eczema    GERD (gastroesophageal reflux disease)    Guillain-Barre syndrome (HCC)    History of  Guillain-Barre syndrome 07/22/2014   Hyperlipidemia    Hypertension    Hypothyroidism    Lumbosacral spondylosis    Palpitations 12/12/2021   Pure hypercholesterolemia 12/13/2021   Raynaud disease    Thyroid nodule     PAST SURGICAL HISTORY: Past Surgical History:  Procedure Laterality Date   ABDOMINAL HYSTERECTOMY     COLONOSCOPY     ESOPHAGOGASTRODUODENOSCOPY (EGD) WITH PROPOFOL N/A 09/13/2021   Procedure: ESOPHAGOGASTRODUODENOSCOPY (EGD) WITH PROPOFOL;  Surgeon: Hilarie Fredrickson, MD;  Location: [facility];  Service: Endoscopy;  Laterality: N/A;   FOOT SURGERY Left    GANGLION CYST EXCISION Left    Wrist   HERNIA REPAIR     KNEE ARTHROSCOPY     RT KNEE   KNEE ARTHROTOMY Right 08/27/2013   Procedure: RIGHT KNEE SCAR EXCISION AND FEMORAL REVISION;  Surgeon: Loanne Drilling, MD;  Location: [facility];  Service: Orthopedics;  Laterality: Right;   REPLACEMENT TOTAL KNEE Right    2014, 2021   REPLACEMENT TOTAL KNEE BILATERAL Bilateral    STRABISMUS SURGERY Right    TONSILLECTOMY     vaginal vulvo prolapse  09/16/2015    FAMILY HISTORY: Family History  Problem Relation Age of Onset   Hypertension Mother    Other Mother        Dyslipidemia   Stroke Mother    Heart attack Father        41s   Heart disease Father    Hemochromatosis Father    Hemochromatosis Brother    Hyperlipidemia Brother    Hypertension Brother    Other Brother        Dyslipidemia   Stomach cancer Paternal Uncle    Colon cancer Neg Hx    Rectal cancer Neg Hx    Esophageal cancer Neg Hx     SOCIAL HISTORY: Social History   Socioeconomic History   Marital status: Married    Spouse name: Not on file   Number of children: 2   Years of education: college   Highest education level: Not on file  Occupational History    Employer: UNEMPLOYED  Tobacco Use   Smoking status: Never   Smokeless tobacco: Never  Vaping Use   Vaping status: Never Used  Substance and Sexual Activity   Alcohol use: Not  Currently    Comment: stopped drinking   Drug use: No   Sexual activity: Yes    Birth control/protection: Other-see comments, Post-menopausal    Comment: Hysterectomy  Other Topics Concern   Not on file  Social History Narrative   Lives at home, married   Patient is right handed.   Patient drinks 2-3 cups caffeine daily.   Social Determinants of Health   Financial Resource Strain: Not on file  Food Insecurity: Not on file  Transportation Needs: Not on file  Physical Activity: Not on file  Stress: Not on file  Social Connections: Not on file  Intimate Partner Violence: Not on file   PHYSICAL EXAM  Vitals:   05/08/23 1347  BP: 120/65  Pulse: 74  Weight: 122 lb 8 oz (55.6 kg)  Height: 5\' 4"  (1.626 m)    Body mass index is 21.03 kg/m.  Generalized: Well developed, in no acute distress  Neurological examination  Mentation: Alert oriented to time, place, history taking. Follows all commands speech and language fluent Cranial nerve II-XII: Pupils were equal round reactive to light. Extraocular movements were full, visual field were full on confrontational test. Facial sensation and strength were normal. Head turning and shoulder shrug were normal and symmetric. Motor: The motor testing reveals 5 over 5 strength of all 4 extremities. Good symmetric motor tone is noted throughout.  Atrophy to left lower leg.  Sensory: Decreased soft touch sensation, pinprick to ankle level bilaterally Coordination: Cerebellar testing reveals good finger-nose-finger and heel-to-shin bilaterally.  Gait and station: Gait is normal, steady, with walking barefoot, her toes tend to grip the ground,harder to walk on tip toe, tandem gait is steady forward and back Reflexes: Deep tendon reflexes are symmetric and normal  DIAGNOSTIC DATA (LABS, IMAGING, TESTING) - I reviewed patient records, labs, notes, testing and imaging myself where available.  Lab Results  Component Value Date   WBC 5.9  05/12/2022   HGB 14.4 05/12/2022   HCT 42.8 05/12/2022   MCV 98.8 05/12/2022   PLT 233 05/12/2022      Component Value Date/Time   NA 146 (H) 09/14/2022 0934   K 4.5 09/14/2022 0934   CL 104 09/14/2022 0934   CO2 27 09/14/2022 0934   GLUCOSE 91 09/14/2022 0934   GLUCOSE 100 (H) 10/30/2021 0808   BUN 18 09/14/2022 0934   CREATININE 0.91 09/14/2022 0934   CALCIUM 9.8 09/14/2022 0934   PROT 6.3 09/14/2022 0934   ALBUMIN 4.7 09/14/2022 0934   AST 24 09/14/2022 0934   ALT 20 09/14/2022 0934   ALKPHOS 79 09/14/2022 0934   BILITOT 0.4 09/14/2022 0934   GFRNONAA >60 10/30/2021 0808   GFRAA >90 08/29/2013 0405   Lab Results  Component Value Date   CHOL 156 09/14/2022   HDL 62 09/14/2022   LDLCALC 80 09/14/2022   TRIG 70 09/14/2022   CHOLHDL 2.5 09/14/2022   No results found for: "HGBA1C" No results found for: "VITAMINB12" Lab Results  Component Value Date   TSH 1.068 12/27/2020   Margie Ege, AGNP-C, DNP 05/08/2023, 1:53 PM [facility] [address] [place], [place] [address] [phone]

## 2023-05-08 NOTE — Patient Instructions (Signed)
Try gabapentin higher dose 400 mg three times daily  Follow up in 6 months, may consider nerve conduction if needed

## 2023-05-09 DIAGNOSIS — F900 Attention-deficit hyperactivity disorder, predominantly inattentive type: Secondary | ICD-10-CM | POA: Diagnosis not present

## 2023-05-09 DIAGNOSIS — F411 Generalized anxiety disorder: Secondary | ICD-10-CM | POA: Diagnosis not present

## 2023-05-11 DIAGNOSIS — J301 Allergic rhinitis due to pollen: Secondary | ICD-10-CM | POA: Diagnosis not present

## 2023-05-11 DIAGNOSIS — J3089 Other allergic rhinitis: Secondary | ICD-10-CM | POA: Diagnosis not present

## 2023-05-11 DIAGNOSIS — J3081 Allergic rhinitis due to animal (cat) (dog) hair and dander: Secondary | ICD-10-CM | POA: Diagnosis not present

## 2023-05-23 DIAGNOSIS — J301 Allergic rhinitis due to pollen: Secondary | ICD-10-CM | POA: Diagnosis not present

## 2023-05-23 DIAGNOSIS — J3081 Allergic rhinitis due to animal (cat) (dog) hair and dander: Secondary | ICD-10-CM | POA: Diagnosis not present

## 2023-05-23 DIAGNOSIS — J3089 Other allergic rhinitis: Secondary | ICD-10-CM | POA: Diagnosis not present

## 2023-06-04 DIAGNOSIS — H04123 Dry eye syndrome of bilateral lacrimal glands: Secondary | ICD-10-CM | POA: Diagnosis not present

## 2023-06-05 DIAGNOSIS — J3089 Other allergic rhinitis: Secondary | ICD-10-CM | POA: Diagnosis not present

## 2023-06-05 DIAGNOSIS — J3081 Allergic rhinitis due to animal (cat) (dog) hair and dander: Secondary | ICD-10-CM | POA: Diagnosis not present

## 2023-06-05 DIAGNOSIS — J301 Allergic rhinitis due to pollen: Secondary | ICD-10-CM | POA: Diagnosis not present

## 2023-06-11 ENCOUNTER — Ambulatory Visit: Payer: Medicare HMO | Admitting: Podiatry

## 2023-06-11 DIAGNOSIS — L600 Ingrowing nail: Secondary | ICD-10-CM

## 2023-06-13 ENCOUNTER — Telehealth (HOSPITAL_BASED_OUTPATIENT_CLINIC_OR_DEPARTMENT_OTHER): Payer: Self-pay | Admitting: Cardiovascular Disease

## 2023-06-13 ENCOUNTER — Other Ambulatory Visit (HOSPITAL_BASED_OUTPATIENT_CLINIC_OR_DEPARTMENT_OTHER): Payer: Self-pay | Admitting: Family

## 2023-06-13 DIAGNOSIS — L218 Other seborrheic dermatitis: Secondary | ICD-10-CM | POA: Diagnosis not present

## 2023-06-13 DIAGNOSIS — L821 Other seborrheic keratosis: Secondary | ICD-10-CM | POA: Diagnosis not present

## 2023-06-13 DIAGNOSIS — L570 Actinic keratosis: Secondary | ICD-10-CM | POA: Diagnosis not present

## 2023-06-13 NOTE — Telephone Encounter (Signed)
   Pt is returning call, she gave her most recent BP that she can remember 118/55, 122/58, 130/53 and HR is between 59-64. She is taking her losartan 1 tablet a day instead of twice a day but her other meds she is taking it as prescribed

## 2023-06-13 NOTE — Telephone Encounter (Signed)
Pt c/o BP issue: STAT if pt c/o blurred vision, one-sided weakness or slurred speech  1. What are your last 5 BP readings? Diastolic in 50's for the past several months off and on   2. Are you having any other symptoms (ex. Dizziness, headache, blurred vision, passed out)? dizziness & heart flutters   3. What is your BP issue? Hypotension causing her to wonder if med changes needed to be made.     Reports she [place] out [place] the airport, but it was due to seeing her own blood from a cut.

## 2023-06-13 NOTE — Telephone Encounter (Signed)
Rx request sent to pharmacy.  

## 2023-06-13 NOTE — Telephone Encounter (Signed)
Returned call to patient, no answer, left message to call back!  

## 2023-06-13 NOTE — Telephone Encounter (Signed)
Left message to call back  

## 2023-06-14 NOTE — Progress Notes (Signed)
Subjective: Chief Complaint  Patient presents with   Nail Problem    Cut back corners on both hallux toes      72 year old female presents the office today for follow-up evaluation of her right big toenail dystrophy, ingrown toenail on both big toes.  States the nail borders should get sore again.  Denies any swelling redness or drainage.  No open lesions.  Objective: AAO x3, NAD DP/PT pulses palpable bilaterally, CRT less than 3 seconds Ingrown toenails noted along the distal aspect of bilateral hallux toenails on both medial, lateral nail borders.  There is no drainage or pus and there is no edema, erythema or signs of infection.  There are no open lesions noted today. No pain with calf compression, swelling, warmth, erythema  Assessment: Onychomycosis, onychodystrophy  Plan: -All treatment options discussed with the patient including all alternatives, risks, complications.  -Overall the nail color is improved.  I did sharply debride the corners of the ingrown toenail without any complications or bleeding today.  Wants to hold off on partial nail avulsion but symptoms worsen we will reevaluate.  Vivi Barrack DPM

## 2023-06-14 NOTE — Telephone Encounter (Signed)
Please advise 

## 2023-06-19 DIAGNOSIS — J3089 Other allergic rhinitis: Secondary | ICD-10-CM | POA: Diagnosis not present

## 2023-06-19 DIAGNOSIS — J301 Allergic rhinitis due to pollen: Secondary | ICD-10-CM | POA: Diagnosis not present

## 2023-06-19 DIAGNOSIS — J3081 Allergic rhinitis due to animal (cat) (dog) hair and dander: Secondary | ICD-10-CM | POA: Diagnosis not present

## 2023-06-19 NOTE — Telephone Encounter (Signed)
Returned call to patient, no answer, left message to call back. Info from pharmD sent in mychart.

## 2023-06-19 NOTE — Telephone Encounter (Signed)
Her systolic BP looks great, diastolic is mildly low. Do not see that she's brought her BP cuff into the office before to confirm its accuracy. She hasn't been seen since last November either and is due for annual follow up. Would recommend that she keep a BP log and bring this in along with home readings, and any potential med adjustments could be made at that time.

## 2023-06-19 NOTE — Progress Notes (Signed)
Cardiology Office Note:    Date:  06/20/2023  ID:  Brianna Gates, DOB 1950-11-30, MRN [id number] PCP: Chilton Greathouse, MD  [facility] Providers Cardiologist:  Chilton Si, MD       Patient Profile:      Hypertension  Hyperlipidemia  ETOH abuse Hypothyroidism  Palpitations   TTE 12/28/20: EF 60-65, no RWMA, NL RVSF, RAP 3  Monitor 12/2021: NSR, 6 bts SVT, rare PVCs/PACs Hx of Guillain-Barre syndrome Raynaud's disease         History of Present Illness:  Discussed the use of AI scribe software for clinical note transcription with the patient, who gave verbal consent to proceed.    72 year old female who returns for follow-up and evaluation of hypotension.  She reports dizziness which has been occurring off and on for several months, but has not resulted in any fainting episodes. The patient also mentions experiencing heart palpitations daily, lasting for about a minute and a half at most. She has noticed these palpitations more frequently in the morning. In addition to these symptoms, the patient has experienced chest pressure during exertion, such as climbing hills or stairs. This has occurred a few times over the past few months. She also reports feeling short of breath during these episodes, but this is not a consistent symptom. The patient denies any associated jaw pain, arm pain, or nausea during these episodes. The patient has a strong family history of heart disease, with her father having suffered a heart attack in his late 8s. She is concerned about her own risk and has noticed some changes in her symptoms recently. She has been monitoring her blood pressure at home and has noticed some fluctuations, with readings sometimes dropping to as low as 105/55.     ROS:  See HPI No melena, hematochezia, hematuria    Studies Reviewed:   EKG Interpretation Date/Time:  Wednesday June 20 2023 11:16:46 EDT Ventricular Rate:  73 PR Interval:  168 QRS  Duration:  78 QT Interval:  368 QTC Calculation: 405 R Axis:   77  Text Interpretation: Normal sinus rhythm Normal axis Nonspecific ST abnormality No significant change since last tracing Confirmed by Tereso Newcomer [phone]) on 06/20/2023 11:21:08 AM    Risk Assessment/Calculations:             Physical Exam:   VS:  BP (!) 109/50   Pulse 73   Ht 5\' 4"  (1.626 m)   Wt 120 lb 9.6 oz (54.7 kg)   SpO2 96%   BMI 20.70 kg/m    Wt Readings from Last 3 Encounters:  06/20/23 120 lb 9.6 oz (54.7 kg)  05/08/23 122 lb 8 oz (55.6 kg)  10/18/22 123 lb 8 oz (56 kg)    Constitutional:      Appearance: Healthy appearance. Not in distress.  Neck:     Vascular: No carotid bruit (no L supraclavicular bruit) or JVR. JVD normal.  Pulmonary:     Breath sounds: Normal breath sounds. No wheezing. No rales.  Cardiovascular:     Normal rate. Regular rhythm.     Murmurs: There is no murmur.  Edema:    Peripheral edema absent.  Abdominal:     Palpations: Abdomen is soft.      Assessment and Plan:     Hypertension She has noted recent episodes of lightheadedness and had concerns of low blood pressure.  She had some diastolic readings in the 50s.  I checked her blood pressure machine today with our mercury  cuff.  On the right blood pressure was 130/70.  With her machine, 134/70.  On the left, 120/68.  On the left with her machine 124/66.  She had recently stopped taking losartan.  She started back on that last night. -Continue Losartan 50mg  twice daily and Chlorthalidone 12.5mg  daily. -Monitor blood pressure daily and report readings via MyChart in 2-3 weeks. -If she has blood pressures running in the low 100s, systolically, we can certainly consider either eliminating chlorthalidone or reducing losartan to 50 mg once daily.  Palpitations Daily episodes of palpitations, lasting up to a minute and a half. Monitor last year w/o significant findings. She did have en episode of syncope while on vacation  several months ago. It sounds vasovagal but will get monitor and echocardiogram to evaluate. -Zio XT monitor -Echocardiogram  -BMET, CBC, TSH  Exertional chest discomfort and shortness of breath She has noted a few episodes of chest pressure with exertion and shortness of breath in the past few months.  She has a strong family history of CAD with her father dying of a myocardial infarction in his 6s.  She has not had stress testing or coronary calcium score in the past. -Order coronary CTA to evaluate for coronary artery disease. -Order echocardiogram to evaluate for structural heart disease.  Hyperlipidemia On Rosuvastatin 5mg  daily. -Continue current management.         Dispo:  Return in about 8 weeks (around 08/15/2023) for Follow up after testing w/ Dr. Duke Salvia, or PA/NP.  Signed, Tereso Newcomer, PA-C

## 2023-06-19 NOTE — Telephone Encounter (Signed)
Patient returned call, transferred from call center. Reviewed recommendations with patient and she is agreeable to seeing Tereso Newcomer, PA tomorrow at the [address] office.

## 2023-06-20 ENCOUNTER — Ambulatory Visit: Payer: Medicare HMO | Attending: Physician Assistant | Admitting: Physician Assistant

## 2023-06-20 ENCOUNTER — Ambulatory Visit: Payer: Medicare HMO | Attending: Physician Assistant

## 2023-06-20 ENCOUNTER — Encounter: Payer: Self-pay | Admitting: Physician Assistant

## 2023-06-20 VITALS — BP 109/50 | HR 73 | Ht 64.0 in | Wt 120.6 lb

## 2023-06-20 DIAGNOSIS — R079 Chest pain, unspecified: Secondary | ICD-10-CM

## 2023-06-20 DIAGNOSIS — I10 Essential (primary) hypertension: Secondary | ICD-10-CM

## 2023-06-20 DIAGNOSIS — E782 Mixed hyperlipidemia: Secondary | ICD-10-CM

## 2023-06-20 DIAGNOSIS — R0602 Shortness of breath: Secondary | ICD-10-CM

## 2023-06-20 DIAGNOSIS — J3089 Other allergic rhinitis: Secondary | ICD-10-CM | POA: Diagnosis not present

## 2023-06-20 DIAGNOSIS — R55 Syncope and collapse: Secondary | ICD-10-CM | POA: Diagnosis not present

## 2023-06-20 DIAGNOSIS — R072 Precordial pain: Secondary | ICD-10-CM

## 2023-06-20 DIAGNOSIS — R002 Palpitations: Secondary | ICD-10-CM | POA: Diagnosis not present

## 2023-06-20 MED ORDER — METOPROLOL TARTRATE 100 MG PO TABS: ORAL_TABLET | ORAL | 0 refills | Status: DC

## 2023-06-20 NOTE — Progress Notes (Unsigned)
Enrolled for [employer] to mail a ZIO XT long term holter monitor to the patients address on file.   Dr. Oval Linsey to read.

## 2023-06-20 NOTE — Patient Instructions (Addendum)
Medication Instructions:  Your physician recommends that you continue on your current medications as directed. Please refer to the Current Medication list given to you today.  *If you need a refill on your cardiac medications before your next appointment, please call your pharmacy*   Lab Work: TODAY:  BMET, CBC, & TSH  If you have labs (blood work) drawn today and your tests are completely normal, you will receive your results only by: MyChart Message (if you have MyChart) OR A paper copy in the mail If you have any lab test that is abnormal or we need to change your treatment, we will call you to review the results.   Testing/Procedures: Your physician has requested that you have an echocardiogram. Echocardiography is a painless test that uses sound waves to create images of your heart. It provides your doctor with information about the size and shape of your heart and how well your heart's chambers and valves are working. This procedure takes approximately one hour. There are no restrictions for this procedure. Please do NOT wear cologne, perfume, aftershave, or lotions (deodorant is allowed). Please arrive 15 minutes prior to your appointment time.   Your physician has requested that you have cardiac CT. Cardiac computed tomography (CT) is a painless test that uses an x-ray machine to take clear, detailed pictures of your heart. For further information please visit [email or web address] Please follow instruction sheet BELOW:     Your cardiac CT will be scheduled at one of the below locations:   [facility] [address] [place], [place] [address] [phone]  OR  [facility] [address] [place], [place] [address] [phone]  OR   [facility] [address] [place], [place] [address] [phone]  If scheduled at [facility], please arrive at the [facility] and Children's  Entrance (Entrance C2) of [facility] 30 minutes prior to test start time. You can use the FREE valet parking offered at entrance C (encouraged to control the heart rate for the test)  Proceed to the [facility] Radiology Department (first floor) to check-in and test prep.  All radiology patients and guests should use entrance C2 at [facility], accessed from [facility], even though the hospital's physical address listed is [address].    If scheduled at [facility] or [facility], please arrive 15 mins early for check-in and test prep.  There is spacious parking and easy access to the radiology department from the [facility] Heart and Vascular entrance. Please enter here and check-in with the desk attendant.   Please follow these instructions carefully (unless otherwise directed):  An IV will be required for this test and Nitroglycerin will be given.  Hold all erectile dysfunction medications at least 3 days (72 hrs) prior to test. (Ie viagra, cialis, sildenafil, tadalafil, etc)   On the Night Before the Test: Be sure to Drink plenty of water. Do not consume any caffeinated/decaffeinated beverages or chocolate 12 hours prior to your test. Do not take any antihistamines 12 hours prior to your test.   On the Day of the Test: Drink plenty of water until 1 hour prior to the test. Do not eat any food 1 hour prior to test. You may take your regular medications prior to the test.  Take metoprolol (Lopressor) 100 MG two hours prior to test. IF YOU ARE NOTICING BLOOD PRESSURES LOWER THAN 110 ON TOP,  CALL us BEFORE THIS DAY, MAY NEED TO CHANGE THIS DOSE FEMALES- please wear underwire-free bra if available, avoid dresses & tight clothing        After the Test: Drink plenty of water. After receiving IV contrast, you may experience a mild flushed feeling. This is normal. On occasion, you may experience a mild rash  up to 24 hours after the test. This is not dangerous. If this occurs, you can take Benadryl 25 mg and increase your fluid intake. If you experience trouble breathing, this can be serious. If it is severe call 911 IMMEDIATELY. If it is mild, please call our office. If you take any of these medications: Glipizide/Metformin, Avandament, Glucavance, please do not take 48 hours after completing test unless otherwise instructed.  We will call to schedule your test 2-4 weeks out understanding that some insurance companies will need an authorization prior to the service being performed.   For more information and frequently asked questions, please visit our website : [email or web address]  For non-scheduling related questions, please contact the cardiac imaging nurse navigator should you have any questions/concerns: Cardiac Imaging Nurse Navigators Direct Office Dial: [phone]   For scheduling needs, including cancellations and rescheduling, please call [place], [phone].   ZIO XT- Long Term Monitor Instructions  Your physician has requested you wear a ZIO patch monitor for 14 days.  This is a single patch monitor. [employer] supplies one patch monitor per enrollment. Additional stickers are not available. Please do not apply patch if you will be having a Nuclear Stress Test,  Echocardiogram, Cardiac CT, MRI, or Chest Xray during the period you would be wearing the  monitor. The patch cannot be worn during these tests. You cannot remove and re-apply the  ZIO XT patch monitor.  Your ZIO patch monitor will be mailed 3 day USPS to your address on file. It may take 3-5 days  to receive your monitor after you have been enrolled.  Once you have received your monitor, please review the enclosed instructions. Your monitor  has already been registered assigning a specific monitor serial # to you.  Billing and Patient Assistance Program Information  We have supplied [employer] with any of  your insurance information on file for billing purposes. [employer] offers a sliding scale Patient Assistance Program for patients that do not have  insurance, or whose insurance does not completely cover the cost of the ZIO monitor.  You must apply for the Patient Assistance Program to qualify for this discounted rate.  To apply, please call [employer] at [phone], select option 4, select option 2, ask to apply for  Patient Assistance Program. Meredeth Ide will ask your household income, and how many people  are in your household. They will quote your out-of-pocket cost based on that information.  [employer] will also be able to set up a 48-month, interest-free payment plan if needed.  Applying the monitor   Shave hair from upper left chest.  Hold abrader disc by orange tab. Rub abrader in 40 strokes over the upper left chest as  indicated in your monitor instructions.  Clean area with 4 enclosed alcohol pads. Let dry.  Apply patch as indicated in monitor instructions. Patch will be placed under collarbone on left  side of chest with arrow pointing upward.  Rub patch adhesive wings for 2 minutes. Remove white label marked "1". Remove the white  label marked "2". Rub patch adhesive wings for 2 additional minutes.  While looking in a mirror, press and release button in  center of patch. A small green light will  flash 3-4 times. This will be your only indicator that the monitor has been turned on.  Do not shower for the first 24 hours. You may shower after the first 24 hours.  Press the button if you feel a symptom. You will hear a small click. Record Date, Time and  Symptom in the Patient Logbook.  When you are ready to remove the patch, follow instructions on the last 2 pages of Patient  Logbook. Stick patch monitor onto the last page of Patient Logbook.  Place Patient Logbook in the blue and white box. Use locking tab on box and tape box closed  securely. The blue and white box has prepaid postage  on it. Please place it in the mailbox as  soon as possible. Your physician should have your test results approximately 7 days after the  monitor has been mailed back to [facility].  Call [facility] Customer Care at [phone] if you have questions regarding  your ZIO XT patch monitor. Call them immediately if you see an orange light blinking on your  monitor.  If your monitor falls off in less than 4 days, contact our Monitor department at [phone].  If your monitor becomes loose or falls off after 4 days call [employer] at [phone] for  suggestions on securing your monitor    Follow-Up: At [facility], you and your health needs are our priority.  As part of our continuing mission to provide you with exceptional heart care, we have created designated Provider Care Teams.  These Care Teams include your primary Cardiologist (physician) and Advanced Practice Providers (APPs -  Physician Assistants and Nurse Practitioners) who all work together to provide you with the care you need, when you need it.  We recommend signing up for the patient portal called "MyChart".  Sign up information is provided on this After Visit Summary.  MyChart is used to connect with patients for Virtual Visits (Telemedicine).  Patients are able to view lab/test results, encounter notes, upcoming appointments, etc.  Non-urgent messages can be sent to your provider as well.   To learn more about what you can do with MyChart, go to [email or web address].    Your next appointment:   4-6 WEEKS  Provider:   Chilton Si, MD OR APP   Other Instructions Your physician has requested that you regularly monitor and record your blood pressure readings at home. Please use the same machine at the same time of day to check your readings and record them to bring to your follow-up visit.   Please monitor blood pressures and keep a log of your readings for 2 weeks and send in readings.    Make sure  to check 2 hours after your medications.    AVOID these things for 30 minutes before checking your blood pressure: No Drinking caffeine. No Drinking alcohol. No Eating. No Smoking. No Exercising.   Five minutes before checking your blood pressure: Pee. Sit in a dining chair. Avoid sitting in a soft couch or armchair. Be quiet. Do not talk

## 2023-06-21 LAB — BASIC METABOLIC PANEL
BUN/Creatinine Ratio: 20 (ref 12–28)
BUN: 16 mg/dL (ref 8–27)
CO2: 27 mmol/L (ref 20–29)
Calcium: 9.9 mg/dL (ref 8.7–10.3)
Chloride: 100 mmol/L (ref 96–106)
Creatinine, Ser: 0.81 mg/dL (ref 0.57–1.00)
Glucose: 91 mg/dL (ref 70–99)
Potassium: 4 mmol/L (ref 3.5–5.2)
Sodium: 144 mmol/L (ref 134–144)
eGFR: 77 mL/min/{1.73_m2} (ref >59)

## 2023-06-21 LAB — CBC
Hematocrit: 44.4 % (ref 34.0–46.6)
Hemoglobin: 14.3 g/dL (ref 11.1–15.9)
MCH: 31.8 pg (ref 26.6–33.0)
MCHC: 32.2 g/dL (ref 31.5–35.7)
MCV: 99 fL — ABNORMAL HIGH (ref 79–97)
Platelets: 198 10*3/uL (ref 150–450)
RBC: 4.5 x10E6/uL (ref 3.77–5.28)
RDW: 12.2 % (ref 11.7–15.4)
WBC: 5.4 10*3/uL (ref 3.4–10.8)

## 2023-06-21 LAB — TSH: TSH: 1.02 u[IU]/mL (ref 0.450–4.500)

## 2023-06-25 DIAGNOSIS — F900 Attention-deficit hyperactivity disorder, predominantly inattentive type: Secondary | ICD-10-CM | POA: Diagnosis not present

## 2023-06-25 DIAGNOSIS — F411 Generalized anxiety disorder: Secondary | ICD-10-CM | POA: Diagnosis not present

## 2023-06-27 ENCOUNTER — Telehealth (HOSPITAL_COMMUNITY): Payer: Self-pay | Admitting: *Deleted

## 2023-06-27 NOTE — Telephone Encounter (Addendum)
Reaching out to patient to offer assistance regarding upcoming cardiac imaging study; pt verbalizes understanding of appt date/time, parking situation and where to check in, pre-test NPO status and medications ordered, and verified current allergies; name and call back number provided for further questions should they arise Johney Frame RN Navigator Cardiac Imaging [facility] [phone] office [phone] cell  Patient aware to hold medication if HR<65.  Patient will check HR/BP in the morning and take medication based on reading.  Aware to call with any questions.

## 2023-06-27 NOTE — Telephone Encounter (Signed)
Attempted to call patient regarding upcoming cardiac CT appointment. Left message on voicemail with name and callback number Hayley Sharpe RN Navigator Cardiac Imaging [facility] [phone] Office   

## 2023-06-28 ENCOUNTER — Ambulatory Visit (HOSPITAL_COMMUNITY)
Admission: RE | Admit: 2023-06-28 | Discharge: 2023-06-28 | Disposition: A | Discharge status: Home / Self Care | Payer: Medicare HMO | Source: Ambulatory Visit | Attending: Physician Assistant | Admitting: Physician Assistant

## 2023-06-28 DIAGNOSIS — R55 Syncope and collapse: Secondary | ICD-10-CM | POA: Diagnosis not present

## 2023-06-28 DIAGNOSIS — R0602 Shortness of breath: Secondary | ICD-10-CM | POA: Diagnosis not present

## 2023-06-28 DIAGNOSIS — R072 Precordial pain: Secondary | ICD-10-CM | POA: Diagnosis not present

## 2023-06-28 DIAGNOSIS — I10 Essential (primary) hypertension: Secondary | ICD-10-CM | POA: Diagnosis not present

## 2023-06-28 MED ORDER — NITROGLYCERIN 0.4 MG SL SUBL: 0.8000 mg | SUBLINGUAL_TABLET | Freq: Once | SUBLINGUAL | Status: DC

## 2023-06-28 MED ORDER — NITROGLYCERIN 0.4 MG SL SUBL
SUBLINGUAL_TABLET | SUBLINGUAL | Status: AC
  Filled 2023-06-28: qty 2

## 2023-06-28 MED ORDER — SODIUM CHLORIDE 0.9 % IV BOLUS
250.0000 mL | Freq: Once | INTRAVENOUS | Status: AC
  Administered 2023-06-28: 250 mL via INTRAVENOUS

## 2023-06-28 MED ORDER — NITROGLYCERIN 0.4 MG SL SUBL
0.4000 mg | SUBLINGUAL_TABLET | Freq: Once | SUBLINGUAL | Status: AC
  Administered 2023-06-28: 0.4 mg via SUBLINGUAL

## 2023-06-28 MED ORDER — IOHEXOL 350 MG/ML SOLN
95.0000 mL | Freq: Once | INTRAVENOUS | Status: AC | PRN
  Administered 2023-06-28: 95 mL via INTRAVENOUS

## 2023-06-28 NOTE — Progress Notes (Signed)
Patient ID: Brianna Gates, female   DOB: 02-10-1951, 72 y.o.   MRN: [id number] pt. Bp 98/45 heart rate 48 . No complaints of dizziness or lightheadness. Dr.mclean called. Give 250 cc normal saline fluid bolus continue with CT heart

## 2023-06-28 NOTE — Progress Notes (Signed)
Patient ID: Brianna Gates, female   DOB: 24-Oct-1950, 72 y.o.   MRN: [id number] pt given second bolus of 250 normal saline per ct heart protocal. Bp 98/46 heart rate 47   Dr. Shirlee Latch Cardiologist called per Dr. Rip Harbour to do heart CT

## 2023-06-29 ENCOUNTER — Encounter: Payer: Self-pay | Admitting: Physician Assistant

## 2023-06-29 ENCOUNTER — Telehealth (HOSPITAL_BASED_OUTPATIENT_CLINIC_OR_DEPARTMENT_OTHER): Payer: Self-pay | Admitting: Cardiovascular Disease

## 2023-06-29 DIAGNOSIS — I2510 Atherosclerotic heart disease of native coronary artery without angina pectoris: Secondary | ICD-10-CM | POA: Insufficient documentation

## 2023-06-29 HISTORY — DX: Atherosclerotic heart disease of native coronary artery without angina pectoris: I25.10

## 2023-06-29 NOTE — Telephone Encounter (Signed)
Did have CT yesterday  Is concerned about the diastolic blood pressures and heart rate being so low  Palpitations worse   Did take Metoprolol 100 mg yesterday for Cardiac CT  Was told not to wear monitor until after echo which is scheduled for 9/23  Discussed with [facility] and ok for patient to wear monitor now if she would like  Sent message in [place] along with information discussed, she was not at home to write recommendations down   Did advise to call back if no improvement

## 2023-06-29 NOTE — Telephone Encounter (Signed)
If BP reading is low but asymptomatic with no lightheadedness, dizziness there is no need for concern.   Recommend checking BP prior to her Losartan this evening.  If systolic BP (the top number) more than 130, take Losartan 50mg .  If systolic BP between 110-130, take Losartan 25mg .  If systolic BP <110, hold Losartan.   Brianna Sorrow, NP

## 2023-06-29 NOTE — Telephone Encounter (Signed)
Pt c/o BP issue: STAT if pt c/o blurred vision, one-sided weakness or slurred speech  1. What are your last 5 BP readings?   This morning prior to medication (levothyroxine (SYNTHROID) 50 MCG tablet ) 142/56 HR 51  2. Are you having any other symptoms (ex. Dizziness, headache, blurred vision, passed out)?   Dizziness, palpitation  3. What is your BP issue?   Patient stated she is concerned about low BP reading at her test yesterday.  Patient stated she did not take any losartan last night.

## 2023-06-30 ENCOUNTER — Encounter: Payer: Self-pay | Admitting: Neurology

## 2023-07-02 ENCOUNTER — Other Ambulatory Visit: Payer: Self-pay

## 2023-07-02 ENCOUNTER — Telehealth: Payer: Self-pay

## 2023-07-02 MED ORDER — GABAPENTIN 300 MG PO CAPS: 300.0000 mg | ORAL_CAPSULE | Freq: Three times a day (TID) | ORAL | 11 refills | Status: DC

## 2023-07-02 NOTE — Telephone Encounter (Signed)
If any concern for side effect from higher dose gabapentin 400 mg 3 times daily, would recommend going back down to 300 mg 3 times daily. Not clear that her symptoms are directly related to such a small increase in gabapentin, but if occurred at same time, reasonable to go back to lower dosing. Thanks

## 2023-07-02 NOTE — Telephone Encounter (Signed)
Lmtcb and sent recommendations from sarah via [employer]

## 2023-07-02 NOTE — Progress Notes (Signed)
Notified pharmacy to cancel gabapentin 400 mg and sent in script for Gabapentin 300 mg 3 times daily

## 2023-07-02 NOTE — Telephone Encounter (Signed)
Call to patient, she states that since increasing the gabapentin to 400 mg, she is having dizziness and heart palpitations. She had cardiac CT last week and is having ECHO next week. She has had ongoing issues with low diastolic reading in the 50's prior to increased gabapentin. She is concerned if the increased dose can contribute to dizziness and palpitation. Advise I would send to Brianna Gates for advice/recommendations. Patient appreciative of call and aware [name] is not in the office and is okay with waiting for her response.

## 2023-07-03 DIAGNOSIS — J3081 Allergic rhinitis due to animal (cat) (dog) hair and dander: Secondary | ICD-10-CM | POA: Diagnosis not present

## 2023-07-03 DIAGNOSIS — J3089 Other allergic rhinitis: Secondary | ICD-10-CM | POA: Diagnosis not present

## 2023-07-03 DIAGNOSIS — J301 Allergic rhinitis due to pollen: Secondary | ICD-10-CM | POA: Diagnosis not present

## 2023-07-09 ENCOUNTER — Ambulatory Visit (HOSPITAL_COMMUNITY): Payer: Medicare HMO | Attending: Physician Assistant

## 2023-07-09 DIAGNOSIS — R072 Precordial pain: Secondary | ICD-10-CM | POA: Diagnosis not present

## 2023-07-09 DIAGNOSIS — R0602 Shortness of breath: Secondary | ICD-10-CM | POA: Diagnosis not present

## 2023-07-09 DIAGNOSIS — R55 Syncope and collapse: Secondary | ICD-10-CM | POA: Insufficient documentation

## 2023-07-09 DIAGNOSIS — I10 Essential (primary) hypertension: Secondary | ICD-10-CM | POA: Insufficient documentation

## 2023-07-09 LAB — ECHOCARDIOGRAM COMPLETE
Area-P 1/2: 3.13 cm2
S' Lateral: 2.3 cm

## 2023-07-10 DIAGNOSIS — R079 Chest pain, unspecified: Secondary | ICD-10-CM

## 2023-07-10 DIAGNOSIS — R55 Syncope and collapse: Secondary | ICD-10-CM | POA: Diagnosis not present

## 2023-07-10 DIAGNOSIS — I10 Essential (primary) hypertension: Secondary | ICD-10-CM | POA: Diagnosis not present

## 2023-07-10 DIAGNOSIS — R0602 Shortness of breath: Secondary | ICD-10-CM | POA: Diagnosis not present

## 2023-07-17 DIAGNOSIS — J3089 Other allergic rhinitis: Secondary | ICD-10-CM | POA: Diagnosis not present

## 2023-07-17 DIAGNOSIS — J301 Allergic rhinitis due to pollen: Secondary | ICD-10-CM | POA: Diagnosis not present

## 2023-07-17 DIAGNOSIS — J3081 Allergic rhinitis due to animal (cat) (dog) hair and dander: Secondary | ICD-10-CM | POA: Diagnosis not present

## 2023-07-20 DIAGNOSIS — F900 Attention-deficit hyperactivity disorder, predominantly inattentive type: Secondary | ICD-10-CM | POA: Diagnosis not present

## 2023-07-20 DIAGNOSIS — F411 Generalized anxiety disorder: Secondary | ICD-10-CM | POA: Diagnosis not present

## 2023-07-26 ENCOUNTER — Other Ambulatory Visit (HOSPITAL_BASED_OUTPATIENT_CLINIC_OR_DEPARTMENT_OTHER): Payer: Self-pay

## 2023-07-26 MED ORDER — COMIRNATY 30 MCG/0.3ML IM SUSY
PREFILLED_SYRINGE | INTRAMUSCULAR | 0 refills | Status: DC
  Filled 2023-07-26: qty 0.3, 1d supply, fill #0

## 2023-07-30 ENCOUNTER — Encounter (HOSPITAL_BASED_OUTPATIENT_CLINIC_OR_DEPARTMENT_OTHER): Payer: Self-pay | Admitting: Family

## 2023-07-30 ENCOUNTER — Ambulatory Visit (HOSPITAL_BASED_OUTPATIENT_CLINIC_OR_DEPARTMENT_OTHER): Payer: Medicare HMO | Admitting: Family

## 2023-07-30 VITALS — BP 106/64 | HR 74 | Ht 64.0 in | Wt 119.4 lb

## 2023-07-30 DIAGNOSIS — R002 Palpitations: Secondary | ICD-10-CM | POA: Diagnosis not present

## 2023-07-30 DIAGNOSIS — I2510 Atherosclerotic heart disease of native coronary artery without angina pectoris: Secondary | ICD-10-CM | POA: Diagnosis not present

## 2023-07-30 DIAGNOSIS — E782 Mixed hyperlipidemia: Secondary | ICD-10-CM | POA: Diagnosis not present

## 2023-07-30 DIAGNOSIS — I10 Essential (primary) hypertension: Secondary | ICD-10-CM

## 2023-07-30 MED ORDER — LOSARTAN POTASSIUM 50 MG PO TABS
50.0000 mg | ORAL_TABLET | Freq: Every day | ORAL | 3 refills | Status: DC
Start: 2023-07-30 — End: 2023-08-07

## 2023-07-30 MED ORDER — ROSUVASTATIN CALCIUM 10 MG PO TABS
10.0000 mg | ORAL_TABLET | ORAL | 3 refills | Status: DC
Start: 2023-07-30 — End: 2023-11-21

## 2023-07-30 NOTE — Progress Notes (Unsigned)
Cardiology Office Note:  .   Date:  07/30/2023  ID:  Brianna Gates, DOB May 19, 1951, MRN [id number] PCP: Chilton Greathouse, MD  [facility] Providers Cardiologist:  Chilton Si, MD { Click to update primary MD,subspecialty MD or APP then REFRESH:1}   History of Present Illness: .   Brianna Gates is a 72 y.o. female with a hx of hypertension, hyperlipidemia, alcohol abuse, hypothyroidism, palpitations, mild nonobstructive coronary artery disease, Guillain-Barr, Raynaud's   She was seen by primary care January 2023 and noted labile blood pressures.  She was on nebivolol and losartan at that time.  Her losartan has been increased and her blood pressure remains labile.   She was seen 12/12/2021.  Her nebivolol was transitioned to chlorthalidone and losartan continued at 50 mg twice daily.  She was recommended for 3-day monitor due to palpitations which revealed predominantly NSR with <1% PVC/PAC burden and one short episode of SVT which was triggered. Reduciton of caffeine intake and increased hydration were encouraged.   Last seen on/4/24 noting recent episodes of lightheadedness.  Her home cuff was found to be accurate.  Losartan 50 mg twice daily and chlorthalidone 25 mg daily were continued.  She was recommended to monitor for BP for 2 to 3 weeks.  Noted daily palpitations with subsequent ZIO report not yet available.  Echo 07/09/2023 normal LVEF, grade 1 diastolic dysfunction, trivial MR/AI.  Due to occasional episodes of exertional chest discomfort coronary CTA 06/2023 calcium score of 15.7 with mild nonobstructive coronary artery disease.   Presents today for follow up. *** Reports her palpitations have been ongoing since last office visit. Discussed ZIO monitor not yet reviewed by company.   BP overall controlled but was high this morning with SBP 163 prior. She notes persistent sensation of with walking would feel dizzy ***. When she gets palptiations feels like a "hand"  on her chest but not pressure and some discomfort in her hips when walking on a family trip in August. Felt different than prior vertigo.   Concerns about cholesterol medication making her "Linden Dolin" reports she is "achey all the time". Also notes she has arthritis which could be contributory. Reassurance provided Rosuvastatin. Previous statin holiday with her PCP with no improvement in symptoms. Is debating trying fish oil for ***  We reviewed cardiac CTA results and echocardiogram which were reassuring.   Still with occasional episodes of dizziness which has improved. She is no longer taking Chlorthalidone. Reduced her Losartan from twice daily ***  Prior antihypertensives: Amlodipine-palpitations  ACE-cough Nebivolol-ineffective  ROS: Please see the history of present illness.    All other systems reviewed and are negative.   Studies Reviewed: .        *** Risk Assessment/Calculations:             Physical Exam:   VS:  BP 106/64 (BP Location: Left Arm, Patient Position: Sitting, Cuff Size: Normal)   Pulse 74   Ht 5\' 4"  (1.626 m)   Wt 119 lb 6.4 oz (54.2 kg)   SpO2 98%   BMI 20.49 kg/m    Wt Readings from Last 3 Encounters:  07/30/23 119 lb 6.4 oz (54.2 kg)  06/20/23 120 lb 9.6 oz (54.7 kg)  05/08/23 122 lb 8 oz (55.6 kg)    GEN: Well nourished, well developed in no acute distress NECK: No JVD; No carotid bruits CARDIAC: RRR, no murmurs, rubs, gallops RESPIRATORY:  Clear to auscultation without rales, wheezing or rhonchi  ABDOMEN: Soft, non-tender, non-distended  EXTREMITIES:  No edema; No deformity   ASSESSMENT AND PLAN: .    Palpitations - Monitor 06/2022 en route to ZIO for download. Will defer medication changes until results reviewed. Still experiencing palpitations. Echo 06/2023 ***  HTN -Home BP cuff previously found to be accurate.Controlled in clinic today. Overall controlled at home with isolated elevated reading this morning.   Mild nonobstructive CAD/HLD, LDL  goal less than 70 - 08/2022 LDL 80. 01/2023 LDL 96. GDMT Rosuvastatin, Aspirin. Taking Rosuvastatin 5mg  daily, will increase to Rosuvastatin to 10mg  daily.   Anxiety - Follows with counseling services and psychiatry.         Dispo: follow up in *** months with Dr. Duke Salvia   Signed, Alver Sorrow, NP

## 2023-07-30 NOTE — Patient Instructions (Signed)
Medication Instructions:  Your physician has recommended you make the following change in your medication:   Increase Crestor 10mg  daily   Decrease Losartan 50mg  daily   *If you need a refill on your cardiac medications before your next appointment, please call your pharmacy*   Lab Work: Your physician recommends that you return for lab work in 3 months for fasting Lipid panel and liver function tests   Testing/Procedures: "Cardiac CTA with nonobstructive coronary disease which is a good result. Echo with normal heart muscle function and no significant valvular abnormalities. We will let you know results of your monitor when we have them".   Follow-Up: At [facility], you and your health needs are our priority.  As part of our continuing mission to provide you with exceptional heart care, we have created designated Provider Care Teams.  These Care Teams include your primary Cardiologist (physician) and Advanced Practice Providers (APPs -  Physician Assistants and Nurse Practitioners) who all work together to provide you with the care you need, when you need it.  We recommend signing up for the patient portal called "MyChart".  Sign up information is provided on this After Visit Summary.  MyChart is used to connect with patients for Virtual Visits (Telemedicine).  Patients are able to view lab/test results, encounter notes, upcoming appointments, etc.  Non-urgent messages can be sent to your provider as well.   To learn more about what you can do with MyChart, go to [email or web address].    Your next appointment:   2-3 months with Dr. Duke Salvia or Gillian Shields, NP

## 2023-07-31 DIAGNOSIS — J3081 Allergic rhinitis due to animal (cat) (dog) hair and dander: Secondary | ICD-10-CM | POA: Diagnosis not present

## 2023-07-31 DIAGNOSIS — J3089 Other allergic rhinitis: Secondary | ICD-10-CM | POA: Diagnosis not present

## 2023-07-31 DIAGNOSIS — J301 Allergic rhinitis due to pollen: Secondary | ICD-10-CM | POA: Diagnosis not present

## 2023-08-01 ENCOUNTER — Encounter (HOSPITAL_BASED_OUTPATIENT_CLINIC_OR_DEPARTMENT_OTHER): Payer: Self-pay | Admitting: Family

## 2023-08-01 DIAGNOSIS — R55 Syncope and collapse: Secondary | ICD-10-CM | POA: Diagnosis not present

## 2023-08-01 DIAGNOSIS — I10 Essential (primary) hypertension: Secondary | ICD-10-CM | POA: Diagnosis not present

## 2023-08-06 ENCOUNTER — Telehealth (HOSPITAL_BASED_OUTPATIENT_CLINIC_OR_DEPARTMENT_OTHER): Payer: Self-pay | Admitting: Cardiovascular Disease

## 2023-08-06 ENCOUNTER — Encounter (HOSPITAL_BASED_OUTPATIENT_CLINIC_OR_DEPARTMENT_OTHER): Payer: Self-pay

## 2023-08-06 DIAGNOSIS — F330 Major depressive disorder, recurrent, mild: Secondary | ICD-10-CM | POA: Diagnosis not present

## 2023-08-06 DIAGNOSIS — J3089 Other allergic rhinitis: Secondary | ICD-10-CM | POA: Diagnosis not present

## 2023-08-06 DIAGNOSIS — J3081 Allergic rhinitis due to animal (cat) (dog) hair and dander: Secondary | ICD-10-CM | POA: Diagnosis not present

## 2023-08-06 DIAGNOSIS — F900 Attention-deficit hyperactivity disorder, predominantly inattentive type: Secondary | ICD-10-CM | POA: Diagnosis not present

## 2023-08-06 DIAGNOSIS — I10 Essential (primary) hypertension: Secondary | ICD-10-CM

## 2023-08-06 DIAGNOSIS — E782 Mixed hyperlipidemia: Secondary | ICD-10-CM

## 2023-08-06 DIAGNOSIS — F1021 Alcohol dependence, in remission: Secondary | ICD-10-CM | POA: Diagnosis not present

## 2023-08-06 DIAGNOSIS — J301 Allergic rhinitis due to pollen: Secondary | ICD-10-CM | POA: Diagnosis not present

## 2023-08-06 NOTE — Telephone Encounter (Signed)
BP log as requested 

## 2023-08-06 NOTE — Telephone Encounter (Signed)
Pt c/o BP issue: STAT if pt c/o blurred vision, one-sided weakness or slurred speech  1. What are your last 5 BP readings?   133/61  HR 65 132/60  HR 63 134/61  HR 66 139/55  HR 62 134/55  HR 64  2. Are you having any other symptoms (ex. Dizziness, headache, blurred vision, passed out)?   Palpitations and a little bit of dizziness  3. What is your BP issue?   Patient stated her medication was recently reduced and her BP has increased.

## 2023-08-07 MED ORDER — LOSARTAN POTASSIUM 25 MG PO TABS
ORAL_TABLET | ORAL | 3 refills | Status: DC
Start: 2023-08-07 — End: 2023-11-12

## 2023-08-07 NOTE — Telephone Encounter (Signed)
Patient called back, transferred from call center.   Patient states she took her losartan this morning about 0715 and just checked her blood pressure at 153/62 54 150/60 55   Patient would like to go ahead and try adding the 25mg  in the evening.   Clarified how patient should be taking her cholesterol medication   She is also needing clarification on what ADHD medications she could take. Advised her to talk to her psychiatrist and call us back with names of medications that they are considering.

## 2023-08-07 NOTE — Telephone Encounter (Signed)
Returned call to patient, recommendations reviewed with the patient, patient will go with option 1 and keep [place] updated!   Patient would like to switch from Dr. Duke Salvia to Dr. Tenny Craw and have follow up scheduled at [facility] st moving forward.      "BP reasonable. Not at goal <130/80 but thankfully not dropping too low like it was previously. There are 2 options:   1. Continue to monitor BP until next office visit and contact us if SBP persistently >140 (I.e. if gets 3 days in a row of SBP >140) 2. Continue Losartan 50mg  in the AM and add 25mg  in the evening.    Alver Sorrow, NP "

## 2023-08-07 NOTE — Telephone Encounter (Signed)
BP reasonable. Not at goal <130/80 but thankfully not dropping too low like it was previously. There are 2 options:  Continue to monitor BP until next office visit and contact us if SBP persistently >140 (I.e. if gets 3 days in a row of SBP >140) Continue Losartan 50mg  in the AM and add 25mg  in the evening.   Alver Sorrow, NP

## 2023-08-08 DIAGNOSIS — H02882 Meibomian gland dysfunction right lower eyelid: Secondary | ICD-10-CM | POA: Diagnosis not present

## 2023-08-08 DIAGNOSIS — H02885 Meibomian gland dysfunction left lower eyelid: Secondary | ICD-10-CM | POA: Diagnosis not present

## 2023-08-08 DIAGNOSIS — H04123 Dry eye syndrome of bilateral lacrimal glands: Secondary | ICD-10-CM | POA: Diagnosis not present

## 2023-08-08 NOTE — Telephone Encounter (Signed)
Ok per Dr Duke Salvia to switch

## 2023-08-13 ENCOUNTER — Ambulatory Visit (INDEPENDENT_AMBULATORY_CARE_PROVIDER_SITE_OTHER): Payer: Medicare HMO

## 2023-08-13 ENCOUNTER — Encounter: Payer: Self-pay | Admitting: Family Medicine

## 2023-08-13 ENCOUNTER — Ambulatory Visit: Payer: Medicare HMO | Admitting: Family Medicine

## 2023-08-13 VITALS — BP 136/78 | HR 55 | Ht 64.0 in | Wt 119.0 lb

## 2023-08-13 DIAGNOSIS — G8929 Other chronic pain: Secondary | ICD-10-CM

## 2023-08-13 DIAGNOSIS — M542 Cervicalgia: Secondary | ICD-10-CM

## 2023-08-13 DIAGNOSIS — M25512 Pain in left shoulder: Secondary | ICD-10-CM

## 2023-08-13 DIAGNOSIS — M25511 Pain in right shoulder: Secondary | ICD-10-CM

## 2023-08-13 DIAGNOSIS — M19011 Primary osteoarthritis, right shoulder: Secondary | ICD-10-CM | POA: Diagnosis not present

## 2023-08-13 DIAGNOSIS — J3089 Other allergic rhinitis: Secondary | ICD-10-CM | POA: Diagnosis not present

## 2023-08-13 DIAGNOSIS — J3081 Allergic rhinitis due to animal (cat) (dog) hair and dander: Secondary | ICD-10-CM | POA: Diagnosis not present

## 2023-08-13 DIAGNOSIS — J301 Allergic rhinitis due to pollen: Secondary | ICD-10-CM | POA: Diagnosis not present

## 2023-08-13 NOTE — Progress Notes (Signed)
I, Stevenson Clinch, CMA acting as a Neurosurgeon for Clementeen Graham, MD.  Brianna Gates is a 72 y.o. female who presents to [facility] at [facility] today for bilateral shoulder and  constant neck pain x 1-2 months. Pt locates pain to left side of the neck. 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 Has been seen by [facility] in the past, notes bone spur and partial tear in the left shoulder. Has had injections with [facility] and [facility] in the past. Locates bilat shoulder pain to deep in the joint. Notes limited ROM. Has bradycardia, causing dizziness, being evaluated by Cardiology.   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  Exam:  BP 136/78   Pulse (!) 55   Ht 5\' 4"  (1.626 m)   Wt 119 lb (54 kg)   SpO2 95%   BMI 20.43 kg/m  General: Well Developed, well nourished, and in no acute distress.   MSK: C-spine: Normal appearing. Nontender palpation spinal midline. Decreased cervical range of motion. Up extremity strength is diminished abduction otherwise intact. Reflexes are intact.  Shoulders bilaterally normal appearing. Left shoulder abduction range of motion limited to about 100 degrees. External and internal rotation are intact.  Right shoulder range of motion 120 degrees abduction.  External and internal rotation range of motion intact.  Strength is intact within limits of motion bilateral shoulders.    Lab and Radiology Results  X-ray images cervical spine and bilateral shoulders obtained today personally and independently interpreted.  Cervical spine: Multilevel DDD.  No acute fractures are visible.  Right shoulder: Limited humeral head  possible subacromial impingement.  No acute fractures.  No severe glenohumeral DJD.  Left shoulder: AC DJD.  No acute fractures are visible.  No severe glenohumeral DJD.  Await formal radiology review    Assessment and Plan: 72 y.o. female with bilateral neck pain and bilateral shoulder pain.  Symptoms predominantly due to muscle dysfunction spasm and some shoulder impingement.  She is a good candidate for trial of physical therapy.  Plan to refer to PT.  Recheck in 6 weeks.  Not improved consider shoulder injection or MRI of cervical spine or both.   PDMP not reviewed this encounter. Orders Placed This Encounter  Procedures   DG Cervical Spine 2 or 3 views    Standing Status:   Future    Number of Occurrences:   1    Standing Expiration Date:   08/12/2024    Order Specific Question:   Reason for Exam (SYMPTOM  OR DIAGNOSIS REQUIRED)    Answer:   neck pain    Order Specific Question:   Preferred imaging location?    Answer:   [facility]   DG Shoulder Left    Standing Status:   Future    Number of Occurrences:   1    Standing Expiration Date:   09/13/2023    Order Specific Question:   Reason for Exam (SYMPTOM  OR DIAGNOSIS REQUIRED)    Answer:   bilateral shoulder pain    Order Specific Question:   Preferred imaging location?    Answer:   [facility]   DG Shoulder Right    Standing Status:   Future  Number of Occurrences:   1    Standing Expiration Date:   08/12/2024    Order Specific Question:   Reason for Exam (SYMPTOM  OR DIAGNOSIS REQUIRED)    Answer:   bilateral shoulder pain    Order Specific Question:   Preferred imaging location?    Answer:   [facility]   Ambulatory referral to Physical Therapy    Referral Priority:   Routine    Referral Type:   Physical Medicine    Referral Reason:   Specialty Services Required    Requested Specialty:   Physical Therapy    Number of Visits Requested:   1   No orders of the defined types were placed  in this encounter.    Discussed warning signs or symptoms. Please see discharge instructions. Patient expresses understanding.   The above documentation has been reviewed and is accurate and complete Clementeen Graham, M.D.

## 2023-08-13 NOTE — Patient Instructions (Addendum)
Thank you for coming in today.   Please get an Xray today before you leave   I've referred you to Physical Therapy.  Let us know if you don't hear from them in one week.   Check back in 6 weeks 

## 2023-08-14 NOTE — Telephone Encounter (Addendum)
Left a message for the Brianna Gates to call back to move her to see Dr Tenny Craw for Gen Cards (okay with Dr Tenny Craw).. for her next OV due 10/2023.. Brianna Gates already seeing Gillian Shields but may be able to move her to Dr Tenny Craw to be established instead.

## 2023-08-20 ENCOUNTER — Ambulatory Visit: Payer: Medicare HMO | Admitting: Podiatry

## 2023-08-20 DIAGNOSIS — J3081 Allergic rhinitis due to animal (cat) (dog) hair and dander: Secondary | ICD-10-CM | POA: Diagnosis not present

## 2023-08-20 DIAGNOSIS — G629 Polyneuropathy, unspecified: Secondary | ICD-10-CM | POA: Diagnosis not present

## 2023-08-20 DIAGNOSIS — Z0189 Encounter for other specified special examinations: Secondary | ICD-10-CM | POA: Diagnosis not present

## 2023-08-20 DIAGNOSIS — Z1212 Encounter for screening for malignant neoplasm of rectum: Secondary | ICD-10-CM | POA: Diagnosis not present

## 2023-08-20 DIAGNOSIS — J301 Allergic rhinitis due to pollen: Secondary | ICD-10-CM | POA: Diagnosis not present

## 2023-08-20 DIAGNOSIS — I10 Essential (primary) hypertension: Secondary | ICD-10-CM | POA: Diagnosis not present

## 2023-08-20 DIAGNOSIS — J3089 Other allergic rhinitis: Secondary | ICD-10-CM | POA: Diagnosis not present

## 2023-08-20 DIAGNOSIS — E755 Other lipid storage disorders: Secondary | ICD-10-CM | POA: Diagnosis not present

## 2023-08-20 DIAGNOSIS — E041 Nontoxic single thyroid nodule: Secondary | ICD-10-CM | POA: Diagnosis not present

## 2023-08-20 DIAGNOSIS — E039 Hypothyroidism, unspecified: Secondary | ICD-10-CM | POA: Diagnosis not present

## 2023-08-20 DIAGNOSIS — Z0000 Encounter for general adult medical examination without abnormal findings: Secondary | ICD-10-CM | POA: Diagnosis not present

## 2023-08-21 DIAGNOSIS — F900 Attention-deficit hyperactivity disorder, predominantly inattentive type: Secondary | ICD-10-CM | POA: Diagnosis not present

## 2023-08-21 DIAGNOSIS — F330 Major depressive disorder, recurrent, mild: Secondary | ICD-10-CM | POA: Diagnosis not present

## 2023-08-22 ENCOUNTER — Other Ambulatory Visit: Payer: Self-pay

## 2023-08-22 ENCOUNTER — Ambulatory Visit: Payer: Medicare HMO | Attending: Family Medicine | Admitting: Rehabilitative and Restorative Service Providers"

## 2023-08-22 ENCOUNTER — Encounter: Payer: Self-pay | Admitting: Rehabilitative and Restorative Service Providers"

## 2023-08-22 DIAGNOSIS — M25512 Pain in left shoulder: Secondary | ICD-10-CM | POA: Insufficient documentation

## 2023-08-22 DIAGNOSIS — M542 Cervicalgia: Secondary | ICD-10-CM | POA: Diagnosis not present

## 2023-08-22 DIAGNOSIS — R252 Cramp and spasm: Secondary | ICD-10-CM

## 2023-08-22 DIAGNOSIS — G8929 Other chronic pain: Secondary | ICD-10-CM | POA: Diagnosis not present

## 2023-08-22 DIAGNOSIS — M6281 Muscle weakness (generalized): Secondary | ICD-10-CM

## 2023-08-22 DIAGNOSIS — M25511 Pain in right shoulder: Secondary | ICD-10-CM | POA: Insufficient documentation

## 2023-08-22 NOTE — Therapy (Signed)
OUTPATIENT PHYSICAL THERAPY CERVICAL EVALUATION   Patient Name: Brianna Gates MRN: [id number] DOB:10/09/1951, 72 y.o., female Today's Date: 08/22/2023  END OF SESSION:  PT End of Session - 08/22/23 0942     Visit Number 1    Date for PT Re-Evaluation 10/12/23    Authorization Type [employer]    Progress Note Due on Visit 10    PT Start Time 0935    PT Stop Time 1010    PT Time Calculation (min) 35 min    Activity Tolerance Patient tolerated treatment well    Behavior During Therapy [facility] for tasks assessed/performed             Past Medical History:  Diagnosis Date   Allergy    Anxiety    Asthma    INFREQUENT PROBLEM - rarely used inhaler   CAD (coronary artery disease) 06/29/2023   CCTA 06/27/23: CAC score 15.7 (45th percentile), mild nonobstructive CAD [mid LAD 1-24, proximal LCx 1-24]    Cervical spondylosis    Cervical strain 07/22/2014   Chest pain of uncertain etiology 12/13/2021   Complication of anesthesia    UNKNOWN REACTION WITH RASH AT SITE OF IV only with knee surgery   Degenerative arthritis    Dyslipidemia    Eczema    GERD (gastroesophageal reflux disease)    Guillain-Barre syndrome (HCC)    History of Guillain-Barre syndrome 07/22/2014   Hyperlipidemia    Hypertension    Hypothyroidism    Lumbosacral spondylosis    Palpitations 12/12/2021   Pure hypercholesterolemia 12/13/2021   Raynaud disease    Thyroid nodule    Past Surgical History:  Procedure Laterality Date   ABDOMINAL HYSTERECTOMY     COLONOSCOPY     ESOPHAGOGASTRODUODENOSCOPY (EGD) WITH PROPOFOL N/A 09/13/2021   Procedure: ESOPHAGOGASTRODUODENOSCOPY (EGD) WITH PROPOFOL;  Surgeon: Hilarie Fredrickson, MD;  Location: [facility];  Service: Endoscopy;  Laterality: N/A;   FOOT SURGERY Left    GANGLION CYST EXCISION Left    Wrist   HERNIA REPAIR     KNEE ARTHROSCOPY     RT KNEE   KNEE ARTHROTOMY Right 08/27/2013   Procedure: RIGHT KNEE SCAR EXCISION AND FEMORAL REVISION;   Surgeon: Loanne Drilling, MD;  Location: [facility];  Service: Orthopedics;  Laterality: Right;   REPLACEMENT TOTAL KNEE Right    2014, 2021   REPLACEMENT TOTAL KNEE BILATERAL Bilateral    STRABISMUS SURGERY Right    TONSILLECTOMY     vaginal vulvo prolapse  09/16/2015   Patient Active Problem List   Diagnosis Date Noted   CAD (coronary artery disease) 06/29/2023   Paresthesia 10/18/2022   Onychomycosis 01/29/2022   Chest pain of uncertain etiology 12/13/2021   Pure hypercholesterolemia 12/13/2021   Palpitations 12/12/2021   Esophageal dysphagia    Allergic rhinitis 04/04/2021   Allergic rhinitis due to animal (cat) (dog) hair and dander 04/04/2021   Allergic rhinitis due to pollen 04/04/2021   Hormone deficiency 04/04/2021   Syncope and collapse 12/27/2020   Hypotension 12/27/2020   Bradycardia 12/27/2020   History of COVID-19 12/27/2020   Preoperative evaluation to rule out surgical contraindication 07/16/2020   Arthrofibrosis of knee joint, right 11/26/2019   Arthritis of knee 07/30/2019   Presence of right artificial knee joint 07/30/2019   Sleep apnea 10/16/2018   Impingement syndrome of left shoulder region 01/15/2018   Gastroesophageal reflux disease 02/02/2016   Sensorineural hearing loss (SNHL), bilateral 02/02/2016   Vaginal vault prolapse after hysterectomy 07/28/2015  History of Guillain-Barre syndrome 07/22/2014   Cervical strain 07/22/2014   Postoperative stiffness of total knee replacement (HCC) 08/27/2013   Abnormality of gait 08/22/2012   Cervical spondylosis without myelopathy 08/22/2012   Muscle weakness (generalized) 08/22/2012   Guillain-Barre syndrome (HCC) 08/22/2012   Essential hypertension 08/22/2012   Strabismus 08/22/2012   Degenerative joint disease of knee, left 08/22/2012   Hypothyroidism 08/22/2012   Raynaud's disease 08/22/2012   Knee joint replacement by other means 08/22/2012    PCP: Chilton Greathouse, MD  REFERRING PROVIDER: Rodolph Bong, MD  REFERRING DIAG: M25.511,G89.29,M25.512 (ICD-10-CM) - Chronic pain of both shoulders M54.2,G89.29 (ICD-10-CM) - Neck pain, chronic  THERAPY DIAG:  Cervicalgia - Plan: PT plan of care cert/re-cert  Cramp and spasm - Plan: PT plan of care cert/re-cert  Muscle weakness (generalized) - Plan: PT plan of care cert/re-cert  Bilateral shoulder pain, unspecified chronicity - Plan: PT plan of care cert/re-cert  Rationale for Evaluation and Treatment: Rehabilitation  ONSET DATE: Has had pain for years, but it has gotten worse in past 2-3 months   SUBJECTIVE:                                                                                                                                                                                                         SUBJECTIVE STATEMENT: Patient states that she has had a history of shoulder pain for years, but in the past 2-3 months she has noticed increased cervical and shoulder pain.  Patient went to Dr Denyse Amass and was given radiographs.  Patient is hoping to come to PT to have some relief of pain.    Hand dominance: Right  PERTINENT HISTORY:  Bilateral TKA (with 2 revisions on right knee), HTN, Raynaud's disease, CAD, hard of hearing (wears hearing aide), asthma, OA  PAIN:  Are you having pain? Yes: NPRS scale: 3-7/10 Pain location: cervical and bilat shoulder Pain description: "just there" Aggravating factors: sleeping, overhead reaching Relieving factors: sometimes medication  PRECAUTIONS: None  RED FLAGS: None     WEIGHT BEARING RESTRICTIONS: No  FALLS:  Has patient fallen in last 6 months?  Has not had a fall, but has tripped recently  LIVING ENVIRONMENT: Lives with: lives with their spouse Lives in: House/apartment Stairs:  one level home with upstairs bonus room above the garage Has following equipment at home: Single point cane, Environmental consultant - 2 wheeled, and Grab bars  OCCUPATION: Retired  PLOF: Independent and Leisure:  outdoors, water aerobics, exercise, spending time with family/friends  PATIENT GOALS: To be stronger to be able  to lift items around home, like the mattress to change the sheets easier.  NEXT MD VISIT: Dr Denyse Amass on December 9th  OBJECTIVE:  Note: Objective measures were completed at Evaluation unless otherwise noted.  DIAGNOSTIC FINDINGS:  Per notes from Dr Denyse Amass on 08/13/2023: X-ray images cervical spine and bilateral shoulders obtained today personally and independently interpreted.   Cervical spine: Multilevel DDD.  No acute fractures are visible.   Right shoulder: Limited humeral head possible subacromial impingement.  No acute fractures.  No severe glenohumeral DJD.   Left shoulder: AC DJD.  No acute fractures are visible.  No severe glenohumeral DJD.  PATIENT SURVEYS:  Eval: FOTO 41 (projected 72 by visit 13)  COGNITION: Overall cognitive status: Within functional limits for tasks assessed  SENSATION: Reports numbness in feet from GBS.  At times, has numbness and tingling in hands, as well.  POSTURE: rounded shoulders, forward head, and increased thoracic kyphosis  PALPATION: Pt with tight musculature noted in bilateral cervical region   CERVICAL ROM:   Active ROM A/PROM (deg) eval  Flexion 55  Extension 40  Right lateral flexion 20  Left lateral flexion 20  Right rotation 50  Left rotation 45   (Blank rows = not tested)  UPPER EXTREMITY ROM:  Active ROM Right eval Left eval  Shoulder flexion 135 130  Shoulder extension 70 65  Shoulder abduction 137 130   (Blank rows = not tested)  UPPER EXTREMITY MMT:  Eval:   Bilateral shoulder strength grossly 4 to 4+/5 throughout  FUNCTIONAL TESTS:  Eval:   5 times sit to stand: 11.24 sec  TODAY'S TREATMENT:                                                                                                                              DATE: 08/22/2023 Reviewed HEP and role of PT   PATIENT EDUCATION:  Education  details: Issued HEP Person educated: Patient Education method: Explanation, Demonstration, and Handouts Education comprehension: verbalized understanding  HOME EXERCISE PROGRAM: Access Code: [email or web address] URL: [email or web address] Date: 08/22/2023 Prepared by: Clydie Braun Annalyssa Thune  Exercises - Seated Scapular Retraction  - 1 x daily - 7 x weekly - 2 sets - 10 reps - Seated Cervical Rotation AROM  - 1 x daily - 7 x weekly - 2 sets - 10 reps - Seated Cervical Sidebending AROM  - 1 x daily - 7 x weekly - 2 sets - 10 reps - Seated Cervical Retraction  - 1 x daily - 7 x weekly - 2 sets - 10 reps  ASSESSMENT:  CLINICAL IMPRESSION: Patient is a 72 y.o. female who was seen today for physical therapy evaluation and treatment for neck and shoulder pain.  Patient states that she has had shoulder pain in the past and has had PT in the past, but she was doing better until a few months ago when her pain started worsening.  Patient presents with increased pain, decreased ROM, functional shoulder weakness,  muscle spasms, and difficulty performing various tasks in home.  Patient would benefit from skilled PT to progress her towards her goals of increased activity without increased pain.  OBJECTIVE IMPAIRMENTS: decreased ROM, decreased strength, increased muscle spasms, impaired flexibility, impaired UE functional use, improper body mechanics, postural dysfunction, and pain.   ACTIVITY LIMITATIONS: carrying, lifting, sleeping, and reach over head  PARTICIPATION LIMITATIONS: cleaning, laundry, and community activity  PERSONAL FACTORS: Past/current experiences, Time since onset of injury/illness/exacerbation, and 3+ comorbidities: GBS, Raynaud's, OA, asthma   are also affecting patient's functional outcome.   REHAB POTENTIAL: Good  CLINICAL DECISION MAKING: Evolving/moderate complexity  EVALUATION COMPLEXITY: Moderate   GOALS: Goals reviewed with patient? Yes  SHORT TERM GOALS: Target date:  09/07/2023  Patient will be independent with initial HEP. Baseline:  Goal status: INITIAL  2.  Patient will improve cervical and shoulder A/ROM to [facility] to allow her to perform various household tasks. Baseline:  Goal status: INITIAL   LONG TERM GOALS: Target date: 10/12/2023  Patient will be independent with advanced HEP to allow her self-progression after discharge. Baseline:  Goal status: INITIAL  2.  Patient will increase her functional UE strength to [facility] to allow her to lift items and place in overhead shelves and to lift suitcases. Baseline:  Goal status: INITIAL  3.  Patient will be able to participate in previous exercise classes without increased pain. Baseline:  Goal status: INITIAL  4.  Patient will increase FOTO to 57 to demonstrate improvements in functional tasks. Baseline: 41 Goal status: INITIAL    PLAN:  PT FREQUENCY: 2x/week  PT DURATION: 8 weeks  PLANNED INTERVENTIONS: 97164- PT Re-evaluation, 97110-Therapeutic exercises, 97530- Therapeutic activity, 97112- Neuromuscular re-education, 97535- Self Care, 13086- Manual therapy, [phone]- Canalith repositioning, [id number]- Aquatic Therapy, 97014- Electrical stimulation (unattended), [id number]- Electrical stimulation (manual), [id number]- Vasopneumatic device, [id number]- Ultrasound, [id number]- Traction (mechanical), [id number]- Ionotophoresis 4mg /ml Dexamethasone, Patient/Family education, Balance training, Taping, Dry Needling, Joint mobilization, Joint manipulation, Spinal manipulation, Spinal mobilization, Vestibular training, Cryotherapy, and Moist heat  PLAN FOR NEXT SESSION: Assess and progress HEP as indicated, strengthening, ROM/flexibility, postural stabilization   Reather Laurence, PT, DPT 08/22/23, 1:36 PM  [facility] [address] [place], [place] [address] Phone # [phone] Fax [phone]

## 2023-08-23 ENCOUNTER — Ambulatory Visit: Payer: Medicare HMO

## 2023-08-23 ENCOUNTER — Ambulatory Visit: Payer: Medicare HMO | Admitting: Podiatry

## 2023-08-23 ENCOUNTER — Encounter: Payer: Self-pay | Admitting: Podiatry

## 2023-08-23 DIAGNOSIS — M25511 Pain in right shoulder: Secondary | ICD-10-CM

## 2023-08-23 DIAGNOSIS — L600 Ingrowing nail: Secondary | ICD-10-CM

## 2023-08-23 DIAGNOSIS — M6281 Muscle weakness (generalized): Secondary | ICD-10-CM

## 2023-08-23 DIAGNOSIS — M542 Cervicalgia: Secondary | ICD-10-CM | POA: Diagnosis not present

## 2023-08-23 DIAGNOSIS — R252 Cramp and spasm: Secondary | ICD-10-CM

## 2023-08-23 DIAGNOSIS — R293 Abnormal posture: Secondary | ICD-10-CM

## 2023-08-23 DIAGNOSIS — M25512 Pain in left shoulder: Secondary | ICD-10-CM | POA: Diagnosis not present

## 2023-08-23 DIAGNOSIS — G8929 Other chronic pain: Secondary | ICD-10-CM | POA: Diagnosis not present

## 2023-08-23 NOTE — Telephone Encounter (Signed)
Pt is requesting a callback to f/u. She stated a nurse was supposed to contact her but she hadn't heard anything. Please advise

## 2023-08-23 NOTE — Therapy (Signed)
OUTPATIENT PHYSICAL THERAPY CERVICAL TREATMENT   Patient Name: Brianna Gates MRN: [id number] DOB:1951/04/28, 72 y.o., female Today's Date: 08/24/2023  END OF SESSION:  PT End of Session - 08/23/23 1621     Visit Number 2    Date for PT Re-Evaluation 10/12/23    Authorization Type [employer]    Progress Note Due on Visit 10    PT Start Time 1615    PT Stop Time 1700    PT Time Calculation (min) 45 min    Activity Tolerance Patient tolerated treatment well    Behavior During Therapy [facility] for tasks assessed/performed             Past Medical History:  Diagnosis Date   Allergy    Anxiety    Asthma    INFREQUENT PROBLEM - rarely used inhaler   CAD (coronary artery disease) 06/29/2023   CCTA 06/27/23: CAC score 15.7 (45th percentile), mild nonobstructive CAD [mid LAD 1-24, proximal LCx 1-24]    Cervical spondylosis    Cervical strain 07/22/2014   Chest pain of uncertain etiology 12/13/2021   Complication of anesthesia    UNKNOWN REACTION WITH RASH AT SITE OF IV only with knee surgery   Degenerative arthritis    Dyslipidemia    Eczema    GERD (gastroesophageal reflux disease)    Guillain-Barre syndrome (HCC)    History of Guillain-Barre syndrome 07/22/2014   Hyperlipidemia    Hypertension    Hypothyroidism    Lumbosacral spondylosis    Palpitations 12/12/2021   Pure hypercholesterolemia 12/13/2021   Raynaud disease    Thyroid nodule    Past Surgical History:  Procedure Laterality Date   ABDOMINAL HYSTERECTOMY     COLONOSCOPY     ESOPHAGOGASTRODUODENOSCOPY (EGD) WITH PROPOFOL N/A 09/13/2021   Procedure: ESOPHAGOGASTRODUODENOSCOPY (EGD) WITH PROPOFOL;  Surgeon: Hilarie Fredrickson, MD;  Location: [facility];  Service: Endoscopy;  Laterality: N/A;   FOOT SURGERY Left    GANGLION CYST EXCISION Left    Wrist   HERNIA REPAIR     KNEE ARTHROSCOPY     RT KNEE   KNEE ARTHROTOMY Right 08/27/2013   Procedure: RIGHT KNEE SCAR EXCISION AND FEMORAL REVISION;   Surgeon: Loanne Drilling, MD;  Location: [facility];  Service: Orthopedics;  Laterality: Right;   REPLACEMENT TOTAL KNEE Right    2014, 2021   REPLACEMENT TOTAL KNEE BILATERAL Bilateral    STRABISMUS SURGERY Right    TONSILLECTOMY     vaginal vulvo prolapse  09/16/2015   Patient Active Problem List   Diagnosis Date Noted   CAD (coronary artery disease) 06/29/2023   Paresthesia 10/18/2022   Onychomycosis 01/29/2022   Chest pain of uncertain etiology 12/13/2021   Pure hypercholesterolemia 12/13/2021   Palpitations 12/12/2021   Esophageal dysphagia    Allergic rhinitis 04/04/2021   Allergic rhinitis due to animal (cat) (dog) hair and dander 04/04/2021   Allergic rhinitis due to pollen 04/04/2021   Hormone deficiency 04/04/2021   Syncope and collapse 12/27/2020   Hypotension 12/27/2020   Bradycardia 12/27/2020   History of COVID-19 12/27/2020   Preoperative evaluation to rule out surgical contraindication 07/16/2020   Arthrofibrosis of knee joint, right 11/26/2019   Arthritis of knee 07/30/2019   Presence of right artificial knee joint 07/30/2019   Sleep apnea 10/16/2018   Impingement syndrome of left shoulder region 01/15/2018   Gastroesophageal reflux disease 02/02/2016   Sensorineural hearing loss (SNHL), bilateral 02/02/2016   Vaginal vault prolapse after hysterectomy 07/28/2015  History of Guillain-Barre syndrome 07/22/2014   Cervical strain 07/22/2014   Postoperative stiffness of total knee replacement (HCC) 08/27/2013   Abnormality of gait 08/22/2012   Cervical spondylosis without myelopathy 08/22/2012   Muscle weakness (generalized) 08/22/2012   Guillain-Barre syndrome (HCC) 08/22/2012   Essential hypertension 08/22/2012   Strabismus 08/22/2012   Degenerative joint disease of knee, left 08/22/2012   Hypothyroidism 08/22/2012   Raynaud's disease 08/22/2012   Knee joint replacement by other means 08/22/2012    PCP: Chilton Greathouse, MD  REFERRING PROVIDER: Rodolph Bong, MD  REFERRING DIAG: M25.511,G89.29,M25.512 (ICD-10-CM) - Chronic pain of both shoulders M54.2,G89.29 (ICD-10-CM) - Neck pain, chronic  THERAPY DIAG:  Cervicalgia  Cramp and spasm  Muscle weakness (generalized)  Bilateral shoulder pain, unspecified chronicity  Abnormal posture  Rationale for Evaluation and Treatment: Rehabilitation  ONSET DATE: Has had pain for years, but it has gotten worse in past 2-3 months   SUBJECTIVE:                                                                                                                                                                                                         SUBJECTIVE STATEMENT: Patient states that she hasn't really had a lot of relief but is doing her HEP daily.     Hand dominance: Right  PERTINENT HISTORY:  Bilateral TKA (with 2 revisions on right knee), HTN, Raynaud's disease, CAD, hard of hearing (wears hearing aide), asthma, OA  PAIN:  Are you having pain? Yes: NPRS scale: 3-7/10 Pain location: cervical and bilat shoulder Pain description: "just there" Aggravating factors: sleeping, overhead reaching Relieving factors: sometimes medication  PRECAUTIONS: None  RED FLAGS: None     WEIGHT BEARING RESTRICTIONS: No  FALLS:  Has patient fallen in last 6 months?  Has not had a fall, but has tripped recently  LIVING ENVIRONMENT: Lives with: lives with their spouse Lives in: House/apartment Stairs:  one level home with upstairs bonus room above the garage Has following equipment at home: Single point cane, Environmental consultant - 2 wheeled, and Grab bars  OCCUPATION: Retired  [employer]: Independent and Leisure: outdoors, water aerobics, exercise, spending time with family/friends  PATIENT GOALS: To be stronger to be able to lift items around home, like the mattress to change the sheets easier.  NEXT MD VISIT: Dr Denyse Amass on December 9th  OBJECTIVE:  Note: Objective measures were completed at Evaluation unless  otherwise noted.  DIAGNOSTIC FINDINGS:  Per notes from Dr Denyse Amass on 08/13/2023: X-ray images cervical spine and bilateral shoulders obtained today personally and independently  interpreted.   Cervical spine: Multilevel DDD.  No acute fractures are visible.   Right shoulder: Limited humeral head possible subacromial impingement.  No acute fractures.  No severe glenohumeral DJD.   Left shoulder: AC DJD.  No acute fractures are visible.  No severe glenohumeral DJD.  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  At times, has numbness and tingling in hands, as well.  POSTURE: rounded shoulders, forward head, and increased thoracic kyphosis  PALPATION: Pt with tight musculature noted in bilateral cervical region   CERVICAL ROM:   Active ROM A/PROM (deg) eval  Flexion 55  Extension 40  Right lateral flexion 20  Left lateral flexion 20  Right rotation 50  Left rotation 45   (Blank rows = not tested)  UPPER EXTREMITY ROM:  Active ROM Right eval Left eval  Shoulder flexion 135 130  Shoulder extension 70 65  Shoulder abduction 137 130   (Blank rows = not tested)  UPPER EXTREMITY MMT:  Eval:   Bilateral shoulder strength grossly 4 to 4+/5 throughout  FUNCTIONAL TESTS:  Eval:   5 times sit to stand: 11.24 sec  TODAY'S TREATMENT:                                                                                                                              DATE: 08/24/2023 Reviewed HEP Added pulleys and located a set on [employer] for patient to order (flexion x 2 min) Added shoulder and scapular stabilization exercises Prone shoulder ext, rows, and horizontal abd with 1 lb x 20 each direction (left only but encouraged patient to do both at home) Side lying ER (left) x 20 with 1 lb Supine serratus punch with 1 lb x 20 Trigger Point  Dry-Needling  Treatment instructions: Expect mild to moderate muscle soreness. S/S of pneumothorax if dry needled over a lung field, and to seek immediate medical attention should they occur. Patient verbalized understanding of these instructions and education. Patient Consent Given: Yes Education handout provided: Yes Muscles treated: bilateral upper traps Electrical stimulation performed: No Parameters: N/A Treatment response/outcome: Skilled palpation used to identify taut bands and trigger points.  Once identified, dry needling techniques used to treat these areas.  Twitch response ellicited along with palpable elongation of muscle.  Following treatment, patient reported that she still had some neck pain but that her shoulders felt more relaxed.      DATE: 08/22/2023 Reviewed HEP and role of PT   PATIENT EDUCATION:  Education details: Issued HEP Person educated: Patient Education method: Explanation, Demonstration, and Handouts Education comprehension: verbalized understanding  HOME EXERCISE PROGRAM: Access Code: [email or web address] URL: [email or web address] Date: 08/23/2023 Prepared by: Mikey Kirschner  Exercises - Seated Scapular Retraction  - 1 x daily - 7 x weekly - 2 sets - 10 reps - Seated Cervical Rotation AROM  -  1 x daily - 7 x weekly - 2 sets - 10 reps - Seated Cervical Sidebending AROM  - 1 x daily - 7 x weekly - 2 sets - 10 reps - Seated Cervical Retraction  - 1 x daily - 7 x weekly - 2 sets - 10 reps - Prone Shoulder Extension - Single Arm  - 2 x daily - 7 x weekly - 2 sets - 10 reps - Prone Shoulder Row  - 2 x daily - 7 x weekly - 2 sets - 10 reps - Prone Single Arm Shoulder Horizontal Abduction with Scapular Retraction and Palm Down  - 2 x daily - 7 x weekly - 2 sets - 10 reps - Sidelying Shoulder External Rotation  - 2 x daily - 7 x weekly - 2 sets - 10 reps - Single Arm Serratus Punches in Supine with Dumbbell  - 2 x daily - 7 x weekly - 2 sets - 10  reps  ASSESSMENT:  CLINICAL IMPRESSION: Added shoulder strengthening and scapular stabilization along with pulleys and dry needling.  Achieved good twitch response in left upper trap and general deep ache in right upper trap.  She would likely benefit from DN to sub occipitals and cervical paraspinals as well.  Patient would benefit from skilled PT to progress her towards her goals of increased activity without increased pain.  OBJECTIVE IMPAIRMENTS: decreased ROM, decreased strength, increased muscle spasms, impaired flexibility, impaired UE functional use, improper body mechanics, postural dysfunction, and pain.   ACTIVITY LIMITATIONS: carrying, lifting, sleeping, and reach over head  PARTICIPATION LIMITATIONS: cleaning, laundry, and community activity  PERSONAL FACTORS: Past/current experiences, Time since onset of injury/illness/exacerbation, and 3+ comorbidities: GBS, Raynaud's, OA, asthma   are also affecting patient's functional outcome.   REHAB POTENTIAL: Good  CLINICAL DECISION MAKING: Evolving/moderate complexity  EVALUATION COMPLEXITY: Moderate   GOALS: Goals reviewed with patient? Yes  SHORT TERM GOALS: Target date: 09/07/2023  Patient will be independent with initial HEP. Baseline:  Goal status: INITIAL  2.  Patient will improve cervical and shoulder A/ROM to [facility] to allow her to perform various household tasks. Baseline:  Goal status: INITIAL   LONG TERM GOALS: Target date: 10/12/2023  Patient will be independent with advanced HEP to allow her self-progression after discharge. Baseline:  Goal status: INITIAL  2.  Patient will increase her functional UE strength to [facility] to allow her to lift items and place in overhead shelves and to lift suitcases. Baseline:  Goal status: INITIAL  3.  Patient will be able to participate in previous exercise classes without increased pain. Baseline:  Goal status: INITIAL  4.  Patient will increase FOTO to 57 to demonstrate  improvements in functional tasks. Baseline: 41 Goal status: INITIAL    PLAN:  PT FREQUENCY: 2x/week  PT DURATION: 8 weeks  PLANNED INTERVENTIONS: 97164- PT Re-evaluation, 97110-Therapeutic exercises, 97530- Therapeutic activity, 97112- Neuromuscular re-education, 97535- Self Care, 64403- Manual therapy, [id number]- Canalith repositioning, [id number]- Aquatic Therapy, 97014- Electrical stimulation (unattended), [id number]- Electrical stimulation (manual), [id number]- Vasopneumatic device, [id number]- Ultrasound, [id number]- Traction (mechanical), [id number]- Ionotophoresis 4mg /ml Dexamethasone, Patient/Family education, Balance training, Taping, Dry Needling, Joint mobilization, Joint manipulation, Spinal manipulation, Spinal mobilization, Vestibular training, Cryotherapy, and Moist heat  PLAN FOR NEXT SESSION: DN to sub occipitals and c spine if good response to DN #1.  Assess and progress HEP as indicated, strengthening, ROM/flexibility, postural stabilization   Ingeborg Fite B. Raydon Chappuis, PT 08/24/23 7:35 AM [facility] [address] [place], [place] [address]  Phone # [phone] Fax [phone]

## 2023-08-24 NOTE — Telephone Encounter (Signed)
I spoke with the pt and made her an appt for 10/2023 when she was due.

## 2023-08-27 DIAGNOSIS — J3089 Other allergic rhinitis: Secondary | ICD-10-CM | POA: Diagnosis not present

## 2023-08-27 DIAGNOSIS — M25561 Pain in right knee: Secondary | ICD-10-CM | POA: Diagnosis not present

## 2023-08-27 DIAGNOSIS — Z1331 Encounter for screening for depression: Secondary | ICD-10-CM | POA: Diagnosis not present

## 2023-08-27 DIAGNOSIS — E785 Hyperlipidemia, unspecified: Secondary | ICD-10-CM | POA: Diagnosis not present

## 2023-08-27 DIAGNOSIS — G629 Polyneuropathy, unspecified: Secondary | ICD-10-CM | POA: Diagnosis not present

## 2023-08-27 DIAGNOSIS — I10 Essential (primary) hypertension: Secondary | ICD-10-CM | POA: Diagnosis not present

## 2023-08-27 DIAGNOSIS — Z0000 Encounter for general adult medical examination without abnormal findings: Secondary | ICD-10-CM | POA: Diagnosis not present

## 2023-08-27 DIAGNOSIS — M3500 Sicca syndrome, unspecified: Secondary | ICD-10-CM | POA: Diagnosis not present

## 2023-08-27 DIAGNOSIS — Z1339 Encounter for screening examination for other mental health and behavioral disorders: Secondary | ICD-10-CM | POA: Diagnosis not present

## 2023-08-27 DIAGNOSIS — E039 Hypothyroidism, unspecified: Secondary | ICD-10-CM | POA: Diagnosis not present

## 2023-08-27 DIAGNOSIS — J3081 Allergic rhinitis due to animal (cat) (dog) hair and dander: Secondary | ICD-10-CM | POA: Diagnosis not present

## 2023-08-27 DIAGNOSIS — J301 Allergic rhinitis due to pollen: Secondary | ICD-10-CM | POA: Diagnosis not present

## 2023-08-27 DIAGNOSIS — G610 Guillain-Barre syndrome: Secondary | ICD-10-CM | POA: Diagnosis not present

## 2023-08-27 DIAGNOSIS — I7300 Raynaud's syndrome without gangrene: Secondary | ICD-10-CM | POA: Diagnosis not present

## 2023-08-27 DIAGNOSIS — Z23 Encounter for immunization: Secondary | ICD-10-CM | POA: Diagnosis not present

## 2023-08-28 ENCOUNTER — Encounter (HOSPITAL_BASED_OUTPATIENT_CLINIC_OR_DEPARTMENT_OTHER): Payer: Self-pay

## 2023-08-28 ENCOUNTER — Ambulatory Visit: Payer: Medicare HMO

## 2023-08-28 ENCOUNTER — Ambulatory Visit: Payer: Medicare HMO | Admitting: Podiatry

## 2023-08-28 DIAGNOSIS — M25511 Pain in right shoulder: Secondary | ICD-10-CM

## 2023-08-28 DIAGNOSIS — M542 Cervicalgia: Secondary | ICD-10-CM

## 2023-08-28 DIAGNOSIS — R293 Abnormal posture: Secondary | ICD-10-CM

## 2023-08-28 DIAGNOSIS — M6281 Muscle weakness (generalized): Secondary | ICD-10-CM

## 2023-08-28 DIAGNOSIS — R252 Cramp and spasm: Secondary | ICD-10-CM

## 2023-08-28 DIAGNOSIS — M25512 Pain in left shoulder: Secondary | ICD-10-CM | POA: Diagnosis not present

## 2023-08-28 DIAGNOSIS — G8929 Other chronic pain: Secondary | ICD-10-CM | POA: Diagnosis not present

## 2023-08-28 NOTE — Therapy (Signed)
OUTPATIENT PHYSICAL THERAPY CERVICAL TREATMENT   Patient Name: Brianna Gates MRN: [id number] DOB:[birth year], 72 y.o., female Today's Date: 08/28/2023  END OF SESSION:  PT End of Session - 08/28/23 1149     Visit Number 3    Date for PT Re-Evaluation 10/12/23    Authorization Type [employer]    Progress Note Due on Visit 10    PT Start Time 1149    PT Stop Time 1230    PT Time Calculation (min) 41 min    Activity Tolerance Patient tolerated treatment well    Behavior During Therapy [facility] for tasks assessed/performed             Past Medical History:  Diagnosis Date   Allergy    Anxiety    Asthma    INFREQUENT PROBLEM - rarely used inhaler   CAD (coronary artery disease) 06/29/2023   CCTA 06/27/23: CAC score 15.7 (45th percentile), mild nonobstructive CAD [mid LAD 1-24, proximal LCx 1-24]    Cervical spondylosis    Cervical strain 07/22/2014   Chest pain of uncertain etiology 12/13/2021   Complication of anesthesia    UNKNOWN REACTION WITH RASH AT SITE OF IV only with knee surgery   Degenerative arthritis    Dyslipidemia    Eczema    GERD (gastroesophageal reflux disease)    Guillain-Barre syndrome (HCC)    History of Guillain-Barre syndrome 07/22/2014   Hyperlipidemia    Hypertension    Hypothyroidism    Lumbosacral spondylosis    Palpitations 12/12/2021   Pure hypercholesterolemia 12/13/2021   Raynaud disease    Thyroid nodule    Past Surgical History:  Procedure Laterality Date   ABDOMINAL HYSTERECTOMY     COLONOSCOPY     ESOPHAGOGASTRODUODENOSCOPY (EGD) WITH PROPOFOL N/A 09/13/2021   Procedure: ESOPHAGOGASTRODUODENOSCOPY (EGD) WITH PROPOFOL;  Surgeon: Hilarie Fredrickson, MD;  Location: [facility];  Service: Endoscopy;  Laterality: N/A;   FOOT SURGERY Left    GANGLION CYST EXCISION Left    Wrist   HERNIA REPAIR     KNEE ARTHROSCOPY     RT KNEE   KNEE ARTHROTOMY Right 08/27/2013   Procedure: RIGHT KNEE SCAR EXCISION AND FEMORAL REVISION;   Surgeon: Loanne Drilling, MD;  Location: [facility];  Service: Orthopedics;  Laterality: Right;   REPLACEMENT TOTAL KNEE Right    2014, 2021   REPLACEMENT TOTAL KNEE BILATERAL Bilateral    STRABISMUS SURGERY Right    TONSILLECTOMY     vaginal vulvo prolapse  09/16/2015   Patient Active Problem List   Diagnosis Date Noted   CAD (coronary artery disease) 06/29/2023   Paresthesia 10/18/2022   Onychomycosis 01/29/2022   Chest pain of uncertain etiology 12/13/2021   Pure hypercholesterolemia 12/13/2021   Palpitations 12/12/2021   Esophageal dysphagia    Allergic rhinitis 04/04/2021   Allergic rhinitis due to animal (cat) (dog) hair and dander 04/04/2021   Allergic rhinitis due to pollen 04/04/2021   Hormone deficiency 04/04/2021   Syncope and collapse 12/27/2020   Hypotension 12/27/2020   Bradycardia 12/27/2020   History of COVID-19 12/27/2020   Preoperative evaluation to rule out surgical contraindication 07/16/2020   Arthrofibrosis of knee joint, right 11/26/2019   Arthritis of knee 07/30/2019   Presence of right artificial knee joint 07/30/2019   Sleep apnea 10/16/2018   Impingement syndrome of left shoulder region 01/15/2018   Gastroesophageal reflux disease 02/02/2016   Sensorineural hearing loss (SNHL), bilateral 02/02/2016   Vaginal vault prolapse after hysterectomy 07/28/2015  History of Guillain-Barre syndrome 07/22/2014   Cervical strain 07/22/2014   Postoperative stiffness of total knee replacement (HCC) 08/27/2013   Abnormality of gait 08/22/2012   Cervical spondylosis without myelopathy 08/22/2012   Muscle weakness (generalized) 08/22/2012   Guillain-Barre syndrome (HCC) 08/22/2012   Essential hypertension 08/22/2012   Strabismus 08/22/2012   Degenerative joint disease of knee, left 08/22/2012   Hypothyroidism 08/22/2012   Raynaud's disease 08/22/2012   Knee joint replacement by other means 08/22/2012    PCP: Chilton Greathouse, MD  REFERRING PROVIDER: Rodolph Bong, MD  REFERRING DIAG: M25.511,G89.29,M25.512 (ICD-10-CM) - Chronic pain of both shoulders M54.2,G89.29 (ICD-10-CM) - Neck pain, chronic  THERAPY DIAG:  Cervicalgia  Cramp and spasm  Muscle weakness (generalized)  Bilateral shoulder pain, unspecified chronicity  Abnormal posture  Rationale for Evaluation and Treatment: Rehabilitation  ONSET DATE: Has had pain for years, but it has gotten worse in past 2-3 months   SUBJECTIVE:                                                                                                                                                                                                         SUBJECTIVE STATEMENT: Patient states that she is confused on some of her exercises.  The DN helped a little on the left.  Patient had a lot of questions before we got started, leaving only 30 min for treatment.       Hand dominance: Right  PERTINENT HISTORY:  Bilateral TKA (with 2 revisions on right knee), HTN, Raynaud's disease, CAD, hard of hearing (wears hearing aide), asthma, OA  PAIN:  Are you having pain? Yes: NPRS scale: 3-7/10 Pain location: cervical and bilat shoulder Pain description: "just there" Aggravating factors: sleeping, overhead reaching Relieving factors: sometimes medication  PRECAUTIONS: None  RED FLAGS: None     WEIGHT BEARING RESTRICTIONS: No  FALLS:  Has patient fallen in last 6 months?  Has not had a fall, but has tripped recently  LIVING ENVIRONMENT: Lives with: lives with their spouse Lives in: House/apartment Stairs:  one level home with upstairs bonus room above the garage Has following equipment at home: Single point cane, Environmental consultant - 2 wheeled, and Grab bars  OCCUPATION: Retired  [employer]: Independent and Leisure: outdoors, water aerobics, exercise, spending time with family/friends  PATIENT GOALS: To be stronger to be able to lift items around home, like the mattress to change the sheets easier.  NEXT MD VISIT:  Dr Denyse Amass on December 9th  OBJECTIVE:  Note: Objective measures were completed at Evaluation unless otherwise noted.  DIAGNOSTIC FINDINGS:  Per notes from Dr Denyse Amass on 08/13/2023: X-ray images cervical spine and bilateral shoulders obtained today personally and independently interpreted.   Cervical spine: Multilevel DDD.  No acute fractures are visible.   Right shoulder: Limited humeral head possible subacromial impingement.  No acute fractures.  No severe glenohumeral DJD.   Left shoulder: AC DJD.  No acute fractures are visible.  No severe glenohumeral DJD.  PATIENT SURVEYS:  Eval: FOTO 41 (projected 12 by visit 13)  COGNITION: Overall cognitive status: Within functional limits for tasks assessed  SENSATION: Reports numbness in feet from GBS.  At times, has numbness and tingling in hands, as well.  POSTURE: rounded shoulders, forward head, and increased thoracic kyphosis  PALPATION: Pt with tight musculature noted in bilateral cervical region   CERVICAL ROM:   Active ROM A/PROM (deg) eval  Flexion 55  Extension 40  Right lateral flexion 20  Left lateral flexion 20  Right rotation 50  Left rotation 45   (Blank rows = not tested)  UPPER EXTREMITY ROM:  Active ROM Right eval Left eval  Shoulder flexion 135 130  Shoulder extension 70 65  Shoulder abduction 137 130   (Blank rows = not tested)  UPPER EXTREMITY MMT:  Eval:   Bilateral shoulder strength grossly 4 to 4+/5 throughout  FUNCTIONAL TESTS:  Eval:   5 times sit to stand: 11.24 sec  TODAY'S TREATMENT:                                                                                                                              DATE: 08/28/2023 UBE x 4 min (instructed to do 2 fwd and 2 backward but she went fwd for the whole 4 min 3 way scapular stabilization with green loop x 5 each side  4 D ball rolls x 10 each direction on each arm (light blue plyo ball) Prone shoulder ext, rows, and horizontal abd  with 2 lb x 20 each direction left only due to time constraints Side lying ER (left) x 20 with 2 lb left  Supine serratus punch with 2 lb x 20 left Trigger Point Dry-Needling  Treatment instructions: Expect mild to moderate muscle soreness. S/S of pneumothorax if dry needled over a lung field, and to seek immediate medical attention should they occur. Patient verbalized understanding of these instructions and education. Patient Consent Given: Yes Education handout provided: Yes Muscles treated: bilateral upper traps, cervical multifidi, sub-occipitals bilaterally Electrical stimulation performed: No Parameters: N/A Treatment response/outcome: Skilled palpation used to identify taut bands and trigger points.  Once identified, dry needling techniques used to treat these areas.  Twitch response ellicited along with palpable elongation of muscle.  Following treatment, patient reported mild soreness.   DATE: 08/24/2023 Reviewed HEP Added pulleys and located a set on [employer] for patient to order (flexion x 2 min) Added shoulder and scapular stabilization exercises Prone shoulder ext, rows, and horizontal abd with 1 lb x 20  each direction (left only but encouraged patient to do both at home) Side lying ER (left) x 20 with 1 lb Supine serratus punch with 1 lb x 20 Trigger Point Dry-Needling  Treatment instructions: Expect mild to moderate muscle soreness. S/S of pneumothorax if dry needled over a lung field, and to seek immediate medical attention should they occur. Patient verbalized understanding of these instructions and education. Patient Consent Given: Yes Education handout provided: Yes Muscles treated: bilateral upper traps Electrical stimulation performed: No Parameters: N/A Treatment response/outcome: Skilled palpation used to identify taut bands and trigger points.  Once identified, dry needling techniques used to treat these areas.  Twitch response ellicited along with palpable elongation  of muscle.  Following treatment, patient reported that she still had some neck pain but that her shoulders felt more relaxed.      DATE: 08/22/2023 Reviewed HEP and role of PT   PATIENT EDUCATION:  Education details: Issued HEP Person educated: Patient Education method: Explanation, Demonstration, and Handouts Education comprehension: verbalized understanding  HOME EXERCISE PROGRAM: Access Code: [email or web address] URL: [email or web address] Date: 08/23/2023 Prepared by: Mikey Kirschner  Exercises - Seated Scapular Retraction  - 1 x daily - 7 x weekly - 2 sets - 10 reps - Seated Cervical Rotation AROM  - 1 x daily - 7 x weekly - 2 sets - 10 reps - Seated Cervical Sidebending AROM  - 1 x daily - 7 x weekly - 2 sets - 10 reps - Seated Cervical Retraction  - 1 x daily - 7 x weekly - 2 sets - 10 reps - Prone Shoulder Extension - Single Arm  - 2 x daily - 7 x weekly - 2 sets - 10 reps - Prone Shoulder Row  - 2 x daily - 7 x weekly - 2 sets - 10 reps - Prone Single Arm Shoulder Horizontal Abduction with Scapular Retraction and Palm Down  - 2 x daily - 7 x weekly - 2 sets - 10 reps - Sidelying Shoulder External Rotation  - 2 x daily - 7 x weekly - 2 sets - 10 reps - Single Arm Serratus Punches in Supine with Dumbbell  - 2 x daily - 7 x weekly - 2 sets - 10 reps  ASSESSMENT:  CLINICAL IMPRESSION: Reviewed and progressed to 2 lbs on all shoulder strengthening exercises.  Added scapular stabilization exercises.  Patient continues to fatigue significantly with left shoulder side lying ER.  We achieved good twitch response in right upper trap and general deep ache in left upper trap today, which was opposite from last visit.  She would likely continue to benefit from DN, postural and shoulder strengthening.  Patient would benefit from skilled PT to progress her towards her goals of increased activity without increased pain.  OBJECTIVE IMPAIRMENTS: decreased ROM, decreased strength, increased  muscle spasms, impaired flexibility, impaired UE functional use, improper body mechanics, postural dysfunction, and pain.   ACTIVITY LIMITATIONS: carrying, lifting, sleeping, and reach over head  PARTICIPATION LIMITATIONS: cleaning, laundry, and community activity  PERSONAL FACTORS: Past/current experiences, Time since onset of injury/illness/exacerbation, and 3+ comorbidities: GBS, Raynaud's, OA, asthma   are also affecting patient's functional outcome.   REHAB POTENTIAL: Good  CLINICAL DECISION MAKING: Evolving/moderate complexity  EVALUATION COMPLEXITY: Moderate   GOALS: Goals reviewed with patient? Yes  SHORT TERM GOALS: Target date: 09/07/2023  Patient will be independent with initial HEP. Baseline:  Goal status: INITIAL  2.  Patient will improve cervical and shoulder A/ROM to [facility] to allow her to  perform various household tasks. Baseline:  Goal status: INITIAL   LONG TERM GOALS: Target date: 10/12/2023  Patient will be independent with advanced HEP to allow her self-progression after discharge. Baseline:  Goal status: INITIAL  2.  Patient will increase her functional UE strength to [facility] to allow her to lift items and place in overhead shelves and to lift suitcases. Baseline:  Goal status: INITIAL  3.  Patient will be able to participate in previous exercise classes without increased pain. Baseline:  Goal status: INITIAL  4.  Patient will increase FOTO to 57 to demonstrate improvements in functional tasks. Baseline: 41 Goal status: INITIAL    PLAN:  PT FREQUENCY: 2x/week  PT DURATION: 8 weeks  PLANNED INTERVENTIONS: 97164- PT Re-evaluation, 97110-Therapeutic exercises, 97530- Therapeutic activity, 97112- Neuromuscular re-education, 97535- Self Care, 16109- Manual therapy, [id number]- Canalith repositioning, [id number]- Aquatic Therapy, 97014- Electrical stimulation (unattended), [id number]- Electrical stimulation (manual), [id number]- Vasopneumatic device, [id number]- Ultrasound,  [id number]- Traction (mechanical), [id number]- Ionotophoresis 4mg /ml Dexamethasone, Patient/Family education, Balance training, Taping, Dry Needling, Joint mobilization, Joint manipulation, Spinal manipulation, Spinal mobilization, Vestibular training, Cryotherapy, and Moist heat  PLAN FOR NEXT SESSION: Continue postural and shoulder strengthening.  DN once per week.  Assess and progress HEP as indicated, strengthening, ROM/flexibility, postural stabilization   Chanon Loney B. Tilmon Wisehart, PT 08/28/23 12:37 PM [facility] [address], [address] [place], [place] [address] Phone # [phone] Fax [phone]

## 2023-08-29 NOTE — Progress Notes (Signed)
Cervical spine x-ray shows some arthritis.

## 2023-08-29 NOTE — Progress Notes (Signed)
Subjective: Chief Complaint  Patient presents with   Nail Problem    Bilateral big toe issues painful at times    72 year old female presents the office today for follow-up evaluation of ingrown toenail on both big toes.  She has some discoloration of the corner of the toenail which had a pedicure.  Alert dried blood.  No drainage or pus.  No swelling or redness.  Objective: AAO x3, NAD DP/PT pulses palpable bilaterally, CRT less than 3 seconds Ingrown toenails noted along the distal aspect of bilateral hallux toenails on both medial, lateral nail borders.  There is no drainage or pus and there is no edema, erythema or signs of infection.  There are no open lesions noted today.  Looks that there is some slight dried blood present in the corner of the nail but no active bleeding or any signs of infection today. No pain with calf compression, swelling, warmth, erythema  Assessment: Ingrown toenail  Plan: -All treatment options discussed with the patient including all alternatives, risks, complications.  -I did sharply debride the corners of the ingrown toenail without any complications or bleeding today.  She would like to hold off on partial nail avulsion but symptoms worsen we will reevaluate.  Vivi Barrack DPM

## 2023-08-29 NOTE — Progress Notes (Signed)
Right shoulder x-ray shows a little bit of arthritis.

## 2023-08-29 NOTE — Progress Notes (Signed)
Left shoulder x-ray looks okay to radiology.  There is a little bit of arthritis.

## 2023-08-30 ENCOUNTER — Ambulatory Visit: Payer: Medicare HMO | Admitting: Rehabilitative and Restorative Service Providers"

## 2023-08-30 ENCOUNTER — Encounter: Payer: Self-pay | Admitting: Rehabilitative and Restorative Service Providers"

## 2023-08-30 DIAGNOSIS — M25512 Pain in left shoulder: Secondary | ICD-10-CM

## 2023-08-30 DIAGNOSIS — M25511 Pain in right shoulder: Secondary | ICD-10-CM | POA: Diagnosis not present

## 2023-08-30 DIAGNOSIS — M542 Cervicalgia: Secondary | ICD-10-CM

## 2023-08-30 DIAGNOSIS — M6281 Muscle weakness (generalized): Secondary | ICD-10-CM

## 2023-08-30 DIAGNOSIS — R293 Abnormal posture: Secondary | ICD-10-CM

## 2023-08-30 DIAGNOSIS — R252 Cramp and spasm: Secondary | ICD-10-CM

## 2023-08-30 DIAGNOSIS — G8929 Other chronic pain: Secondary | ICD-10-CM | POA: Diagnosis not present

## 2023-08-30 NOTE — Therapy (Signed)
OUTPATIENT PHYSICAL THERAPY CERVICAL TREATMENT   Patient Name: Brianna Gates MRN: [id number] DOB:December 17, 1950, 72 y.o., female Today's Date: 08/30/2023  END OF SESSION:  PT End of Session - 08/30/23 1236     Visit Number 4    Date for PT Re-Evaluation 10/12/23    Authorization Type [employer]    Progress Note Due on Visit 10    PT Start Time 1234    PT Stop Time 1315    PT Time Calculation (min) 41 min    Activity Tolerance Patient tolerated treatment well    Behavior During Therapy [facility] for tasks assessed/performed             Past Medical History:  Diagnosis Date   Allergy    Anxiety    Asthma    INFREQUENT PROBLEM - rarely used inhaler   CAD (coronary artery disease) 06/29/2023   CCTA 06/27/23: CAC score 15.7 (45th percentile), mild nonobstructive CAD [mid LAD 1-24, proximal LCx 1-24]    Cervical spondylosis    Cervical strain 07/22/2014   Chest pain of uncertain etiology 12/13/2021   Complication of anesthesia    UNKNOWN REACTION WITH RASH AT SITE OF IV only with knee surgery   Degenerative arthritis    Dyslipidemia    Eczema    GERD (gastroesophageal reflux disease)    Guillain-Barre syndrome (HCC)    History of Guillain-Barre syndrome 07/22/2014   Hyperlipidemia    Hypertension    Hypothyroidism    Lumbosacral spondylosis    Palpitations 12/12/2021   Pure hypercholesterolemia 12/13/2021   Raynaud disease    Thyroid nodule    Past Surgical History:  Procedure Laterality Date   ABDOMINAL HYSTERECTOMY     COLONOSCOPY     ESOPHAGOGASTRODUODENOSCOPY (EGD) WITH PROPOFOL N/A 09/13/2021   Procedure: ESOPHAGOGASTRODUODENOSCOPY (EGD) WITH PROPOFOL;  Surgeon: Hilarie Fredrickson, MD;  Location: [facility];  Service: Endoscopy;  Laterality: N/A;   FOOT SURGERY Left    GANGLION CYST EXCISION Left    Wrist   HERNIA REPAIR     KNEE ARTHROSCOPY     RT KNEE   KNEE ARTHROTOMY Right 08/27/2013   Procedure: RIGHT KNEE SCAR EXCISION AND FEMORAL REVISION;   Surgeon: Loanne Drilling, MD;  Location: [facility];  Service: Orthopedics;  Laterality: Right;   REPLACEMENT TOTAL KNEE Right    2014, 2021   REPLACEMENT TOTAL KNEE BILATERAL Bilateral    STRABISMUS SURGERY Right    TONSILLECTOMY     vaginal vulvo prolapse  09/16/2015   Patient Active Problem List   Diagnosis Date Noted   CAD (coronary artery disease) 06/29/2023   Paresthesia 10/18/2022   Onychomycosis 01/29/2022   Chest pain of uncertain etiology 12/13/2021   Pure hypercholesterolemia 12/13/2021   Palpitations 12/12/2021   Esophageal dysphagia    Allergic rhinitis 04/04/2021   Allergic rhinitis due to animal (cat) (dog) hair and dander 04/04/2021   Allergic rhinitis due to pollen 04/04/2021   Hormone deficiency 04/04/2021   Syncope and collapse 12/27/2020   Hypotension 12/27/2020   Bradycardia 12/27/2020   History of COVID-19 12/27/2020   Preoperative evaluation to rule out surgical contraindication 07/16/2020   Arthrofibrosis of knee joint, right 11/26/2019   Arthritis of knee 07/30/2019   Presence of right artificial knee joint 07/30/2019   Sleep apnea 10/16/2018   Impingement syndrome of left shoulder region 01/15/2018   Gastroesophageal reflux disease 02/02/2016   Sensorineural hearing loss (SNHL), bilateral 02/02/2016   Vaginal vault prolapse after hysterectomy 07/28/2015  History of Guillain-Barre syndrome 07/22/2014   Cervical strain 07/22/2014   Postoperative stiffness of total knee replacement (HCC) 08/27/2013   Abnormality of gait 08/22/2012   Cervical spondylosis without myelopathy 08/22/2012   Muscle weakness (generalized) 08/22/2012   Guillain-Barre syndrome (HCC) 08/22/2012   Essential hypertension 08/22/2012   Strabismus 08/22/2012   Degenerative joint disease of knee, left 08/22/2012   Hypothyroidism 08/22/2012   Raynaud's disease 08/22/2012   Knee joint replacement by other means 08/22/2012    PCP: Chilton Greathouse, MD  REFERRING PROVIDER: Rodolph Bong, MD  REFERRING DIAG: M25.511,G89.29,M25.512 (ICD-10-CM) - Chronic pain of both shoulders M54.2,G89.29 (ICD-10-CM) - Neck pain, chronic  THERAPY DIAG:  Cervicalgia  Cramp and spasm  Muscle weakness (generalized)  Bilateral shoulder pain, unspecified chronicity  Abnormal posture  Rationale for Evaluation and Treatment: Rehabilitation  ONSET DATE: Has had pain for years, but it has gotten worse in past 2-3 months   SUBJECTIVE:                                                                                                                                                                                                         SUBJECTIVE STATEMENT: Patient states that she is having increased pain since dry needling.  Hand dominance: Right  PERTINENT HISTORY:  Bilateral TKA (with 2 revisions on right knee), HTN, Raynaud's disease, CAD, hard of hearing (wears hearing aide), asthma, OA  PAIN:  Are you having pain? Yes: NPRS scale: 4/10 Pain location: cervical and bilat shoulder Pain description: "just there" Aggravating factors: sleeping, overhead reaching Relieving factors: sometimes medication  PRECAUTIONS: None  RED FLAGS: None     WEIGHT BEARING RESTRICTIONS: No  FALLS:  Has patient fallen in last 6 months?  Has not had a fall, but has tripped recently  LIVING ENVIRONMENT: Lives with: lives with their spouse Lives in: House/apartment Stairs:  one level home with upstairs bonus room above the garage Has following equipment at home: Single point cane, Environmental consultant - 2 wheeled, and Grab bars  OCCUPATION: Retired  [employer]: Independent and Leisure: outdoors, water aerobics, exercise, spending time with family/friends  PATIENT GOALS: To be stronger to be able to lift items around home, like the mattress to change the sheets easier.  NEXT MD VISIT: Dr Denyse Amass on December 9th  OBJECTIVE:  Note: Objective measures were completed at Evaluation unless otherwise  noted.  DIAGNOSTIC FINDINGS:  Cervical Radiograph on 08/13/2023: IMPRESSION: No acute osseous abnormality. Mild midcervical spine predominant degenerative changes. Assessment of the odontoid view is markedly limited due to technique.  Left  shoulder radiograph on 08/13/2023: FINDINGS: There is no evidence of fracture or dislocation. Mild degenerative changes of the [facility] joint. There is no other focal bone abnormality. Soft tissues are unremarkable.  Right shoulder radiograph on 08/13/2023: FINDINGS: Degenerative spurring at the acromioclavicular joint which is narrow. Mild spurring at the [facility] joint. No acute fracture, bone lesion, or subluxation.   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  At times, has numbness and tingling in hands, as well.  POSTURE: rounded shoulders, forward head, and increased thoracic kyphosis  PALPATION: Pt with tight musculature noted in bilateral cervical region   CERVICAL ROM:   Active ROM A/PROM (deg) eval  Flexion 55  Extension 40  Right lateral flexion 20  Left lateral flexion 20  Right rotation 50  Left rotation 45   (Blank rows = not tested)  UPPER EXTREMITY ROM:  Active ROM Right eval Left eval  Shoulder flexion 135 130  Shoulder extension 70 65  Shoulder abduction 137 130   (Blank rows = not tested)  UPPER EXTREMITY MMT:  Eval:   Bilateral shoulder strength grossly 4 to 4+/5 throughout  FUNCTIONAL TESTS:  Eval:   5 times sit to stand: 11.24 sec  TODAY'S TREATMENT:                                                                                                                               DATE: 08/30/2023 UBE level 1.0 x2 min each direction with PT present to discuss status Shoulder pulleys for flexion and abduction x1 min each Supine cervical retraction 2x10 Supine scapular  retraction/shoulder press 2x10 Supine serratus punch with 2# 2x10 bilat Supine hand tracing of alphabet with 2# in hand A-Z bilat Supine MELT method with head on soft foam roll:  x20 horizontal rotations x20 head nods Supine shoulder flexion with 2# dumbbells 2x10 bilat   DATE: 08/28/2023 UBE x 4 min (instructed to do 2 fwd and 2 backward but she went fwd for the whole 4 min 3 way scapular stabilization with green loop x 5 each side  4 D ball rolls x 10 each direction on each arm (light blue plyo ball) Prone shoulder ext, rows, and horizontal abd with 2 lb x 20 each direction left only due to time constraints Side lying ER (left) x 20 with 2 lb left  Supine serratus punch with 2 lb x 20 left Trigger Point Dry-Needling  Treatment instructions: Expect mild to moderate muscle soreness. S/S of pneumothorax if dry needled over a lung field, and to seek immediate medical attention should they occur. Patient verbalized understanding of these instructions and education. Patient Consent Given: Yes Education handout provided: Yes Muscles treated: bilateral upper traps, cervical multifidi, sub-occipitals bilaterally Electrical stimulation performed: No Parameters: N/A Treatment response/outcome: Skilled palpation used to identify taut bands and trigger points.  Once identified, dry needling techniques used to treat these areas.  Twitch response ellicited along with palpable elongation of muscle.  Following treatment, patient reported mild soreness.   DATE: 08/24/2023 Reviewed HEP Added pulleys and located a set on [employer] for patient to order (flexion x 2 min) Added shoulder and scapular stabilization exercises Prone shoulder ext, rows, and horizontal abd with 1 lb x 20 each direction (left only but encouraged patient to do both at home) Side lying ER (left) x 20 with 1 lb Supine serratus punch with 1 lb x 20 Trigger Point Dry-Needling  Treatment instructions: Expect mild to moderate muscle  soreness. S/S of pneumothorax if dry needled over a lung field, and to seek immediate medical attention should they occur. Patient verbalized understanding of these instructions and education. Patient Consent Given: Yes Education handout provided: Yes Muscles treated: bilateral upper traps Electrical stimulation performed: No Parameters: N/A Treatment response/outcome: Skilled palpation used to identify taut bands and trigger points.  Once identified, dry needling techniques used to treat these areas.  Twitch response ellicited along with palpable elongation of muscle.  Following treatment, patient reported that she still had some neck pain but that her shoulders felt more relaxed.      PATIENT EDUCATION:  Education details: Issued HEP Person educated: Patient Education method: Explanation, Facilities manager, and Handouts Education comprehension: verbalized understanding  HOME EXERCISE PROGRAM: Access Code: [email or web address] URL: [email or web address] Date: 08/23/2023 Prepared by: Mikey Kirschner  Exercises - Seated Scapular Retraction  - 1 x daily - 7 x weekly - 2 sets - 10 reps - Seated Cervical Rotation AROM  - 1 x daily - 7 x weekly - 2 sets - 10 reps - Seated Cervical Sidebending AROM  - 1 x daily - 7 x weekly - 2 sets - 10 reps - Seated Cervical Retraction  - 1 x daily - 7 x weekly - 2 sets - 10 reps - Prone Shoulder Extension - Single Arm  - 2 x daily - 7 x weekly - 2 sets - 10 reps - Prone Shoulder Row  - 2 x daily - 7 x weekly - 2 sets - 10 reps - Prone Single Arm Shoulder Horizontal Abduction with Scapular Retraction and Palm Down  - 2 x daily - 7 x weekly - 2 sets - 10 reps - Sidelying Shoulder External Rotation  - 2 x daily - 7 x weekly - 2 sets - 10 reps - Single Arm Serratus Punches in Supine with Dumbbell  - 2 x daily - 7 x weekly - 2 sets - 10 reps  ASSESSMENT:  CLINICAL IMPRESSION: Ms Mittelsteadt presents to skilled PT reporting some increased pain since dry needling  last session.  Pt progressed with scapular and postural strengthening during session.  Patient required education throughout on the importance of slowing down and allowing for improved technique and muscle activation.  Patient reported muscle fatigue at end of session.   OBJECTIVE IMPAIRMENTS: decreased ROM, decreased strength, increased muscle spasms, impaired flexibility, impaired UE functional use, improper body mechanics, postural dysfunction, and pain.   ACTIVITY LIMITATIONS: carrying, lifting, sleeping, and reach over head  PARTICIPATION LIMITATIONS: cleaning, laundry, and community activity  PERSONAL FACTORS: Past/current experiences, Time since onset of injury/illness/exacerbation, and 3+ comorbidities: GBS, Raynaud's, OA, asthma   are also affecting patient's functional outcome.   REHAB POTENTIAL: Good  CLINICAL DECISION MAKING: Evolving/moderate complexity  EVALUATION COMPLEXITY: Moderate   GOALS: Goals reviewed with patient? Yes  SHORT TERM GOALS: Target date: 09/07/2023  Patient will  be independent with initial HEP. Baseline:  Goal status: Ongoing  2.  Patient will improve cervical and shoulder A/ROM to [facility] to allow her to perform various household tasks. Baseline:  Goal status: Ongoing   LONG TERM GOALS: Target date: 10/12/2023  Patient will be independent with advanced HEP to allow her self-progression after discharge. Baseline:  Goal status: INITIAL  2.  Patient will increase her functional UE strength to [facility] to allow her to lift items and place in overhead shelves and to lift suitcases. Baseline:  Goal status: INITIAL  3.  Patient will be able to participate in previous exercise classes without increased pain. Baseline:  Goal status: INITIAL  4.  Patient will increase FOTO to 57 to demonstrate improvements in functional tasks. Baseline: 41 Goal status: INITIAL    PLAN:  PT FREQUENCY: 2x/week  PT DURATION: 8 weeks  PLANNED INTERVENTIONS: [id number]-  PT Re-evaluation, 97110-Therapeutic exercises, 97530- Therapeutic activity, 97112- Neuromuscular re-education, 97535- Self Care, 08657- Manual therapy, [phone]- Canalith repositioning, [id number]- Aquatic Therapy, 97014- Electrical stimulation (unattended), [id number]- Electrical stimulation (manual), [id number]- Vasopneumatic device, [id number]- Ultrasound, [id number]- Traction (mechanical), [id number]- Ionotophoresis 4mg /ml Dexamethasone, Patient/Family education, Balance training, Taping, Dry Needling, Joint mobilization, Joint manipulation, Spinal manipulation, Spinal mobilization, Vestibular training, Cryotherapy, and Moist heat  PLAN FOR NEXT SESSION: Assess and progress HEP as indicated, strengthening, ROM/flexibility, postural stabilization   Reather Laurence, PT, DPT 08/30/23, 1:26 PM  [facility] [address] [place], [place] [address] Phone # [phone] Fax [phone]

## 2023-09-03 DIAGNOSIS — J3081 Allergic rhinitis due to animal (cat) (dog) hair and dander: Secondary | ICD-10-CM | POA: Diagnosis not present

## 2023-09-03 DIAGNOSIS — J3089 Other allergic rhinitis: Secondary | ICD-10-CM | POA: Diagnosis not present

## 2023-09-03 DIAGNOSIS — J301 Allergic rhinitis due to pollen: Secondary | ICD-10-CM | POA: Diagnosis not present

## 2023-09-04 ENCOUNTER — Ambulatory Visit: Payer: Medicare HMO | Admitting: Rehabilitative and Restorative Service Providers"

## 2023-09-04 ENCOUNTER — Encounter: Payer: Self-pay | Admitting: Rehabilitative and Restorative Service Providers"

## 2023-09-04 DIAGNOSIS — M6281 Muscle weakness (generalized): Secondary | ICD-10-CM

## 2023-09-04 DIAGNOSIS — G8929 Other chronic pain: Secondary | ICD-10-CM | POA: Diagnosis not present

## 2023-09-04 DIAGNOSIS — M25511 Pain in right shoulder: Secondary | ICD-10-CM | POA: Diagnosis not present

## 2023-09-04 DIAGNOSIS — R252 Cramp and spasm: Secondary | ICD-10-CM

## 2023-09-04 DIAGNOSIS — M542 Cervicalgia: Secondary | ICD-10-CM | POA: Diagnosis not present

## 2023-09-04 DIAGNOSIS — M25512 Pain in left shoulder: Secondary | ICD-10-CM | POA: Diagnosis not present

## 2023-09-04 NOTE — Therapy (Signed)
OUTPATIENT PHYSICAL THERAPY TREATMENT NOTE   Patient Name: Brianna Gates MRN: [id number] DOB:10-Sep-1951, 72 y.o., female Today's Date: 09/04/2023  END OF SESSION:  PT End of Session - 09/04/23 1116     Visit Number 5    Date for PT Re-Evaluation 10/12/23    Authorization Type [employer]    Progress Note Due on Visit 10    PT Start Time 1112   Pt arrived late secondary to traffic   PT Stop Time 1145    PT Time Calculation (min) 33 min    Activity Tolerance Patient tolerated treatment well    Behavior During Therapy [facility] for tasks assessed/performed             Past Medical History:  Diagnosis Date   Allergy    Anxiety    Asthma    INFREQUENT PROBLEM - rarely used inhaler   CAD (coronary artery disease) 06/29/2023   CCTA 06/27/23: CAC score 15.7 (45th percentile), mild nonobstructive CAD [mid LAD 1-24, proximal LCx 1-24]    Cervical spondylosis    Cervical strain 07/22/2014   Chest pain of uncertain etiology 12/13/2021   Complication of anesthesia    UNKNOWN REACTION WITH RASH AT SITE OF IV only with knee surgery   Degenerative arthritis    Dyslipidemia    Eczema    GERD (gastroesophageal reflux disease)    Guillain-Barre syndrome (HCC)    History of Guillain-Barre syndrome 07/22/2014   Hyperlipidemia    Hypertension    Hypothyroidism    Lumbosacral spondylosis    Palpitations 12/12/2021   Pure hypercholesterolemia 12/13/2021   Raynaud disease    Thyroid nodule    Past Surgical History:  Procedure Laterality Date   ABDOMINAL HYSTERECTOMY     COLONOSCOPY     ESOPHAGOGASTRODUODENOSCOPY (EGD) WITH PROPOFOL N/A 09/13/2021   Procedure: ESOPHAGOGASTRODUODENOSCOPY (EGD) WITH PROPOFOL;  Surgeon: Hilarie Fredrickson, MD;  Location: [facility];  Service: Endoscopy;  Laterality: N/A;   FOOT SURGERY Left    GANGLION CYST EXCISION Left    Wrist   HERNIA REPAIR     KNEE ARTHROSCOPY     RT KNEE   KNEE ARTHROTOMY Right 08/27/2013   Procedure: RIGHT KNEE SCAR  EXCISION AND FEMORAL REVISION;  Surgeon: Loanne Drilling, MD;  Location: [facility];  Service: Orthopedics;  Laterality: Right;   REPLACEMENT TOTAL KNEE Right    2014, 2021   REPLACEMENT TOTAL KNEE BILATERAL Bilateral    STRABISMUS SURGERY Right    TONSILLECTOMY     vaginal vulvo prolapse  09/16/2015   Patient Active Problem List   Diagnosis Date Noted   CAD (coronary artery disease) 06/29/2023   Paresthesia 10/18/2022   Onychomycosis 01/29/2022   Chest pain of uncertain etiology 12/13/2021   Pure hypercholesterolemia 12/13/2021   Palpitations 12/12/2021   Esophageal dysphagia    Allergic rhinitis 04/04/2021   Allergic rhinitis due to animal (cat) (dog) hair and dander 04/04/2021   Allergic rhinitis due to pollen 04/04/2021   Hormone deficiency 04/04/2021   Syncope and collapse 12/27/2020   Hypotension 12/27/2020   Bradycardia 12/27/2020   History of COVID-19 12/27/2020   Preoperative evaluation to rule out surgical contraindication 07/16/2020   Arthrofibrosis of knee joint, right 11/26/2019   Arthritis of knee 07/30/2019   Presence of right artificial knee joint 07/30/2019   Sleep apnea 10/16/2018   Impingement syndrome of left shoulder region 01/15/2018   Gastroesophageal reflux disease 02/02/2016   Sensorineural hearing loss (SNHL), bilateral 02/02/2016  Vaginal vault prolapse after hysterectomy 07/28/2015   History of Guillain-Barre syndrome 07/22/2014   Cervical strain 07/22/2014   Postoperative stiffness of total knee replacement (HCC) 08/27/2013   Abnormality of gait 08/22/2012   Cervical spondylosis without myelopathy 08/22/2012   Muscle weakness (generalized) 08/22/2012   Guillain-Barre syndrome (HCC) 08/22/2012   Essential hypertension 08/22/2012   Strabismus 08/22/2012   Degenerative joint disease of knee, left 08/22/2012   Hypothyroidism 08/22/2012   Raynaud's disease 08/22/2012   Knee joint replacement by other means 08/22/2012    PCP: Chilton Greathouse,  MD  REFERRING PROVIDER: Rodolph Bong, MD  REFERRING DIAG: M25.511,G89.29,M25.512 (ICD-10-CM) - Chronic pain of both shoulders M54.2,G89.29 (ICD-10-CM) - Neck pain, chronic  THERAPY DIAG:  Cervicalgia  Cramp and spasm  Muscle weakness (generalized)  Bilateral shoulder pain, unspecified chronicity  Rationale for Evaluation and Treatment: Rehabilitation  ONSET DATE: Has had pain for years, but it has gotten worse in past 2-3 months   SUBJECTIVE:                                                                                                                                                                                                         SUBJECTIVE STATEMENT: Patient brought in her pulleys from home to practice with them.  Pt is reporting a slight increase in pain this morning.  Hand dominance: Right  PERTINENT HISTORY:  Bilateral TKA (with 2 revisions on right knee), HTN, Raynaud's disease, CAD, hard of hearing (wears hearing aide), asthma, OA  PAIN:  Are you having pain? Yes: NPRS scale: 5/10 Pain location: cervical and bilat shoulder Pain description: "just there" Aggravating factors: sleeping, overhead reaching Relieving factors: sometimes medication  PRECAUTIONS: None  RED FLAGS: None     WEIGHT BEARING RESTRICTIONS: No  FALLS:  Has patient fallen in last 6 months?  Has not had a fall, but has tripped recently  LIVING ENVIRONMENT: Lives with: lives with their spouse Lives in: House/apartment Stairs:  one level home with upstairs bonus room above the garage Has following equipment at home: Single point cane, Environmental consultant - 2 wheeled, and Grab bars  OCCUPATION: Retired  [employer]: Independent and Leisure: outdoors, water aerobics, exercise, spending time with family/friends  PATIENT GOALS: To be stronger to be able to lift items around home, like the mattress to change the sheets easier.  NEXT MD VISIT: Dr Denyse Amass on December 9th  OBJECTIVE:  Note: Objective  measures were completed at Evaluation unless otherwise noted.  DIAGNOSTIC FINDINGS:  Cervical Radiograph on 08/13/2023: IMPRESSION: No acute osseous abnormality. Mild midcervical spine  predominant degenerative changes. Assessment of the odontoid view is markedly limited due to technique.  Left shoulder radiograph on 08/13/2023: FINDINGS: There is no evidence of fracture or dislocation. Mild degenerative changes of the [facility] joint. There is no other focal bone abnormality. Soft tissues are unremarkable.  Right shoulder radiograph on 08/13/2023: FINDINGS: Degenerative spurring at the acromioclavicular joint which is narrow. Mild spurring at the [facility] joint. No acute fracture, bone lesion, or subluxation.   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  At times, has numbness and tingling in hands, as well.  POSTURE: rounded shoulders, forward head, and increased thoracic kyphosis  PALPATION: Pt with tight musculature noted in bilateral cervical region   CERVICAL ROM:   Active ROM A/PROM (deg) eval  Flexion 55  Extension 40  Right lateral flexion 20  Left lateral flexion 20  Right rotation 50  Left rotation 45   (Blank rows = not tested)  UPPER EXTREMITY ROM:  Active ROM Right eval Left eval  Shoulder flexion 135 130  Shoulder extension 70 65  Shoulder abduction 137 130   (Blank rows = not tested)  UPPER EXTREMITY MMT:  Eval:   Bilateral shoulder strength grossly 4 to 4+/5 throughout  FUNCTIONAL TESTS:  Eval:   5 times sit to stand: 11.24 sec  TODAY'S TREATMENT:                                                                                                                               DATE: 09/04/2023 Nustep level 4 x5 min with PT present to discuss status Shoulder pulleys for flexion and abduction x1 min each (adjusted  pulleys for home and utilized them today) Supine MELT method with head on soft foam roll:  x20 horizontal rotations x20 head nods Supine cervical retraction 2x10 Supine scapular retraction/shoulder press 2x10 Supine serratus punch with 2# 2x10 bilat Supine hand tracing of alphabet with 2# in hand A-Z bilat Supine shoulder flexion with 2# dumbbells 2x10 bilat    DATE: 08/30/2023 UBE level 1.0 x2 min each direction with PT present to discuss status Shoulder pulleys for flexion and abduction x1 min each Supine cervical retraction 2x10 Supine scapular retraction/shoulder press 2x10 Supine serratus punch with 2# 2x10 bilat Supine hand tracing of alphabet with 2# in hand A-Z bilat Supine MELT method with head on soft foam roll:  x20 horizontal rotations x20 head nods Supine shoulder flexion with 2# dumbbells 2x10 bilat   DATE: 08/28/2023 UBE x 4 min (instructed to do 2 fwd and 2 backward but she went fwd for the whole 4 min 3 way scapular stabilization with green loop x 5 each side  4 D ball rolls x 10 each direction on each arm (light blue plyo ball) Prone shoulder ext, rows, and horizontal abd with 2 lb x 20 each direction  left only due to time constraints Side lying ER (left) x 20 with 2 lb left  Supine serratus punch with 2 lb x 20 left Trigger Point Dry-Needling  Treatment instructions: Expect mild to moderate muscle soreness. S/S of pneumothorax if dry needled over a lung field, and to seek immediate medical attention should they occur. Patient verbalized understanding of these instructions and education. Patient Consent Given: Yes Education handout provided: Yes Muscles treated: bilateral upper traps, cervical multifidi, sub-occipitals bilaterally Electrical stimulation performed: No Parameters: N/A Treatment response/outcome: Skilled palpation used to identify taut bands and trigger points.  Once identified, dry needling techniques used to treat these areas.  Twitch response  ellicited along with palpable elongation of muscle.  Following treatment, patient reported mild soreness.    PATIENT EDUCATION:  Education details: Issued HEP Person educated: Patient Education method: Explanation, Facilities manager, and Handouts Education comprehension: verbalized understanding  HOME EXERCISE PROGRAM: Access Code: [email or web address] URL: [email or web address] Date: 08/23/2023 Prepared by: Mikey Kirschner  Exercises - Seated Scapular Retraction  - 1 x daily - 7 x weekly - 2 sets - 10 reps - Seated Cervical Rotation AROM  - 1 x daily - 7 x weekly - 2 sets - 10 reps - Seated Cervical Sidebending AROM  - 1 x daily - 7 x weekly - 2 sets - 10 reps - Seated Cervical Retraction  - 1 x daily - 7 x weekly - 2 sets - 10 reps - Prone Shoulder Extension - Single Arm  - 2 x daily - 7 x weekly - 2 sets - 10 reps - Prone Shoulder Row  - 2 x daily - 7 x weekly - 2 sets - 10 reps - Prone Single Arm Shoulder Horizontal Abduction with Scapular Retraction and Palm Down  - 2 x daily - 7 x weekly - 2 sets - 10 reps - Sidelying Shoulder External Rotation  - 2 x daily - 7 x weekly - 2 sets - 10 reps - Single Arm Serratus Punches in Supine with Dumbbell  - 2 x daily - 7 x weekly - 2 sets - 10 reps  ASSESSMENT:  CLINICAL IMPRESSION: Ms Pique presents to skilled PT reporting decreased pain since last session.  Patient continues to have increased pain in cervical and shoulder region, limiting her ROM.  Patient was able to properly demonstrate pulley use with her home pulleys and feels more confident about their use at home.  Patient continues to require cuing throughout for slower velocity for a more effective motion during exercise.  Patient continues to require skilled PT to progress towards goal related activities.  OBJECTIVE IMPAIRMENTS: decreased ROM, decreased strength, increased muscle spasms, impaired flexibility, impaired UE functional use, improper body mechanics, postural  dysfunction, and pain.   ACTIVITY LIMITATIONS: carrying, lifting, sleeping, and reach over head  PARTICIPATION LIMITATIONS: cleaning, laundry, and community activity  PERSONAL FACTORS: Past/current experiences, Time since onset of injury/illness/exacerbation, and 3+ comorbidities: GBS, Raynaud's, OA, asthma   are also affecting patient's functional outcome.   REHAB POTENTIAL: Good  CLINICAL DECISION MAKING: Evolving/moderate complexity  EVALUATION COMPLEXITY: Moderate   GOALS: Goals reviewed with patient? Yes  SHORT TERM GOALS: Target date: 09/07/2023  Patient will be independent with initial HEP. Baseline:  Goal status: Ongoing  2.  Patient will improve cervical and shoulder A/ROM to [facility] to allow her to perform various household tasks. Baseline:  Goal status: Ongoing   LONG TERM GOALS: Target date: 10/12/2023  Patient will be independent with advanced HEP to allow her self-progression  after discharge. Baseline:  Goal status: INITIAL  2.  Patient will increase her functional UE strength to [facility] to allow her to lift items and place in overhead shelves and to lift suitcases. Baseline:  Goal status: INITIAL  3.  Patient will be able to participate in previous exercise classes without increased pain. Baseline:  Goal status: INITIAL  4.  Patient will increase FOTO to 57 to demonstrate improvements in functional tasks. Baseline: 41 Goal status: INITIAL    PLAN:  PT FREQUENCY: 2x/week  PT DURATION: 8 weeks  PLANNED INTERVENTIONS: 97164- PT Re-evaluation, 97110-Therapeutic exercises, 97530- Therapeutic activity, 97112- Neuromuscular re-education, 97535- Self Care, 16109- Manual therapy, [phone]- Canalith repositioning, [id number]- Aquatic Therapy, 97014- Electrical stimulation (unattended), [id number]- Electrical stimulation (manual), [id number]- Vasopneumatic device, [id number]- Ultrasound, [id number]- Traction (mechanical), [id number]- Ionotophoresis 4mg /ml Dexamethasone, Patient/Family education,  Balance training, Taping, Dry Needling, Joint mobilization, Joint manipulation, Spinal manipulation, Spinal mobilization, Vestibular training, Cryotherapy, and Moist heat  PLAN FOR NEXT SESSION: Assess and progress HEP as indicated, strengthening, ROM/flexibility, postural stabilization   Reather Laurence, PT, DPT 09/04/23, 1:23 PM  [facility] [address] [place], [place] [address] Phone # [phone] Fax [phone]

## 2023-09-06 ENCOUNTER — Ambulatory Visit: Payer: Medicare HMO

## 2023-09-06 DIAGNOSIS — M542 Cervicalgia: Secondary | ICD-10-CM | POA: Diagnosis not present

## 2023-09-06 DIAGNOSIS — M25511 Pain in right shoulder: Secondary | ICD-10-CM | POA: Diagnosis not present

## 2023-09-06 DIAGNOSIS — R252 Cramp and spasm: Secondary | ICD-10-CM

## 2023-09-06 DIAGNOSIS — M6281 Muscle weakness (generalized): Secondary | ICD-10-CM

## 2023-09-06 DIAGNOSIS — M25512 Pain in left shoulder: Secondary | ICD-10-CM | POA: Diagnosis not present

## 2023-09-06 DIAGNOSIS — G8929 Other chronic pain: Secondary | ICD-10-CM | POA: Diagnosis not present

## 2023-09-06 DIAGNOSIS — R293 Abnormal posture: Secondary | ICD-10-CM

## 2023-09-06 NOTE — Therapy (Signed)
OUTPATIENT PHYSICAL THERAPY TREATMENT NOTE   Patient Name: Brianna Gates MRN: [id number] DOB:[birth year], 72 y.o., female Today's Date: 09/06/2023  END OF SESSION:  PT End of Session - 09/06/23 1411     Visit Number 6    Date for PT Re-Evaluation 10/12/23    Authorization Type [employer]    Progress Note Due on Visit 10    PT Start Time 1404    PT Stop Time 1445    PT Time Calculation (min) 41 min    Activity Tolerance Patient tolerated treatment well    Behavior During Therapy [facility] for tasks assessed/performed             Past Medical History:  Diagnosis Date   Allergy    Anxiety    Asthma    INFREQUENT PROBLEM - rarely used inhaler   CAD (coronary artery disease) 06/29/2023   CCTA 06/27/23: CAC score 15.7 (45th percentile), mild nonobstructive CAD [mid LAD 1-24, proximal LCx 1-24]    Cervical spondylosis    Cervical strain 07/22/2014   Chest pain of uncertain etiology 12/13/2021   Complication of anesthesia    UNKNOWN REACTION WITH RASH AT SITE OF IV only with knee surgery   Degenerative arthritis    Dyslipidemia    Eczema    GERD (gastroesophageal reflux disease)    Guillain-Barre syndrome (HCC)    History of Guillain-Barre syndrome 07/22/2014   Hyperlipidemia    Hypertension    Hypothyroidism    Lumbosacral spondylosis    Palpitations 12/12/2021   Pure hypercholesterolemia 12/13/2021   Raynaud disease    Thyroid nodule    Past Surgical History:  Procedure Laterality Date   ABDOMINAL HYSTERECTOMY     COLONOSCOPY     ESOPHAGOGASTRODUODENOSCOPY (EGD) WITH PROPOFOL N/A 09/13/2021   Procedure: ESOPHAGOGASTRODUODENOSCOPY (EGD) WITH PROPOFOL;  Surgeon: Hilarie Fredrickson, MD;  Location: [facility];  Service: Endoscopy;  Laterality: N/A;   FOOT SURGERY Left    GANGLION CYST EXCISION Left    Wrist   HERNIA REPAIR     KNEE ARTHROSCOPY     RT KNEE   KNEE ARTHROTOMY Right 08/27/2013   Procedure: RIGHT KNEE SCAR EXCISION AND FEMORAL REVISION;   Surgeon: Loanne Drilling, MD;  Location: [facility];  Service: Orthopedics;  Laterality: Right;   REPLACEMENT TOTAL KNEE Right    2014, 2021   REPLACEMENT TOTAL KNEE BILATERAL Bilateral    STRABISMUS SURGERY Right    TONSILLECTOMY     vaginal vulvo prolapse  09/16/2015   Patient Active Problem List   Diagnosis Date Noted   CAD (coronary artery disease) 06/29/2023   Paresthesia 10/18/2022   Onychomycosis 01/29/2022   Chest pain of uncertain etiology 12/13/2021   Pure hypercholesterolemia 12/13/2021   Palpitations 12/12/2021   Esophageal dysphagia    Allergic rhinitis 04/04/2021   Allergic rhinitis due to animal (cat) (dog) hair and dander 04/04/2021   Allergic rhinitis due to pollen 04/04/2021   Hormone deficiency 04/04/2021   Syncope and collapse 12/27/2020   Hypotension 12/27/2020   Bradycardia 12/27/2020   History of COVID-19 12/27/2020   Preoperative evaluation to rule out surgical contraindication 07/16/2020   Arthrofibrosis of knee joint, right 11/26/2019   Arthritis of knee 07/30/2019   Presence of right artificial knee joint 07/30/2019   Sleep apnea 10/16/2018   Impingement syndrome of left shoulder region 01/15/2018   Gastroesophageal reflux disease 02/02/2016   Sensorineural hearing loss (SNHL), bilateral 02/02/2016   Vaginal vault prolapse after hysterectomy 07/28/2015  History of Guillain-Barre syndrome 07/22/2014   Cervical strain 07/22/2014   Postoperative stiffness of total knee replacement (HCC) 08/27/2013   Abnormality of gait 08/22/2012   Cervical spondylosis without myelopathy 08/22/2012   Muscle weakness (generalized) 08/22/2012   Guillain-Barre syndrome (HCC) 08/22/2012   Essential hypertension 08/22/2012   Strabismus 08/22/2012   Degenerative joint disease of knee, left 08/22/2012   Hypothyroidism 08/22/2012   Raynaud's disease 08/22/2012   Knee joint replacement by other means 08/22/2012    PCP: Chilton Greathouse, MD  REFERRING PROVIDER: Rodolph Bong, MD  REFERRING DIAG: M25.511,G89.29,M25.512 (ICD-10-CM) - Chronic pain of both shoulders M54.2,G89.29 (ICD-10-CM) - Neck pain, chronic  THERAPY DIAG:  Cervicalgia  Cramp and spasm  Muscle weakness (generalized)  Bilateral shoulder pain, unspecified chronicity  Abnormal posture  Rationale for Evaluation and Treatment: Rehabilitation  ONSET DATE: Has had pain for years, but it has gotten worse in past 2-3 months   SUBJECTIVE:                                                                                                                                                                                                         SUBJECTIVE STATEMENT: Patient states she is 50% better on the right side but the left side only 30%.  Hand dominance: Right  PERTINENT HISTORY:  Bilateral TKA (with 2 revisions on right knee), HTN, Raynaud's disease, CAD, hard of hearing (wears hearing aide), asthma, OA  PAIN:  Are you having pain? Yes: NPRS scale: 5/10 Pain location: cervical and bilat shoulder Pain description: "just there" Aggravating factors: sleeping, overhead reaching Relieving factors: sometimes medication  PRECAUTIONS: None  RED FLAGS: None     WEIGHT BEARING RESTRICTIONS: No  FALLS:  Has patient fallen in last 6 months?  Has not had a fall, but has tripped recently  LIVING ENVIRONMENT: Lives with: lives with their spouse Lives in: House/apartment Stairs:  one level home with upstairs bonus room above the garage Has following equipment at home: Single point cane, Environmental consultant - 2 wheeled, and Grab bars  OCCUPATION: Retired  [employer]: Independent and Leisure: outdoors, water aerobics, exercise, spending time with family/friends  PATIENT GOALS: To be stronger to be able to lift items around home, like the mattress to change the sheets easier.  NEXT MD VISIT: Dr Denyse Amass on December 9th  OBJECTIVE:  Note: Objective measures were completed at Evaluation unless otherwise  noted.  DIAGNOSTIC FINDINGS:  Cervical Radiograph on 08/13/2023: IMPRESSION: No acute osseous abnormality. Mild midcervical spine predominant degenerative changes. Assessment of the odontoid view is markedly limited  due to technique.  Left shoulder radiograph on 08/13/2023: FINDINGS: There is no evidence of fracture or dislocation. Mild degenerative changes of the [facility] joint. There is no other focal bone abnormality. Soft tissues are unremarkable.  Right shoulder radiograph on 08/13/2023: FINDINGS: Degenerative spurring at the acromioclavicular joint which is narrow. Mild spurring at the [facility] joint. No acute fracture, bone lesion, or subluxation.   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  At times, has numbness and tingling in hands, as well.  POSTURE: rounded shoulders, forward head, and increased thoracic kyphosis  PALPATION: Pt with tight musculature noted in bilateral cervical region   CERVICAL ROM:   Active ROM A/PROM (deg) eval  Flexion 55  Extension 40  Right lateral flexion 20  Left lateral flexion 20  Right rotation 50  Left rotation 45   (Blank rows = not tested)  UPPER EXTREMITY ROM:  Active ROM Right eval Left eval  Shoulder flexion 135 130  Shoulder extension 70 65  Shoulder abduction 137 130   (Blank rows = not tested)  UPPER EXTREMITY MMT:  Eval:   Bilateral shoulder strength grossly 4 to 4+/5 throughout  FUNCTIONAL TESTS:  Eval:   5 times sit to stand: 11.24 sec  TODAY'S TREATMENT:                                                                                                                              DATE: 09/06/2023 UBE level 1 x 6 min (3/3) with PT present to discuss status 3 way scapular stabilization with blue loop x 5 each side 4D ball rolls x 20 each direction on each UE Thread the  needle with thoracic rotation x 5 each side (from elbow) Prone shoulder extension, rows and horizontal abduction with 2 lbs 2 x 10 both Side lying shoulder ER x 20 with 2 lbs both Supine serratus punch with 2 lbs x 20  both   DATE: 09/04/2023 Nustep level 4 x5 min with PT present to discuss status Shoulder pulleys for flexion and abduction x1 min each (adjusted pulleys for home and utilized them today) Supine MELT method with head on soft foam roll:  x20 horizontal rotations x20 head nods Supine cervical retraction 2x10 Supine scapular retraction/shoulder press 2x10 Supine serratus punch with 2# 2x10 bilat Supine hand tracing of alphabet with 2# in hand A-Z bilat Supine shoulder flexion with 2# dumbbells 2x10 bilat  DATE: 08/30/2023 UBE level 1.0 x2 min each direction with PT present to discuss status Shoulder pulleys for flexion and abduction x1 min each Supine cervical retraction 2x10 Supine scapular retraction/shoulder press 2x10 Supine serratus punch with 2# 2x10 bilat Supine hand tracing of alphabet with 2# in hand A-Z bilat Supine MELT method with head on soft foam roll:  x20 horizontal rotations x20 head nods Supine shoulder flexion with  2# dumbbells 2x10 bilat      PATIENT EDUCATION:  Education details: Issued HEP Person educated: Patient Education method: Explanation, Demonstration, and Handouts Education comprehension: verbalized understanding  HOME EXERCISE PROGRAM: Access Code: [email or web address] URL: [email or web address] Date: 08/23/2023 Prepared by: Mikey Kirschner  Exercises - Seated Scapular Retraction  - 1 x daily - 7 x weekly - 2 sets - 10 reps - Seated Cervical Rotation AROM  - 1 x daily - 7 x weekly - 2 sets - 10 reps - Seated Cervical Sidebending AROM  - 1 x daily - 7 x weekly - 2 sets - 10 reps - Seated Cervical Retraction  - 1 x daily - 7 x weekly - 2 sets - 10 reps - Prone Shoulder Extension - Single Arm  - 2 x daily - 7 x weekly - 2 sets -  10 reps - Prone Shoulder Row  - 2 x daily - 7 x weekly - 2 sets - 10 reps - Prone Single Arm Shoulder Horizontal Abduction with Scapular Retraction and Palm Down  - 2 x daily - 7 x weekly - 2 sets - 10 reps - Sidelying Shoulder External Rotation  - 2 x daily - 7 x weekly - 2 sets - 10 reps - Single Arm Serratus Punches in Supine with Dumbbell  - 2 x daily - 7 x weekly - 2 sets - 10 reps  ASSESSMENT:  CLINICAL IMPRESSION: Ms Ravelo is showing steady progress with postural strengthening and shoulder ROM.  She continues to need reminders about relaxing the shoulders away from her ears.  She tolerated 2 lbs of resistance on all shoulder exercises today.  We attempted thread the needle with thoracic rotation.  She was having some wrist pain and we had to modify down to quadruped on elbows.  She was able to do these more effectively in this position.    Patient continues to require skilled PT to progress towards goal related activities.  OBJECTIVE IMPAIRMENTS: decreased ROM, decreased strength, increased muscle spasms, impaired flexibility, impaired UE functional use, improper body mechanics, postural dysfunction, and pain.   ACTIVITY LIMITATIONS: carrying, lifting, sleeping, and reach over head  PARTICIPATION LIMITATIONS: cleaning, laundry, and community activity  PERSONAL FACTORS: Past/current experiences, Time since onset of injury/illness/exacerbation, and 3+ comorbidities: GBS, Raynaud's, OA, asthma   are also affecting patient's functional outcome.   REHAB POTENTIAL: Good  CLINICAL DECISION MAKING: Evolving/moderate complexity  EVALUATION COMPLEXITY: Moderate   GOALS: Goals reviewed with patient? Yes  SHORT TERM GOALS: Target date: 09/07/2023  Patient will be independent with initial HEP. Baseline:  Goal status: Ongoing  2.  Patient will improve cervical and shoulder A/ROM to [facility] to allow her to perform various household tasks. Baseline:  Goal status: Ongoing   LONG TERM  GOALS: Target date: 10/12/2023  Patient will be independent with advanced HEP to allow her self-progression after discharge. Baseline:  Goal status: INITIAL  2.  Patient will increase her functional UE strength to [facility] to allow her to lift items and place in overhead shelves and to lift suitcases. Baseline:  Goal status: INITIAL  3.  Patient will be able to participate in previous exercise classes without increased pain. Baseline:  Goal status: INITIAL  4.  Patient will increase FOTO to 57 to demonstrate improvements in functional tasks. Baseline: 41 Goal status: INITIAL    PLAN:  PT FREQUENCY: 2x/week  PT DURATION: 8 weeks  PLANNED INTERVENTIONS: 97164- PT Re-evaluation, 97110-Therapeutic exercises, 97530- Therapeutic activity, [id number]- Neuromuscular re-education, 97535- Self Care,  16109- Manual therapy, [id number]- Canalith repositioning, 60454- Aquatic Therapy, 97014- Electrical stimulation (unattended), [id number]- Electrical stimulation (manual), [id number]- Vasopneumatic device, [id number]- Ultrasound, [id number]- Traction (mechanical), [id number]- Ionotophoresis 4mg /ml Dexamethasone, Patient/Family education, Balance training, Taping, Dry Needling, Joint mobilization, Joint manipulation, Spinal manipulation, Spinal mobilization, Vestibular training, Cryotherapy, and Moist heat  PLAN FOR NEXT SESSION: Thoracic rotation exercises, shoulder depression exercises, assess and progress HEP as indicated, strengthening, ROM/flexibility, postural stabilization   Averil Digman B. Britnay Magnussen, PT 09/06/23 3:24 PM [facility] [address] [place], [place] [address] Phone # [phone] Fax [phone]

## 2023-09-10 ENCOUNTER — Ambulatory Visit: Payer: Medicare HMO

## 2023-09-10 DIAGNOSIS — M6281 Muscle weakness (generalized): Secondary | ICD-10-CM

## 2023-09-10 DIAGNOSIS — H25811 Combined forms of age-related cataract, right eye: Secondary | ICD-10-CM | POA: Diagnosis not present

## 2023-09-10 DIAGNOSIS — M25511 Pain in right shoulder: Secondary | ICD-10-CM

## 2023-09-10 DIAGNOSIS — R293 Abnormal posture: Secondary | ICD-10-CM

## 2023-09-10 DIAGNOSIS — M542 Cervicalgia: Secondary | ICD-10-CM

## 2023-09-10 DIAGNOSIS — H0102B Squamous blepharitis left eye, upper and lower eyelids: Secondary | ICD-10-CM | POA: Diagnosis not present

## 2023-09-10 DIAGNOSIS — H16223 Keratoconjunctivitis sicca, not specified as Sjogren's, bilateral: Secondary | ICD-10-CM | POA: Diagnosis not present

## 2023-09-10 DIAGNOSIS — M25512 Pain in left shoulder: Secondary | ICD-10-CM

## 2023-09-10 DIAGNOSIS — G8929 Other chronic pain: Secondary | ICD-10-CM | POA: Diagnosis not present

## 2023-09-10 DIAGNOSIS — R252 Cramp and spasm: Secondary | ICD-10-CM

## 2023-09-10 NOTE — Therapy (Signed)
OUTPATIENT PHYSICAL THERAPY TREATMENT NOTE   Patient Name: Brianna Gates MRN: [id number] DOB:October 12, 1951, 72 y.o., female Today's Date: 09/10/2023  END OF SESSION:  PT End of Session - 09/10/23 0804     Visit Number 7    Date for PT Re-Evaluation 10/12/23    Authorization Type [employer]    Progress Note Due on Visit 10    PT Start Time 0804    PT Stop Time 0845    PT Time Calculation (min) 41 min    Activity Tolerance Patient tolerated treatment well    Behavior During Therapy [facility] for tasks assessed/performed             Past Medical History:  Diagnosis Date   Allergy    Anxiety    Asthma    INFREQUENT PROBLEM - rarely used inhaler   CAD (coronary artery disease) 06/29/2023   CCTA 06/27/23: CAC score 15.7 (45th percentile), mild nonobstructive CAD [mid LAD 1-24, proximal LCx 1-24]    Cervical spondylosis    Cervical strain 07/22/2014   Chest pain of uncertain etiology 12/13/2021   Complication of anesthesia    UNKNOWN REACTION WITH RASH AT SITE OF IV only with knee surgery   Degenerative arthritis    Dyslipidemia    Eczema    GERD (gastroesophageal reflux disease)    Guillain-Barre syndrome (HCC)    History of Guillain-Barre syndrome 07/22/2014   Hyperlipidemia    Hypertension    Hypothyroidism    Lumbosacral spondylosis    Palpitations 12/12/2021   Pure hypercholesterolemia 12/13/2021   Raynaud disease    Thyroid nodule    Past Surgical History:  Procedure Laterality Date   ABDOMINAL HYSTERECTOMY     COLONOSCOPY     ESOPHAGOGASTRODUODENOSCOPY (EGD) WITH PROPOFOL N/A 09/13/2021   Procedure: ESOPHAGOGASTRODUODENOSCOPY (EGD) WITH PROPOFOL;  Surgeon: Hilarie Fredrickson, MD;  Location: [facility];  Service: Endoscopy;  Laterality: N/A;   FOOT SURGERY Left    GANGLION CYST EXCISION Left    Wrist   HERNIA REPAIR     KNEE ARTHROSCOPY     RT KNEE   KNEE ARTHROTOMY Right 08/27/2013   Procedure: RIGHT KNEE SCAR EXCISION AND FEMORAL REVISION;   Surgeon: Loanne Drilling, MD;  Location: [facility];  Service: Orthopedics;  Laterality: Right;   REPLACEMENT TOTAL KNEE Right    2014, 2021   REPLACEMENT TOTAL KNEE BILATERAL Bilateral    STRABISMUS SURGERY Right    TONSILLECTOMY     vaginal vulvo prolapse  09/16/2015   Patient Active Problem List   Diagnosis Date Noted   CAD (coronary artery disease) 06/29/2023   Paresthesia 10/18/2022   Onychomycosis 01/29/2022   Chest pain of uncertain etiology 12/13/2021   Pure hypercholesterolemia 12/13/2021   Palpitations 12/12/2021   Esophageal dysphagia    Allergic rhinitis 04/04/2021   Allergic rhinitis due to animal (cat) (dog) hair and dander 04/04/2021   Allergic rhinitis due to pollen 04/04/2021   Hormone deficiency 04/04/2021   Syncope and collapse 12/27/2020   Hypotension 12/27/2020   Bradycardia 12/27/2020   History of COVID-19 12/27/2020   Preoperative evaluation to rule out surgical contraindication 07/16/2020   Arthrofibrosis of knee joint, right 11/26/2019   Arthritis of knee 07/30/2019   Presence of right artificial knee joint 07/30/2019   Sleep apnea 10/16/2018   Impingement syndrome of left shoulder region 01/15/2018   Gastroesophageal reflux disease 02/02/2016   Sensorineural hearing loss (SNHL), bilateral 02/02/2016   Vaginal vault prolapse after hysterectomy 07/28/2015  History of Guillain-Barre syndrome 07/22/2014   Cervical strain 07/22/2014   Postoperative stiffness of total knee replacement (HCC) 08/27/2013   Abnormality of gait 08/22/2012   Cervical spondylosis without myelopathy 08/22/2012   Muscle weakness (generalized) 08/22/2012   Guillain-Barre syndrome (HCC) 08/22/2012   Essential hypertension 08/22/2012   Strabismus 08/22/2012   Degenerative joint disease of knee, left 08/22/2012   Hypothyroidism 08/22/2012   Raynaud's disease 08/22/2012   Knee joint replacement by other means 08/22/2012    PCP: Chilton Greathouse, MD  REFERRING PROVIDER: Rodolph Bong, MD  REFERRING DIAG: M25.511,G89.29,M25.512 (ICD-10-CM) - Chronic pain of both shoulders M54.2,G89.29 (ICD-10-CM) - Neck pain, chronic  THERAPY DIAG:  Cervicalgia  Cramp and spasm  Muscle weakness (generalized)  Bilateral shoulder pain, unspecified chronicity  Abnormal posture  Rationale for Evaluation and Treatment: Rehabilitation  ONSET DATE: Has had pain for years, but it has gotten worse in past 2-3 months   SUBJECTIVE:                                                                                                                                                                                                         SUBJECTIVE STATEMENT: Patient reports I am a little more sore.  I have this bump at the base of my neck.  Patient refers to C-7 prominens. Explained to patient that this is the most prominent vertebra and if she is in a more fwd head posture, this will seem more obvious.  She inquires if we have ever heard of people falling asleep while they read?  She thinks something may be wrong with her eyes.  She thinks this and watching the movie Wicked with her grandkids may have caused her neck to be sore.    Hand dominance: Right  PERTINENT HISTORY:  Bilateral TKA (with 2 revisions on right knee), HTN, Raynaud's disease, CAD, hard of hearing (wears hearing aide), asthma, OA  PAIN:   Are you having pain? Yes: NPRS scale: 5 neck, 5 right shoulder, 3 left shoulder/10 Pain location: cervical and bilat shoulder Pain description: "just there" Aggravating factors: sleeping, overhead reaching Relieving factors: sometimes medication  PRECAUTIONS: None  RED FLAGS: None     WEIGHT BEARING RESTRICTIONS: No  FALLS:  Has patient fallen in last 6 months?  Has not had a fall, but has tripped recently  LIVING ENVIRONMENT: Lives with: lives with their spouse Lives in: House/apartment Stairs:  one level home with upstairs bonus room above the garage Has following  equipment at home: Single point cane, Environmental consultant - 2 wheeled, and  Grab bars  OCCUPATION: Retired  PLOF: Independent and Leisure: outdoors, water aerobics, exercise, spending time with family/friends  PATIENT GOALS: To be stronger to be able to lift items around home, like the mattress to change the sheets easier.  NEXT MD VISIT: Dr Denyse Amass on December 9th  OBJECTIVE:  Note: Objective measures were completed at Evaluation unless otherwise noted.  DIAGNOSTIC FINDINGS:  Cervical Radiograph on 08/13/2023: IMPRESSION: No acute osseous abnormality. Mild midcervical spine predominant degenerative changes. Assessment of the odontoid view is markedly limited due to technique.  Left shoulder radiograph on 08/13/2023: FINDINGS: There is no evidence of fracture or dislocation. Mild degenerative changes of the [facility] joint. There is no other focal bone abnormality. Soft tissues are unremarkable.  Right shoulder radiograph on 08/13/2023: FINDINGS: Degenerative spurring at the acromioclavicular joint which is narrow. Mild spurring at the [facility] joint. No acute fracture, bone lesion, or subluxation.   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  At times, has numbness and tingling in hands, as well.  POSTURE: rounded shoulders, forward head, and increased thoracic kyphosis  PALPATION: Pt with tight musculature noted in bilateral cervical region   CERVICAL ROM:   Active ROM A/PROM (deg) eval  Flexion 55  Extension 40  Right lateral flexion 20  Left lateral flexion 20  Right rotation 50  Left rotation 45   (Blank rows = not tested)  UPPER EXTREMITY ROM:  Active ROM Right eval Left eval  Shoulder flexion 135 130  Shoulder extension 70 65  Shoulder abduction 137 130   (Blank rows = not tested)  UPPER EXTREMITY MMT:  Eval:   Bilateral shoulder  strength grossly 4 to 4+/5 throughout  FUNCTIONAL TESTS:  Eval:   5 times sit to stand: 11.24 sec  TODAY'S TREATMENT:                                                                                                                              DATE: 09/10/2023 UBE level 1 x 6 min (3/3) with PT present to discuss status 3 way scapular stabilization with blue loop x 5 each side 4D ball rolls x 20 each direction on each UE Lengthy discussion regarding status of shoulders.  Educated patient on the fact that we have been working her shoulders for about 7 visits and she states she is doing her exercises 2 times per day.  She feels like there has been little change.  She is likely dealing with some arthritic changes.  Would suggest discussing option of cortisone to shoulders.  Her neck pain is understandable with her posture.  Suggested we try traction today.   Cervical traction x 15 min at 10 lbs with 60 sec hold and 10 sec rest at 100% speed  DATE: 09/06/2023 UBE level 1 x 6 min (3/3) with PT present to  discuss status 3 way scapular stabilization with blue loop x 5 each side 4D ball rolls x 20 each direction on each UE Thread the needle with thoracic rotation x 5 each side (from elbow) Prone shoulder extension, rows and horizontal abduction with 2 lbs 2 x 10 both Side lying shoulder ER x 20 with 2 lbs both Supine serratus punch with 2 lbs x 20  both   DATE: 09/04/2023 Nustep level 4 x5 min with PT present to discuss status Shoulder pulleys for flexion and abduction x1 min each (adjusted pulleys for home and utilized them today) Supine MELT method with head on soft foam roll:  x20 horizontal rotations x20 head nods Supine cervical retraction 2x10 Supine scapular retraction/shoulder press 2x10 Supine serratus punch with 2# 2x10 bilat Supine hand tracing of alphabet with 2# in hand A-Z bilat Supine shoulder flexion with 2# dumbbells 2x10 bilat   PATIENT EDUCATION:  Education details:  Issued HEP Person educated: Patient Education method: Explanation, Facilities manager, and Handouts Education comprehension: verbalized understanding  HOME EXERCISE PROGRAM: Access Code: [email or web address] URL: [email or web address] Date: 08/23/2023 Prepared by: Mikey Kirschner  Exercises - Seated Scapular Retraction  - 1 x daily - 7 x weekly - 2 sets - 10 reps - Seated Cervical Rotation AROM  - 1 x daily - 7 x weekly - 2 sets - 10 reps - Seated Cervical Sidebending AROM  - 1 x daily - 7 x weekly - 2 sets - 10 reps - Seated Cervical Retraction  - 1 x daily - 7 x weekly - 2 sets - 10 reps - Prone Shoulder Extension - Single Arm  - 2 x daily - 7 x weekly - 2 sets - 10 reps - Prone Shoulder Row  - 2 x daily - 7 x weekly - 2 sets - 10 reps - Prone Single Arm Shoulder Horizontal Abduction with Scapular Retraction and Palm Down  - 2 x daily - 7 x weekly - 2 sets - 10 reps - Sidelying Shoulder External Rotation  - 2 x daily - 7 x weekly - 2 sets - 10 reps - Single Arm Serratus Punches in Supine with Dumbbell  - 2 x daily - 7 x weekly - 2 sets - 10 reps  ASSESSMENT:  CLINICAL IMPRESSION: Ms Falconer is doing ok functionally but continues to have about the same level pain report in her shoulders and neck.  She states she is doing the shoulder exercises 2 times per day.  We did cervical traction today and she seemed to respond well to this.  We will incorporate this into her treatment plan if this continues to be effective. She continues to progress toward her goals.  Patient continues to require skilled PT to progress towards goal related activities.  OBJECTIVE IMPAIRMENTS: decreased ROM, decreased strength, increased muscle spasms, impaired flexibility, impaired UE functional use, improper body mechanics, postural dysfunction, and pain.   ACTIVITY LIMITATIONS: carrying, lifting, sleeping, and reach over head  PARTICIPATION LIMITATIONS: cleaning, laundry, and community activity  PERSONAL  FACTORS: Past/current experiences, Time since onset of injury/illness/exacerbation, and 3+ comorbidities: GBS, Raynaud's, OA, asthma   are also affecting patient's functional outcome.   REHAB POTENTIAL: Good  CLINICAL DECISION MAKING: Evolving/moderate complexity  EVALUATION COMPLEXITY: Moderate   GOALS: Goals reviewed with patient? Yes  SHORT TERM GOALS: Target date: 09/07/2023  Patient will be independent with initial HEP. Baseline:  Goal status: MET 09/10/23  2.  Patient will improve cervical and shoulder A/ROM to [facility] to allow her to perform  various household tasks. Baseline:  Goal status:  MET 09/10/23   LONG TERM GOALS: Target date: 10/12/2023  Patient will be independent with advanced HEP to allow her self-progression after discharge. Baseline:  Goal status: INITIAL  2.  Patient will increase her functional UE strength to [facility] to allow her to lift items and place in overhead shelves and to lift suitcases. Baseline:  Goal status: INITIAL  3.  Patient will be able to participate in previous exercise classes without increased pain. Baseline:  Goal status: INITIAL  4.  Patient will increase FOTO to 57 to demonstrate improvements in functional tasks. Baseline: 41 Goal status: INITIAL    PLAN:  PT FREQUENCY: 2x/week  PT DURATION: 8 weeks  PLANNED INTERVENTIONS: 97164- PT Re-evaluation, 97110-Therapeutic exercises, 97530- Therapeutic activity, 97112- Neuromuscular re-education, 97535- Self Care, 16109- Manual therapy, [id number]- Canalith repositioning, [id number]- Aquatic Therapy, 97014- Electrical stimulation (unattended), [id number]- Electrical stimulation (manual), [id number]- Vasopneumatic device, [id number]- Ultrasound, [id number]- Traction (mechanical), [id number]- Ionotophoresis 4mg /ml Dexamethasone, Patient/Family education, Balance training, Taping, Dry Needling, Joint mobilization, Joint manipulation, Spinal manipulation, Spinal mobilization, Vestibular training, Cryotherapy, and Moist  heat  PLAN FOR NEXT SESSION: Traction if effective. Thoracic rotation exercises, shoulder depression exercises, assess and progress HEP as indicated, strengthening, ROM/flexibility, postural stabilization   Starkisha Tullis B. Quest Tavenner, PT 09/10/23 8:44 AM [facility] [address] [place], [place] [address] Phone # [phone] Fax [phone]

## 2023-09-18 ENCOUNTER — Encounter: Payer: Self-pay | Admitting: Rehabilitative and Restorative Service Providers"

## 2023-09-18 ENCOUNTER — Ambulatory Visit: Payer: Medicare HMO | Attending: Family Medicine | Admitting: Rehabilitative and Restorative Service Providers"

## 2023-09-18 DIAGNOSIS — M25511 Pain in right shoulder: Secondary | ICD-10-CM | POA: Diagnosis not present

## 2023-09-18 DIAGNOSIS — R252 Cramp and spasm: Secondary | ICD-10-CM | POA: Insufficient documentation

## 2023-09-18 DIAGNOSIS — M25512 Pain in left shoulder: Secondary | ICD-10-CM | POA: Diagnosis not present

## 2023-09-18 DIAGNOSIS — M542 Cervicalgia: Secondary | ICD-10-CM | POA: Insufficient documentation

## 2023-09-18 DIAGNOSIS — M6281 Muscle weakness (generalized): Secondary | ICD-10-CM | POA: Diagnosis not present

## 2023-09-18 DIAGNOSIS — J3089 Other allergic rhinitis: Secondary | ICD-10-CM | POA: Diagnosis not present

## 2023-09-18 DIAGNOSIS — R293 Abnormal posture: Secondary | ICD-10-CM | POA: Insufficient documentation

## 2023-09-18 DIAGNOSIS — J301 Allergic rhinitis due to pollen: Secondary | ICD-10-CM | POA: Diagnosis not present

## 2023-09-18 DIAGNOSIS — J3081 Allergic rhinitis due to animal (cat) (dog) hair and dander: Secondary | ICD-10-CM | POA: Diagnosis not present

## 2023-09-18 DIAGNOSIS — R262 Difficulty in walking, not elsewhere classified: Secondary | ICD-10-CM | POA: Insufficient documentation

## 2023-09-18 NOTE — Therapy (Signed)
OUTPATIENT PHYSICAL THERAPY TREATMENT NOTE   Patient Name: Brianna Gates MRN: [id number] DOB:1950-11-17, 72 y.o., female Today's Date: 09/18/2023  END OF SESSION:  PT End of Session - 09/18/23 1237     Visit Number 8    Date for PT Re-Evaluation 10/12/23    Authorization Type [employer]    Progress Note Due on Visit 10    PT Start Time 1235    PT Stop Time 1315    PT Time Calculation (min) 40 min    Activity Tolerance Patient tolerated treatment well    Behavior During Therapy [facility] for tasks assessed/performed             Past Medical History:  Diagnosis Date   Allergy    Anxiety    Asthma    INFREQUENT PROBLEM - rarely used inhaler   CAD (coronary artery disease) 06/29/2023   CCTA 06/27/23: CAC score 15.7 (45th percentile), mild nonobstructive CAD [mid LAD 1-24, proximal LCx 1-24]    Cervical spondylosis    Cervical strain 07/22/2014   Chest pain of uncertain etiology 12/13/2021   Complication of anesthesia    UNKNOWN REACTION WITH RASH AT SITE OF IV only with knee surgery   Degenerative arthritis    Dyslipidemia    Eczema    GERD (gastroesophageal reflux disease)    Guillain-Barre syndrome (HCC)    History of Guillain-Barre syndrome 07/22/2014   Hyperlipidemia    Hypertension    Hypothyroidism    Lumbosacral spondylosis    Palpitations 12/12/2021   Pure hypercholesterolemia 12/13/2021   Raynaud disease    Thyroid nodule    Past Surgical History:  Procedure Laterality Date   ABDOMINAL HYSTERECTOMY     COLONOSCOPY     ESOPHAGOGASTRODUODENOSCOPY (EGD) WITH PROPOFOL N/A 09/13/2021   Procedure: ESOPHAGOGASTRODUODENOSCOPY (EGD) WITH PROPOFOL;  Surgeon: Hilarie Fredrickson, MD;  Location: [facility];  Service: Endoscopy;  Laterality: N/A;   FOOT SURGERY Left    GANGLION CYST EXCISION Left    Wrist   HERNIA REPAIR     KNEE ARTHROSCOPY     RT KNEE   KNEE ARTHROTOMY Right 08/27/2013   Procedure: RIGHT KNEE SCAR EXCISION AND FEMORAL REVISION;  Surgeon:  Loanne Drilling, MD;  Location: [facility];  Service: Orthopedics;  Laterality: Right;   REPLACEMENT TOTAL KNEE Right    2014, 2021   REPLACEMENT TOTAL KNEE BILATERAL Bilateral    STRABISMUS SURGERY Right    TONSILLECTOMY     vaginal vulvo prolapse  09/16/2015   Patient Active Problem List   Diagnosis Date Noted   CAD (coronary artery disease) 06/29/2023   Paresthesia 10/18/2022   Onychomycosis 01/29/2022   Chest pain of uncertain etiology 12/13/2021   Pure hypercholesterolemia 12/13/2021   Palpitations 12/12/2021   Esophageal dysphagia    Allergic rhinitis 04/04/2021   Allergic rhinitis due to animal (cat) (dog) hair and dander 04/04/2021   Allergic rhinitis due to pollen 04/04/2021   Hormone deficiency 04/04/2021   Syncope and collapse 12/27/2020   Hypotension 12/27/2020   Bradycardia 12/27/2020   History of COVID-19 12/27/2020   Preoperative evaluation to rule out surgical contraindication 07/16/2020   Arthrofibrosis of knee joint, right 11/26/2019   Arthritis of knee 07/30/2019   Presence of right artificial knee joint 07/30/2019   Sleep apnea 10/16/2018   Impingement syndrome of left shoulder region 01/15/2018   Gastroesophageal reflux disease 02/02/2016   Sensorineural hearing loss (SNHL), bilateral 02/02/2016   Vaginal vault prolapse after hysterectomy 07/28/2015  History of Guillain-Barre syndrome 07/22/2014   Cervical strain 07/22/2014   Postoperative stiffness of total knee replacement (HCC) 08/27/2013   Abnormality of gait 08/22/2012   Cervical spondylosis without myelopathy 08/22/2012   Muscle weakness (generalized) 08/22/2012   Guillain-Barre syndrome (HCC) 08/22/2012   Essential hypertension 08/22/2012   Strabismus 08/22/2012   Degenerative joint disease of knee, left 08/22/2012   Hypothyroidism 08/22/2012   Raynaud's disease 08/22/2012   Knee joint replacement by other means 08/22/2012    PCP: Chilton Greathouse, MD  REFERRING PROVIDER: Rodolph Bong,  MD  REFERRING DIAG: M25.511,G89.29,M25.512 (ICD-10-CM) - Chronic pain of both shoulders M54.2,G89.29 (ICD-10-CM) - Neck pain, chronic  THERAPY DIAG:  Cervicalgia  Cramp and spasm  Muscle weakness (generalized)  Bilateral shoulder pain, unspecified chronicity  Rationale for Evaluation and Treatment: Rehabilitation  ONSET DATE: Has had pain for years, but it has gotten worse in past 2-3 months   SUBJECTIVE:                                                                                                                                                                                                         SUBJECTIVE STATEMENT: Patient states that the traction seemed to help some when she was here, but the effects did not last long.  States that she would like to try it again.  Hand dominance: Right  PERTINENT HISTORY:  Bilateral TKA (with 2 revisions on right knee), HTN, Raynaud's disease, CAD, hard of hearing (wears hearing aide), asthma, OA  PAIN:   Are you having pain? Yes: NPRS scale: overall 5/10 Pain location: cervical and bilat shoulder Pain description: "just there" Aggravating factors: sleeping, overhead reaching Relieving factors: sometimes medication  PRECAUTIONS: None  RED FLAGS: None     WEIGHT BEARING RESTRICTIONS: No  FALLS:  Has patient fallen in last 6 months?  Has not had a fall, but has tripped recently  LIVING ENVIRONMENT: Lives with: lives with their spouse Lives in: House/apartment Stairs:  one level home with upstairs bonus room above the garage Has following equipment at home: Single point cane, Environmental consultant - 2 wheeled, and Grab bars  OCCUPATION: Retired  [employer]: Independent and Leisure: outdoors, water aerobics, exercise, spending time with family/friends  PATIENT GOALS: To be stronger to be able to lift items around home, like the mattress to change the sheets easier.  NEXT MD VISIT: Dr Denyse Amass on December 9th  OBJECTIVE:  Note: Objective  measures were completed at Evaluation unless otherwise noted.  DIAGNOSTIC FINDINGS:  Cervical Radiograph on 08/13/2023: IMPRESSION: No acute osseous abnormality. Mild  midcervical spine predominant degenerative changes. Assessment of the odontoid view is markedly limited due to technique.  Left shoulder radiograph on 08/13/2023: FINDINGS: There is no evidence of fracture or dislocation. Mild degenerative changes of the [facility] joint. There is no other focal bone abnormality. Soft tissues are unremarkable.  Right shoulder radiograph on 08/13/2023: FINDINGS: Degenerative spurring at the acromioclavicular joint which is narrow. Mild spurring at the [facility] joint. No acute fracture, bone lesion, or subluxation.   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  At times, has numbness and tingling in hands, as well.  POSTURE: rounded shoulders, forward head, and increased thoracic kyphosis  PALPATION: Pt with tight musculature noted in bilateral cervical region   CERVICAL ROM:   Active ROM A/PROM (deg) eval  Flexion 55  Extension 40  Right lateral flexion 20  Left lateral flexion 20  Right rotation 50  Left rotation 45   (Blank rows = not tested)  UPPER EXTREMITY ROM:  Active ROM Right eval Left eval  Shoulder flexion 135 130  Shoulder extension 70 65  Shoulder abduction 137 130   (Blank rows = not tested)  UPPER EXTREMITY MMT:  Eval:   Bilateral shoulder strength grossly 4 to 4+/5 throughout  FUNCTIONAL TESTS:  Eval:   5 times sit to stand: 11.24 sec  TODAY'S TREATMENT:                                                                                                                               DATE: 09/18/2023 Nustep level 5 x6 min with PT present to discuss status 3 way scapular stabilization with blue loop x 5 each side 4D  ball rolls x 20 each direction on each UE Supine MELT method with head on soft foam roll:  x20 horizontal rotations x20 head nods Cervical Traction:  x10 minutes at 14 lbs with 60 sec hold and 10 sec rest at 50% speed   DATE: 09/10/2023 UBE level 1 x 6 min (3/3) with PT present to discuss status 3 way scapular stabilization with blue loop x 5 each side 4D ball rolls x 20 each direction on each UE Lengthy discussion regarding status of shoulders.  Educated patient on the fact that we have been working her shoulders for about 7 visits and she states she is doing her exercises 2 times per day.  She feels like there has been little change.  She is likely dealing with some arthritic changes.  Would suggest discussing option of cortisone to shoulders.  Her neck pain is understandable with her posture.  Suggested we try traction today.   Cervical traction x 15 min at 10 lbs with 60 sec hold and 10 sec rest at 100% speed  DATE: 09/06/2023 UBE level 1 x 6 min (3/3) with PT present to discuss status 3 way  scapular stabilization with blue loop x 5 each side 4D ball rolls x 20 each direction on each UE Thread the needle with thoracic rotation x 5 each side (from elbow) Prone shoulder extension, rows and horizontal abduction with 2 lbs 2 x 10 both Side lying shoulder ER x 20 with 2 lbs both Supine serratus punch with 2 lbs x 20  both   PATIENT EDUCATION:  Education details: Issued HEP Person educated: Patient Education method: Explanation, Demonstration, and Handouts Education comprehension: verbalized understanding  HOME EXERCISE PROGRAM: Access Code: [email or web address] URL: [email or web address] Date: 08/23/2023 Prepared by: Mikey Kirschner  Exercises - Seated Scapular Retraction  - 1 x daily - 7 x weekly - 2 sets - 10 reps - Seated Cervical Rotation AROM  - 1 x daily - 7 x weekly - 2 sets - 10 reps - Seated Cervical Sidebending AROM  - 1 x daily - 7 x weekly - 2 sets - 10 reps -  Seated Cervical Retraction  - 1 x daily - 7 x weekly - 2 sets - 10 reps - Prone Shoulder Extension - Single Arm  - 2 x daily - 7 x weekly - 2 sets - 10 reps - Prone Shoulder Row  - 2 x daily - 7 x weekly - 2 sets - 10 reps - Prone Single Arm Shoulder Horizontal Abduction with Scapular Retraction and Palm Down  - 2 x daily - 7 x weekly - 2 sets - 10 reps - Sidelying Shoulder External Rotation  - 2 x daily - 7 x weekly - 2 sets - 10 reps - Single Arm Serratus Punches in Supine with Dumbbell  - 2 x daily - 7 x weekly - 2 sets - 10 reps  ASSESSMENT:  CLINICAL IMPRESSION: Ms Delashmutt presents to skilled PT with continued reports of 5/10 pain.  Patient states that she is doing her exercise classes, but needs to make some modifications secondary to increased pain.  Patient did report some initial relief of pain with traction, so proceeded with a second session of cervical traction with increased pounds to assess if this assisted patient with decreased overall pain.  Patient did report some decrease in cervical pain following traction.  Patient continues to require skilled PT to progress towards goal related activities.  OBJECTIVE IMPAIRMENTS: decreased ROM, decreased strength, increased muscle spasms, impaired flexibility, impaired UE functional use, improper body mechanics, postural dysfunction, and pain.   ACTIVITY LIMITATIONS: carrying, lifting, sleeping, and reach over head  PARTICIPATION LIMITATIONS: cleaning, laundry, and community activity  PERSONAL FACTORS: Past/current experiences, Time since onset of injury/illness/exacerbation, and 3+ comorbidities: GBS, Raynaud's, OA, asthma   are also affecting patient's functional outcome.   REHAB POTENTIAL: Good  CLINICAL DECISION MAKING: Evolving/moderate complexity  EVALUATION COMPLEXITY: Moderate   GOALS: Goals reviewed with patient? Yes  SHORT TERM GOALS: Target date: 09/07/2023  Patient will be independent with initial HEP. Baseline:   Goal status: MET 09/10/23  2.  Patient will improve cervical and shoulder A/ROM to [facility] to allow her to perform various household tasks. Baseline:  Goal status:  MET 09/10/23   LONG TERM GOALS: Target date: 10/12/2023  Patient will be independent with advanced HEP to allow her self-progression after discharge. Baseline:  Goal status: Ongoing  2.  Patient will increase her functional UE strength to [facility] to allow her to lift items and place in overhead shelves and to lift suitcases. Baseline:  Goal status: Ongoing  3.  Patient will be able to participate in previous exercise  classes without increased pain. Baseline:  Goal status: INITIAL  4.  Patient will increase FOTO to 57 to demonstrate improvements in functional tasks. Baseline: 41 Goal status: INITIAL    PLAN:  PT FREQUENCY: 2x/week  PT DURATION: 8 weeks  PLANNED INTERVENTIONS: 97164- PT Re-evaluation, 97110-Therapeutic exercises, 97530- Therapeutic activity, [id number]- Neuromuscular re-education, 97535- Self Care, 16109- Manual therapy, [id number]- Canalith repositioning, [id number]- Aquatic Therapy, 97014- Electrical stimulation (unattended), [id number]- Electrical stimulation (manual), [id number]- Vasopneumatic device, [id number]- Ultrasound, [id number]- Traction (mechanical), [id number]- Ionotophoresis 4mg /ml Dexamethasone, Patient/Family education, Balance training, Taping, Dry Needling, Joint mobilization, Joint manipulation, Spinal manipulation, Spinal mobilization, Vestibular training, Cryotherapy, and Moist heat  PLAN FOR NEXT SESSION: Assess response to traction, try home unit if patient continues to have some relief. Thoracic rotation exercises, shoulder depression exercises, assess and progress HEP as indicated, strengthening, ROM/flexibility, postural stabilization   Reather Laurence, PT, DPT 09/18/23, 1:22 PM  [facility] [address] [place], [place] [address] Phone # [phone] Fax [phone]

## 2023-09-19 DIAGNOSIS — F330 Major depressive disorder, recurrent, mild: Secondary | ICD-10-CM | POA: Diagnosis not present

## 2023-09-20 ENCOUNTER — Ambulatory Visit: Payer: Medicare HMO

## 2023-09-20 DIAGNOSIS — R252 Cramp and spasm: Secondary | ICD-10-CM | POA: Diagnosis not present

## 2023-09-20 DIAGNOSIS — M25511 Pain in right shoulder: Secondary | ICD-10-CM | POA: Diagnosis not present

## 2023-09-20 DIAGNOSIS — M542 Cervicalgia: Secondary | ICD-10-CM

## 2023-09-20 DIAGNOSIS — R262 Difficulty in walking, not elsewhere classified: Secondary | ICD-10-CM | POA: Diagnosis not present

## 2023-09-20 DIAGNOSIS — R293 Abnormal posture: Secondary | ICD-10-CM | POA: Diagnosis not present

## 2023-09-20 DIAGNOSIS — M6281 Muscle weakness (generalized): Secondary | ICD-10-CM

## 2023-09-20 DIAGNOSIS — M25512 Pain in left shoulder: Secondary | ICD-10-CM | POA: Diagnosis not present

## 2023-09-20 NOTE — Therapy (Signed)
OUTPATIENT PHYSICAL THERAPY TREATMENT NOTE   Patient Name: Brianna Gates MRN: [id number] DOB:1951-01-12, 72 y.o., female Today's Date: 09/20/2023  END OF SESSION:  PT End of Session - 09/20/23 1530     Visit Number 9    Date for PT Re-Evaluation 10/12/23    Authorization Type [employer]    Progress Note Due on Visit 10    PT Start Time 1532    PT Stop Time 1620    PT Time Calculation (min) 48 min    Activity Tolerance Patient tolerated treatment well    Behavior During Therapy [facility] for tasks assessed/performed             Past Medical History:  Diagnosis Date   Allergy    Anxiety    Asthma    INFREQUENT PROBLEM - rarely used inhaler   CAD (coronary artery disease) 06/29/2023   CCTA 06/27/23: CAC score 15.7 (45th percentile), mild nonobstructive CAD [mid LAD 1-24, proximal LCx 1-24]    Cervical spondylosis    Cervical strain 07/22/2014   Chest pain of uncertain etiology 12/13/2021   Complication of anesthesia    UNKNOWN REACTION WITH RASH AT SITE OF IV only with knee surgery   Degenerative arthritis    Dyslipidemia    Eczema    GERD (gastroesophageal reflux disease)    Guillain-Barre syndrome (HCC)    History of Guillain-Barre syndrome 07/22/2014   Hyperlipidemia    Hypertension    Hypothyroidism    Lumbosacral spondylosis    Palpitations 12/12/2021   Pure hypercholesterolemia 12/13/2021   Raynaud disease    Thyroid nodule    Past Surgical History:  Procedure Laterality Date   ABDOMINAL HYSTERECTOMY     COLONOSCOPY     ESOPHAGOGASTRODUODENOSCOPY (EGD) WITH PROPOFOL N/A 09/13/2021   Procedure: ESOPHAGOGASTRODUODENOSCOPY (EGD) WITH PROPOFOL;  Surgeon: Hilarie Fredrickson, MD;  Location: [facility];  Service: Endoscopy;  Laterality: N/A;   FOOT SURGERY Left    GANGLION CYST EXCISION Left    Wrist   HERNIA REPAIR     KNEE ARTHROSCOPY     RT KNEE   KNEE ARTHROTOMY Right 08/27/2013   Procedure: RIGHT KNEE SCAR EXCISION AND FEMORAL REVISION;  Surgeon:  Loanne Drilling, MD;  Location: [facility];  Service: Orthopedics;  Laterality: Right;   REPLACEMENT TOTAL KNEE Right    2014, 2021   REPLACEMENT TOTAL KNEE BILATERAL Bilateral    STRABISMUS SURGERY Right    TONSILLECTOMY     vaginal vulvo prolapse  09/16/2015   Patient Active Problem List   Diagnosis Date Noted   CAD (coronary artery disease) 06/29/2023   Paresthesia 10/18/2022   Onychomycosis 01/29/2022   Chest pain of uncertain etiology 12/13/2021   Pure hypercholesterolemia 12/13/2021   Palpitations 12/12/2021   Esophageal dysphagia    Allergic rhinitis 04/04/2021   Allergic rhinitis due to animal (cat) (dog) hair and dander 04/04/2021   Allergic rhinitis due to pollen 04/04/2021   Hormone deficiency 04/04/2021   Syncope and collapse 12/27/2020   Hypotension 12/27/2020   Bradycardia 12/27/2020   History of COVID-19 12/27/2020   Preoperative evaluation to rule out surgical contraindication 07/16/2020   Arthrofibrosis of knee joint, right 11/26/2019   Arthritis of knee 07/30/2019   Presence of right artificial knee joint 07/30/2019   Sleep apnea 10/16/2018   Impingement syndrome of left shoulder region 01/15/2018   Gastroesophageal reflux disease 02/02/2016   Sensorineural hearing loss (SNHL), bilateral 02/02/2016   Vaginal vault prolapse after hysterectomy 07/28/2015  History of Guillain-Barre syndrome 07/22/2014   Cervical strain 07/22/2014   Postoperative stiffness of total knee replacement (HCC) 08/27/2013   Abnormality of gait 08/22/2012   Cervical spondylosis without myelopathy 08/22/2012   Muscle weakness (generalized) 08/22/2012   Guillain-Barre syndrome (HCC) 08/22/2012   Essential hypertension 08/22/2012   Strabismus 08/22/2012   Degenerative joint disease of knee, left 08/22/2012   Hypothyroidism 08/22/2012   Raynaud's disease 08/22/2012   Knee joint replacement by other means 08/22/2012    PCP: Chilton Greathouse, MD  REFERRING PROVIDER: Rodolph Bong,  MD  REFERRING DIAG: M25.511,G89.29,M25.512 (ICD-10-CM) - Chronic pain of both shoulders M54.2,G89.29 (ICD-10-CM) - Neck pain, chronic  THERAPY DIAG:  Cervicalgia  Cramp and spasm  Muscle weakness (generalized)  Bilateral shoulder pain, unspecified chronicity  Rationale for Evaluation and Treatment: Rehabilitation  ONSET DATE: Has had pain for years, but it has gotten worse in past 2-3 months   SUBJECTIVE:                                                                                                                                                                                                         SUBJECTIVE STATEMENT: Patient states that the traction seems to be helping.  Would like to try it again.    Hand dominance: Right  PERTINENT HISTORY:  Bilateral TKA (with 2 revisions on right knee), HTN, Raynaud's disease, CAD, hard of hearing (wears hearing aide), asthma, OA  PAIN:   Are you having pain? Yes: NPRS scale: overall 5/10 Pain location: cervical and bilat shoulder Pain description: "just there" Aggravating factors: sleeping, overhead reaching Relieving factors: sometimes medication  PRECAUTIONS: None  RED FLAGS: None     WEIGHT BEARING RESTRICTIONS: No  FALLS:  Has patient fallen in last 6 months?  Has not had a fall, but has tripped recently  LIVING ENVIRONMENT: Lives with: lives with their spouse Lives in: House/apartment Stairs:  one level home with upstairs bonus room above the garage Has following equipment at home: Single point cane, Environmental consultant - 2 wheeled, and Grab bars  OCCUPATION: Retired  [employer]: Independent and Leisure: outdoors, water aerobics, exercise, spending time with family/friends  PATIENT GOALS: To be stronger to be able to lift items around home, like the mattress to change the sheets easier.  NEXT MD VISIT: Dr Denyse Amass on December 9th  OBJECTIVE:  Note: Objective measures were completed at Evaluation unless otherwise  noted.  DIAGNOSTIC FINDINGS:  Cervical Radiograph on 08/13/2023: IMPRESSION: No acute osseous abnormality. Mild midcervical spine predominant degenerative changes. Assessment of the odontoid view is markedly  limited due to technique.  Left shoulder radiograph on 08/13/2023: FINDINGS: There is no evidence of fracture or dislocation. Mild degenerative changes of the [facility] joint. There is no other focal bone abnormality. Soft tissues are unremarkable.  Right shoulder radiograph on 08/13/2023: FINDINGS: Degenerative spurring at the acromioclavicular joint which is narrow. Mild spurring at the [facility] joint. No acute fracture, bone lesion, or subluxation.   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  At times, has numbness and tingling in hands, as well.  POSTURE: rounded shoulders, forward head, and increased thoracic kyphosis  PALPATION: Pt with tight musculature noted in bilateral cervical region   CERVICAL ROM:   Active ROM A/PROM (deg) eval  Flexion 55  Extension 40  Right lateral flexion 20  Left lateral flexion 20  Right rotation 50  Left rotation 45   (Blank rows = not tested)  UPPER EXTREMITY ROM:  Active ROM Right eval Left eval  Shoulder flexion 135 130  Shoulder extension 70 65  Shoulder abduction 137 130   (Blank rows = not tested)  UPPER EXTREMITY MMT:  Eval:   Bilateral shoulder strength grossly 4 to 4+/5 throughout  FUNCTIONAL TESTS:  Eval:   5 times sit to stand: 11.24 sec  TODAY'S TREATMENT:                                                                                                                              DATE: 09/20/2023 Nustep level 5 x6 min with PT present to discuss status 3 way scapular stabilization with blue loop x 5 each side 4D ball rolls x 20 each direction on each UE Supine MELT  method with head on soft foam roll:  x20 horizontal rotations x20 head nods, with head turned left cervical rotations x 20 Cervical Traction:  x10 minutes at 16 lbs with 60 sec hold and 10 sec rest at 100% speed  DATE: 09/18/2023 Nustep level 5 x6 min with PT present to discuss status 3 way scapular stabilization with blue loop x 5 each side 4D ball rolls x 20 each direction on each UE Supine MELT method with head on soft foam roll:  x20 horizontal rotations x20 head nods Cervical Traction:  x10 minutes at 14 lbs with 60 sec hold and 10 sec rest at 50% speed  DATE: 09/10/2023 UBE level 1 x 6 min (3/3) with PT present to discuss status 3 way scapular stabilization with blue loop x 5 each side 4D ball rolls x 20 each direction on each UE Lengthy discussion regarding status of shoulders.  Educated patient on the fact that we have been working her shoulders for about 7 visits and she states she is doing her exercises 2 times per day.  She feels like there has been little change.  She is likely dealing with some arthritic  changes.  Would suggest discussing option of cortisone to shoulders.  Her neck pain is understandable with her posture.  Suggested we try traction today.   Cervical traction x 15 min at 10 lbs with 60 sec hold and 10 sec rest at 100% speed   PATIENT EDUCATION:  Education details: Issued HEP Person educated: Patient Education method: Explanation, Facilities manager, and Handouts Education comprehension: verbalized understanding  HOME EXERCISE PROGRAM: Access Code: [email or web address] URL: [email or web address] Date: 08/23/2023 Prepared by: Mikey Kirschner  Exercises - Seated Scapular Retraction  - 1 x daily - 7 x weekly - 2 sets - 10 reps - Seated Cervical Rotation AROM  - 1 x daily - 7 x weekly - 2 sets - 10 reps - Seated Cervical Sidebending AROM  - 1 x daily - 7 x weekly - 2 sets - 10 reps - Seated Cervical Retraction  - 1 x daily - 7 x weekly - 2 sets - 10 reps - Prone  Shoulder Extension - Single Arm  - 2 x daily - 7 x weekly - 2 sets - 10 reps - Prone Shoulder Row  - 2 x daily - 7 x weekly - 2 sets - 10 reps - Prone Single Arm Shoulder Horizontal Abduction with Scapular Retraction and Palm Down  - 2 x daily - 7 x weekly - 2 sets - 10 reps - Sidelying Shoulder External Rotation  - 2 x daily - 7 x weekly - 2 sets - 10 reps - Single Arm Serratus Punches in Supine with Dumbbell  - 2 x daily - 7 x weekly - 2 sets - 10 reps  ASSESSMENT:  CLINICAL IMPRESSION: Ms Desautels seems to be having some positive response to traction.   We reviewed MELT techniques but she does not seem to feel any relief or feeling of release of tension with this despite heavy verbal cues for technique.  We increased her distraction pull to 16lbs on traction today.   Patient would benefit from continued skilled PT to progress towards goal related activities.  OBJECTIVE IMPAIRMENTS: decreased ROM, decreased strength, increased muscle spasms, impaired flexibility, impaired UE functional use, improper body mechanics, postural dysfunction, and pain.   ACTIVITY LIMITATIONS: carrying, lifting, sleeping, and reach over head  PARTICIPATION LIMITATIONS: cleaning, laundry, and community activity  PERSONAL FACTORS: Past/current experiences, Time since onset of injury/illness/exacerbation, and 3+ comorbidities: GBS, Raynaud's, OA, asthma   are also affecting patient's functional outcome.   REHAB POTENTIAL: Good  CLINICAL DECISION MAKING: Evolving/moderate complexity  EVALUATION COMPLEXITY: Moderate   GOALS: Goals reviewed with patient? Yes  SHORT TERM GOALS: Target date: 09/07/2023  Patient will be independent with initial HEP. Baseline:  Goal status: MET 09/10/23  2.  Patient will improve cervical and shoulder A/ROM to [facility] to allow her to perform various household tasks. Baseline:  Goal status:  MET 09/10/23   LONG TERM GOALS: Target date: 10/12/2023  Patient will be independent  with advanced HEP to allow her self-progression after discharge. Baseline:  Goal status: Ongoing  2.  Patient will increase her functional UE strength to [facility] to allow her to lift items and place in overhead shelves and to lift suitcases. Baseline:  Goal status: Ongoing (09/19/24  patient reports she is finding it easier to reach into overhead cabinets)  3.  Patient will be able to participate in previous exercise classes without increased pain. Baseline:  Goal status: INITIAL  4.  Patient will increase FOTO to 57 to demonstrate improvements in functional tasks. Baseline: 41 Goal status:  INITIAL    PLAN:  PT FREQUENCY: 2x/week  PT DURATION: 8 weeks  PLANNED INTERVENTIONS: 97164- PT Re-evaluation, 97110-Therapeutic exercises, 97530- Therapeutic activity, [id number]- Neuromuscular re-education, 97535- Self Care, 09811- Manual therapy, [id number]- Canalith repositioning, [id number]- Aquatic Therapy, 97014- Electrical stimulation (unattended), [id number]- Electrical stimulation (manual), [id number]- Vasopneumatic device, [id number]- Ultrasound, [id number]- Traction (mechanical), [id number]- Ionotophoresis 4mg /ml Dexamethasone, Patient/Family education, Balance training, Taping, Dry Needling, Joint mobilization, Joint manipulation, Spinal manipulation, Spinal mobilization, Vestibular training, Cryotherapy, and Moist heat  PLAN FOR NEXT SESSION: Assess response to increased distraction on cervical traction, try home unit if patient continues to have some relief. Thoracic rotation exercises, shoulder depression exercises, assess and progress HEP as indicated, strengthening, ROM/flexibility, postural stabilization   Naveena Eyman B. Paislyn Domenico, PT 09/20/23 4:12 PM [facility] [address] [place], [place] [address] Phone # [phone] Fax [phone]

## 2023-09-21 NOTE — Progress Notes (Unsigned)
   Rubin Payor, PhD, LAT, ATC acting as a scribe for Clementeen Graham, MD.  Brianna Gates is a 72 y.o. female who presents to [facility] today for f/u bilat shoulder and neck pain. Pt was last seen by Dr. Denyse Amass on 08/13/23 and was referred to PT, completing 9 visits.   Today, pt reports ***  Dx imaging: 08/13/23 R & L shoulder and C-spine XR  Pertinent review of systems: ***  Relevant historical information: ***   Exam:  There were no vitals taken for this visit. General: Well Developed, well nourished, and in no acute distress.   MSK: ***    Lab and Radiology Results No results found for this or any previous visit (from the past 72 hour(s)). No results found.     Assessment and Plan: 72 y.o. female with ***   PDMP not reviewed this encounter. No orders of the defined types were placed in this encounter.  No orders of the defined types were placed in this encounter.    Discussed warning signs or symptoms. Please see discharge instructions. Patient expresses understanding.   ***

## 2023-09-24 ENCOUNTER — Ambulatory Visit: Payer: Medicare HMO | Admitting: Family Medicine

## 2023-09-24 ENCOUNTER — Other Ambulatory Visit: Payer: Self-pay

## 2023-09-24 ENCOUNTER — Encounter: Payer: Self-pay | Admitting: Family Medicine

## 2023-09-24 VITALS — BP 164/82 | HR 64 | Ht 64.0 in | Wt 123.0 lb

## 2023-09-24 DIAGNOSIS — G8929 Other chronic pain: Secondary | ICD-10-CM | POA: Diagnosis not present

## 2023-09-24 DIAGNOSIS — M7541 Impingement syndrome of right shoulder: Secondary | ICD-10-CM

## 2023-09-24 DIAGNOSIS — M25512 Pain in left shoulder: Secondary | ICD-10-CM | POA: Diagnosis not present

## 2023-09-24 DIAGNOSIS — M542 Cervicalgia: Secondary | ICD-10-CM | POA: Diagnosis not present

## 2023-09-24 DIAGNOSIS — M25511 Pain in right shoulder: Secondary | ICD-10-CM | POA: Diagnosis not present

## 2023-09-24 NOTE — Patient Instructions (Addendum)
Thank you for coming in today.  You received an injection today. Seek immediate medical attention if the joint becomes red, extremely painful, or is oozing fluid.  Continue physical therapy.   Check back in 1 week.

## 2023-09-25 ENCOUNTER — Ambulatory Visit: Payer: Medicare HMO

## 2023-09-25 DIAGNOSIS — M542 Cervicalgia: Secondary | ICD-10-CM

## 2023-09-25 DIAGNOSIS — R252 Cramp and spasm: Secondary | ICD-10-CM

## 2023-09-25 DIAGNOSIS — R293 Abnormal posture: Secondary | ICD-10-CM

## 2023-09-25 DIAGNOSIS — M6281 Muscle weakness (generalized): Secondary | ICD-10-CM | POA: Diagnosis not present

## 2023-09-25 DIAGNOSIS — M25512 Pain in left shoulder: Secondary | ICD-10-CM | POA: Diagnosis not present

## 2023-09-25 DIAGNOSIS — R262 Difficulty in walking, not elsewhere classified: Secondary | ICD-10-CM | POA: Diagnosis not present

## 2023-09-25 DIAGNOSIS — M25511 Pain in right shoulder: Secondary | ICD-10-CM

## 2023-09-25 NOTE — Therapy (Signed)
OUTPATIENT PHYSICAL THERAPY TREATMENT NOTE Progress Note Reporting Period 08/22/23 to 09/25/23  See note below for Objective Data and Assessment of Progress/Goals.       Patient Name: Brianna Gates MRN: [id number] DOB:06-27-1951, 72 y.o., female Today's Date: 09/25/2023  END OF SESSION:  PT End of Session - 09/25/23 2208     Visit Number 10    Date for PT Re-Evaluation 10/12/23    Authorization Type [employer]    Progress Note Due on Visit 20    PT Start Time 1148    PT Stop Time 1230    PT Time Calculation (min) 42 min    Activity Tolerance Patient tolerated treatment well    Behavior During Therapy [facility] for tasks assessed/performed              Past Medical History:  Diagnosis Date   Allergy    Anxiety    Asthma    INFREQUENT PROBLEM - rarely used inhaler   CAD (coronary artery disease) 06/29/2023   CCTA 06/27/23: CAC score 15.7 (45th percentile), mild nonobstructive CAD [mid LAD 1-24, proximal LCx 1-24]    Cervical spondylosis    Cervical strain 07/22/2014   Chest pain of uncertain etiology 12/13/2021   Complication of anesthesia    UNKNOWN REACTION WITH RASH AT SITE OF IV only with knee surgery   Degenerative arthritis    Dyslipidemia    Eczema    GERD (gastroesophageal reflux disease)    Guillain-Barre syndrome (HCC)    History of Guillain-Barre syndrome 07/22/2014   Hyperlipidemia    Hypertension    Hypothyroidism    Lumbosacral spondylosis    Palpitations 12/12/2021   Pure hypercholesterolemia 12/13/2021   Raynaud disease    Thyroid nodule    Past Surgical History:  Procedure Laterality Date   ABDOMINAL HYSTERECTOMY     COLONOSCOPY     ESOPHAGOGASTRODUODENOSCOPY (EGD) WITH PROPOFOL N/A 09/13/2021   Procedure: ESOPHAGOGASTRODUODENOSCOPY (EGD) WITH PROPOFOL;  Surgeon: Hilarie Fredrickson, MD;  Location: [facility];  Service: Endoscopy;  Laterality: N/A;   FOOT SURGERY Left    GANGLION CYST EXCISION Left    Wrist   HERNIA REPAIR      KNEE ARTHROSCOPY     RT KNEE   KNEE ARTHROTOMY Right 08/27/2013   Procedure: RIGHT KNEE SCAR EXCISION AND FEMORAL REVISION;  Surgeon: Loanne Drilling, MD;  Location: [facility];  Service: Orthopedics;  Laterality: Right;   REPLACEMENT TOTAL KNEE Right    2014, 2021   REPLACEMENT TOTAL KNEE BILATERAL Bilateral    STRABISMUS SURGERY Right    TONSILLECTOMY     vaginal vulvo prolapse  09/16/2015   Patient Active Problem List   Diagnosis Date Noted   CAD (coronary artery disease) 06/29/2023   Paresthesia 10/18/2022   Onychomycosis 01/29/2022   Chest pain of uncertain etiology 12/13/2021   Pure hypercholesterolemia 12/13/2021   Palpitations 12/12/2021   Esophageal dysphagia    Allergic rhinitis 04/04/2021   Allergic rhinitis due to animal (cat) (dog) hair and dander 04/04/2021   Allergic rhinitis due to pollen 04/04/2021   Hormone deficiency 04/04/2021   Syncope and collapse 12/27/2020   Hypotension 12/27/2020   Bradycardia 12/27/2020   History of COVID-19 12/27/2020   Preoperative evaluation to rule out surgical contraindication 07/16/2020   Arthrofibrosis of knee joint, right 11/26/2019   Arthritis of knee 07/30/2019   Presence of right artificial knee joint 07/30/2019   Sleep apnea 10/16/2018   Shoulder impingement syndrome 01/15/2018   Gastroesophageal  reflux disease 02/02/2016   Sensorineural hearing loss (SNHL), bilateral 02/02/2016   Vaginal vault prolapse after hysterectomy 07/28/2015   History of Guillain-Barre syndrome 07/22/2014   Cervical strain 07/22/2014   Postoperative stiffness of total knee replacement (HCC) 08/27/2013   Abnormality of gait 08/22/2012   Cervical spondylosis without myelopathy 08/22/2012   Muscle weakness (generalized) 08/22/2012   Guillain-Barre syndrome (HCC) 08/22/2012   Essential hypertension 08/22/2012   Strabismus 08/22/2012   Degenerative joint disease of knee, left 08/22/2012   Hypothyroidism 08/22/2012   Raynaud's disease 08/22/2012    Knee joint replacement by other means 08/22/2012    PCP: Chilton Greathouse, MD  REFERRING PROVIDER: Rodolph Bong, MD  REFERRING DIAG: M25.511,G89.29,M25.512 (ICD-10-CM) - Chronic pain of both shoulders M54.2,G89.29 (ICD-10-CM) - Neck pain, chronic  THERAPY DIAG:  Abnormal posture  Cervicalgia  Cramp and spasm  Muscle weakness (generalized)  Bilateral shoulder pain, unspecified chronicity  Rationale for Evaluation and Treatment: Rehabilitation  ONSET DATE: Has had pain for years, but it has gotten worse in past 2-3 months   SUBJECTIVE:                                                                                                                                                                                                         SUBJECTIVE STATEMENT: Patient states that she is feeling better.      Hand dominance: Right  PERTINENT HISTORY:  Bilateral TKA (with 2 revisions on right knee), HTN, Raynaud's disease, CAD, hard of hearing (wears hearing aide), asthma, OA  PAIN:   Are you having pain? Yes: NPRS scale: overall 5/10 Pain location: cervical and bilat shoulder Pain description: "just there" Aggravating factors: sleeping, overhead reaching Relieving factors: sometimes medication  PRECAUTIONS: None  RED FLAGS: None     WEIGHT BEARING RESTRICTIONS: No  FALLS:  Has patient fallen in last 6 months?  Has not had a fall, but has tripped recently  LIVING ENVIRONMENT: Lives with: lives with their spouse Lives in: House/apartment Stairs:  one level home with upstairs bonus room above the garage Has following equipment at home: Single point cane, Environmental consultant - 2 wheeled, and Grab bars  OCCUPATION: Retired  [employer]: Independent and Leisure: outdoors, water aerobics, exercise, spending time with family/friends  PATIENT GOALS: To be stronger to be able to lift items around home, like the mattress to change the sheets easier.  NEXT MD VISIT: Dr Denyse Amass on December  9th  OBJECTIVE:  Note: Objective measures were completed at Evaluation unless otherwise noted.  DIAGNOSTIC FINDINGS:  Cervical Radiograph on 08/13/2023: IMPRESSION:  No acute osseous abnormality. Mild midcervical spine predominant degenerative changes. Assessment of the odontoid view is markedly limited due to technique.  Left shoulder radiograph on 08/13/2023: FINDINGS: There is no evidence of fracture or dislocation. Mild degenerative changes of the [facility] joint. There is no other focal bone abnormality. Soft tissues are unremarkable.  Right shoulder radiograph on 08/13/2023: FINDINGS: Degenerative spurring at the acromioclavicular joint which is narrow. Mild spurring at the [facility] joint. No acute fracture, bone lesion, or subluxation.   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  At times, has numbness and tingling in hands, as well.  POSTURE: rounded shoulders, forward head, and increased thoracic kyphosis  PALPATION: Pt with tight musculature noted in bilateral cervical region   CERVICAL ROM:   Active ROM A/PROM (deg) eval  Flexion 55  Extension 40  Right lateral flexion 20  Left lateral flexion 20  Right rotation 50  Left rotation 45   (Blank rows = not tested)  UPPER EXTREMITY ROM:  Active ROM Right eval Left eval  Shoulder flexion 135 130  Shoulder extension 70 65  Shoulder abduction 137 130   (Blank rows = not tested)  UPPER EXTREMITY MMT:  Eval:   Bilateral shoulder strength grossly 4 to 4+/5 throughout  FUNCTIONAL TESTS:  Eval:   5 times sit to stand: 11.24 sec  TODAY'S TREATMENT:                                                                                                                              DATE: 09/25/2023 UBE x 6 min 3/3 with PT present to discuss status 3 way scapular stabilization with  yellow loop x 5 each side 4D ball rolls x 20 each direction on each UE Reviewed home exercises including pulleys and went over multiple exercises that she is doing in a class that may be hurting her shoulders.   Cervical Traction:  x10 minutes at 18 lbs with 60 sec hold and 10 sec rest at 100% speed  DATE: 09/20/2023 Nustep level 5 x6 min with PT present to discuss status 3 way scapular stabilization with blue loop x 5 each side 4D ball rolls x 20 each direction on each UE Supine MELT method with head on soft foam roll:  x20 horizontal rotations x20 head nods, with head turned left cervical rotations x 20 Cervical Traction:  x10 minutes at 16 lbs with 60 sec hold and 10 sec rest at 100% speed  DATE: 09/18/2023 Nustep level 5 x6 min with PT present to discuss status 3 way scapular stabilization with blue loop x 5 each side 4D ball rolls x 20 each direction on each UE Supine MELT method with head on soft foam roll:  x20 horizontal rotations x20 head nods Cervical Traction:  x10 minutes at 14 lbs with 60 sec hold  and 10 sec rest at 50% speed  DATE: 09/10/2023 UBE level 1 x 6 min (3/3) with PT present to discuss status 3 way scapular stabilization with blue loop x 5 each side 4D ball rolls x 20 each direction on each UE Lengthy discussion regarding status of shoulders.  Educated patient on the fact that we have been working her shoulders for about 7 visits and she states she is doing her exercises 2 times per day.  She feels like there has been little change.  She is likely dealing with some arthritic changes.  Would suggest discussing option of cortisone to shoulders.  Her neck pain is understandable with her posture.  Suggested we try traction today.   Cervical traction x 15 min at 10 lbs with 60 sec hold and 10 sec rest at 100% speed   PATIENT EDUCATION:  Education details: Issued HEP Person educated: Patient Education method: Explanation, Facilities manager, and Handouts Education  comprehension: verbalized understanding  HOME EXERCISE PROGRAM: Access Code: [email or web address] URL: [email or web address] Date: 08/23/2023 Prepared by: Mikey Kirschner  Exercises - Seated Scapular Retraction  - 1 x daily - 7 x weekly - 2 sets - 10 reps - Seated Cervical Rotation AROM  - 1 x daily - 7 x weekly - 2 sets - 10 reps - Seated Cervical Sidebending AROM  - 1 x daily - 7 x weekly - 2 sets - 10 reps - Seated Cervical Retraction  - 1 x daily - 7 x weekly - 2 sets - 10 reps - Prone Shoulder Extension - Single Arm  - 2 x daily - 7 x weekly - 2 sets - 10 reps - Prone Shoulder Row  - 2 x daily - 7 x weekly - 2 sets - 10 reps - Prone Single Arm Shoulder Horizontal Abduction with Scapular Retraction and Palm Down  - 2 x daily - 7 x weekly - 2 sets - 10 reps - Sidelying Shoulder External Rotation  - 2 x daily - 7 x weekly - 2 sets - 10 reps - Single Arm Serratus Punches in Supine with Dumbbell  - 2 x daily - 7 x weekly - 2 sets - 10 reps  ASSESSMENT:  CLINICAL IMPRESSION: Ms Logan is progressing appropriately.  She was able to completed all tasks today pain free.   We increased her distraction pull to 18lbs on traction today.   Patient would benefit from continued skilled PT to progress towards goal related activities.  OBJECTIVE IMPAIRMENTS: decreased ROM, decreased strength, increased muscle spasms, impaired flexibility, impaired UE functional use, improper body mechanics, postural dysfunction, and pain.   ACTIVITY LIMITATIONS: carrying, lifting, sleeping, and reach over head  PARTICIPATION LIMITATIONS: cleaning, laundry, and community activity  PERSONAL FACTORS: Past/current experiences, Time since onset of injury/illness/exacerbation, and 3+ comorbidities: GBS, Raynaud's, OA, asthma   are also affecting patient's functional outcome.   REHAB POTENTIAL: Good  CLINICAL DECISION MAKING: Evolving/moderate complexity  EVALUATION COMPLEXITY: Moderate   GOALS: Goals  reviewed with patient? Yes  SHORT TERM GOALS: Target date: 09/07/2023  Patient will be independent with initial HEP. Baseline:  Goal status: MET 09/10/23  2.  Patient will improve cervical and shoulder A/ROM to [facility] to allow her to perform various household tasks. Baseline:  Goal status:  MET 09/10/23   LONG TERM GOALS: Target date: 10/12/2023  Patient will be independent with advanced HEP to allow her self-progression after discharge. Baseline:  Goal status: Ongoing  2.  Patient will increase her functional UE strength to [facility] to  allow her to lift items and place in overhead shelves and to lift suitcases. Baseline:  Goal status: Ongoing (09/19/24  patient reports she is finding it easier to reach into overhead cabinets)  3.  Patient will be able to participate in previous exercise classes without increased pain. Baseline:  Goal status: INITIAL  4.  Patient will increase FOTO to 57 to demonstrate improvements in functional tasks. Baseline: 41 Goal status: INITIAL    PLAN:  PT FREQUENCY: 2x/week  PT DURATION: 8 weeks  PLANNED INTERVENTIONS: 97164- PT Re-evaluation, 97110-Therapeutic exercises, 97530- Therapeutic activity, [id number]- Neuromuscular re-education, 97535- Self Care, 57322- Manual therapy, [id number]- Canalith repositioning, [id number]- Aquatic Therapy, 97014- Electrical stimulation (unattended), [id number]- Electrical stimulation (manual), [id number]- Vasopneumatic device, [id number]- Ultrasound, [id number]- Traction (mechanical), [id number]- Ionotophoresis 4mg /ml Dexamethasone, Patient/Family education, Balance training, Taping, Dry Needling, Joint mobilization, Joint manipulation, Spinal manipulation, Spinal mobilization, Vestibular training, Cryotherapy, and Moist heat  PLAN FOR NEXT SESSION: Assess response to increased distraction on cervical traction, try home unit if patient continues to have some relief. Thoracic rotation exercises, shoulder depression exercises, assess and progress HEP as  indicated, strengthening, ROM/flexibility, postural stabilization    Anita Mcadory B. Daiden Coltrane, PT 09/25/23 10:16 PM [facility] [address] [place], [place] [address] Phone # [phone] Fax [phone]

## 2023-09-26 DIAGNOSIS — N952 Postmenopausal atrophic vaginitis: Secondary | ICD-10-CM | POA: Diagnosis not present

## 2023-09-26 DIAGNOSIS — R3121 Asymptomatic microscopic hematuria: Secondary | ICD-10-CM | POA: Diagnosis not present

## 2023-09-26 DIAGNOSIS — N3941 Urge incontinence: Secondary | ICD-10-CM | POA: Diagnosis not present

## 2023-09-27 ENCOUNTER — Ambulatory Visit: Payer: Medicare HMO

## 2023-09-27 DIAGNOSIS — M6281 Muscle weakness (generalized): Secondary | ICD-10-CM

## 2023-09-27 DIAGNOSIS — R262 Difficulty in walking, not elsewhere classified: Secondary | ICD-10-CM | POA: Diagnosis not present

## 2023-09-27 DIAGNOSIS — R252 Cramp and spasm: Secondary | ICD-10-CM | POA: Diagnosis not present

## 2023-09-27 DIAGNOSIS — M542 Cervicalgia: Secondary | ICD-10-CM | POA: Diagnosis not present

## 2023-09-27 DIAGNOSIS — M25511 Pain in right shoulder: Secondary | ICD-10-CM | POA: Diagnosis not present

## 2023-09-27 DIAGNOSIS — R293 Abnormal posture: Secondary | ICD-10-CM | POA: Diagnosis not present

## 2023-09-27 DIAGNOSIS — M25512 Pain in left shoulder: Secondary | ICD-10-CM | POA: Diagnosis not present

## 2023-09-27 NOTE — Therapy (Signed)
OUTPATIENT PHYSICAL THERAPY TREATMENT NOTE Progress Note Reporting Period 08/22/23 to 09/25/23  See note below for Objective Data and Assessment of Progress/Goals.       Patient Name: Brianna Gates MRN: [id number] DOB:[birth year], 72 y.o., female Today's Date: 09/27/2023  END OF SESSION:  PT End of Session - 09/27/23 1456     Visit Number 11    Date for PT Re-Evaluation 10/12/23    Authorization Type [employer]    Progress Note Due on Visit 20    PT Start Time 1448    PT Stop Time 1532    PT Time Calculation (min) 44 min    Activity Tolerance Patient tolerated treatment well    Behavior During Therapy [facility] for tasks assessed/performed              Past Medical History:  Diagnosis Date   Allergy    Anxiety    Asthma    INFREQUENT PROBLEM - rarely used inhaler   CAD (coronary artery disease) 06/29/2023   CCTA 06/27/23: CAC score 15.7 (45th percentile), mild nonobstructive CAD [mid LAD 1-24, proximal LCx 1-24]    Cervical spondylosis    Cervical strain 07/22/2014   Chest pain of uncertain etiology 12/13/2021   Complication of anesthesia    UNKNOWN REACTION WITH RASH AT SITE OF IV only with knee surgery   Degenerative arthritis    Dyslipidemia    Eczema    GERD (gastroesophageal reflux disease)    Guillain-Barre syndrome (HCC)    History of Guillain-Barre syndrome 07/22/2014   Hyperlipidemia    Hypertension    Hypothyroidism    Lumbosacral spondylosis    Palpitations 12/12/2021   Pure hypercholesterolemia 12/13/2021   Raynaud disease    Thyroid nodule    Past Surgical History:  Procedure Laterality Date   ABDOMINAL HYSTERECTOMY     COLONOSCOPY     ESOPHAGOGASTRODUODENOSCOPY (EGD) WITH PROPOFOL N/A 09/13/2021   Procedure: ESOPHAGOGASTRODUODENOSCOPY (EGD) WITH PROPOFOL;  Surgeon: Hilarie Fredrickson, MD;  Location: [facility];  Service: Endoscopy;  Laterality: N/A;   FOOT SURGERY Left    GANGLION CYST EXCISION Left    Wrist   HERNIA REPAIR      KNEE ARTHROSCOPY     RT KNEE   KNEE ARTHROTOMY Right 08/27/2013   Procedure: RIGHT KNEE SCAR EXCISION AND FEMORAL REVISION;  Surgeon: Loanne Drilling, MD;  Location: [facility];  Service: Orthopedics;  Laterality: Right;   REPLACEMENT TOTAL KNEE Right    2014, 2021   REPLACEMENT TOTAL KNEE BILATERAL Bilateral    STRABISMUS SURGERY Right    TONSILLECTOMY     vaginal vulvo prolapse  09/16/2015   Patient Active Problem List   Diagnosis Date Noted   CAD (coronary artery disease) 06/29/2023   Paresthesia 10/18/2022   Onychomycosis 01/29/2022   Chest pain of uncertain etiology 12/13/2021   Pure hypercholesterolemia 12/13/2021   Palpitations 12/12/2021   Esophageal dysphagia    Allergic rhinitis 04/04/2021   Allergic rhinitis due to animal (cat) (dog) hair and dander 04/04/2021   Allergic rhinitis due to pollen 04/04/2021   Hormone deficiency 04/04/2021   Syncope and collapse 12/27/2020   Hypotension 12/27/2020   Bradycardia 12/27/2020   History of COVID-19 12/27/2020   Preoperative evaluation to rule out surgical contraindication 07/16/2020   Arthrofibrosis of knee joint, right 11/26/2019   Arthritis of knee 07/30/2019   Presence of right artificial knee joint 07/30/2019   Sleep apnea 10/16/2018   Shoulder impingement syndrome 01/15/2018   Gastroesophageal  reflux disease 02/02/2016   Sensorineural hearing loss (SNHL), bilateral 02/02/2016   Vaginal vault prolapse after hysterectomy 07/28/2015   History of Guillain-Barre syndrome 07/22/2014   Cervical strain 07/22/2014   Postoperative stiffness of total knee replacement (HCC) 08/27/2013   Abnormality of gait 08/22/2012   Cervical spondylosis without myelopathy 08/22/2012   Muscle weakness (generalized) 08/22/2012   Guillain-Barre syndrome (HCC) 08/22/2012   Essential hypertension 08/22/2012   Strabismus 08/22/2012   Degenerative joint disease of knee, left 08/22/2012   Hypothyroidism 08/22/2012   Raynaud's disease 08/22/2012    Knee joint replacement by other means 08/22/2012    PCP: Chilton Greathouse, MD  REFERRING PROVIDER: Rodolph Bong, MD  REFERRING DIAG: M25.511,G89.29,M25.512 (ICD-10-CM) - Chronic pain of both shoulders M54.2,G89.29 (ICD-10-CM) - Neck pain, chronic  THERAPY DIAG:  Abnormal posture  Cramp and spasm  Cervicalgia  Muscle weakness (generalized)  Bilateral shoulder pain, unspecified chronicity  Rationale for Evaluation and Treatment: Rehabilitation  ONSET DATE: Has had pain for years, but it has gotten worse in past 2-3 months   SUBJECTIVE:                                                                                                                                                                                                         SUBJECTIVE STATEMENT: Patient reports the shoulder seems to be making a good turn now since the cortisone injection.        Hand dominance: Right  PERTINENT HISTORY:  Bilateral TKA (with 2 revisions on right knee), HTN, Raynaud's disease, CAD, hard of hearing (wears hearing aide), asthma, OA  PAIN:   Are you having pain? Yes: NPRS scale: overall 5/10 Pain location: cervical and bilat shoulder Pain description: "just there" Aggravating factors: sleeping, overhead reaching Relieving factors: sometimes medication  PRECAUTIONS: None  RED FLAGS: None     WEIGHT BEARING RESTRICTIONS: No  FALLS:  Has patient fallen in last 6 months?  Has not had a fall, but has tripped recently  LIVING ENVIRONMENT: Lives with: lives with their spouse Lives in: House/apartment Stairs:  one level home with upstairs bonus room above the garage Has following equipment at home: Single point cane, Environmental consultant - 2 wheeled, and Grab bars  OCCUPATION: Retired  [employer]: Independent and Leisure: outdoors, water aerobics, exercise, spending time with family/friends  PATIENT GOALS: To be stronger to be able to lift items around home, like the mattress to change the sheets  easier.  NEXT MD VISIT: Dr Denyse Amass on December 9th  OBJECTIVE:  Note: Objective measures were completed at Evaluation unless  otherwise noted.  DIAGNOSTIC FINDINGS:  Cervical Radiograph on 08/13/2023: IMPRESSION: No acute osseous abnormality. Mild midcervical spine predominant degenerative changes. Assessment of the odontoid view is markedly limited due to technique.  Left shoulder radiograph on 08/13/2023: FINDINGS: There is no evidence of fracture or dislocation. Mild degenerative changes of the [facility] joint. There is no other focal bone abnormality. Soft tissues are unremarkable.  Right shoulder radiograph on 08/13/2023: FINDINGS: Degenerative spurring at the acromioclavicular joint which is narrow. Mild spurring at the [facility] joint. No acute fracture, bone lesion, or subluxation.   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  At times, has numbness and tingling in hands, as well.  POSTURE: rounded shoulders, forward head, and increased thoracic kyphosis  PALPATION: Pt with tight musculature noted in bilateral cervical region   CERVICAL ROM:   Active ROM A/PROM (deg) eval  Flexion 55  Extension 40  Right lateral flexion 20  Left lateral flexion 20  Right rotation 50  Left rotation 45   (Blank rows = not tested)  UPPER EXTREMITY ROM:  Active ROM Right eval Left eval  Shoulder flexion 135 130  Shoulder extension 70 65  Shoulder abduction 137 130   (Blank rows = not tested)  UPPER EXTREMITY MMT:  Eval:   Bilateral shoulder strength grossly 4 to 4+/5 throughout  FUNCTIONAL TESTS:  Eval:   5 times sit to stand: 11.24 sec  TODAY'S TREATMENT:                                                                                                                              DATE: 09/27/2023 UBE x 6 min 3/3 with PT present to  discuss status Trigger Point Dry-Needling  Treatment instructions: Expect mild to moderate muscle soreness. S/S of pneumothorax if dry needled over a lung field, and to seek immediate medical attention should they occur. Patient verbalized understanding of these instructions and education. Patient Consent Given: Yes Education handout provided: Yes Muscles treated: bilateral upper traps Electrical stimulation performed: No Parameters: N/A Treatment response/outcome: Skilled palpation used to identify taut bands and trigger points.  Once identified, dry needling techniques used to treat these areas.  Twitch response ellicited along with palpable elongation of muscle.  Following treatment, patient reported soreness but decreased tension in the muscle.    Cervical Traction:  x10 minutes at 20 lbs with 60 sec hold and 10 sec rest at 100% speed  DATE: 09/25/2023 UBE x 6 min 3/3 with PT present to discuss status 3 way scapular stabilization with yellow loop x 5 each side 4D ball rolls x 20 each direction on each UE Reviewed home exercises including pulleys and went over multiple exercises that she is doing in a class that may be hurting her shoulders.   Cervical Traction:  x10 minutes at 18 lbs with 60  sec hold and 10 sec rest at 100% speed  DATE: 09/20/2023 Nustep level 5 x6 min with PT present to discuss status 3 way scapular stabilization with blue loop x 5 each side 4D ball rolls x 20 each direction on each UE Supine MELT method with head on soft foam roll:  x20 horizontal rotations x20 head nods, with head turned left cervical rotations x 20 Cervical Traction:  x10 minutes at 16 lbs with 60 sec hold and 10 sec rest at 100% speed   PATIENT EDUCATION:  Education details: Issued HEP Person educated: Patient Education method: Explanation, Facilities manager, and Handouts Education comprehension: verbalized understanding  HOME EXERCISE PROGRAM: Access Code: [email or web address] URL:  [email or web address] Date: 08/23/2023 Prepared by: Mikey Kirschner  Exercises - Seated Scapular Retraction  - 1 x daily - 7 x weekly - 2 sets - 10 reps - Seated Cervical Rotation AROM  - 1 x daily - 7 x weekly - 2 sets - 10 reps - Seated Cervical Sidebending AROM  - 1 x daily - 7 x weekly - 2 sets - 10 reps - Seated Cervical Retraction  - 1 x daily - 7 x weekly - 2 sets - 10 reps - Prone Shoulder Extension - Single Arm  - 2 x daily - 7 x weekly - 2 sets - 10 reps - Prone Shoulder Row  - 2 x daily - 7 x weekly - 2 sets - 10 reps - Prone Single Arm Shoulder Horizontal Abduction with Scapular Retraction and Palm Down  - 2 x daily - 7 x weekly - 2 sets - 10 reps - Sidelying Shoulder External Rotation  - 2 x daily - 7 x weekly - 2 sets - 10 reps - Single Arm Serratus Punches in Supine with Dumbbell  - 2 x daily - 7 x weekly - 2 sets - 10 reps  ASSESSMENT:  CLINICAL IMPRESSION: Ms Bough is beginning to feel some relief of the shoulder pain since the injection.  She continues to need vc's to reduce shoulder elevation and tension in the upper traps.  She had several twitch responses with DN today in both upper traps.  We increased her distraction pull to 20 lbs on traction today.   Patient would benefit from continued skilled PT to progress towards goal related activities.  OBJECTIVE IMPAIRMENTS: decreased ROM, decreased strength, increased muscle spasms, impaired flexibility, impaired UE functional use, improper body mechanics, postural dysfunction, and pain.   ACTIVITY LIMITATIONS: carrying, lifting, sleeping, and reach over head  PARTICIPATION LIMITATIONS: cleaning, laundry, and community activity  PERSONAL FACTORS: Past/current experiences, Time since onset of injury/illness/exacerbation, and 3+ comorbidities: GBS, Raynaud's, OA, asthma   are also affecting patient's functional outcome.   REHAB POTENTIAL: Good  CLINICAL DECISION MAKING: Evolving/moderate  complexity  EVALUATION COMPLEXITY: Moderate   GOALS: Goals reviewed with patient? Yes  SHORT TERM GOALS: Target date: 09/07/2023  Patient will be independent with initial HEP. Baseline:  Goal status: MET 09/10/23  2.  Patient will improve cervical and shoulder A/ROM to [facility] to allow her to perform various household tasks. Baseline:  Goal status:  MET 09/10/23   LONG TERM GOALS: Target date: 10/12/2023  Patient will be independent with advanced HEP to allow her self-progression after discharge. Baseline:  Goal status: Ongoing  2.  Patient will increase her functional UE strength to [facility] to allow her to lift items and place in overhead shelves and to lift suitcases. Baseline:  Goal status: MET 09/27/23  3.  Patient will be able  to participate in previous exercise classes without increased pain. Baseline:  Goal status: In progress  4.  Patient will increase FOTO to 57 to demonstrate improvements in functional tasks. Baseline: 41 Goal status: In progress    PLAN:  PT FREQUENCY: 2x/week  PT DURATION: 8 weeks  PLANNED INTERVENTIONS: 97164- PT Re-evaluation, 97110-Therapeutic exercises, 97530- Therapeutic activity, [id number]- Neuromuscular re-education, 97535- Self Care, 16109- Manual therapy, [id number]- Canalith repositioning, [id number]- Aquatic Therapy, 97014- Electrical stimulation (unattended), [id number]- Electrical stimulation (manual), [id number]- Vasopneumatic device, [id number]- Ultrasound, [id number]- Traction (mechanical), [id number]- Ionotophoresis 4mg /ml Dexamethasone, Patient/Family education, Balance training, Taping, Dry Needling, Joint mobilization, Joint manipulation, Spinal manipulation, Spinal mobilization, Vestibular training, Cryotherapy, and Moist heat  PLAN FOR NEXT SESSION: Assess response to increased distraction on cervical traction, introduce home unit if patient continues to have some relief. Thoracic rotation exercises, shoulder depression exercises, assess and progress HEP as  indicated, strengthening, ROM/flexibility, postural stabilization    Darnelle Derrick B. Remingtyn Depaola, PT 09/27/23 3:30 PM [facility] [address], [address] [place], [place] [address] Phone # [phone] Fax [phone]

## 2023-10-02 ENCOUNTER — Encounter: Payer: Self-pay | Admitting: Rehabilitative and Restorative Service Providers"

## 2023-10-02 ENCOUNTER — Ambulatory Visit: Payer: Medicare HMO | Admitting: Rehabilitative and Restorative Service Providers"

## 2023-10-02 DIAGNOSIS — R252 Cramp and spasm: Secondary | ICD-10-CM

## 2023-10-02 DIAGNOSIS — R262 Difficulty in walking, not elsewhere classified: Secondary | ICD-10-CM | POA: Diagnosis not present

## 2023-10-02 DIAGNOSIS — M25511 Pain in right shoulder: Secondary | ICD-10-CM

## 2023-10-02 DIAGNOSIS — M542 Cervicalgia: Secondary | ICD-10-CM

## 2023-10-02 DIAGNOSIS — R293 Abnormal posture: Secondary | ICD-10-CM

## 2023-10-02 DIAGNOSIS — M6281 Muscle weakness (generalized): Secondary | ICD-10-CM

## 2023-10-02 DIAGNOSIS — M25512 Pain in left shoulder: Secondary | ICD-10-CM | POA: Diagnosis not present

## 2023-10-02 NOTE — Therapy (Signed)
OUTPATIENT PHYSICAL THERAPY TREATMENT NOTE     Patient Name: Brianna Gates MRN: [id number] DOB:01-21-1951, 72 y.o., female Today's Date: 10/02/2023  END OF SESSION:  PT End of Session - 10/02/23 1022     Visit Number 12    Date for PT Re-Evaluation 10/12/23    Authorization Type [employer]    Progress Note Due on Visit 20    PT Start Time 1019    PT Stop Time 1100    PT Time Calculation (min) 41 min    Activity Tolerance Patient tolerated treatment well    Behavior During Therapy WFL for tasks assessed/performed              Past Medical History:  Diagnosis Date   Allergy    Anxiety    Asthma    INFREQUENT PROBLEM - rarely used inhaler   CAD (coronary artery disease) 06/29/2023   CCTA 06/27/23: CAC score 15.7 (45th percentile), mild nonobstructive CAD [mid LAD 1-24, proximal LCx 1-24]    Cervical spondylosis    Cervical strain 07/22/2014   Chest pain of uncertain etiology 12/13/2021   Complication of anesthesia    UNKNOWN REACTION WITH RASH AT SITE OF IV only with knee surgery   Degenerative arthritis    Dyslipidemia    Eczema    GERD (gastroesophageal reflux disease)    Guillain-Barre syndrome (HCC)    History of Guillain-Barre syndrome 07/22/2014   Hyperlipidemia    Hypertension    Hypothyroidism    Lumbosacral spondylosis    Palpitations 12/12/2021   Pure hypercholesterolemia 12/13/2021   Raynaud disease    Thyroid nodule    Past Surgical History:  Procedure Laterality Date   ABDOMINAL HYSTERECTOMY     COLONOSCOPY     ESOPHAGOGASTRODUODENOSCOPY (EGD) WITH PROPOFOL N/A 09/13/2021   Procedure: ESOPHAGOGASTRODUODENOSCOPY (EGD) WITH PROPOFOL;  Surgeon: Hilarie Fredrickson, MD;  Location: [facility];  Service: Endoscopy;  Laterality: N/A;   FOOT SURGERY Left    GANGLION CYST EXCISION Left    Wrist   HERNIA REPAIR     KNEE ARTHROSCOPY     RT KNEE   KNEE ARTHROTOMY Right 08/27/2013   Procedure: RIGHT KNEE SCAR EXCISION AND FEMORAL REVISION;   Surgeon: Loanne Drilling, MD;  Location: [facility];  Service: Orthopedics;  Laterality: Right;   REPLACEMENT TOTAL KNEE Right    2014, 2021   REPLACEMENT TOTAL KNEE BILATERAL Bilateral    STRABISMUS SURGERY Right    TONSILLECTOMY     vaginal vulvo prolapse  09/16/2015   Patient Active Problem List   Diagnosis Date Noted   CAD (coronary artery disease) 06/29/2023   Paresthesia 10/18/2022   Onychomycosis 01/29/2022   Chest pain of uncertain etiology 12/13/2021   Pure hypercholesterolemia 12/13/2021   Palpitations 12/12/2021   Esophageal dysphagia    Allergic rhinitis 04/04/2021   Allergic rhinitis due to animal (cat) (dog) hair and dander 04/04/2021   Allergic rhinitis due to pollen 04/04/2021   Hormone deficiency 04/04/2021   Syncope and collapse 12/27/2020   Hypotension 12/27/2020   Bradycardia 12/27/2020   History of COVID-19 12/27/2020   Preoperative evaluation to rule out surgical contraindication 07/16/2020   Arthrofibrosis of knee joint, right 11/26/2019   Arthritis of knee 07/30/2019   Presence of right artificial knee joint 07/30/2019   Sleep apnea 10/16/2018   Shoulder impingement syndrome 01/15/2018   Gastroesophageal reflux disease 02/02/2016   Sensorineural hearing loss (SNHL), bilateral 02/02/2016   Vaginal vault prolapse after hysterectomy 07/28/2015  History of Guillain-Barre syndrome 07/22/2014   Cervical strain 07/22/2014   Postoperative stiffness of total knee replacement (HCC) 08/27/2013   Abnormality of gait 08/22/2012   Cervical spondylosis without myelopathy 08/22/2012   Muscle weakness (generalized) 08/22/2012   Guillain-Barre syndrome (HCC) 08/22/2012   Essential hypertension 08/22/2012   Strabismus 08/22/2012   Degenerative joint disease of knee, left 08/22/2012   Hypothyroidism 08/22/2012   Raynaud's disease 08/22/2012   Knee joint replacement by other means 08/22/2012    PCP: Chilton Greathouse, MD  REFERRING PROVIDER: Rodolph Bong,  MD  REFERRING DIAG: M25.511,G89.29,M25.512 (ICD-10-CM) - Chronic pain of both shoulders M54.2,G89.29 (ICD-10-CM) - Neck pain, chronic  THERAPY DIAG:  Abnormal posture  Cramp and spasm  Cervicalgia  Muscle weakness (generalized)  Bilateral shoulder pain, unspecified chronicity  Rationale for Evaluation and Treatment: Rehabilitation  ONSET DATE: Has had pain for years, but it has gotten worse in past 2-3 months   SUBJECTIVE:                                                                                                                                                                                                         SUBJECTIVE STATEMENT: Patient reports that her knees seem to be hurting more than her shoulders/cervical region.  States that she had good results from the injection for a couple of days, but the pain has started coming back some     Hand dominance: Right  PERTINENT HISTORY:  Bilateral TKA (with 2 revisions on right knee), HTN, Raynaud's disease, CAD, hard of hearing (wears hearing aide), asthma, OA  PAIN:   Are you having pain? Yes: NPRS scale: 4-5/10 Pain location: cervical and bilat shoulder Pain description: "just there" Aggravating factors: sleeping, overhead reaching Relieving factors: sometimes medication  PRECAUTIONS: None  RED FLAGS: None     WEIGHT BEARING RESTRICTIONS: No  FALLS:  Has patient fallen in last 6 months?  Has not had a fall, but has tripped recently  LIVING ENVIRONMENT: Lives with: lives with their spouse Lives in: House/apartment Stairs:  one level home with upstairs bonus room above the garage Has following equipment at home: Single point cane, Environmental consultant - 2 wheeled, and Grab bars  OCCUPATION: Retired  [employer]: Independent and Leisure: outdoors, water aerobics, exercise, spending time with family/friends  PATIENT GOALS: To be stronger to be able to lift items around home, like the mattress to change the sheets  easier.  NEXT MD VISIT: Dr Denyse Amass on December 9th  OBJECTIVE:  Note: Objective measures were completed at Evaluation unless otherwise noted.  DIAGNOSTIC  FINDINGS:  Cervical Radiograph on 08/13/2023: IMPRESSION: No acute osseous abnormality. Mild midcervical spine predominant degenerative changes. Assessment of the odontoid view is markedly limited due to technique.  Left shoulder radiograph on 08/13/2023: FINDINGS: There is no evidence of fracture or dislocation. Mild degenerative changes of the [facility] joint. There is no other focal bone abnormality. Soft tissues are unremarkable.  Right shoulder radiograph on 08/13/2023: FINDINGS: Degenerative spurring at the acromioclavicular joint which is narrow. Mild spurring at the [facility] joint. No acute fracture, bone lesion, or subluxation.   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  At times, has numbness and tingling in hands, as well.  POSTURE: rounded shoulders, forward head, and increased thoracic kyphosis  PALPATION: Pt with tight musculature noted in bilateral cervical region   CERVICAL ROM:   Active ROM A/PROM (deg) eval  Flexion 55  Extension 40  Right lateral flexion 20  Left lateral flexion 20  Right rotation 50  Left rotation 45   (Blank rows = not tested)  UPPER EXTREMITY ROM:  Active ROM Right eval Left eval  Shoulder flexion 135 130  Shoulder extension 70 65  Shoulder abduction 137 130   (Blank rows = not tested)  UPPER EXTREMITY MMT:  Eval:   Bilateral shoulder strength grossly 4 to 4+/5 throughout  FUNCTIONAL TESTS:  Eval:   5 times sit to stand: 11.24 sec  TODAY'S TREATMENT:                                                                                                                               DATE: 10/02/2023 Nustep level 3  x6 min with PT present to discuss status Supine MELT method with head on soft foam roll:  x20 horizontal rotations x20 head nods FOTO Seated levator stretch 2x20 sec bilat Supine shoulder flexion with 3# on cane 2x10 Supine shoulder chest press with 3# on cane 2x10 Supine shoulder/arm isometric press into mat 2x10 Manual Therapy:  soft tissue mobilization to bilateral cervical and scapular/shoulder region with manual trigger point release to areas where trigger point palpated.  Manual suboccipital release bilaterally.  To promote overall decreased pain and improved tissue elongation.   DATE: 09/27/2023 UBE x 6 min 3/3 with PT present to discuss status Trigger Point Dry-Needling  Treatment instructions: Expect mild to moderate muscle soreness. S/S of pneumothorax if dry needled over a lung field, and to seek immediate medical attention should they occur. Patient verbalized understanding of these instructions and education. Patient Consent Given: Yes Education handout provided: Yes Muscles treated: bilateral upper traps Electrical stimulation performed: No Parameters: N/A Treatment response/outcome: Skilled palpation used to identify taut bands and trigger points.  Once identified, dry needling techniques used to treat these areas.  Twitch response ellicited along with palpable elongation of muscle.  Following treatment,  patient reported soreness but decreased tension in the muscle.    Cervical Traction:  x10 minutes at 20 lbs with 60 sec hold and 10 sec rest at 100% speed  DATE: 09/25/2023 UBE x 6 min 3/3 with PT present to discuss status 3 way scapular stabilization with yellow loop x 5 each side 4D ball rolls x 20 each direction on each UE Reviewed home exercises including pulleys and went over multiple exercises that she is doing in a class that may be hurting her shoulders.   Cervical Traction:  x10 minutes at 18 lbs with 60 sec hold and 10 sec rest at 100% speed   PATIENT EDUCATION:   Education details: Issued HEP Person educated: Patient Education method: Explanation, Facilities manager, and Handouts Education comprehension: verbalized understanding  HOME EXERCISE PROGRAM: Access Code: [email or web address] URL: [email or web address] Date: 08/23/2023 Prepared by: Mikey Kirschner  Exercises - Seated Scapular Retraction  - 1 x daily - 7 x weekly - 2 sets - 10 reps - Seated Cervical Rotation AROM  - 1 x daily - 7 x weekly - 2 sets - 10 reps - Seated Cervical Sidebending AROM  - 1 x daily - 7 x weekly - 2 sets - 10 reps - Seated Cervical Retraction  - 1 x daily - 7 x weekly - 2 sets - 10 reps - Prone Shoulder Extension - Single Arm  - 2 x daily - 7 x weekly - 2 sets - 10 reps - Prone Shoulder Row  - 2 x daily - 7 x weekly - 2 sets - 10 reps - Prone Single Arm Shoulder Horizontal Abduction with Scapular Retraction and Palm Down  - 2 x daily - 7 x weekly - 2 sets - 10 reps - Sidelying Shoulder External Rotation  - 2 x daily - 7 x weekly - 2 sets - 10 reps - Single Arm Serratus Punches in Supine with Dumbbell  - 2 x daily - 7 x weekly - 2 sets - 10 reps  ASSESSMENT:  CLINICAL IMPRESSION: Ms Brazel reporting some relief, but states that overall, she is still having the same cervical pain.  Patient states that the shoulder pain is coming back some since the initial benefits of the injection.  Patient agreeable to trying manual therapy today to assess if she will have any longer lasting results than with dry needling and with cervical traction.  Patient did report feeling increased flexibility and tissue mobility following soft tissue mobilization with manual trigger point release.  Patient with increased tissue suppleness palpated at the end of session.  Patient with improved score noted on FOTO and has nearly met the anticipated goal.  Patient continues to require skilled PT to progress with goal related activities and decreased pain and increased functional strength and  use.  OBJECTIVE IMPAIRMENTS: decreased ROM, decreased strength, increased muscle spasms, impaired flexibility, impaired UE functional use, improper body mechanics, postural dysfunction, and pain.   ACTIVITY LIMITATIONS: carrying, lifting, sleeping, and reach over head  PARTICIPATION LIMITATIONS: cleaning, laundry, and community activity  PERSONAL FACTORS: Past/current experiences, Time since onset of injury/illness/exacerbation, and 3+ comorbidities: GBS, Raynaud's, OA, asthma   are also affecting patient's functional outcome.   REHAB POTENTIAL: Good  CLINICAL DECISION MAKING: Evolving/moderate complexity  EVALUATION COMPLEXITY: Moderate   GOALS: Goals reviewed with patient? Yes  SHORT TERM GOALS: Target date: 09/07/2023  Patient will be independent with initial HEP. Baseline:  Goal status: MET 09/10/23  2.  Patient will improve cervical and shoulder A/ROM to [facility] to  allow her to perform various household tasks. Baseline:  Goal status:  MET 09/10/23   LONG TERM GOALS: Target date: 10/12/2023  Patient will be independent with advanced HEP to allow her self-progression after discharge. Baseline:  Goal status: Ongoing  2.  Patient will increase her functional UE strength to [facility] to allow her to lift items and place in overhead shelves and to lift suitcases. Baseline:  Goal status: MET 09/27/23  3.  Patient will be able to participate in previous exercise classes without increased pain. Baseline:  Goal status: In progress  4.  Patient will increase FOTO to 57 to demonstrate improvements in functional tasks. Baseline: 41 Goal status: Ongoing (see above)    PLAN:  PT FREQUENCY: 2x/week  PT DURATION: 8 weeks  PLANNED INTERVENTIONS: 97164- PT Re-evaluation, 97110-Therapeutic exercises, 97530- Therapeutic activity, [id number]- Neuromuscular re-education, 97535- Self Care, [id number]- Manual therapy, [id number]- Canalith repositioning, [id number]- Aquatic Therapy, 97014- Electrical  stimulation (unattended), [id number]- Electrical stimulation (manual), [id number]- Vasopneumatic device, [id number]- Ultrasound, [id number]- Traction (mechanical), [id number]- Ionotophoresis 4mg /ml Dexamethasone, Patient/Family education, Balance training, Taping, Dry Needling, Joint mobilization, Joint manipulation, Spinal manipulation, Spinal mobilization, Vestibular training, Cryotherapy, and Moist heat  PLAN FOR NEXT SESSION: Assess response to manual therapy, introduce home unit if patient continues to have some relief. Thoracic rotation exercises, shoulder depression exercises, assess and progress HEP as indicated, strengthening, ROM/flexibility, postural stabilization   Reather Laurence, PT, DPT 10/02/23, 11:18 AM  [facility] [address], [address] [place], [place] [address] Phone # [phone] Fax [phone]

## 2023-10-03 DIAGNOSIS — H02882 Meibomian gland dysfunction right lower eyelid: Secondary | ICD-10-CM | POA: Diagnosis not present

## 2023-10-03 DIAGNOSIS — H04123 Dry eye syndrome of bilateral lacrimal glands: Secondary | ICD-10-CM | POA: Diagnosis not present

## 2023-10-03 DIAGNOSIS — H02885 Meibomian gland dysfunction left lower eyelid: Secondary | ICD-10-CM | POA: Diagnosis not present

## 2023-10-04 ENCOUNTER — Ambulatory Visit: Payer: Medicare HMO

## 2023-10-04 DIAGNOSIS — R293 Abnormal posture: Secondary | ICD-10-CM

## 2023-10-04 DIAGNOSIS — M25511 Pain in right shoulder: Secondary | ICD-10-CM

## 2023-10-04 DIAGNOSIS — M6281 Muscle weakness (generalized): Secondary | ICD-10-CM | POA: Diagnosis not present

## 2023-10-04 DIAGNOSIS — M542 Cervicalgia: Secondary | ICD-10-CM | POA: Diagnosis not present

## 2023-10-04 DIAGNOSIS — R252 Cramp and spasm: Secondary | ICD-10-CM

## 2023-10-04 DIAGNOSIS — R262 Difficulty in walking, not elsewhere classified: Secondary | ICD-10-CM | POA: Diagnosis not present

## 2023-10-04 DIAGNOSIS — M25512 Pain in left shoulder: Secondary | ICD-10-CM | POA: Diagnosis not present

## 2023-10-04 NOTE — Therapy (Signed)
OUTPATIENT PHYSICAL THERAPY TREATMENT NOTE     Patient Name: Brianna Gates MRN: [id number] DOB:12/16/1950, 72 y.o., female Today's Date: 10/04/2023  END OF SESSION:  PT End of Session - 10/04/23 1410     Visit Number 13    Date for PT Re-Evaluation 10/12/23    Authorization Type [employer]    Progress Note Due on Visit 20    PT Start Time 1400    PT Stop Time 1445    PT Time Calculation (min) 45 min    Activity Tolerance Patient tolerated treatment well    Behavior During Therapy [facility] for tasks assessed/performed              Past Medical History:  Diagnosis Date   Allergy    Anxiety    Asthma    INFREQUENT PROBLEM - rarely used inhaler   CAD (coronary artery disease) 06/29/2023   CCTA 06/27/23: CAC score 15.7 (45th percentile), mild nonobstructive CAD [mid LAD 1-24, proximal LCx 1-24]    Cervical spondylosis    Cervical strain 07/22/2014   Chest pain of uncertain etiology 12/13/2021   Complication of anesthesia    UNKNOWN REACTION WITH RASH AT SITE OF IV only with knee surgery   Degenerative arthritis    Dyslipidemia    Eczema    GERD (gastroesophageal reflux disease)    Guillain-Barre syndrome (HCC)    History of Guillain-Barre syndrome 07/22/2014   Hyperlipidemia    Hypertension    Hypothyroidism    Lumbosacral spondylosis    Palpitations 12/12/2021   Pure hypercholesterolemia 12/13/2021   Raynaud disease    Thyroid nodule    Past Surgical History:  Procedure Laterality Date   ABDOMINAL HYSTERECTOMY     COLONOSCOPY     ESOPHAGOGASTRODUODENOSCOPY (EGD) WITH PROPOFOL N/A 09/13/2021   Procedure: ESOPHAGOGASTRODUODENOSCOPY (EGD) WITH PROPOFOL;  Surgeon: Hilarie Fredrickson, MD;  Location: [facility];  Service: Endoscopy;  Laterality: N/A;   FOOT SURGERY Left    GANGLION CYST EXCISION Left    Wrist   HERNIA REPAIR     KNEE ARTHROSCOPY     RT KNEE   KNEE ARTHROTOMY Right 08/27/2013   Procedure: RIGHT KNEE SCAR EXCISION AND FEMORAL REVISION;   Surgeon: Loanne Drilling, MD;  Location: [facility];  Service: Orthopedics;  Laterality: Right;   REPLACEMENT TOTAL KNEE Right    2014, 2021   REPLACEMENT TOTAL KNEE BILATERAL Bilateral    STRABISMUS SURGERY Right    TONSILLECTOMY     vaginal vulvo prolapse  09/16/2015   Patient Active Problem List   Diagnosis Date Noted   CAD (coronary artery disease) 06/29/2023   Paresthesia 10/18/2022   Onychomycosis 01/29/2022   Chest pain of uncertain etiology 12/13/2021   Pure hypercholesterolemia 12/13/2021   Palpitations 12/12/2021   Esophageal dysphagia    Allergic rhinitis 04/04/2021   Allergic rhinitis due to animal (cat) (dog) hair and dander 04/04/2021   Allergic rhinitis due to pollen 04/04/2021   Hormone deficiency 04/04/2021   Syncope and collapse 12/27/2020   Hypotension 12/27/2020   Bradycardia 12/27/2020   History of COVID-19 12/27/2020   Preoperative evaluation to rule out surgical contraindication 07/16/2020   Arthrofibrosis of knee joint, right 11/26/2019   Arthritis of knee 07/30/2019   Presence of right artificial knee joint 07/30/2019   Sleep apnea 10/16/2018   Shoulder impingement syndrome 01/15/2018   Gastroesophageal reflux disease 02/02/2016   Sensorineural hearing loss (SNHL), bilateral 02/02/2016   Vaginal vault prolapse after hysterectomy 07/28/2015  History of Guillain-Barre syndrome 07/22/2014   Cervical strain 07/22/2014   Postoperative stiffness of total knee replacement (HCC) 08/27/2013   Abnormality of gait 08/22/2012   Cervical spondylosis without myelopathy 08/22/2012   Muscle weakness (generalized) 08/22/2012   Guillain-Barre syndrome (HCC) 08/22/2012   Essential hypertension 08/22/2012   Strabismus 08/22/2012   Degenerative joint disease of knee, left 08/22/2012   Hypothyroidism 08/22/2012   Raynaud's disease 08/22/2012   Knee joint replacement by other means 08/22/2012    PCP: Chilton Greathouse, MD  REFERRING PROVIDER: Rodolph Bong,  MD  REFERRING DIAG: M25.511,G89.29,M25.512 (ICD-10-CM) - Chronic pain of both shoulders M54.2,G89.29 (ICD-10-CM) - Neck pain, chronic  THERAPY DIAG:  Abnormal posture  Cramp and spasm  Cervicalgia  Muscle weakness (generalized)  Bilateral shoulder pain, unspecified chronicity  Rationale for Evaluation and Treatment: Rehabilitation  ONSET DATE: Has had pain for years, but it has gotten worse in past 2-3 months   SUBJECTIVE:                                                                                                                                                                                                         SUBJECTIVE STATEMENT: Patient reports that her knees are still bothersome but she thinks this is due to being up on her feet and doing a lot of cooking.  My neck is stiff and a little sore due to I drove to [place] and did a lot of shopping.  The massage made me feel so good but the usual soreness came back the next day or so.  I do think that my shoulders are doing better.  I can reach into cabinets and I'm not having pain when I roll over onto my side.    Hand dominance: Right  PERTINENT HISTORY:  Bilateral TKA (with 2 revisions on right knee), HTN, Raynaud's disease, CAD, hard of hearing (wears hearing aide), asthma, OA  PAIN:   Are you having pain? Yes: NPRS scale: 4-5/10 Pain location: cervical and bilat shoulder Pain description: "just there" Aggravating factors: sleeping, overhead reaching Relieving factors: sometimes medication  PRECAUTIONS: None  RED FLAGS: None     WEIGHT BEARING RESTRICTIONS: No  FALLS:  Has patient fallen in last 6 months?  Has not had a fall, but has tripped recently  LIVING ENVIRONMENT: Lives with: lives with their spouse Lives in: House/apartment Stairs:  one level home with upstairs bonus room above the garage Has following equipment at home: Single point cane, Environmental consultant - 2 wheeled, and Grab bars  OCCUPATION:  Retired  PLOF: Independent  and Leisure: outdoors, water aerobics, exercise, spending time with family/friends  PATIENT GOALS: To be stronger to be able to lift items around home, like the mattress to change the sheets easier.  NEXT MD VISIT: Dr Denyse Amass on December 9th  OBJECTIVE:  Note: Objective measures were completed at Evaluation unless otherwise noted.  DIAGNOSTIC FINDINGS:  Cervical Radiograph on 08/13/2023: IMPRESSION: No acute osseous abnormality. Mild midcervical spine predominant degenerative changes. Assessment of the odontoid view is markedly limited due to technique.  Left shoulder radiograph on 08/13/2023: FINDINGS: There is no evidence of fracture or dislocation. Mild degenerative changes of the [facility] joint. There is no other focal bone abnormality. Soft tissues are unremarkable.  Right shoulder radiograph on 08/13/2023: FINDINGS: Degenerative spurring at the acromioclavicular joint which is narrow. Mild spurring at the [facility] joint. No acute fracture, bone lesion, or subluxation.   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  At times, has numbness and tingling in hands, as well.  POSTURE: rounded shoulders, forward head, and increased thoracic kyphosis  PALPATION: Pt with tight musculature noted in bilateral cervical region   CERVICAL ROM:   Active ROM A/PROM (deg) eval  Flexion 55  Extension 40  Right lateral flexion 20  Left lateral flexion 20  Right rotation 50  Left rotation 45   (Blank rows = not tested)  UPPER EXTREMITY ROM:  Active ROM Right eval Left eval  Shoulder flexion 135 130  Shoulder extension 70 65  Shoulder abduction 137 130   (Blank rows = not tested)  UPPER EXTREMITY MMT:  Eval:   Bilateral shoulder strength grossly 4 to 4+/5 throughout  FUNCTIONAL TESTS:   Eval:   5 times sit to stand: 11.24 sec  TODAY'S TREATMENT:                                                                                                                              DATE: 10/04/2023 UBE x 6 min (PT present to discuss 3 way scapular stabilization with yellow loop x 5 each side 4D ball rolls x 20 each direction on each UE Reviewed shoulder exercises: (prone series) Trigger Point Dry-Needling  Treatment instructions: Expect mild to moderate muscle soreness. S/S of pneumothorax if dry needled over a lung field, and to seek immediate medical attention should they occur. Patient verbalized understanding of these instructions and education. Patient Consent Given: Yes Education handout provided: Yes Muscles treated: bilateral upper traps and levators, bilateral cervical multifidi, bilateral cervical sub occipital areas Electrical stimulation performed: No Parameters: N/A Treatment response/outcome: Skilled palpation used to identify taut bands and trigger points.  Once identified, dry needling techniques used to treat these areas.  Twitch response ellicited along with palpable elongation of muscle.  Following treatment,  patient was able to rotate left and right with increased ease  and reported decreased pain in right sub occipital area.    DATE: 10/02/2023 Nustep level 3 x6 min with PT present to discuss status Supine MELT method with head on soft foam roll:  x20 horizontal rotations x20 head nods FOTO Seated levator stretch 2x20 sec bilat Supine shoulder flexion with 3# on cane 2x10 Supine shoulder chest press with 3# on cane 2x10 Supine shoulder/arm isometric press into mat 2x10 Manual Therapy:  soft tissue mobilization to bilateral cervical and scapular/shoulder region with manual trigger point release to areas where trigger point palpated.  Manual suboccipital release bilaterally.  To promote overall decreased pain and improved tissue elongation.   DATE:  09/27/2023 UBE x 6 min 3/3 with PT present to discuss status Trigger Point Dry-Needling  Treatment instructions: Expect mild to moderate muscle soreness. S/S of pneumothorax if dry needled over a lung field, and to seek immediate medical attention should they occur. Patient verbalized understanding of these instructions and education. Patient Consent Given: Yes Education handout provided: Yes Muscles treated: bilateral upper traps Electrical stimulation performed: No Parameters: N/A Treatment response/outcome: Skilled palpation used to identify taut bands and trigger points.  Once identified, dry needling techniques used to treat these areas.  Twitch response ellicited along with palpable elongation of muscle.  Following treatment, patient reported soreness but decreased tension in the muscle.    Cervical Traction:  x10 minutes at 20 lbs with 60 sec hold and 10 sec rest at 100% speed  DATE: 09/25/2023 UBE x 6 min 3/3 with PT present to discuss status 3 way scapular stabilization with yellow loop x 5 each side 4D ball rolls x 20 each direction on each UE Reviewed home exercises including pulleys and went over multiple exercises that she is doing in a class that may be hurting her shoulders.   Cervical Traction:  x10 minutes at 18 lbs with 60 sec hold and 10 sec rest at 100% speed   PATIENT EDUCATION:  Education details: Issued HEP Person educated: Patient Education method: Explanation, Facilities manager, and Handouts Education comprehension: verbalized understanding  HOME EXERCISE PROGRAM: Access Code: [email or web address] URL: [email or web address] Date: 08/23/2023 Prepared by: Mikey Kirschner  Exercises - Seated Scapular Retraction  - 1 x daily - 7 x weekly - 2 sets - 10 reps - Seated Cervical Rotation AROM  - 1 x daily - 7 x weekly - 2 sets - 10 reps - Seated Cervical Sidebending AROM  - 1 x daily - 7 x weekly - 2 sets - 10 reps - Seated Cervical Retraction  - 1 x daily - 7 x  weekly - 2 sets - 10 reps - Prone Shoulder Extension - Single Arm  - 2 x daily - 7 x weekly - 2 sets - 10 reps - Prone Shoulder Row  - 2 x daily - 7 x weekly - 2 sets - 10 reps - Prone Single Arm Shoulder Horizontal Abduction with Scapular Retraction and Palm Down  - 2 x daily - 7 x weekly - 2 sets - 10 reps - Sidelying Shoulder External Rotation  - 2 x daily - 7 x weekly - 2 sets - 10 reps - Single Arm Serratus Punches in Supine with Dumbbell  - 2 x daily - 7 x weekly - 2 sets - 10 reps  ASSESSMENT:  CLINICAL IMPRESSION: Ms Hinson is benefiting from the shoulder rehab.  She confirms that she is able to reach overhead with increased ease.  At times, she seems to think she is not progressing but she  is also involved in a fairly aggressive group exercise class where she is doing some shoulder exercises such as lateral raises that would promote impingement in shoulders that do not have sufficient rotator cuff strength.  We have discussed modifications and what to avoid.  We attempted DN to cervical multifidi and sub occipital areas today as she felt the massage to these areas were helpful.  She reported relief post treatment.  We will proceed based on patients response to the DN.  She would benefit from continued skilled PT for shoulder and postural strengthening along with pain control and manual techniques.    OBJECTIVE IMPAIRMENTS: decreased ROM, decreased strength, increased muscle spasms, impaired flexibility, impaired UE functional use, improper body mechanics, postural dysfunction, and pain.   ACTIVITY LIMITATIONS: carrying, lifting, sleeping, and reach over head  PARTICIPATION LIMITATIONS: cleaning, laundry, and community activity  PERSONAL FACTORS: Past/current experiences, Time since onset of injury/illness/exacerbation, and 3+ comorbidities: GBS, Raynaud's, OA, asthma   are also affecting patient's functional outcome.   REHAB POTENTIAL: Good  CLINICAL DECISION MAKING:  Evolving/moderate complexity  EVALUATION COMPLEXITY: Moderate   GOALS: Goals reviewed with patient? Yes  SHORT TERM GOALS: Target date: 09/07/2023  Patient will be independent with initial HEP. Baseline:  Goal status: MET 09/10/23  2.  Patient will improve cervical and shoulder A/ROM to [facility] to allow her to perform various household tasks. Baseline:  Goal status:  MET 09/10/23   LONG TERM GOALS: Target date: 10/12/2023  Patient will be independent with advanced HEP to allow her self-progression after discharge. Baseline:  Goal status: Ongoing  2.  Patient will increase her functional UE strength to [facility] to allow her to lift items and place in overhead shelves and to lift suitcases. Baseline:  Goal status: MET 09/27/23  3.  Patient will be able to participate in previous exercise classes without increased pain. Baseline:  Goal status: In progress  4.  Patient will increase FOTO to 57 to demonstrate improvements in functional tasks. Baseline: 41 Goal status: Ongoing (see above)    PLAN:  PT FREQUENCY: 2x/week  PT DURATION: 8 weeks  PLANNED INTERVENTIONS: 97164- PT Re-evaluation, 97110-Therapeutic exercises, 97530- Therapeutic activity, 97112- Neuromuscular re-education, 97535- Self Care, 91478- Manual therapy, [id number]- Canalith repositioning, [id number]- Aquatic Therapy, 97014- Electrical stimulation (unattended), [id number]- Electrical stimulation (manual), [id number]- Vasopneumatic device, [id number]- Ultrasound, [id number]- Traction (mechanical), [id number]- Ionotophoresis 4mg /ml Dexamethasone, Patient/Family education, Balance training, Taping, Dry Needling, Joint mobilization, Joint manipulation, Spinal manipulation, Spinal mobilization, Vestibular training, Cryotherapy, and Moist heat  PLAN FOR NEXT SESSION: Continue shoulder strengthening and scapular stabilization for the right shoulder.  Thoracic rotation exercises, shoulder depression exercises, assess and progress HEP as indicated,  strengthening, ROM/flexibility, postural stabilization   Sister Carbone B. Niamh Rada, PT 10/04/23 5:30 PM [facility] [address] [place], [place] [address] Phone # [phone] Fax [phone]

## 2023-10-05 DIAGNOSIS — J3081 Allergic rhinitis due to animal (cat) (dog) hair and dander: Secondary | ICD-10-CM | POA: Diagnosis not present

## 2023-10-05 DIAGNOSIS — J3089 Other allergic rhinitis: Secondary | ICD-10-CM | POA: Diagnosis not present

## 2023-10-08 ENCOUNTER — Ambulatory Visit: Payer: Medicare HMO

## 2023-10-08 DIAGNOSIS — R293 Abnormal posture: Secondary | ICD-10-CM | POA: Diagnosis not present

## 2023-10-08 DIAGNOSIS — R252 Cramp and spasm: Secondary | ICD-10-CM

## 2023-10-08 DIAGNOSIS — M542 Cervicalgia: Secondary | ICD-10-CM | POA: Diagnosis not present

## 2023-10-08 DIAGNOSIS — M6281 Muscle weakness (generalized): Secondary | ICD-10-CM | POA: Diagnosis not present

## 2023-10-08 DIAGNOSIS — M25511 Pain in right shoulder: Secondary | ICD-10-CM

## 2023-10-08 DIAGNOSIS — M25512 Pain in left shoulder: Secondary | ICD-10-CM | POA: Diagnosis not present

## 2023-10-08 DIAGNOSIS — R262 Difficulty in walking, not elsewhere classified: Secondary | ICD-10-CM | POA: Diagnosis not present

## 2023-10-08 NOTE — Therapy (Signed)
OUTPATIENT PHYSICAL THERAPY TREATMENT NOTE     Patient Name: Brianna Gates MRN: [id number] DOB:Aug 13, 1951, 72 y.o., female Today's Date: 10/08/2023  END OF SESSION:  PT End of Session - 10/08/23 1413     Visit Number 14    Date for PT Re-Evaluation 10/12/23    Authorization Type [employer]    Progress Note Due on Visit 20    PT Start Time 1410    PT Stop Time 1455    PT Time Calculation (min) 45 min    Activity Tolerance Patient tolerated treatment well    Behavior During Therapy [facility] for tasks assessed/performed              Past Medical History:  Diagnosis Date   Allergy    Anxiety    Asthma    INFREQUENT PROBLEM - rarely used inhaler   CAD (coronary artery disease) 06/29/2023   CCTA 06/27/23: CAC score 15.7 (45th percentile), mild nonobstructive CAD [mid LAD 1-24, proximal LCx 1-24]    Cervical spondylosis    Cervical strain 07/22/2014   Chest pain of uncertain etiology 12/13/2021   Complication of anesthesia    UNKNOWN REACTION WITH RASH AT SITE OF IV only with knee surgery   Degenerative arthritis    Dyslipidemia    Eczema    GERD (gastroesophageal reflux disease)    Guillain-Barre syndrome (HCC)    History of Guillain-Barre syndrome 07/22/2014   Hyperlipidemia    Hypertension    Hypothyroidism    Lumbosacral spondylosis    Palpitations 12/12/2021   Pure hypercholesterolemia 12/13/2021   Raynaud disease    Thyroid nodule    Past Surgical History:  Procedure Laterality Date   ABDOMINAL HYSTERECTOMY     COLONOSCOPY     ESOPHAGOGASTRODUODENOSCOPY (EGD) WITH PROPOFOL N/A 09/13/2021   Procedure: ESOPHAGOGASTRODUODENOSCOPY (EGD) WITH PROPOFOL;  Surgeon: Hilarie Fredrickson, MD;  Location: [facility];  Service: Endoscopy;  Laterality: N/A;   FOOT SURGERY Left    GANGLION CYST EXCISION Left    Wrist   HERNIA REPAIR     KNEE ARTHROSCOPY     RT KNEE   KNEE ARTHROTOMY Right 08/27/2013   Procedure: RIGHT KNEE SCAR EXCISION AND FEMORAL REVISION;   Surgeon: Loanne Drilling, MD;  Location: [facility];  Service: Orthopedics;  Laterality: Right;   REPLACEMENT TOTAL KNEE Right    2014, 2021   REPLACEMENT TOTAL KNEE BILATERAL Bilateral    STRABISMUS SURGERY Right    TONSILLECTOMY     vaginal vulvo prolapse  09/16/2015   Patient Active Problem List   Diagnosis Date Noted   CAD (coronary artery disease) 06/29/2023   Paresthesia 10/18/2022   Onychomycosis 01/29/2022   Chest pain of uncertain etiology 12/13/2021   Pure hypercholesterolemia 12/13/2021   Palpitations 12/12/2021   Esophageal dysphagia    Allergic rhinitis 04/04/2021   Allergic rhinitis due to animal (cat) (dog) hair and dander 04/04/2021   Allergic rhinitis due to pollen 04/04/2021   Hormone deficiency 04/04/2021   Syncope and collapse 12/27/2020   Hypotension 12/27/2020   Bradycardia 12/27/2020   History of COVID-19 12/27/2020   Preoperative evaluation to rule out surgical contraindication 07/16/2020   Arthrofibrosis of knee joint, right 11/26/2019   Arthritis of knee 07/30/2019   Presence of right artificial knee joint 07/30/2019   Sleep apnea 10/16/2018   Shoulder impingement syndrome 01/15/2018   Gastroesophageal reflux disease 02/02/2016   Sensorineural hearing loss (SNHL), bilateral 02/02/2016   Vaginal vault prolapse after hysterectomy 07/28/2015  History of Guillain-Barre syndrome 07/22/2014   Cervical strain 07/22/2014   Postoperative stiffness of total knee replacement (HCC) 08/27/2013   Abnormality of gait 08/22/2012   Cervical spondylosis without myelopathy 08/22/2012   Muscle weakness (generalized) 08/22/2012   Guillain-Barre syndrome (HCC) 08/22/2012   Essential hypertension 08/22/2012   Strabismus 08/22/2012   Degenerative joint disease of knee, left 08/22/2012   Hypothyroidism 08/22/2012   Raynaud's disease 08/22/2012   Knee joint replacement by other means 08/22/2012    PCP: Chilton Greathouse, MD  REFERRING PROVIDER: Rodolph Bong,  MD  REFERRING DIAG: M25.511,G89.29,M25.512 (ICD-10-CM) - Chronic pain of both shoulders M54.2,G89.29 (ICD-10-CM) - Neck pain, chronic  THERAPY DIAG:  Abnormal posture  Cramp and spasm  Cervicalgia  Muscle weakness (generalized)  Bilateral shoulder pain, unspecified chronicity  Rationale for Evaluation and Treatment: Rehabilitation  ONSET DATE: Has had pain for years, but it has gotten worse in past 2-3 months   SUBJECTIVE:                                                                                                                                                                                                         SUBJECTIVE STATEMENT: Patient reports that her knees are still bothersome but she thinks this is due to being up on her feet and doing a lot of cooking.  My neck is stiff and a little sore due to I drove to [place] and did a lot of shopping.  The massage made me feel so good but the usual soreness came back the next day or so.  I do think that my shoulders are doing better.  I can reach into cabinets and I'm not having pain when I roll over onto my side.    Hand dominance: Right  PERTINENT HISTORY:  Bilateral TKA (with 2 revisions on right knee), HTN, Raynaud's disease, CAD, hard of hearing (wears hearing aide), asthma, OA  PAIN:   Are you having pain? Yes: NPRS scale: 4-5/10 Pain location: cervical and bilat shoulder Pain description: "just there" Aggravating factors: sleeping, overhead reaching Relieving factors: sometimes medication  PRECAUTIONS: None  RED FLAGS: None     WEIGHT BEARING RESTRICTIONS: No  FALLS:  Has patient fallen in last 6 months?  Has not had a fall, but has tripped recently  LIVING ENVIRONMENT: Lives with: lives with their spouse Lives in: House/apartment Stairs:  one level home with upstairs bonus room above the garage Has following equipment at home: Single point cane, Environmental consultant - 2 wheeled, and Grab bars  OCCUPATION:  Retired  PLOF: Independent  and Leisure: outdoors, water aerobics, exercise, spending time with family/friends  PATIENT GOALS: To be stronger to be able to lift items around home, like the mattress to change the sheets easier.  NEXT MD VISIT: Dr Denyse Amass on December 9th  OBJECTIVE:  Note: Objective measures were completed at Evaluation unless otherwise noted.  DIAGNOSTIC FINDINGS:  Cervical Radiograph on 08/13/2023: IMPRESSION: No acute osseous abnormality. Mild midcervical spine predominant degenerative changes. Assessment of the odontoid view is markedly limited due to technique.  Left shoulder radiograph on 08/13/2023: FINDINGS: There is no evidence of fracture or dislocation. Mild degenerative changes of the [facility] joint. There is no other focal bone abnormality. Soft tissues are unremarkable.  Right shoulder radiograph on 08/13/2023: FINDINGS: Degenerative spurring at the acromioclavicular joint which is narrow. Mild spurring at the [facility] joint. No acute fracture, bone lesion, or subluxation.   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  At times, has numbness and tingling in hands, as well.  POSTURE: rounded shoulders, forward head, and increased thoracic kyphosis  PALPATION: Pt with tight musculature noted in bilateral cervical region   CERVICAL ROM:   Active ROM A/PROM (deg) eval  Flexion 55  Extension 40  Right lateral flexion 20  Left lateral flexion 20  Right rotation 50  Left rotation 45   (Blank rows = not tested)  UPPER EXTREMITY ROM:  Active ROM Right eval Left eval  Shoulder flexion 135 130  Shoulder extension 70 65  Shoulder abduction 137 130   (Blank rows = not tested)  UPPER EXTREMITY MMT:  Eval:   Bilateral shoulder strength grossly 4 to 4+/5 throughout  FUNCTIONAL TESTS:   Eval:   5 times sit to stand: 11.24 sec  TODAY'S TREATMENT:                                                                                                                              DATE: 10/08/2023 UBE x 6 min (PT present to discuss 3 way scapular stabilization with yellow loop x 5 each side 4D ball rolls x 20 each direction on each UE Reviewed shoulder exercises: (prone series) Traction (mechanical) x 15 min : 20 lbs max, 10 lbs min, hold time 60 sec, rest 10 sec. 2 steps  DATE: 10/04/2023 UBE x 6 min (PT present to discuss 3 way scapular stabilization with yellow loop x 5 each side 4D ball rolls x 20 each direction on each UE Reviewed shoulder exercises: (prone series) Trigger Point Dry-Needling  Treatment instructions: Expect mild to moderate muscle soreness. S/S of pneumothorax if dry needled over a lung field, and to seek immediate medical attention should they occur. Patient verbalized understanding of these instructions and education. Patient Consent Given: Yes Education handout provided: Yes Muscles treated: bilateral upper traps and levators, bilateral cervical multifidi, bilateral  cervical sub occipital areas Electrical stimulation performed: No Parameters: N/A Treatment response/outcome: Skilled palpation used to identify taut bands and trigger points.  Once identified, dry needling techniques used to treat these areas.  Twitch response ellicited along with palpable elongation of muscle.  Following treatment,  patient was able to rotate left and right with increased ease and reported decreased pain in right sub occipital area.    DATE: 10/02/2023 Nustep level 3 x6 min with PT present to discuss status Supine MELT method with head on soft foam roll:  x20 horizontal rotations x20 head nods FOTO Seated levator stretch 2x20 sec bilat Supine shoulder flexion with 3# on cane 2x10 Supine shoulder chest press with 3# on cane 2x10 Supine shoulder/arm isometric press into mat  2x10 Manual Therapy:  soft tissue mobilization to bilateral cervical and scapular/shoulder region with manual trigger point release to areas where trigger point palpated.  Manual suboccipital release bilaterally.  To promote overall decreased pain and improved tissue elongation.   PATIENT EDUCATION:  Education details: Issued HEP Person educated: Patient Education method: Explanation, Facilities manager, and Handouts Education comprehension: verbalized understanding  HOME EXERCISE PROGRAM: Access Code: [email or web address] URL: [email or web address] Date: 08/23/2023 Prepared by: Mikey Kirschner  Exercises - Seated Scapular Retraction  - 1 x daily - 7 x weekly - 2 sets - 10 reps - Seated Cervical Rotation AROM  - 1 x daily - 7 x weekly - 2 sets - 10 reps - Seated Cervical Sidebending AROM  - 1 x daily - 7 x weekly - 2 sets - 10 reps - Seated Cervical Retraction  - 1 x daily - 7 x weekly - 2 sets - 10 reps - Prone Shoulder Extension - Single Arm  - 2 x daily - 7 x weekly - 2 sets - 10 reps - Prone Shoulder Row  - 2 x daily - 7 x weekly - 2 sets - 10 reps - Prone Single Arm Shoulder Horizontal Abduction with Scapular Retraction and Palm Down  - 2 x daily - 7 x weekly - 2 sets - 10 reps - Sidelying Shoulder External Rotation  - 2 x daily - 7 x weekly - 2 sets - 10 reps - Single Arm Serratus Punches in Supine with Dumbbell  - 2 x daily - 7 x weekly - 2 sets - 10 reps  ASSESSMENT:  CLINICAL IMPRESSION: Ms Cannaday is approaching the end of her cert period.  She seems to be gaining function in the shoulders.  Her neck pain has improved.  She is forgetful about instructions of what to avoid in her exercise classes.  She also needs reminders about correct posture.  She would likely benefit from continued skilled PT for shoulder and postural strengthening along with pain control and manual techniques.  She has one visit left before end of her cert period.  We will re-assess next visit.     OBJECTIVE IMPAIRMENTS: decreased ROM, decreased strength, increased muscle spasms, impaired flexibility, impaired UE functional use, improper body mechanics, postural dysfunction, and pain.   ACTIVITY LIMITATIONS: carrying, lifting, sleeping, and reach over head  PARTICIPATION LIMITATIONS: cleaning, laundry, and community activity  PERSONAL FACTORS: Past/current experiences, Time since onset of injury/illness/exacerbation, and 3+ comorbidities: GBS, Raynaud's, OA, asthma   are also affecting patient's functional outcome.   REHAB POTENTIAL: Good  CLINICAL DECISION MAKING: Evolving/moderate complexity  EVALUATION COMPLEXITY: Moderate   GOALS: Goals reviewed with patient? Yes  SHORT TERM GOALS: Target date: 09/07/2023  Patient will be independent with initial HEP. Baseline:  Goal status: MET 09/10/23  2.  Patient will improve cervical and shoulder A/ROM to [facility] to allow her to perform various household tasks. Baseline:  Goal status:  MET 09/10/23   LONG TERM GOALS: Target date: 10/12/2023  Patient will be independent with advanced HEP to allow her self-progression after discharge. Baseline:  Goal status: Ongoing  2.  Patient will increase her functional UE strength to [facility] to allow her to lift items and place in overhead shelves and to lift suitcases. Baseline:  Goal status: MET 09/27/23  3.  Patient will be able to participate in previous exercise classes without increased pain. Baseline:  Goal status: In progress  4.  Patient will increase FOTO to 57 to demonstrate improvements in functional tasks. Baseline: 41 Goal status: Ongoing (see above)    PLAN:  PT FREQUENCY: 2x/week  PT DURATION: 8 weeks  PLANNED INTERVENTIONS: 97164- PT Re-evaluation, 97110-Therapeutic exercises, 97530- Therapeutic activity, [id number]- Neuromuscular re-education, 97535- Self Care, 16109- Manual therapy, [id number]- Canalith repositioning, [id number]- Aquatic Therapy, 97014- Electrical stimulation  (unattended), [id number]- Electrical stimulation (manual), [id number]- Vasopneumatic device, [id number]- Ultrasound, [id number]- Traction (mechanical), [id number]- Ionotophoresis 4mg /ml Dexamethasone, Patient/Family education, Balance training, Taping, Dry Needling, Joint mobilization, Joint manipulation, Spinal manipulation, Spinal mobilization, Vestibular training, Cryotherapy, and Moist heat  PLAN FOR NEXT SESSION: Re-assess next visit for recert or DC.  We discussed that she is at somewhat of a plateau but could possibly benefit from continued rehab for shoulder strengthening.  She will see Dr. Denyse Amass on 10/21/22.  Possibly hold or only continue until f/u with Dr. Denyse Amass.  Patient may need MRI on left shoulder.  Continue shoulder strengthening and scapular stabilization for the right shoulder.  Thoracic rotation exercises, shoulder depression exercises, assess and progress HEP as indicated, strengthening, ROM/flexibility, postural stabilization.   Victorino Dike B. Maverik Foot, PT 10/08/23 4:13 PM [facility] [address] [place], [place] [address] Phone # [phone] Fax [phone]

## 2023-10-12 ENCOUNTER — Ambulatory Visit: Payer: Medicare HMO

## 2023-10-12 DIAGNOSIS — R262 Difficulty in walking, not elsewhere classified: Secondary | ICD-10-CM | POA: Diagnosis not present

## 2023-10-12 DIAGNOSIS — M25512 Pain in left shoulder: Secondary | ICD-10-CM | POA: Diagnosis not present

## 2023-10-12 DIAGNOSIS — M25511 Pain in right shoulder: Secondary | ICD-10-CM | POA: Diagnosis not present

## 2023-10-12 DIAGNOSIS — M542 Cervicalgia: Secondary | ICD-10-CM | POA: Diagnosis not present

## 2023-10-12 DIAGNOSIS — R252 Cramp and spasm: Secondary | ICD-10-CM | POA: Diagnosis not present

## 2023-10-12 DIAGNOSIS — M6281 Muscle weakness (generalized): Secondary | ICD-10-CM

## 2023-10-12 DIAGNOSIS — R293 Abnormal posture: Secondary | ICD-10-CM

## 2023-10-12 NOTE — Therapy (Signed)
OUTPATIENT PHYSICAL THERAPY RECERT NOTE     Patient Name: Brianna Gates MRN: [id number] DOB:07/15/1951, 72 y.o., female Today's Date: 10/12/2023  END OF SESSION:  PT End of Session - 10/12/23 1024     Visit Number 15    Date for PT Re-Evaluation 10/12/23    Authorization Type [employer]    Progress Note Due on Visit 20    PT Start Time 1024    PT Stop Time 1058    PT Time Calculation (min) 34 min    Activity Tolerance Patient tolerated treatment well    Behavior During Therapy WFL for tasks assessed/performed              Past Medical History:  Diagnosis Date   Allergy    Anxiety    Asthma    INFREQUENT PROBLEM - rarely used inhaler   CAD (coronary artery disease) 06/29/2023   CCTA 06/27/23: CAC score 15.7 (45th percentile), mild nonobstructive CAD [mid LAD 1-24, proximal LCx 1-24]    Cervical spondylosis    Cervical strain 07/22/2014   Chest pain of uncertain etiology 12/13/2021   Complication of anesthesia    UNKNOWN REACTION WITH RASH AT SITE OF IV only with knee surgery   Degenerative arthritis    Dyslipidemia    Eczema    GERD (gastroesophageal reflux disease)    Guillain-Barre syndrome (HCC)    History of Guillain-Barre syndrome 07/22/2014   Hyperlipidemia    Hypertension    Hypothyroidism    Lumbosacral spondylosis    Palpitations 12/12/2021   Pure hypercholesterolemia 12/13/2021   Raynaud disease    Thyroid nodule    Past Surgical History:  Procedure Laterality Date   ABDOMINAL HYSTERECTOMY     COLONOSCOPY     ESOPHAGOGASTRODUODENOSCOPY (EGD) WITH PROPOFOL N/A 09/13/2021   Procedure: ESOPHAGOGASTRODUODENOSCOPY (EGD) WITH PROPOFOL;  Surgeon: Hilarie Fredrickson, MD;  Location: [facility];  Service: Endoscopy;  Laterality: N/A;   FOOT SURGERY Left    GANGLION CYST EXCISION Left    Wrist   HERNIA REPAIR     KNEE ARTHROSCOPY     RT KNEE   KNEE ARTHROTOMY Right 08/27/2013   Procedure: RIGHT KNEE SCAR EXCISION AND FEMORAL REVISION;   Surgeon: Loanne Drilling, MD;  Location: [facility];  Service: Orthopedics;  Laterality: Right;   REPLACEMENT TOTAL KNEE Right    2014, 2021   REPLACEMENT TOTAL KNEE BILATERAL Bilateral    STRABISMUS SURGERY Right    TONSILLECTOMY     vaginal vulvo prolapse  09/16/2015   Patient Active Problem List   Diagnosis Date Noted   CAD (coronary artery disease) 06/29/2023   Paresthesia 10/18/2022   Onychomycosis 01/29/2022   Chest pain of uncertain etiology 12/13/2021   Pure hypercholesterolemia 12/13/2021   Palpitations 12/12/2021   Esophageal dysphagia    Allergic rhinitis 04/04/2021   Allergic rhinitis due to animal (cat) (dog) hair and dander 04/04/2021   Allergic rhinitis due to pollen 04/04/2021   Hormone deficiency 04/04/2021   Syncope and collapse 12/27/2020   Hypotension 12/27/2020   Bradycardia 12/27/2020   History of COVID-19 12/27/2020   Preoperative evaluation to rule out surgical contraindication 07/16/2020   Arthrofibrosis of knee joint, right 11/26/2019   Arthritis of knee 07/30/2019   Presence of right artificial knee joint 07/30/2019   Sleep apnea 10/16/2018   Shoulder impingement syndrome 01/15/2018   Gastroesophageal reflux disease 02/02/2016   Sensorineural hearing loss (SNHL), bilateral 02/02/2016   Vaginal vault prolapse after hysterectomy 07/28/2015  History of Guillain-Barre syndrome 07/22/2014   Cervical strain 07/22/2014   Postoperative stiffness of total knee replacement (HCC) 08/27/2013   Abnormality of gait 08/22/2012   Cervical spondylosis without myelopathy 08/22/2012   Muscle weakness (generalized) 08/22/2012   Guillain-Barre syndrome (HCC) 08/22/2012   Essential hypertension 08/22/2012   Strabismus 08/22/2012   Degenerative joint disease of knee, left 08/22/2012   Hypothyroidism 08/22/2012   Raynaud's disease 08/22/2012   Knee joint replacement by other means 08/22/2012    PCP: Chilton Greathouse, MD  REFERRING PROVIDER: Rodolph Bong,  MD  REFERRING DIAG: M25.511,G89.29,M25.512 (ICD-10-CM) - Chronic pain of both shoulders M54.2,G89.29 (ICD-10-CM) - Neck pain, chronic  THERAPY DIAG:  Abnormal posture  Cramp and spasm  Difficulty in walking, not elsewhere classified  Muscle weakness (generalized)  Cervicalgia  Bilateral shoulder pain, unspecified chronicity  Rationale for Evaluation and Treatment: Rehabilitation  ONSET DATE: Has had pain for years, but it has gotten worse in past 2-3 months   SUBJECTIVE:                                                                                                                                                                                                         SUBJECTIVE STATEMENT: Patient reports she is not sure about continuing.  She feels like she is able to reach higher and reach her top shelves.  She has been putting Christmas decor away and doing ok but she feels like her shoulders aren't quite where she needs to be.  The pulleys have gotten easier.    Hand dominance: Right  PERTINENT HISTORY:  Bilateral TKA (with 2 revisions on right knee), HTN, Raynaud's disease, CAD, hard of hearing (wears hearing aide), asthma, OA  PAIN:   Are you having pain? Yes: NPRS scale: 4-5/10 Pain location: cervical and bilat shoulder Pain description: "just there" Aggravating factors: sleeping, overhead reaching Relieving factors: sometimes medication  PRECAUTIONS: None  RED FLAGS: None     WEIGHT BEARING RESTRICTIONS: No  FALLS:  Has patient fallen in last 6 months?  Has not had a fall, but has tripped recently  LIVING ENVIRONMENT: Lives with: lives with their spouse Lives in: House/apartment Stairs:  one level home with upstairs bonus room above the garage Has following equipment at home: Single point cane, Environmental consultant - 2 wheeled, and Grab bars  OCCUPATION: Retired  [employer]: Independent and Leisure: outdoors, water aerobics, exercise, spending time with  family/friends  PATIENT GOALS: To be stronger to be able to lift items around home, like the mattress to change the sheets easier.  NEXT  MD VISIT: Dr Denyse Amass on December 9th  OBJECTIVE:  Note: Objective measures were completed at Evaluation unless otherwise noted.  DIAGNOSTIC FINDINGS:  Cervical Radiograph on 08/13/2023: IMPRESSION: No acute osseous abnormality. Mild midcervical spine predominant degenerative changes. Assessment of the odontoid view is markedly limited due to technique.  Left shoulder radiograph on 08/13/2023: FINDINGS: There is no evidence of fracture or dislocation. Mild degenerative changes of the [facility] joint. There is no other focal bone abnormality. Soft tissues are unremarkable.  Right shoulder radiograph on 08/13/2023: FINDINGS: Degenerative spurring at the acromioclavicular joint which is narrow. Mild spurring at the [facility] joint. No acute fracture, bone lesion, or subluxation.   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  At times, has numbness and tingling in hands, as well.  POSTURE: rounded shoulders, forward head, and increased thoracic kyphosis  PALPATION: Pt with tight musculature noted in bilateral cervical region   CERVICAL ROM:   Active ROM A/PROM (deg) eval A/PROM (deg) 10/12/23  Flexion 55 52  Extension 40 32  Right lateral flexion 20 22  Left lateral flexion 20 26  Right rotation 50 50  Left rotation 45 52   (Blank rows = not tested)  UPPER EXTREMITY ROM:  Active ROM Right eval Right 10/12/23 Left eval Left 10/12/23  Shoulder flexion 135 170 130 172  Shoulder extension 70  65   Shoulder abduction 137 WNL 130 150  Shoulder ER  WNL  WNL   (Blank rows = not tested)  UPPER EXTREMITY MMT:  Eval:   Bilateral shoulder strength grossly  4 to 4+/5 throughout  10/12/23: Bilateral shoulders still grossly 4+ to 5/5  FUNCTIONAL TESTS:  Eval:   5 times sit to stand: 11.24 sec  TODAY'S TREATMENT:                                                                                                                              DATE: 10/12/2023 (patient late for appt) Recert assessment completed Reviewed current HEP and how to progress, precautions for overhead reaching and activities in her exercise class  DATE: 10/08/2023 UBE x 6 min (PT present to discuss 3 way scapular stabilization with yellow loop x 5 each side 4D ball rolls x 20 each direction on each UE Reviewed shoulder exercises: (prone series) Traction (mechanical) x 15 min : 20 lbs max, 10 lbs min, hold time 60 sec, rest 10 sec. 2 steps  DATE: 10/04/2023 UBE x 6 min (PT present to discuss 3 way scapular stabilization with yellow loop x 5 each side 4D ball rolls x 20 each direction on each UE Reviewed shoulder exercises: (prone series) Trigger Point Dry-Needling  Treatment instructions: Expect mild to moderate muscle soreness. S/S of pneumothorax if dry needled over a lung field, and to seek  immediate medical attention should they occur. Patient verbalized understanding of these instructions and education. Patient Consent Given: Yes Education handout provided: Yes Muscles treated: bilateral upper traps and levators, bilateral cervical multifidi, bilateral cervical sub occipital areas Electrical stimulation performed: No Parameters: N/A Treatment response/outcome: Skilled palpation used to identify taut bands and trigger points.  Once identified, dry needling techniques used to treat these areas.  Twitch response ellicited along with palpable elongation of muscle.  Following treatment,  patient was able to rotate left and right with increased ease and reported decreased pain in right sub occipital area.    DATE: 10/02/2023 Nustep level 3 x6 min with PT present to  discuss status Supine MELT method with head on soft foam roll:  x20 horizontal rotations x20 head nods FOTO Seated levator stretch 2x20 sec bilat Supine shoulder flexion with 3# on cane 2x10 Supine shoulder chest press with 3# on cane 2x10 Supine shoulder/arm isometric press into mat 2x10 Manual Therapy:  soft tissue mobilization to bilateral cervical and scapular/shoulder region with manual trigger point release to areas where trigger point palpated.  Manual suboccipital release bilaterally.  To promote overall decreased pain and improved tissue elongation.   PATIENT EDUCATION:  Education details: Issued HEP Person educated: Patient Education method: Explanation, Facilities manager, and Handouts Education comprehension: verbalized understanding  HOME EXERCISE PROGRAM: Access Code: [email or web address] URL: [email or web address] Date: 08/23/2023 Prepared by: Mikey Kirschner  Exercises - Seated Scapular Retraction  - 1 x daily - 7 x weekly - 2 sets - 10 reps - Seated Cervical Rotation AROM  - 1 x daily - 7 x weekly - 2 sets - 10 reps - Seated Cervical Sidebending AROM  - 1 x daily - 7 x weekly - 2 sets - 10 reps - Seated Cervical Retraction  - 1 x daily - 7 x weekly - 2 sets - 10 reps - Prone Shoulder Extension - Single Arm  - 2 x daily - 7 x weekly - 2 sets - 10 reps - Prone Shoulder Row  - 2 x daily - 7 x weekly - 2 sets - 10 reps - Prone Single Arm Shoulder Horizontal Abduction with Scapular Retraction and Palm Down  - 2 x daily - 7 x weekly - 2 sets - 10 reps - Sidelying Shoulder External Rotation  - 2 x daily - 7 x weekly - 2 sets - 10 reps - Single Arm Serratus Punches in Supine with Dumbbell  - 2 x daily - 7 x weekly - 2 sets - 10 reps  ASSESSMENT:  CLINICAL IMPRESSION: Ms Askelson is gaining function in the shoulders.  Her neck pain has improved.  She is forgetful about instructions of what to avoid in her exercise classes.  She also needs reminders about correct posture.  She  would likely benefit from continued skilled PT for shoulder and postural strengthening along with pain control and manual techniques.    OBJECTIVE IMPAIRMENTS: decreased ROM, decreased strength, increased muscle spasms, impaired flexibility, impaired UE functional use, improper body mechanics, postural dysfunction, and pain.   ACTIVITY LIMITATIONS: carrying, lifting, sleeping, and reach over head  PARTICIPATION LIMITATIONS: cleaning, laundry, and community activity  PERSONAL FACTORS: Past/current experiences, Time since onset of injury/illness/exacerbation, and 3+ comorbidities: GBS, Raynaud's, OA, asthma   are also affecting patient's functional outcome.   REHAB POTENTIAL: Good  CLINICAL DECISION MAKING: Evolving/moderate complexity  EVALUATION COMPLEXITY: Moderate   GOALS: Goals reviewed with patient? Yes  SHORT TERM GOALS: Target date: 09/07/2023  Patient will be independent with  initial HEP. Baseline:  Goal status: MET 09/10/23  2.  Patient will improve cervical and shoulder A/ROM to [facility] to allow her to perform various household tasks. Baseline:  Goal status:  MET 09/10/23   LONG TERM GOALS: Target date: 10/12/2023  Patient will be independent with advanced HEP to allow her self-progression after discharge. Baseline:  Goal status: Ongoing  2.  Patient will increase her functional UE strength to [facility] to allow her to lift items and place in overhead shelves and to lift suitcases. Baseline:  Goal status: MET 09/27/23  3.  Patient will be able to participate in previous exercise classes without increased pain. Baseline:  Goal status: In progress  4.  Patient will increase FOTO to 57 to demonstrate improvements in functional tasks. Baseline: 41 Goal status: Ongoing (see above)    PLAN:  PT FREQUENCY: 2x/week  PT DURATION: 8 weeks  PLANNED INTERVENTIONS: 97164- PT Re-evaluation, 97110-Therapeutic exercises, 97530- Therapeutic activity, 97112- Neuromuscular  re-education, 97535- Self Care, 40981- Manual therapy, [id number]- Canalith repositioning, [id number]- Aquatic Therapy, 97014- Electrical stimulation (unattended), [id number]- Electrical stimulation (manual), [id number]- Vasopneumatic device, [id number]- Ultrasound, [id number]- Traction (mechanical), [id number]- Ionotophoresis 4mg /ml Dexamethasone, Patient/Family education, Balance training, Taping, Dry Needling, Joint mobilization, Joint manipulation, Spinal manipulation, Spinal mobilization, Vestibular training, Cryotherapy, and Moist heat  PLAN FOR NEXT SESSION: Continue PT 1 time per week x 8 weeks.  She will see Dr. Denyse Amass on 10/21/22.   Continue shoulder strengthening and scapular stabilization for the right shoulder.  Thoracic rotation exercises, shoulder depression exercises, assess and progress HEP as indicated, strengthening, ROM/flexibility, postural stabilization.   Victorino Dike B. Ciin Brazzel, PT 10/12/23 12:03 PM [facility] [address] [place], [place] [address] Phone # [phone] Fax [phone]

## 2023-10-15 ENCOUNTER — Telehealth: Payer: Self-pay

## 2023-10-15 ENCOUNTER — Other Ambulatory Visit (HOSPITAL_COMMUNITY): Payer: Self-pay

## 2023-10-15 NOTE — Telephone Encounter (Signed)
Pharmacy Patient Advocate Encounter  Received notification from [facility] that Prior Authorization for LOSARTAN has been APPROVED from 01/15/23 to 10/16/23. Ran test claim, Copay is $5.57 FOR A 90 DS. This test claim was processed through [facility]- copay amounts may vary at other pharmacies due to pharmacy/plan contracts, or as the patient moves through the different stages of their insurance plan.

## 2023-10-15 NOTE — Telephone Encounter (Signed)
Pharmacy Patient Advocate Encounter   Received notification from CoverMyMeds that prior authorization for LOSARTAN is required/requested.   Insurance verification completed.   The patient is insured through [facility] .   Per test claim: PA required; PA submitted to above mentioned insurance via CoverMyMeds Key/confirmation #/EOC [facility] Status is pending

## 2023-10-18 DIAGNOSIS — J301 Allergic rhinitis due to pollen: Secondary | ICD-10-CM | POA: Diagnosis not present

## 2023-10-18 DIAGNOSIS — J3089 Other allergic rhinitis: Secondary | ICD-10-CM | POA: Diagnosis not present

## 2023-10-18 DIAGNOSIS — J3081 Allergic rhinitis due to animal (cat) (dog) hair and dander: Secondary | ICD-10-CM | POA: Diagnosis not present

## 2023-10-18 DIAGNOSIS — H903 Sensorineural hearing loss, bilateral: Secondary | ICD-10-CM | POA: Diagnosis not present

## 2023-10-19 ENCOUNTER — Ambulatory Visit (HOSPITAL_BASED_OUTPATIENT_CLINIC_OR_DEPARTMENT_OTHER): Payer: Medicare HMO | Admitting: Family

## 2023-10-22 ENCOUNTER — Ambulatory Visit: Payer: Medicare HMO | Admitting: Family Medicine

## 2023-10-22 ENCOUNTER — Encounter: Payer: Self-pay | Admitting: Family Medicine

## 2023-10-22 VITALS — BP 130/82 | HR 58 | Ht 64.0 in | Wt 116.0 lb

## 2023-10-22 DIAGNOSIS — G8929 Other chronic pain: Secondary | ICD-10-CM | POA: Diagnosis not present

## 2023-10-22 DIAGNOSIS — M25512 Pain in left shoulder: Secondary | ICD-10-CM

## 2023-10-22 NOTE — Progress Notes (Signed)
   I, Leotis Batter, CMA acting as a scribe for Artist Lloyd, MD.  Brianna Gates is a 73 y.o. female who presents to [facility] today for f/u bilat shoulder and neck pain. Pt was last seen by Dr. Lloyd on 09/24/23 and was given a R subacromial steroid injection and was advised to cont PT, completing 15 visits.  Today, pt reports some improvement of right shoulder sx s/p injection. Sx started to return after a few days. Feels that overhead ROM has improved. Continues to have pain in both shoulders with certain movements and neck pain. Waking less often now. Mechanical sx present in left shoulder. Also has trouble lifting weight with the left arm. PT and HEP has been helpful.   Dx imaging: 08/13/23 R & L shoulder and C-spine XR  Pertinent review of systems: No fevers or chills  Relevant historical information: Hypertension.  History of Guillain-Barr syndrome.   Exam:  BP 130/82   Pulse (!) 58   Ht 5' 4 (1.626 m)   Wt 116 lb (52.6 kg)   SpO2 99%   BMI 19.91 kg/m  General: Well Developed, well nourished, and in no acute distress.   MSK: Left shoulder normal-appearing Limited range of motion abduction limited to 130 degrees.  Normal external rotation.  Functional internal rotation limited to lumbar spine. Popping and crepitation palpated with range of motion. Intact strength.    Lab and Radiology Results  EXAM: LEFT SHOULDER - 3 VIEW   COMPARISON:  None Available.   FINDINGS: There is no evidence of fracture or dislocation. Mild degenerative changes of the [facility] joint. There is no other focal bone abnormality. Soft tissues are unremarkable.   IMPRESSION: No acute osseous abnormality.     Electronically Signed   By: Lyndall Gore M.D.   On: 08/29/2023 09:59   I, Artist Lloyd, personally (independently) visualized and performed the interpretation of the images attached in this note.      Assessment and Plan: 73 y.o. female with  chronic bilateral shoulder pain with currently worse than right.  She had a right subacromial injection about a month ago which helped some.  She continues to experience pain.  Today she has more bothersome left-sided pain and popping and clicking with motion.  She has had a great trial of physical therapy over the last 2 months with continued pain and dysfunction especially in the left side.  Plan for MRI left shoulder to evaluate source of pain and for injection or surgical planning.  Recheck after MRI.   PDMP not reviewed this encounter. Orders Placed This Encounter  Procedures   MR SHOULDER LEFT WO CONTRAST    Standing Status:   Future    Expiration Date:   10/21/2024    What is the patient's sedation requirement?:   No Sedation    Does the patient have a pacemaker or implanted devices?:   No    Preferred imaging location?:   [facility] (table limit-550lbs)   No orders of the defined types were placed in this encounter.    Discussed warning signs or symptoms. Please see discharge instructions. Patient expresses understanding.   The above documentation has been reviewed and is accurate and complete Artist Lloyd, M.D.

## 2023-10-22 NOTE — Patient Instructions (Addendum)
 Thank you for coming in today.   You should hear from MRI scheduling within 1 week. If you do not hear please let me know.    Check back after we get the results

## 2023-10-25 ENCOUNTER — Ambulatory Visit: Payer: Medicare HMO | Attending: Family Medicine

## 2023-10-25 DIAGNOSIS — M25512 Pain in left shoulder: Secondary | ICD-10-CM | POA: Insufficient documentation

## 2023-10-25 DIAGNOSIS — R262 Difficulty in walking, not elsewhere classified: Secondary | ICD-10-CM | POA: Diagnosis not present

## 2023-10-25 DIAGNOSIS — M542 Cervicalgia: Secondary | ICD-10-CM | POA: Insufficient documentation

## 2023-10-25 DIAGNOSIS — M6281 Muscle weakness (generalized): Secondary | ICD-10-CM | POA: Insufficient documentation

## 2023-10-25 DIAGNOSIS — R293 Abnormal posture: Secondary | ICD-10-CM | POA: Insufficient documentation

## 2023-10-25 DIAGNOSIS — R252 Cramp and spasm: Secondary | ICD-10-CM | POA: Diagnosis not present

## 2023-10-25 DIAGNOSIS — M25511 Pain in right shoulder: Secondary | ICD-10-CM | POA: Insufficient documentation

## 2023-10-25 NOTE — Therapy (Signed)
 OUTPATIENT PHYSICAL THERAPY TREATMENT NOTE     Patient Name: Brianna Gates MRN: [id number] DOB:[birth year], 73 y.o., female Today's Date: 10/25/2023  END OF SESSION:  PT End of Session - 10/25/23 1541     Visit Number 16    Date for PT Re-Evaluation 12/07/23    Authorization Type [employer]    Progress Note Due on Visit 20    PT Start Time 1533    PT Stop Time 1617    PT Time Calculation (min) 44 min    Activity Tolerance Patient tolerated treatment well    Behavior During Therapy [facility] for tasks assessed/performed              Past Medical History:  Diagnosis Date   Allergy    Anxiety    Asthma    INFREQUENT PROBLEM - rarely used inhaler   CAD (coronary artery disease) 06/29/2023   CCTA 06/27/23: CAC score 15.7 (45th percentile), mild nonobstructive CAD [mid LAD 1-24, proximal LCx 1-24]    Cervical spondylosis    Cervical strain 07/22/2014   Chest pain of uncertain etiology 12/13/2021   Complication of anesthesia    UNKNOWN REACTION WITH RASH AT SITE OF IV only with knee surgery   Degenerative arthritis    Dyslipidemia    Eczema    GERD (gastroesophageal reflux disease)    Guillain-Barre syndrome (HCC)    History of Guillain-Barre syndrome 07/22/2014   Hyperlipidemia    Hypertension    Hypothyroidism    Lumbosacral spondylosis    Palpitations 12/12/2021   Pure hypercholesterolemia 12/13/2021   Raynaud disease    Thyroid  nodule    Past Surgical History:  Procedure Laterality Date   ABDOMINAL HYSTERECTOMY     COLONOSCOPY     ESOPHAGOGASTRODUODENOSCOPY (EGD) WITH PROPOFOL  N/A 09/13/2021   Procedure: ESOPHAGOGASTRODUODENOSCOPY (EGD) WITH PROPOFOL ;  Surgeon: Abran Norleen SAILOR, MD;  Location: [facility];  Service: Endoscopy;  Laterality: N/A;   FOOT SURGERY Left    GANGLION CYST EXCISION Left    Wrist   HERNIA REPAIR     KNEE ARTHROSCOPY     RT KNEE   KNEE ARTHROTOMY Right 08/27/2013   Procedure: RIGHT KNEE SCAR EXCISION AND FEMORAL REVISION;   Surgeon: Dempsey LULLA Moan, MD;  Location: [facility];  Service: Orthopedics;  Laterality: Right;   REPLACEMENT TOTAL KNEE Right    2014, 2021   REPLACEMENT TOTAL KNEE BILATERAL Bilateral    STRABISMUS SURGERY Right    TONSILLECTOMY     vaginal vulvo prolapse  09/16/2015   Patient Active Problem List   Diagnosis Date Noted   CAD (coronary artery disease) 06/29/2023   Paresthesia 10/18/2022   Onychomycosis 01/29/2022   Chest pain of uncertain etiology 12/13/2021   Pure hypercholesterolemia 12/13/2021   Palpitations 12/12/2021   Esophageal dysphagia    Allergic rhinitis 04/04/2021   Allergic rhinitis due to animal (cat) (dog) hair and dander 04/04/2021   Allergic rhinitis due to pollen 04/04/2021   Hormone deficiency 04/04/2021   Syncope and collapse 12/27/2020   Hypotension 12/27/2020   Bradycardia 12/27/2020   History of COVID-19 12/27/2020   Preoperative evaluation to rule out surgical contraindication 07/16/2020   Arthrofibrosis of knee joint, right 11/26/2019   Arthritis of knee 07/30/2019   Presence of right artificial knee joint 07/30/2019   Sleep apnea 10/16/2018   Shoulder impingement syndrome 01/15/2018   Gastroesophageal reflux disease 02/02/2016   Sensorineural hearing loss (SNHL), bilateral 02/02/2016   Vaginal vault prolapse after hysterectomy 07/28/2015  History of Guillain-Barre syndrome 07/22/2014   Cervical strain 07/22/2014   Postoperative stiffness of total knee replacement (HCC) 08/27/2013   Abnormality of gait 08/22/2012   Cervical spondylosis without myelopathy 08/22/2012   Muscle weakness (generalized) 08/22/2012   Guillain-Barre syndrome (HCC) 08/22/2012   Essential hypertension 08/22/2012   Strabismus 08/22/2012   Degenerative joint disease of knee, left 08/22/2012   Hypothyroidism 08/22/2012   Raynaud's disease 08/22/2012   Knee joint replacement by other means 08/22/2012    PCP: Janey Santos, MD  REFERRING PROVIDER: Joane Artist RAMAN,  MD  REFERRING DIAG: M25.511,G89.29,M25.512 (ICD-10-CM) - Chronic pain of both shoulders M54.2,G89.29 (ICD-10-CM) - Neck pain, chronic  THERAPY DIAG:  Abnormal posture  Cramp and spasm  Difficulty in walking, not elsewhere classified  Muscle weakness (generalized)  Cervicalgia  Bilateral shoulder pain, unspecified chronicity  Rationale for Evaluation and Treatment: Rehabilitation  ONSET DATE: Has had pain for years, but it has gotten worse in past 2-3 months   SUBJECTIVE:                                                                                                                                                                                                         SUBJECTIVE STATEMENT: Patient reports she is achy today.  With the weather and it being cold, I'm just stiff and sore.   Hand dominance: Right  PERTINENT HISTORY:  Bilateral TKA (with 2 revisions on right knee), HTN, Raynaud's disease, CAD, hard of hearing (wears hearing aide), asthma, OA  PAIN:  10/25/23 Are you having pain? Yes: NPRS scale: 4/10 Pain location: cervical and bilat shoulder Pain description: just there Aggravating factors: sleeping, overhead reaching Relieving factors: sometimes medication  PRECAUTIONS: None  RED FLAGS: None     WEIGHT BEARING RESTRICTIONS: No  FALLS:  Has patient fallen in last 6 months?  Has not had a fall, but has tripped recently  LIVING ENVIRONMENT: Lives with: lives with their spouse Lives in: House/apartment Stairs:  one level home with upstairs bonus room above the garage Has following equipment at home: Single point cane, Environmental Consultant - 2 wheeled, and Grab bars  OCCUPATION: Retired  PLOF: Independent and Leisure: outdoors, water  aerobics, exercise, spending time with family/friends  PATIENT GOALS: To be stronger to be able to lift items around home, like the mattress to change the sheets easier.  NEXT MD VISIT: Dr Joane on December 9th  OBJECTIVE:  Note:  Objective measures were completed at Evaluation unless otherwise noted.  DIAGNOSTIC FINDINGS:  Cervical Radiograph on 08/13/2023: IMPRESSION: No acute osseous abnormality. Mild midcervical  spine predominant degenerative changes. Assessment of the odontoid view is markedly limited due to technique.  Left shoulder radiograph on 08/13/2023: FINDINGS: There is no evidence of fracture or dislocation. Mild degenerative changes of the [facility] joint. There is no other focal bone abnormality. Soft tissues are unremarkable.  Right shoulder radiograph on 08/13/2023: FINDINGS: Degenerative spurring at the acromioclavicular joint which is narrow. Mild spurring at the [facility] joint. No acute fracture, bone lesion, or subluxation.   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  At times, has numbness and tingling in hands, as well.  POSTURE: rounded shoulders, forward head, and increased thoracic kyphosis  PALPATION: Pt with tight musculature noted in bilateral cervical region   CERVICAL ROM:   Active ROM A/PROM (deg) eval A/PROM (deg) 10/12/23  Flexion 55 52  Extension 40 32  Right lateral flexion 20 22  Left lateral flexion 20 26  Right rotation 50 50  Left rotation 45 52   (Blank rows = not tested)  UPPER EXTREMITY ROM:  Active ROM Right eval Right 10/12/23 Left eval Left 10/12/23  Shoulder flexion 135 170 130 172  Shoulder extension 70  65   Shoulder abduction 137 WNL 130 150  Shoulder ER  WNL  WNL   (Blank rows = not tested)  UPPER EXTREMITY MMT:  Eval:   Bilateral shoulder strength grossly 4 to 4+/5 throughout  10/12/23: Bilateral shoulders still grossly 4+ to 5/5  FUNCTIONAL TESTS:  Eval:   5 times sit to stand: 11.24 sec  TODAY'S TREATMENT:                                                                                                                               DATE: 10/25/23 UBE x 6 min (PT present to discuss progress and status) 3 way scapular stabilization with yellow loop x 5 each side 4D ball rolls x 20 each direction on each UE with light blue plyo ball Wing taps 2 x 10 Prone drop and catch with light blue plyo ball  2 x 10 each  Prone shoulder extension, rows and horizontal abduction with 3 lbs (suggested 2 lbs but patient states she is doing 3 lbs the last time she did them at home) 2 x 10 each Side lying shoulder ER with 3 lbs 2 x 10 each Supine serratus punch 2 x 10 with 3 lbs  DATE: 10/12/2023 (patient late for appt) Recert assessment completed Reviewed current HEP and how to progress, precautions for overhead reaching and activities in her exercise class  DATE: 10/08/2023 UBE x 6 min (PT present to discuss 3 way scapular stabilization with yellow loop x 5 each side 4D ball rolls x 20 each direction on each UE Reviewed shoulder exercises: (prone series) Traction (mechanical) x 15 min :  20 lbs max, 10 lbs min, hold time 60 sec, rest 10 sec. 2 steps   PATIENT EDUCATION:  Education details: Issued HEP Person educated: Patient Education method: Explanation, Demonstration, and Handouts Education comprehension: verbalized understanding  HOME EXERCISE PROGRAM: Access Code: [email or web address] URL: [email or web address] Date: 08/23/2023 Prepared by: Delon Haddock  Exercises - Seated Scapular Retraction  - 1 x daily - 7 x weekly - 2 sets - 10 reps - Seated Cervical Rotation AROM  - 1 x daily - 7 x weekly - 2 sets - 10 reps - Seated Cervical Sidebending AROM  - 1 x daily - 7 x weekly - 2 sets - 10 reps - Seated Cervical Retraction  - 1 x daily - 7 x weekly - 2 sets - 10 reps - Prone Shoulder Extension - Single Arm  - 2 x daily - 7 x weekly - 2 sets - 10 reps - Prone Shoulder Row  - 2 x daily - 7 x weekly - 2 sets - 10 reps - Prone Single  Arm Shoulder Horizontal Abduction with Scapular Retraction and Palm Down  - 2 x daily - 7 x weekly - 2 sets - 10 reps - Sidelying Shoulder External Rotation  - 2 x daily - 7 x weekly - 2 sets - 10 reps - Single Arm Serratus Punches in Supine with Dumbbell  - 2 x daily - 7 x weekly - 2 sets - 10 reps  ASSESSMENT:  CLINICAL IMPRESSION: Ms Vencill requires frequent instruction and repetition of reasoning behind specific exercises that are designed to meet her goals.  Her neck pain has improved.  She is progressing toward her final goals.  She also needs frequent reminders about correct posture.  She would benefit from continued skilled PT for shoulder and postural strengthening along with pain control and manual techniques.    OBJECTIVE IMPAIRMENTS: decreased ROM, decreased strength, increased muscle spasms, impaired flexibility, impaired UE functional use, improper body mechanics, postural dysfunction, and pain.   ACTIVITY LIMITATIONS: carrying, lifting, sleeping, and reach over head  PARTICIPATION LIMITATIONS: cleaning, laundry, and community activity  PERSONAL FACTORS: Past/current experiences, Time since onset of injury/illness/exacerbation, and 3+ comorbidities: GBS, Raynaud's, OA, asthma   are also affecting patient's functional outcome.   REHAB POTENTIAL: Good  CLINICAL DECISION MAKING: Evolving/moderate complexity  EVALUATION COMPLEXITY: Moderate   GOALS: Goals reviewed with patient? Yes  SHORT TERM GOALS: Target date: 09/07/2023  Patient will be independent with initial HEP. Baseline:  Goal status: MET 09/10/23  2.  Patient will improve cervical and shoulder A/ROM to [facility] to allow her to perform various household tasks. Baseline:  Goal status:  MET 09/10/23   LONG TERM GOALS: Target date: 10/12/2023  Patient will be independent with advanced HEP to allow her self-progression after discharge. Baseline:  Goal status: Ongoing  2.  Patient will increase her functional UE  strength to [facility] to allow her to lift items and place in overhead shelves and to lift suitcases. Baseline:  Goal status: MET 09/27/23  3.  Patient will be able to participate in previous exercise classes without increased pain. Baseline:  Goal status: In progress  4.  Patient will increase FOTO to 57 to demonstrate improvements in functional tasks. Baseline: 41 Goal status: Ongoing (see above)    PLAN:  PT FREQUENCY: 2x/week  PT DURATION: 8 weeks  PLANNED INTERVENTIONS: 97164- PT Re-evaluation, 97110-Therapeutic exercises, 97530- Therapeutic activity, [id number]- Neuromuscular re-education, 97535- Self Care, [id number]- Manual therapy, [id number]- Canalith repositioning, [id number]- Aquatic Therapy, 97014- Electrical stimulation (  unattended), [phone]- Electrical stimulation (manual), 02983- Vasopneumatic device, [id number]- Ultrasound, [id number]- Traction (mechanical), 02966- Ionotophoresis 4mg /ml Dexamethasone , Patient/Family education, Balance training, Taping, Dry Needling, Joint mobilization, Joint manipulation, Spinal manipulation, Spinal mobilization, Vestibular training, Cryotherapy, and Moist heat  PLAN FOR NEXT SESSION:    Continue shoulder strengthening and scapular stabilization for the right shoulder.  Thoracic rotation exercises, shoulder depression exercises, assess and progress HEP as indicated, strengthening, ROM/flexibility, postural stabilization.   Delon B. Seher Schlagel, PT 10/25/23 5:30 PM [facility] [address], [address] [place], [place] [address] Phone # [phone] Fax [phone]

## 2023-10-29 ENCOUNTER — Encounter: Payer: Self-pay | Admitting: Family Medicine

## 2023-10-29 DIAGNOSIS — J3089 Other allergic rhinitis: Secondary | ICD-10-CM | POA: Diagnosis not present

## 2023-10-29 DIAGNOSIS — J3081 Allergic rhinitis due to animal (cat) (dog) hair and dander: Secondary | ICD-10-CM | POA: Diagnosis not present

## 2023-10-29 DIAGNOSIS — J301 Allergic rhinitis due to pollen: Secondary | ICD-10-CM | POA: Diagnosis not present

## 2023-10-30 ENCOUNTER — Ambulatory Visit: Payer: Medicare HMO

## 2023-10-30 DIAGNOSIS — M25512 Pain in left shoulder: Secondary | ICD-10-CM | POA: Diagnosis not present

## 2023-10-30 DIAGNOSIS — M6281 Muscle weakness (generalized): Secondary | ICD-10-CM | POA: Diagnosis not present

## 2023-10-30 DIAGNOSIS — M542 Cervicalgia: Secondary | ICD-10-CM

## 2023-10-30 DIAGNOSIS — R252 Cramp and spasm: Secondary | ICD-10-CM | POA: Diagnosis not present

## 2023-10-30 DIAGNOSIS — M25511 Pain in right shoulder: Secondary | ICD-10-CM

## 2023-10-30 DIAGNOSIS — R262 Difficulty in walking, not elsewhere classified: Secondary | ICD-10-CM | POA: Diagnosis not present

## 2023-10-30 DIAGNOSIS — R293 Abnormal posture: Secondary | ICD-10-CM

## 2023-10-30 NOTE — Therapy (Signed)
 OUTPATIENT PHYSICAL THERAPY TREATMENT NOTE     Patient Name: Brianna Gates MRN: [id number] DOB:1951-02-03, 73 y.o., female Today's Date: 10/30/2023  END OF SESSION:  PT End of Session - 10/30/23 1103     Visit Number 17    Date for PT Re-Evaluation 12/07/23    Authorization Type [employer]    Progress Note Due on Visit 20    PT Start Time 1104    PT Stop Time 1150    PT Time Calculation (min) 46 min    Activity Tolerance Patient tolerated treatment well    Behavior During Therapy [facility] for tasks assessed/performed              Past Medical History:  Diagnosis Date   Allergy    Anxiety    Asthma    INFREQUENT PROBLEM - rarely used inhaler   CAD (coronary artery disease) 06/29/2023   CCTA 06/27/23: CAC score 15.7 (45th percentile), mild nonobstructive CAD [mid LAD 1-24, proximal LCx 1-24]    Cervical spondylosis    Cervical strain 07/22/2014   Chest pain of uncertain etiology 12/13/2021   Complication of anesthesia    UNKNOWN REACTION WITH RASH AT SITE OF IV only with knee surgery   Degenerative arthritis    Dyslipidemia    Eczema    GERD (gastroesophageal reflux disease)    Guillain-Barre syndrome (HCC)    History of Guillain-Barre syndrome 07/22/2014   Hyperlipidemia    Hypertension    Hypothyroidism    Lumbosacral spondylosis    Palpitations 12/12/2021   Pure hypercholesterolemia 12/13/2021   Raynaud disease    Thyroid  nodule    Past Surgical History:  Procedure Laterality Date   ABDOMINAL HYSTERECTOMY     COLONOSCOPY     ESOPHAGOGASTRODUODENOSCOPY (EGD) WITH PROPOFOL  N/A 09/13/2021   Procedure: ESOPHAGOGASTRODUODENOSCOPY (EGD) WITH PROPOFOL ;  Surgeon: Abran Norleen SAILOR, MD;  Location: [facility];  Service: Endoscopy;  Laterality: N/A;   FOOT SURGERY Left    GANGLION CYST EXCISION Left    Wrist   HERNIA REPAIR     KNEE ARTHROSCOPY     RT KNEE   KNEE ARTHROTOMY Right 08/27/2013   Procedure: RIGHT KNEE SCAR EXCISION AND FEMORAL REVISION;   Surgeon: Dempsey LULLA Moan, MD;  Location: [facility];  Service: Orthopedics;  Laterality: Right;   REPLACEMENT TOTAL KNEE Right    2014, 2021   REPLACEMENT TOTAL KNEE BILATERAL Bilateral    STRABISMUS SURGERY Right    TONSILLECTOMY     vaginal vulvo prolapse  09/16/2015   Patient Active Problem List   Diagnosis Date Noted   CAD (coronary artery disease) 06/29/2023   Paresthesia 10/18/2022   Onychomycosis 01/29/2022   Chest pain of uncertain etiology 12/13/2021   Pure hypercholesterolemia 12/13/2021   Palpitations 12/12/2021   Esophageal dysphagia    Allergic rhinitis 04/04/2021   Allergic rhinitis due to animal (cat) (dog) hair and dander 04/04/2021   Allergic rhinitis due to pollen 04/04/2021   Hormone deficiency 04/04/2021   Syncope and collapse 12/27/2020   Hypotension 12/27/2020   Bradycardia 12/27/2020   History of COVID-19 12/27/2020   Preoperative evaluation to rule out surgical contraindication 07/16/2020   Arthrofibrosis of knee joint, right 11/26/2019   Arthritis of knee 07/30/2019   Presence of right artificial knee joint 07/30/2019   Sleep apnea 10/16/2018   Shoulder impingement syndrome 01/15/2018   Gastroesophageal reflux disease 02/02/2016   Sensorineural hearing loss (SNHL), bilateral 02/02/2016   Vaginal vault prolapse after hysterectomy 07/28/2015  History of Guillain-Barre syndrome 07/22/2014   Cervical strain 07/22/2014   Postoperative stiffness of total knee replacement (HCC) 08/27/2013   Abnormality of gait 08/22/2012   Cervical spondylosis without myelopathy 08/22/2012   Muscle weakness (generalized) 08/22/2012   Guillain-Barre syndrome (HCC) 08/22/2012   Essential hypertension 08/22/2012   Strabismus 08/22/2012   Degenerative joint disease of knee, left 08/22/2012   Hypothyroidism 08/22/2012   Raynaud's disease 08/22/2012   Knee joint replacement by other means 08/22/2012    PCP: Janey Santos, MD  REFERRING PROVIDER: Joane Artist RAMAN,  MD  REFERRING DIAG: M25.511,G89.29,M25.512 (ICD-10-CM) - Chronic pain of both shoulders M54.2,G89.29 (ICD-10-CM) - Neck pain, chronic  THERAPY DIAG:  Abnormal posture  Cramp and spasm  Muscle weakness (generalized)  Cervicalgia  Bilateral shoulder pain, unspecified chronicity  Rationale for Evaluation and Treatment: Rehabilitation  ONSET DATE: Has had pain for years, but it has gotten worse in past 2-3 months   SUBJECTIVE:                                                                                                                                                                                                         SUBJECTIVE STATEMENT: Patient reports she has right shoulder pain today,  her left shoulder is still bothersome as well.  My right shoulder is 5/10, my left shoulder is 4/10, and my neck is at  4/10.   Hand dominance: Right  PERTINENT HISTORY:  Bilateral TKA (with 2 revisions on right knee), HTN, Raynaud's disease, CAD, hard of hearing (wears hearing aide), asthma, OA  PAIN:  10/25/23 Are you having pain? Yes: NPRS scale: 4/10 Pain location: cervical and bilat shoulder Pain description: just there Aggravating factors: sleeping, overhead reaching Relieving factors: sometimes medication  PRECAUTIONS: None  RED FLAGS: None     WEIGHT BEARING RESTRICTIONS: No  FALLS:  Has patient fallen in last 6 months?  Has not had a fall, but has tripped recently  LIVING ENVIRONMENT: Lives with: lives with their spouse Lives in: House/apartment Stairs:  one level home with upstairs bonus room above the garage Has following equipment at home: Single point cane, Environmental Consultant - 2 wheeled, and Grab bars  OCCUPATION: Retired  PLOF: Independent and Leisure: outdoors, water  aerobics, exercise, spending time with family/friends  PATIENT GOALS: To be stronger to be able to lift items around home, like the mattress to change the sheets easier.  NEXT MD VISIT: Dr Joane on  December 9th  OBJECTIVE:  Note: Objective measures were completed at Evaluation unless otherwise noted.  DIAGNOSTIC FINDINGS:  Cervical Radiograph  on 08/13/2023: IMPRESSION: No acute osseous abnormality. Mild midcervical spine predominant degenerative changes. Assessment of the odontoid view is markedly limited due to technique.  Left shoulder radiograph on 08/13/2023: FINDINGS: There is no evidence of fracture or dislocation. Mild degenerative changes of the [facility] joint. There is no other focal bone abnormality. Soft tissues are unremarkable.  Right shoulder radiograph on 08/13/2023: FINDINGS: Degenerative spurring at the acromioclavicular joint which is narrow. Mild spurring at the [facility] joint. No acute fracture, bone lesion, or subluxation.   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  At times, has numbness and tingling in hands, as well.  POSTURE: rounded shoulders, forward head, and increased thoracic kyphosis  PALPATION: Pt with tight musculature noted in bilateral cervical region   CERVICAL ROM:   Active ROM A/PROM (deg) eval A/PROM (deg) 10/12/23  Flexion 55 52  Extension 40 32  Right lateral flexion 20 22  Left lateral flexion 20 26  Right rotation 50 50  Left rotation 45 52   (Blank rows = not tested)  UPPER EXTREMITY ROM:  Active ROM Right eval Right 10/12/23 Left eval Left 10/12/23  Shoulder flexion 135 170 130 172  Shoulder extension 70  65   Shoulder abduction 137 WNL 130 150  Shoulder ER  WNL  WNL   (Blank rows = not tested)  UPPER EXTREMITY MMT:  Eval:   Bilateral shoulder strength grossly 4 to 4+/5 throughout  10/12/23: Bilateral shoulders still grossly 4+ to 5/5  FUNCTIONAL TESTS:  Eval:   5 times sit to stand: 11.24 sec  TODAY'S  TREATMENT:                                                                                                                              DATE: 10/30/23 UBE x 6 min (PT present to discuss progress and status) 3 way scapular stabilization with yellow loop x 5 each side 4D ball rolls x 20 each direction on each UE with light blue plyo ball Prone shoulder extension, rows and horizontal abduction with 3 lbs (suggested 2 lbs but patient states she is doing 3 lbs the last time she did them at home) 2 x 10 each Side lying shoulder ER with 3 lbs 2 x 10 each Supine serratus punch 2 x 10 with 3 lbs Wing taps 2 x 10 Prone drop and catch with light blue plyo ball  2 x 10 each   DATE: 10/25/23 UBE x 6 min (PT present to discuss progress and status) 3 way scapular stabilization with yellow loop x 5 each side 4D ball rolls x 20 each direction on each UE with light blue plyo ball Wing taps 2 x 10 Prone drop and catch with light blue plyo ball  2 x 10 each  Prone shoulder extension, rows and horizontal abduction with 3 lbs (suggested 2 lbs but patient states she is doing 3 lbs the last time she did them at home) 2 x 10 each Side lying shoulder ER with 3 lbs 2 x 10 each Supine serratus punch 2 x 10 with 3 lbs  DATE: 10/12/2023 (patient late for appt) Recert assessment completed Reviewed current HEP and how to progress, precautions for overhead reaching and activities in her exercise class  DATE: 10/08/2023 UBE x 6 min (PT present to discuss 3 way scapular stabilization with yellow loop x 5 each side 4D ball rolls x 20 each direction on each UE Reviewed shoulder exercises: (prone series) Traction (mechanical) x 15 min : 20 lbs max, 10 lbs min, hold time 60 sec, rest 10 sec. 2 steps   PATIENT EDUCATION:  Education details: Issued HEP Person educated: Patient Education method: Explanation, Facilities Manager, and Handouts Education comprehension: verbalized understanding  HOME EXERCISE PROGRAM: Access Code:  [email or web address] URL: [email or web address] Date: 08/23/2023 Prepared by: Delon Haddock  Exercises - Seated Scapular Retraction  - 1 x daily - 7 x weekly - 2 sets - 10 reps - Seated Cervical Rotation AROM  - 1 x daily - 7 x weekly - 2 sets - 10 reps - Seated Cervical Sidebending AROM  - 1 x daily - 7 x weekly - 2 sets - 10 reps - Seated Cervical Retraction  - 1 x daily - 7 x weekly - 2 sets - 10 reps - Prone Shoulder Extension - Single Arm  - 2 x daily - 7 x weekly - 2 sets - 10 reps - Prone Shoulder Row  - 2 x daily - 7 x weekly - 2 sets - 10 reps - Prone Single Arm Shoulder Horizontal Abduction with Scapular Retraction and Palm Down  - 2 x daily - 7 x weekly - 2 sets - 10 reps - Sidelying Shoulder External Rotation  - 2 x daily - 7 x weekly - 2 sets - 10 reps - Single Arm Serratus Punches in Supine with Dumbbell  - 2 x daily - 7 x weekly - 2 sets - 10 reps  ASSESSMENT:  CLINICAL IMPRESSION: Ms Steveson is tolerating focused shoulder and postural exercise with no increase in pain.  She is willing to try any new strategies at gaining postural strength.  She has MRI scheduled on left shoulder.    She would benefit from continued skilled PT for shoulder and postural strengthening along with pain control and manual techniques.    OBJECTIVE IMPAIRMENTS: decreased ROM, decreased strength, increased muscle spasms, impaired flexibility, impaired UE functional use, improper body mechanics, postural dysfunction, and pain.   ACTIVITY LIMITATIONS: carrying, lifting, sleeping, and reach over head  PARTICIPATION LIMITATIONS: cleaning, laundry, and community activity  PERSONAL FACTORS: Past/current experiences, Time since onset of injury/illness/exacerbation, and 3+ comorbidities: GBS, Raynaud's, OA, asthma   are also affecting patient's functional outcome.   REHAB POTENTIAL: Good  CLINICAL DECISION MAKING: Evolving/moderate complexity  EVALUATION COMPLEXITY: Moderate   GOALS: Goals  reviewed with patient? Yes  SHORT TERM GOALS: Target date: 09/07/2023  Patient will be independent with initial HEP. Baseline:  Goal status: MET 09/10/23  2.  Patient will improve cervical and shoulder A/ROM to [facility] to allow her to perform various household tasks. Baseline:  Goal status:  MET 09/10/23   LONG TERM GOALS: Target date: 10/12/2023  Patient will be independent with advanced HEP to allow her self-progression after discharge. Baseline:  Goal status: Ongoing  2.  Patient will increase her functional UE strength to [facility] to allow her to lift items and place in overhead shelves and to lift suitcases. Baseline:  Goal status: MET 09/27/23  3.  Patient will be able to participate in previous exercise classes without increased pain. Baseline:  Goal status: In progress  4.  Patient will increase FOTO to 57 to demonstrate improvements in functional tasks. Baseline: 41 Goal status: Ongoing (see above)    PLAN:  PT FREQUENCY: 2x/week  PT DURATION: 8 weeks  PLANNED INTERVENTIONS: 97164- PT Re-evaluation, 97110-Therapeutic exercises, 97530- Therapeutic activity, [id number]- Neuromuscular re-education, 97535- Self Care, [id number]- Manual therapy, [id number]- Canalith repositioning, [id number]- Aquatic Therapy, 97014- Electrical stimulation (unattended), [id number]- Electrical stimulation (manual), [id number]- Vasopneumatic device, [id number]- Ultrasound, [id number]- Traction (mechanical), [id number]- Ionotophoresis 4mg /ml Dexamethasone , Patient/Family education, Balance training, Taping, Dry Needling, Joint mobilization, Joint manipulation, Spinal manipulation, Spinal mobilization, Vestibular training, Cryotherapy, and Moist heat  PLAN FOR NEXT SESSION:    Patient has MRI scheduled.  Continue shoulder strengthening and scapular stabilization for the left and right shoulder.  Thoracic rotation exercises, shoulder depression exercises, assess and progress HEP as indicated, strengthening, ROM/flexibility, postural  stabilization.   Delon B. Demetruis Depaul, PT 10/30/23 1:34 PM [facility] [address] [place], [place] [address] Phone # [phone] Fax [phone]

## 2023-10-31 DIAGNOSIS — H02882 Meibomian gland dysfunction right lower eyelid: Secondary | ICD-10-CM | POA: Diagnosis not present

## 2023-10-31 DIAGNOSIS — H02885 Meibomian gland dysfunction left lower eyelid: Secondary | ICD-10-CM | POA: Diagnosis not present

## 2023-10-31 DIAGNOSIS — H04123 Dry eye syndrome of bilateral lacrimal glands: Secondary | ICD-10-CM | POA: Diagnosis not present

## 2023-11-01 DIAGNOSIS — F330 Major depressive disorder, recurrent, mild: Secondary | ICD-10-CM | POA: Diagnosis not present

## 2023-11-04 ENCOUNTER — Ambulatory Visit
Admission: RE | Admit: 2023-11-04 | Discharge: 2023-11-04 | Disposition: A | Discharge status: Home / Self Care | Payer: Medicare HMO | Source: Ambulatory Visit | Attending: Family Medicine | Admitting: Family Medicine

## 2023-11-04 DIAGNOSIS — M67814 Other specified disorders of tendon, left shoulder: Secondary | ICD-10-CM | POA: Diagnosis not present

## 2023-11-04 DIAGNOSIS — G8929 Other chronic pain: Secondary | ICD-10-CM

## 2023-11-04 DIAGNOSIS — M25512 Pain in left shoulder: Secondary | ICD-10-CM | POA: Diagnosis not present

## 2023-11-07 ENCOUNTER — Ambulatory Visit: Payer: Medicare HMO

## 2023-11-07 DIAGNOSIS — M542 Cervicalgia: Secondary | ICD-10-CM

## 2023-11-07 DIAGNOSIS — R252 Cramp and spasm: Secondary | ICD-10-CM

## 2023-11-07 DIAGNOSIS — M6281 Muscle weakness (generalized): Secondary | ICD-10-CM

## 2023-11-07 DIAGNOSIS — M25511 Pain in right shoulder: Secondary | ICD-10-CM | POA: Diagnosis not present

## 2023-11-07 DIAGNOSIS — R293 Abnormal posture: Secondary | ICD-10-CM

## 2023-11-07 DIAGNOSIS — F411 Generalized anxiety disorder: Secondary | ICD-10-CM | POA: Diagnosis not present

## 2023-11-07 DIAGNOSIS — R262 Difficulty in walking, not elsewhere classified: Secondary | ICD-10-CM | POA: Diagnosis not present

## 2023-11-07 DIAGNOSIS — M25512 Pain in left shoulder: Secondary | ICD-10-CM | POA: Diagnosis not present

## 2023-11-07 NOTE — Therapy (Signed)
OUTPATIENT PHYSICAL THERAPY TREATMENT NOTE     Patient Name: Brianna Gates MRN: [id number] DOB:09/17/1951, 73 y.o., female Today's Date: 11/07/2023  END OF SESSION:  PT End of Session - 11/07/23 1410     Visit Number 18    Date for PT Re-Evaluation 12/07/23    Authorization Type [employer]    Progress Note Due on Visit 20    PT Start Time 1407    PT Stop Time 1446    PT Time Calculation (min) 39 min    Activity Tolerance Patient tolerated treatment well    Behavior During Therapy [facility] for tasks assessed/performed              Past Medical History:  Diagnosis Date   Allergy    Anxiety    Asthma    INFREQUENT PROBLEM - rarely used inhaler   CAD (coronary artery disease) 06/29/2023   CCTA 06/27/23: CAC score 15.7 (45th percentile), mild nonobstructive CAD [mid LAD 1-24, proximal LCx 1-24]    Cervical spondylosis    Cervical strain 07/22/2014   Chest pain of uncertain etiology 12/13/2021   Complication of anesthesia    UNKNOWN REACTION WITH RASH AT SITE OF IV only with knee surgery   Degenerative arthritis    Dyslipidemia    Eczema    GERD (gastroesophageal reflux disease)    Guillain-Barre syndrome (HCC)    History of Guillain-Barre syndrome 07/22/2014   Hyperlipidemia    Hypertension    Hypothyroidism    Lumbosacral spondylosis    Palpitations 12/12/2021   Pure hypercholesterolemia 12/13/2021   Raynaud disease    Thyroid nodule    Past Surgical History:  Procedure Laterality Date   ABDOMINAL HYSTERECTOMY     COLONOSCOPY     ESOPHAGOGASTRODUODENOSCOPY (EGD) WITH PROPOFOL N/A 09/13/2021   Procedure: ESOPHAGOGASTRODUODENOSCOPY (EGD) WITH PROPOFOL;  Surgeon: Hilarie Fredrickson, MD;  Location: [facility];  Service: Endoscopy;  Laterality: N/A;   FOOT SURGERY Left    GANGLION CYST EXCISION Left    Wrist   HERNIA REPAIR     KNEE ARTHROSCOPY     RT KNEE   KNEE ARTHROTOMY Right 08/27/2013   Procedure: RIGHT KNEE SCAR EXCISION AND FEMORAL REVISION;   Surgeon: Loanne Drilling, MD;  Location: [facility];  Service: Orthopedics;  Laterality: Right;   REPLACEMENT TOTAL KNEE Right    2014, 2021   REPLACEMENT TOTAL KNEE BILATERAL Bilateral    STRABISMUS SURGERY Right    TONSILLECTOMY     vaginal vulvo prolapse  09/16/2015   Patient Active Problem List   Diagnosis Date Noted   CAD (coronary artery disease) 06/29/2023   Paresthesia 10/18/2022   Onychomycosis 01/29/2022   Chest pain of uncertain etiology 12/13/2021   Pure hypercholesterolemia 12/13/2021   Palpitations 12/12/2021   Esophageal dysphagia    Allergic rhinitis 04/04/2021   Allergic rhinitis due to animal (cat) (dog) hair and dander 04/04/2021   Allergic rhinitis due to pollen 04/04/2021   Hormone deficiency 04/04/2021   Syncope and collapse 12/27/2020   Hypotension 12/27/2020   Bradycardia 12/27/2020   History of COVID-19 12/27/2020   Preoperative evaluation to rule out surgical contraindication 07/16/2020   Arthrofibrosis of knee joint, right 11/26/2019   Arthritis of knee 07/30/2019   Presence of right artificial knee joint 07/30/2019   Sleep apnea 10/16/2018   Shoulder impingement syndrome 01/15/2018   Gastroesophageal reflux disease 02/02/2016   Sensorineural hearing loss (SNHL), bilateral 02/02/2016   Vaginal vault prolapse after hysterectomy 07/28/2015  History of Guillain-Barre syndrome 07/22/2014   Cervical strain 07/22/2014   Postoperative stiffness of total knee replacement (HCC) 08/27/2013   Abnormality of gait 08/22/2012   Cervical spondylosis without myelopathy 08/22/2012   Muscle weakness (generalized) 08/22/2012   Guillain-Barre syndrome (HCC) 08/22/2012   Essential hypertension 08/22/2012   Strabismus 08/22/2012   Degenerative joint disease of knee, left 08/22/2012   Hypothyroidism 08/22/2012   Raynaud's disease 08/22/2012   Knee joint replacement by other means 08/22/2012    PCP: Chilton Greathouse, MD  REFERRING PROVIDER: Rodolph Bong,  MD  REFERRING DIAG: M25.511,G89.29,M25.512 (ICD-10-CM) - Chronic pain of both shoulders M54.2,G89.29 (ICD-10-CM) - Neck pain, chronic  THERAPY DIAG:  Abnormal posture  Cramp and spasm  Muscle weakness (generalized)  Cervicalgia  Bilateral shoulder pain, unspecified chronicity  Rationale for Evaluation and Treatment: Rehabilitation  ONSET DATE: Has had pain for years, but it has gotten worse in past 2-3 months   SUBJECTIVE:                                                                                                                                                                                                         SUBJECTIVE STATEMENT: Patient reports she hasn't had much pain since last visit but did wake up with a little this morning.   Hand dominance: Right  PERTINENT HISTORY:  Bilateral TKA (with 2 revisions on right knee), HTN, Raynaud's disease, CAD, hard of hearing (wears hearing aide), asthma, OA  PAIN:  10/25/23 Are you having pain? Yes: NPRS scale: 4/10 Pain location: cervical and bilat shoulder Pain description: "just there" Aggravating factors: sleeping, overhead reaching Relieving factors: sometimes medication  PRECAUTIONS: None  RED FLAGS: None     WEIGHT BEARING RESTRICTIONS: No  FALLS:  Has patient fallen in last 6 months?  Has not had a fall, but has tripped recently  LIVING ENVIRONMENT: Lives with: lives with their spouse Lives in: House/apartment Stairs:  one level home with upstairs bonus room above the garage Has following equipment at home: Single point cane, Environmental consultant - 2 wheeled, and Grab bars  OCCUPATION: Retired  [employer]: Independent and Leisure: outdoors, water aerobics, exercise, spending time with family/friends  PATIENT GOALS: To be stronger to be able to lift items around home, like the mattress to change the sheets easier.  NEXT MD VISIT: Dr Denyse Amass on December 9th  OBJECTIVE:  Note: Objective measures were completed at  Evaluation unless otherwise noted.  DIAGNOSTIC FINDINGS:  Cervical Radiograph on 08/13/2023: IMPRESSION: No acute osseous abnormality. Mild midcervical spine predominant degenerative changes. Assessment of the  odontoid view is markedly limited due to technique.  Left shoulder radiograph on 08/13/2023: FINDINGS: There is no evidence of fracture or dislocation. Mild degenerative changes of the [place] joint. There is no other focal bone abnormality. Soft tissues are unremarkable.  Right shoulder radiograph on 08/13/2023: FINDINGS: Degenerative spurring at the acromioclavicular joint which is narrow. Mild spurring at the [facility] joint. No acute fracture, bone lesion, or subluxation.   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  At times, has numbness and tingling in hands, as well.  POSTURE: rounded shoulders, forward head, and increased thoracic kyphosis  PALPATION: Pt with tight musculature noted in bilateral cervical region   CERVICAL ROM:   Active ROM A/PROM (deg) eval A/PROM (deg) 10/12/23  Flexion 55 52  Extension 40 32  Right lateral flexion 20 22  Left lateral flexion 20 26  Right rotation 50 50  Left rotation 45 52   (Blank rows = not tested)  UPPER EXTREMITY ROM:  Active ROM Right eval Right 10/12/23 Left eval Left 10/12/23  Shoulder flexion 135 170 130 172  Shoulder extension 70  65   Shoulder abduction 137 WNL 130 150  Shoulder ER  WNL  WNL   (Blank rows = not tested)  UPPER EXTREMITY MMT:  Eval:   Bilateral shoulder strength grossly 4 to 4+/5 throughout  10/12/23: Bilateral shoulders still grossly 4+ to 5/5  FUNCTIONAL TESTS:  Eval:   5 times sit to stand: 11.24 sec  TODAY'S TREATMENT:                                                                                                                               DATE: 11/07/23 UBE x 6 min (PT present to discuss progress and status) 3 way scapular stabilization with yellow loop x 5 each side 4D ball rolls x 20 each direction on each UE with light blue plyo ball Wing taps  x 10 Prone drop and catch with light blue plyo ball  2 x 10 each UE Open book thoracic rotation x 20  Prone shoulder extension, rows and horizontal abduction with 3 lbs (suggested 2 lbs but patient states she is doing 3 lbs the last time she did them at home) 2 x 10 each Side lying shoulder ER with 3 lbs 2 x 10 each Supine serratus punch 2 x 10 with 3 lbs    DATE: 10/30/23 UBE x 6 min (PT present to discuss progress and status) 3 way scapular stabilization with yellow loop x 5 each side 4D ball rolls x 20 each direction on each UE with light blue plyo ball Prone shoulder extension, rows and horizontal abduction with 3 lbs (suggested 2 lbs but patient states she is doing 3 lbs the last time she did them  at home) 2 x 10 each Side lying shoulder ER with 3 lbs 2 x 10 each Supine serratus punch 2 x 10 with 3 lbs Wing taps 2 x 10 Prone drop and catch with light blue plyo ball  2 x 10 each   DATE: 10/25/23 UBE x 6 min (PT present to discuss progress and status) 3 way scapular stabilization with yellow loop x 5 each side 4D ball rolls x 20 each direction on each UE with light blue plyo ball Wing taps 2 x 10 Prone drop and catch with light blue plyo ball  2 x 10 each  Prone shoulder extension, rows and horizontal abduction with 3 lbs (suggested 2 lbs but patient states she is doing 3 lbs the last time she did them at home) 2 x 10 each Side lying shoulder ER with 3 lbs 2 x 10 each Supine serratus punch 2 x 10 with 3 lbs    PATIENT EDUCATION:  Education details: Issued HEP Person educated: Patient Education method: Explanation, Facilities manager, and Handouts Education comprehension: verbalized understanding  HOME EXERCISE  PROGRAM: Access Code: [email or web address] URL: [email or web address] Date: 08/23/2023 Prepared by: Mikey Kirschner  Exercises - Seated Scapular Retraction  - 1 x daily - 7 x weekly - 2 sets - 10 reps - Seated Cervical Rotation AROM  - 1 x daily - 7 x weekly - 2 sets - 10 reps - Seated Cervical Sidebending AROM  - 1 x daily - 7 x weekly - 2 sets - 10 reps - Seated Cervical Retraction  - 1 x daily - 7 x weekly - 2 sets - 10 reps - Prone Shoulder Extension - Single Arm  - 2 x daily - 7 x weekly - 2 sets - 10 reps - Prone Shoulder Row  - 2 x daily - 7 x weekly - 2 sets - 10 reps - Prone Single Arm Shoulder Horizontal Abduction with Scapular Retraction and Palm Down  - 2 x daily - 7 x weekly - 2 sets - 10 reps - Sidelying Shoulder External Rotation  - 2 x daily - 7 x weekly - 2 sets - 10 reps - Single Arm Serratus Punches in Supine with Dumbbell  - 2 x daily - 7 x weekly - 2 sets - 10 reps  ASSESSMENT:  CLINICAL IMPRESSION: Ms First continues to tolerate focused shoulder and postural exercise with no increase in pain.  Awaiting MRI results for left shoulder.    She would benefit from continued skilled PT for shoulder and postural strengthening along with pain control and manual techniques.    OBJECTIVE IMPAIRMENTS: decreased ROM, decreased strength, increased muscle spasms, impaired flexibility, impaired UE functional use, improper body mechanics, postural dysfunction, and pain.   ACTIVITY LIMITATIONS: carrying, lifting, sleeping, and reach over head  PARTICIPATION LIMITATIONS: cleaning, laundry, and community activity  PERSONAL FACTORS: Past/current experiences, Time since onset of injury/illness/exacerbation, and 3+ comorbidities: GBS, Raynaud's, OA, asthma   are also affecting patient's functional outcome.   REHAB POTENTIAL: Good  CLINICAL DECISION MAKING: Evolving/moderate complexity  EVALUATION COMPLEXITY: Moderate   GOALS: Goals reviewed with patient? Yes  SHORT TERM  GOALS: Target date: 09/07/2023  Patient will be independent with initial HEP. Baseline:  Goal status: MET 09/10/23  2.  Patient will improve cervical and shoulder A/ROM to [facility] to allow her to perform various household tasks. Baseline:  Goal status:  MET 09/10/23   LONG TERM GOALS: Target date: 10/12/2023  Patient will be independent with advanced HEP to allow her  self-progression after discharge. Baseline:  Goal status: Ongoing  2.  Patient will increase her functional UE strength to [facility] to allow her to lift items and place in overhead shelves and to lift suitcases. Baseline:  Goal status: MET 09/27/23  3.  Patient will be able to participate in previous exercise classes without increased pain. Baseline:  Goal status: In progress  4.  Patient will increase FOTO to 57 to demonstrate improvements in functional tasks. Baseline: 41 Goal status: Ongoing (see above)    PLAN:  PT FREQUENCY: 2x/week  PT DURATION: 8 weeks  PLANNED INTERVENTIONS: 97164- PT Re-evaluation, 97110-Therapeutic exercises, 97530- Therapeutic activity, [id number]- Neuromuscular re-education, 97535- Self Care, [id number]- Manual therapy, [id number]- Canalith repositioning, [id number]- Aquatic Therapy, 97014- Electrical stimulation (unattended), [id number]- Electrical stimulation (manual), [id number]- Vasopneumatic device, [id number]- Ultrasound, [id number]- Traction (mechanical), [id number]- Ionotophoresis 4mg /ml Dexamethasone, Patient/Family education, Balance training, Taping, Dry Needling, Joint mobilization, Joint manipulation, Spinal manipulation, Spinal mobilization, Vestibular training, Cryotherapy, and Moist heat  PLAN FOR NEXT SESSION:    Patient awaiting results of MRI.  Continue shoulder strengthening and scapular stabilization for the left and right shoulder.  Thoracic rotation exercises, shoulder depression exercises, assess and progress HEP as indicated, strengthening, ROM/flexibility, postural stabilization.   Victorino Dike B. Brodyn Depuy,  PT 11/07/23 3:03 PM [facility] [address] [place], [place] [address] Phone # [phone] Fax [phone]

## 2023-11-08 ENCOUNTER — Encounter: Payer: Self-pay | Admitting: Family Medicine

## 2023-11-08 DIAGNOSIS — G8929 Other chronic pain: Secondary | ICD-10-CM

## 2023-11-08 NOTE — Progress Notes (Signed)
MRI shows tendinitis of the rotator cuff tendons and a small tiny partial tear.  I am not confident that another injection will help very much and as you already have completed physical therapy I think it may be time to have a surgical consultation.  Would you like me to refer you to an orthopedic surgeon to talk about your options do not more injections or more physical therapy would make sense versus surgery options? If you not sure you want to come talk to me about this more please let me know we will get you in for an appointment otherwise I can refer to orthopedic surgery.

## 2023-11-09 NOTE — Progress Notes (Unsigned)
Cardiology Office Note   Date:  11/12/2023   ID:  Brianna Gates, DOB Oct 12, 1951, MRN [id number]  PCP:  Chilton Greathouse, MD  Cardiologist:   Dietrich Pates, MD   Pt presents for follow up of HTN     History of Present Illness: Brianna Gates is a 73 y.o. female with a history of HTN (labile BP), dizziness,  palpitations, mild nonobstructive, HL, hypothyroidism, Raynauds. The pt was previously followed by Lavell Islam   She had been on nebivolol and chlorthalidone and losartan.   Monitor done in 2023 for palpitations   This showed rare PACs,PVCs  Sept 2024  Echo LVEF normal   Trace MR, trace AI CCTA  Sept 2024  Ca score 15.7  MIld nonobstructive dz    The pt was last seen in cardiology clinic in  October 2024    She says her BP is still up and down    90s to 1660s   Erratic. Still has some palpitations Active   Goes to [facility]   Denies CP   Breathing is OK     She is being followed for ADD     Physican wants her to try Xelstrym patch      Current Meds  Medication Sig   acetaminophen (TYLENOL) 650 MG CR tablet Take 1,300 mg by mouth every 8 (eight) hours as needed for pain or fever.   albuterol (PROVENTIL HFA;VENTOLIN HFA) 108 (90 BASE) MCG/ACT inhaler Inhale 2 puffs into the lungs every 6 (six) hours as needed for wheezing or shortness of breath.   amLODipine (NORVASC) 2.5 MG tablet Take 1 tablet (2.5 mg total) by mouth every evening.   amoxicillin (AMOXIL) 500 MG capsule Take 2,000 mg by mouth once as needed (prior to dental appointments).   Ascorbic Acid (VITAMIN C PO) Take 1 tablet by mouth as needed (sick symptoms).   aspirin EC 81 MG tablet Take 81 mg by mouth daily. Swallow whole.   azelastine (ASTELIN) 0.1 % nasal spray Place 1 spray into both nostrils 2 (two) times daily as needed (cold symptoms).   busPIRone (BUSPAR) 30 MG tablet Take 30 mg by mouth 2 (two) times daily.   Cholecalciferol (VITAMIN D) 2000 UNITS CAPS Take 2,000 Units by mouth daily in the  afternoon.   COVID-19 mRNA vaccine 2023-2024 (COMIRNATY) SUSP injection Inject into the muscle.   COVID-19 mRNA vaccine, Pfizer, (COMIRNATY) syringe Inject into the muscle.   Emollient (CETAPHIL) cream Apply 1 application topically daily.   EPINEPHrine 0.3 mg/0.3 mL IJ SOAJ injection Inject 0.3 mg into the muscle once as needed for anaphylaxis.   estradiol (ESTRACE) 0.1 MG/GM vaginal cream Place 1 Applicatorful vaginally 2 (two) times a week.   fexofenadine-pseudoephedrine (ALLEGRA-D 24) 180-240 MG 24 hr tablet Take 1 tablet by mouth daily.   gabapentin (NEURONTIN) 300 MG capsule Take 1 capsule (300 mg total) by mouth 3 (three) times daily.   guaiFENesin (MUCINEX) 600 MG 12 hr tablet Take 1,200 mg by mouth daily as needed.   ibuprofen (ADVIL,MOTRIN) 100 MG tablet Take 400 mg by mouth daily at 12 noon. May take in the pm additionally as needed   ketoconazole (NIZORAL) 2 % cream Apply 1 application topically daily. (Patient taking differently: Apply 1 application  topically as needed.)   levothyroxine (SYNTHROID) 50 MCG tablet Take 50 mcg by mouth daily before breakfast.    montelukast (SINGULAIR) 10 MG tablet Take 10 mg by mouth at bedtime.   pantoprazole (PROTONIX) 40 MG tablet Take  1 tablet (40 mg total) by mouth 2 (two) times daily. Office visit for further refills (Patient taking differently: Take 40 mg by mouth as needed. Office visit for further refills)   rosuvastatin (CRESTOR) 10 MG tablet Take 1 tablet (10 mg total) by mouth every other day.   Simethicone (PHAZYME PO) Take 250 mg by mouth daily as needed (Gas).   triamcinolone cream (KENALOG) 0.1 % 1 application as needed (itching bumps on stomach). Mixed with Cetaphil   [DISCONTINUED] busPIRone (BUSPAR) 10 MG tablet Take 10 mg by mouth 2 (two) times daily.   [DISCONTINUED] losartan (COZAAR) 25 MG tablet Take one tablet 50mg  in the morning and 25mg  in the evening.     Allergies:   Captopril, Oxaprozin, Ramipril, Zoloft [sertraline hcl],  Ace inhibitors, Doxycycline, Influenza vac split quad, Influenza vaccines, Sulfa antibiotics, and Amlodipine besylate   Past Medical History:  Diagnosis Date   Allergy    Anxiety    Asthma    INFREQUENT PROBLEM - rarely used inhaler   CAD (coronary artery disease) 06/29/2023   CCTA 06/27/23: CAC score 15.7 (45th percentile), mild nonobstructive CAD [mid LAD 1-24, proximal LCx 1-24]    Cervical spondylosis    Cervical strain 07/22/2014   Chest pain of uncertain etiology 12/13/2021   Complication of anesthesia    UNKNOWN REACTION WITH RASH AT SITE OF IV only with knee surgery   Degenerative arthritis    Dyslipidemia    Eczema    GERD (gastroesophageal reflux disease)    Guillain-Barre syndrome (HCC)    History of Guillain-Barre syndrome 07/22/2014   Hyperlipidemia    Hypertension    Hypothyroidism    Lumbosacral spondylosis    Palpitations 12/12/2021   Pure hypercholesterolemia 12/13/2021   Raynaud disease    Thyroid nodule     Past Surgical History:  Procedure Laterality Date   ABDOMINAL HYSTERECTOMY     COLONOSCOPY     ESOPHAGOGASTRODUODENOSCOPY (EGD) WITH PROPOFOL N/A 09/13/2021   Procedure: ESOPHAGOGASTRODUODENOSCOPY (EGD) WITH PROPOFOL;  Surgeon: Hilarie Fredrickson, MD;  Location: [facility];  Service: Endoscopy;  Laterality: N/A;   FOOT SURGERY Left    GANGLION CYST EXCISION Left    Wrist   HERNIA REPAIR     KNEE ARTHROSCOPY     RT KNEE   KNEE ARTHROTOMY Right 08/27/2013   Procedure: RIGHT KNEE SCAR EXCISION AND FEMORAL REVISION;  Surgeon: Loanne Drilling, MD;  Location: [facility];  Service: Orthopedics;  Laterality: Right;   REPLACEMENT TOTAL KNEE Right    2014, 2021   REPLACEMENT TOTAL KNEE BILATERAL Bilateral    STRABISMUS SURGERY Right    TONSILLECTOMY     vaginal vulvo prolapse  09/16/2015     Social History:  The patient  reports that she has never smoked. She has never used smokeless tobacco. She reports that she does not currently use alcohol. She reports  that she does not use drugs.   Family History:  The patient's family history includes Heart attack in her father; Heart disease in her father; Hemochromatosis in her brother and father; Hyperlipidemia in her brother; Hypertension in her brother and mother; Other in her brother and mother; Stomach cancer in her paternal uncle; Stroke in her mother.    ROS:  Please see the history of present illness. All other systems are reviewed and  Negative to the above problem except as noted.    PHYSICAL EXAM: VS:  BP (!) 148/82   Pulse (!) 57   Ht 5\' 4"  (1.626  m)   Wt 118 lb 3.2 oz (53.6 kg)   SpO2 96%   BMI 20.29 kg/m   GEN: Well nourished, well developed, in no acute distress  HEENT: normal  Neck: no JVD, carotid bruits Cardiac: RRR; no murmur  No LE  edema  Respiratory:  clear to auscultation GI: soft, nontender, no masses  No hepatomegaly  MS: no deformity Moving all extremities    EKG:  EKG is not ordered today.  Zio Monitor  Oct 2024  Quality: Fair.  Baseline artifact. Predominant rhythm: sinus rhythm Average heart rate: 64 bpm Max heart rate: 104 bpm in sinus rhythm.  182 in SVT Min heart rate: 44 bpm Pauses >2.5 seconds: none   Rare PACs and PVCs.  5 episodes of SVT lasting up to 14 beats   Patient symptom diary notes multiple episodes of lightheadedness and dizziness at which time sinus rhythm was noted.     Echo  Sept 2024    1. Left ventricular ejection fraction, by estimation, is 60 to 65%. The  left ventricle has normal function. The left ventricle has no regional  wall motion abnormalities. Left ventricular diastolic parameters are  consistent with Grade I diastolic  dysfunction (impaired relaxation). The average left ventricular global  longitudinal strain is -19.3 %. The global longitudinal strain is normal.   2. Right ventricular systolic function is normal. The right ventricular  size is normal.   3. The mitral valve is normal in structure. Trivial mitral  valve  regurgitation. No evidence of mitral stenosis.   4. The aortic valve is tricuspid. Aortic valve regurgitation is trivial.  No aortic stenosis is present.   5. The inferior vena cava is normal in size with greater than 50%  respiratory variability, suggesting right atrial pressure of 3 mmHg.    CCTA  Sept 2024   Pulmonary veins drain normally to the left atrium. No LA appendage thrombus.   Calcium Score: 15.7 Agatston units.   Coronary Arteries: Right dominant with no anomalies   LM: No plaque or stenosis.   LAD system: Mixed plaque in the mid LAD, mild (1-24%) stenosis.   Circumflex system: Mixed plaque proximal LCx, mild (1-24%) stenosis.   RCA system: No plaque or stenosis.   IMPRESSION: 1. Coronary artery calcium score 15.7 Agatston units. This places the patient in the 45th percentile for age and gender, suggesting low risk for future cardiac events.   2.  Mild nonobstructive CAD.   Lipid Panel    Component Value Date/Time   CHOL 156 09/14/2022 0934   TRIG 70 09/14/2022 0934   HDL 62 09/14/2022 0934   CHOLHDL 2.5 09/14/2022 0934   LDLCALC 80 09/14/2022 0934      Wt Readings from Last 3 Encounters:  11/12/23 118 lb 3.2 oz (53.6 kg)  10/22/23 116 lb (52.6 kg)  09/24/23 123 lb (55.8 kg)      ASSESSMENT AND PLAN:  1  HTN  The pt's BP has been very difficult to control Would recomm a trial of amlodipine   Take 2.5 mg at night    Keep on losartan 50 mg am    Follow BP   may need to add back PM dose of 25 mg losartan    Pt to write back in My Chart Will speak with pharmacy about ADD medication   COncern will raise BP  2  Palpitations   Occsaional    3  CAD  CCTA with mild nonobstructive dz  Pt denies CP  4  HL  Last LDL 81  HDL 62  REviewed diet    Lots of veggies   Minimally processed foods     Keep on Crestor     Current medicines are reviewed at length with the patient today.  The patient does not have concerns regarding  medicines.  Signed, Dietrich Pates, MD  11/12/2023 7:17 PM    [facility] [address] [place], [place], [place]  [address] Phone: [phone]; Fax: [phone]

## 2023-11-12 ENCOUNTER — Ambulatory Visit: Payer: Medicare HMO | Attending: Internal Medicine | Admitting: Internal Medicine

## 2023-11-12 ENCOUNTER — Encounter: Payer: Self-pay | Admitting: Internal Medicine

## 2023-11-12 DIAGNOSIS — E782 Mixed hyperlipidemia: Secondary | ICD-10-CM

## 2023-11-12 DIAGNOSIS — I10 Essential (primary) hypertension: Secondary | ICD-10-CM | POA: Diagnosis not present

## 2023-11-12 MED ORDER — AMLODIPINE BESYLATE 2.5 MG PO TABS: 2.5000 mg | ORAL_TABLET | Freq: Every evening | ORAL | 3 refills | Status: DC

## 2023-11-12 MED ORDER — LOSARTAN POTASSIUM 50 MG PO TABS: 50.0000 mg | ORAL_TABLET | Freq: Every morning | ORAL | 3 refills | Status: DC

## 2023-11-12 NOTE — Patient Instructions (Signed)
Medication Instructions:  Take Losartan 50 mg in the morning only  Start Amlodipine 2.5 mg in the evening only.  *If you need a refill on your cardiac medications before your next appointment, please call your pharmacy*   Lab Work:  If you have labs (blood work) drawn today and your tests are completely normal, you will receive your results only by: MyChart Message (if you have MyChart) OR A paper copy in the mail If you have any lab test that is abnormal or we need to change your treatment, we will call you to review the results.   Testing/Procedures:    Follow-Up: At [facility], you and your health needs are our priority.  As part of our continuing mission to provide you with exceptional heart care, we have created designated Provider Care Teams.  These Care Teams include your primary Cardiologist (physician) and Advanced Practice Providers (APPs -  Physician Assistants and Nurse Practitioners) who all work together to provide you with the care you need, when you need it.  We recommend signing up for the patient portal called "MyChart".  Sign up information is provided on this After Visit Summary.  MyChart is used to connect with patients for Virtual Visits (Telemedicine).  Patients are able to view lab/test results, encounter notes, upcoming appointments, etc.  Non-urgent messages can be sent to your provider as well.   To learn more about what you can do with MyChart, go to [email or web address].

## 2023-11-12 NOTE — Telephone Encounter (Signed)
Patient called stating that she has been having trouble getting on her computer and is not able to access MyChart so she was following up on the orthopedic referral.  I advised that the referral was sent to Dr Steward Drone.  She said that she had never heard of him and wanted to know if Dr Denyse Amass had any other people he would recommend. She did not have anyone in mind but mentioned maybe someone at [facility]?

## 2023-11-12 NOTE — Telephone Encounter (Signed)
Spoke to pt and advised the referral was placed earlier this morning and she should hear about scheduling soon. Or, she could call Dr. Serena Croissant office directly to schedule. Provided number. Advised if she wanted a different referral to please let us know. Pt verbalized understanding

## 2023-11-13 ENCOUNTER — Ambulatory Visit: Payer: Medicare HMO | Admitting: Neurology

## 2023-11-13 ENCOUNTER — Encounter: Payer: Self-pay | Admitting: Neurology

## 2023-11-13 VITALS — BP 110/68 | HR 60 | Ht 64.0 in | Wt 118.0 lb

## 2023-11-13 DIAGNOSIS — R269 Unspecified abnormalities of gait and mobility: Secondary | ICD-10-CM | POA: Diagnosis not present

## 2023-11-13 DIAGNOSIS — G610 Guillain-Barre syndrome: Secondary | ICD-10-CM

## 2023-11-13 DIAGNOSIS — R202 Paresthesia of skin: Secondary | ICD-10-CM

## 2023-11-13 MED ORDER — GABAPENTIN 400 MG PO CAPS: 400.0000 mg | ORAL_CAPSULE | Freq: Three times a day (TID) | ORAL | 5 refills | Status: DC

## 2023-11-13 NOTE — Progress Notes (Signed)
PATIENT: Brianna Gates DOB: [birth year]  REASON FOR VISIT: Follow up for GBS HISTORY FROM: Patient PRIMARY NEUROLOGIST: Dr. Terrace Arabia since Dr. 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    Dispense:  90 capsule    Refill:  5   HISTORY OF PRESENT ILLNESS: Today 11/13/23 We tried to increase gabapentin to 400 mg TID, reported side effect of blood pressure/palpations, we lowered back to 300 mg TID. Has numbness to lower extremities, sometimes feet burn. Walking barefoot on hard surfaces  is uncomfortable. Worries about her paresthesia, neuropathy symptoms worsening over time. Continues to exercise, balance class. Atrophy of left calf.   05/08/23 SS: Feels some paresthesia to her feet is going higher to ankle level. Right hand getting weaker, harder to open cans, fatigues with use. Does water aerobics, group fitness. Takes gabapentin 300 mg TID, would like to try more, notes more tingling in her extremities that is bothersome. At night her feet burn, but she is cold, can be bright red. She worries about increased paresthesia, wants to be active with her grandkids and keep up with friends. Remains committed to her sobriety.   Update  10/18/22 SS: wants to discuss getting back on gabapentin. Tried Cymbalta. When on gabapentin before when drinking alcohol, made her feel drunk. Tried higher dose Cymbalta didn't help.  She does go to [employer]. Is no longer drinking. Still has numbness to feet, tingling all day. Worse on hardwood floors. Doesn't keep her up at night, has never been a good sleeper. Feels sand between her toes. Walking is better with exercise, had right knee replacement, has scar tissue, is now stiff. Has stopped drink etoh since summer. Seeing psychiatry, for drinking mostly.   Update 10/06/21 SS: [name] here today for follow-up with history of Guillain Barr syndrome.  Is on Cymbalta for uncomfortable sensory alterations in the feet. Gabapentin made her feel drowsy. Taking Cymbalta 60 mg daily. Mostly feels like sand in between toes, ball of foot. Can't walk barefoot. No falls. The sensation isn't painful. Her right hand can tingle with overuse. Wants to stay on Cymbalta for now. Is going to see a therapist to see about ADD. If they put her on a mood medication, she may go back to gabapentin. She does water aerobics. Here alone.   HISTORY  04/07/2021 Dr. Anne Hahn: Brianna Gates is a 73 year old right-handed white female with a history of Guillain-Barre syndrome in the past.  The patient has had some residual numbness and tingling in the feet, she has some mild balance issues but she has not had any falls.  She recently had a redo of a knee replacement on the right, she has done well with recovery.  She recently had a COVID infection in January 2022.  The patient has recovered from this.  She has been on gabapentin for some of the discomfort in the feet, but she has had a lot of daytime drowsiness and some gait instability  in the evening when she took the medication.  She has stopped the medication at this point.  She is on Cymbalta taking 20 mg daily but she does not believe this helps her symptoms.  REVIEW OF SYSTEMS: Out of a complete 14 system review of symptoms, the patient complains only of the following symptoms, and all other reviewed systems are negative.  See HPI  ALLERGIES: Allergies  Allergen  Reactions   Captopril Other (See Comments)    reaction   Oxaprozin Hives and Other (See Comments)   Ramipril Cough and Other (See Comments)   Zoloft [Sertraline Hcl] Diarrhea   Ace Inhibitors     Other reaction(s): Other (See Comments) GB syndrome   Doxycycline Other (See Comments)    Burning sensation/Rash   Influenza Vac Split Quad     Other Reaction(s): Guillain-Barr syndrome   Influenza Vaccines     HX    Sulfa Antibiotics Other (See Comments)    unknown   Amlodipine Besylate Palpitations    HOME MEDICATIONS: Outpatient Medications Prior to Visit  Medication Sig Dispense Refill   acetaminophen (TYLENOL) 650 MG CR tablet Take 1,300 mg by mouth every 8 (eight) hours as needed for pain or fever.     albuterol (PROVENTIL HFA;VENTOLIN HFA) 108 (90 BASE) MCG/ACT inhaler Inhale 2 puffs into the lungs every 6 (six) hours as needed for wheezing or shortness of breath.     amLODipine (NORVASC) 2.5 MG tablet Take 1 tablet (2.5 mg total) by mouth every evening. 90 tablet 3   amoxicillin (AMOXIL) 500 MG capsule Take 2,000 mg by mouth once as needed (prior to dental appointments).     Ascorbic Acid (VITAMIN C PO) Take 1 tablet by mouth as needed (sick symptoms).     aspirin EC 81 MG tablet Take 81 mg by mouth daily. Swallow whole.     azelastine (ASTELIN) 0.1 % nasal spray Place 1 spray into both nostrils 2 (two) times daily as needed (cold symptoms).     busPIRone (BUSPAR) 30 MG tablet Take 30 mg by mouth 2 (two) times daily.     Cholecalciferol (VITAMIN D) 2000 UNITS CAPS Take 2,000 Units by mouth daily in the afternoon.     COVID-19 mRNA vaccine 2023-2024 (COMIRNATY) SUSP injection Inject into the muscle. 0.3 mL 0   COVID-19 mRNA vaccine, Pfizer, (COMIRNATY) syringe Inject into the muscle. 0.3 mL 0   Emollient (CETAPHIL) cream Apply 1 application topically daily.     EPINEPHrine 0.3 mg/0.3 mL IJ SOAJ injection Inject 0.3 mg into the muscle once as needed for anaphylaxis.     estradiol  (ESTRACE) 0.1 MG/GM vaginal cream Place 1 Applicatorful vaginally 2 (two) times a week.     fexofenadine-pseudoephedrine (ALLEGRA-D 24) 180-240 MG 24 hr tablet Take 1 tablet by mouth daily.     guaiFENesin (MUCINEX) 600 MG 12 hr tablet Take 1,200 mg by mouth daily as needed.     ibuprofen (ADVIL,MOTRIN) 100 MG tablet Take 400 mg by mouth daily at 12 noon. May take in the pm additionally as needed     ketoconazole (NIZORAL) 2 % cream Apply 1 application topically daily. (Patient taking differently: Apply 1 application  topically as needed.) 60 g 0   levothyroxine (SYNTHROID) 50 MCG tablet Take 50 mcg by mouth daily before breakfast.      losartan (COZAAR) 50 MG tablet Take 1 tablet (50 mg total) by mouth in the morning. 90 tablet 3   montelukast (SINGULAIR) 10 MG tablet Take 10  mg by mouth at bedtime.     pantoprazole (PROTONIX) 40 MG tablet Take 1 tablet (40 mg total) by mouth 2 (two) times daily. Office visit for further refills (Patient taking differently: Take 40 mg by mouth as needed. Office visit for further refills) 180 tablet 0   rosuvastatin (CRESTOR) 10 MG tablet Take 1 tablet (10 mg total) by mouth every other day. 90 tablet 3   Simethicone (PHAZYME PO) Take 250 mg by mouth daily as needed (Gas).     triamcinolone cream (KENALOG) 0.1 % 1 application as needed (itching bumps on stomach). Mixed with Cetaphil     VEVYE 0.1 % SOLN Apply 1 drop to eye 2 (two) times daily.     gabapentin (NEURONTIN) 300 MG capsule Take 1 capsule (300 mg total) by mouth 3 (three) times daily. 90 capsule 11   No facility-administered medications prior to visit.    PAST MEDICAL HISTORY: Past Medical History:  Diagnosis Date   Allergy    Anxiety    Asthma    INFREQUENT PROBLEM - rarely used inhaler   CAD (coronary artery disease) 06/29/2023   CCTA 06/27/23: CAC score 15.7 (45th percentile), mild nonobstructive CAD [mid LAD 1-24, proximal LCx 1-24]    Cervical spondylosis    Cervical strain 07/22/2014    Chest pain of uncertain etiology 12/13/2021   Complication of anesthesia    UNKNOWN REACTION WITH RASH AT SITE OF IV only with knee surgery   Degenerative arthritis    Dyslipidemia    Eczema    GERD (gastroesophageal reflux disease)    Guillain-Barre syndrome (HCC)    History of Guillain-Barre syndrome 07/22/2014   Hyperlipidemia    Hypertension    Hypothyroidism    Lumbosacral spondylosis    Palpitations 12/12/2021   Pure hypercholesterolemia 12/13/2021   Raynaud disease    Thyroid nodule     PAST SURGICAL HISTORY: Past Surgical History:  Procedure Laterality Date   ABDOMINAL HYSTERECTOMY     COLONOSCOPY     ESOPHAGOGASTRODUODENOSCOPY (EGD) WITH PROPOFOL N/A 09/13/2021   Procedure: ESOPHAGOGASTRODUODENOSCOPY (EGD) WITH PROPOFOL;  Surgeon: Hilarie Fredrickson, MD;  Location: [facility];  Service: Endoscopy;  Laterality: N/A;   FOOT SURGERY Left    GANGLION CYST EXCISION Left    Wrist   HERNIA REPAIR     KNEE ARTHROSCOPY     RT KNEE   KNEE ARTHROTOMY Right 08/27/2013   Procedure: RIGHT KNEE SCAR EXCISION AND FEMORAL REVISION;  Surgeon: Loanne Drilling, MD;  Location: [facility];  Service: Orthopedics;  Laterality: Right;   REPLACEMENT TOTAL KNEE Right    2014, 2021   REPLACEMENT TOTAL KNEE BILATERAL Bilateral    STRABISMUS SURGERY Right    TONSILLECTOMY     vaginal vulvo prolapse  09/16/2015    FAMILY HISTORY: Family History  Problem Relation Age of Onset   Hypertension Mother    Other Mother        Dyslipidemia   Stroke Mother    Heart attack Father        81s   Heart disease Father    Hemochromatosis Father    Hemochromatosis Brother    Hyperlipidemia Brother    Hypertension Brother    Other Brother        Dyslipidemia   Stomach cancer Paternal Uncle    Colon cancer Neg Hx    Rectal cancer Neg Hx    Esophageal cancer Neg Hx     SOCIAL HISTORY: Social History   Socioeconomic History  Marital status: Married    Spouse name: Not on file   Number of  children: 2   Years of education: college   Highest education level: Not on file  Occupational History    Employer: UNEMPLOYED  Tobacco Use   Smoking status: Never   Smokeless tobacco: Never  Vaping Use   Vaping status: Never Used  Substance and Sexual Activity   Alcohol use: Not Currently    Comment: stopped drinking   Drug use: No   Sexual activity: Yes    Birth control/protection: Other-see comments, Post-menopausal    Comment: Hysterectomy  Other Topics Concern   Not on file  Social History Narrative   Lives at home, married   Patient is right handed.   Patient drinks 2-3 cups caffeine daily.   Social Drivers of Corporate investment banker Strain: Not on file  Food Insecurity: Not on file  Transportation Needs: Not on file  Physical Activity: Not on file  Stress: Not on file  Social Connections: Not on file  Intimate Partner Violence: Not on file   PHYSICAL EXAM  Vitals:   11/13/23 1453  BP: 110/68  Pulse: 60  Weight: 118 lb (53.5 kg)  Height: 5\' 4"  (1.626 m)   Body mass index is 20.25 kg/m.  Generalized: Well developed, in no acute distress  Neurological examination  Mentation: Alert oriented to time, place, history taking. Follows all commands speech and language fluent Cranial nerve II-XII: Pupils were equal round reactive to light. Extraocular movements were full, visual field were full on confrontational test. Facial sensation and strength were normal. Head turning and shoulder shrug were normal and symmetric. Motor: The motor testing reveals 5 over 5 strength of all 4 extremities. Good symmetric motor tone is noted throughout.  Atrophy to left lower leg, calf Sensory: Decreased soft touch sensation, pinprick to ankle level bilaterally Coordination: Cerebellar testing reveals good finger-nose-finger and heel-to-shin bilaterally.  Gait and station: Gait is normal, steady, with walking barefoot, her toes tend to grip the ground,harder to walk on tip toe,  tandem gait is steady forward and back Reflexes: Deep tendon reflexes are symmetric and normal  DIAGNOSTIC DATA (LABS, IMAGING, TESTING) - I reviewed patient records, labs, notes, testing and imaging myself where available.  Lab Results  Component Value Date   WBC 5.4 06/20/2023   HGB 14.3 06/20/2023   HCT 44.4 06/20/2023   MCV 99 (H) 06/20/2023   PLT 198 06/20/2023      Component Value Date/Time   NA 144 06/20/2023 1219   K 4.0 06/20/2023 1219   CL 100 06/20/2023 1219   CO2 27 06/20/2023 1219   GLUCOSE 91 06/20/2023 1219   GLUCOSE 100 (H) 10/30/2021 0808   BUN 16 06/20/2023 1219   CREATININE 0.81 06/20/2023 1219   CALCIUM 9.9 06/20/2023 1219   PROT 6.3 09/14/2022 0934   ALBUMIN 4.7 09/14/2022 0934   AST 24 09/14/2022 0934   ALT 20 09/14/2022 0934   ALKPHOS 79 09/14/2022 0934   BILITOT 0.4 09/14/2022 0934   GFRNONAA >60 10/30/2021 0808   GFRAA >90 08/29/2013 0405   Lab Results  Component Value Date   CHOL 156 09/14/2022   HDL 62 09/14/2022   LDLCALC 80 09/14/2022   TRIG 70 09/14/2022   CHOLHDL 2.5 09/14/2022   No results found for: "HGBA1C" No results found for: "VITAMINB12" Lab Results  Component Value Date   TSH 1.020 06/20/2023   Margie Ege, AGNP-C, DNP 11/13/2023, 3:18 PM [facility]  [address], [address] [place], [place] [address] [phone]

## 2023-11-13 NOTE — Patient Instructions (Addendum)
Check nerve conduction for look for change  Increase gabapentin 400 mg 3 times daily

## 2023-11-14 ENCOUNTER — Ambulatory Visit: Payer: Medicare HMO

## 2023-11-14 DIAGNOSIS — M25511 Pain in right shoulder: Secondary | ICD-10-CM

## 2023-11-14 DIAGNOSIS — R293 Abnormal posture: Secondary | ICD-10-CM

## 2023-11-14 DIAGNOSIS — R252 Cramp and spasm: Secondary | ICD-10-CM | POA: Diagnosis not present

## 2023-11-14 DIAGNOSIS — J3081 Allergic rhinitis due to animal (cat) (dog) hair and dander: Secondary | ICD-10-CM | POA: Diagnosis not present

## 2023-11-14 DIAGNOSIS — R262 Difficulty in walking, not elsewhere classified: Secondary | ICD-10-CM | POA: Diagnosis not present

## 2023-11-14 DIAGNOSIS — M542 Cervicalgia: Secondary | ICD-10-CM

## 2023-11-14 DIAGNOSIS — M6281 Muscle weakness (generalized): Secondary | ICD-10-CM

## 2023-11-14 DIAGNOSIS — M25512 Pain in left shoulder: Secondary | ICD-10-CM | POA: Diagnosis not present

## 2023-11-14 DIAGNOSIS — J3089 Other allergic rhinitis: Secondary | ICD-10-CM | POA: Diagnosis not present

## 2023-11-14 NOTE — Progress Notes (Signed)
Chart reviewed, agree above plan ?

## 2023-11-14 NOTE — Therapy (Signed)
OUTPATIENT PHYSICAL THERAPY TREATMENT NOTE     Patient Name: Brianna Gates MRN: [id number] DOB:31-Oct-1950, 73 y.o., female Today's Date: 11/14/2023  END OF SESSION:  PT End of Session - 11/14/23 1403     Visit Number 19    Date for PT Re-Evaluation 12/07/23    Authorization Type [employer]    Progress Note Due on Visit 20    PT Start Time 1400    PT Stop Time 1445    PT Time Calculation (min) 45 min    Activity Tolerance Patient tolerated treatment well    Behavior During Therapy [facility] for tasks assessed/performed              Past Medical History:  Diagnosis Date   Allergy    Anxiety    Asthma    INFREQUENT PROBLEM - rarely used inhaler   CAD (coronary artery disease) 06/29/2023   CCTA 06/27/23: CAC score 15.7 (45th percentile), mild nonobstructive CAD [mid LAD 1-24, proximal LCx 1-24]    Cervical spondylosis    Cervical strain 07/22/2014   Chest pain of uncertain etiology 12/13/2021   Complication of anesthesia    UNKNOWN REACTION WITH RASH AT SITE OF IV only with knee surgery   Degenerative arthritis    Dyslipidemia    Eczema    GERD (gastroesophageal reflux disease)    Guillain-Barre syndrome (HCC)    History of Guillain-Barre syndrome 07/22/2014   Hyperlipidemia    Hypertension    Hypothyroidism    Lumbosacral spondylosis    Palpitations 12/12/2021   Pure hypercholesterolemia 12/13/2021   Raynaud disease    Thyroid nodule    Past Surgical History:  Procedure Laterality Date   ABDOMINAL HYSTERECTOMY     COLONOSCOPY     ESOPHAGOGASTRODUODENOSCOPY (EGD) WITH PROPOFOL N/A 09/13/2021   Procedure: ESOPHAGOGASTRODUODENOSCOPY (EGD) WITH PROPOFOL;  Surgeon: Hilarie Fredrickson, MD;  Location: [facility];  Service: Endoscopy;  Laterality: N/A;   FOOT SURGERY Left    GANGLION CYST EXCISION Left    Wrist   HERNIA REPAIR     KNEE ARTHROSCOPY     RT KNEE   KNEE ARTHROTOMY Right 08/27/2013   Procedure: RIGHT KNEE SCAR EXCISION AND FEMORAL REVISION;   Surgeon: Loanne Drilling, MD;  Location: [facility];  Service: Orthopedics;  Laterality: Right;   REPLACEMENT TOTAL KNEE Right    2014, 2021   REPLACEMENT TOTAL KNEE BILATERAL Bilateral    STRABISMUS SURGERY Right    TONSILLECTOMY     vaginal vulvo prolapse  09/16/2015   Patient Active Problem List   Diagnosis Date Noted   CAD (coronary artery disease) 06/29/2023   Paresthesia 10/18/2022   Onychomycosis 01/29/2022   Chest pain of uncertain etiology 12/13/2021   Pure hypercholesterolemia 12/13/2021   Palpitations 12/12/2021   Esophageal dysphagia    Allergic rhinitis 04/04/2021   Allergic rhinitis due to animal (cat) (dog) hair and dander 04/04/2021   Allergic rhinitis due to pollen 04/04/2021   Hormone deficiency 04/04/2021   Syncope and collapse 12/27/2020   Hypotension 12/27/2020   Bradycardia 12/27/2020   History of COVID-19 12/27/2020   Preoperative evaluation to rule out surgical contraindication 07/16/2020   Arthrofibrosis of knee joint, right 11/26/2019   Arthritis of knee 07/30/2019   Presence of right artificial knee joint 07/30/2019   Sleep apnea 10/16/2018   Shoulder impingement syndrome 01/15/2018   Gastroesophageal reflux disease 02/02/2016   Sensorineural hearing loss (SNHL), bilateral 02/02/2016   Vaginal vault prolapse after hysterectomy 07/28/2015  History of Guillain-Barre syndrome 07/22/2014   Cervical strain 07/22/2014   Postoperative stiffness of total knee replacement (HCC) 08/27/2013   Abnormality of gait 08/22/2012   Cervical spondylosis without myelopathy 08/22/2012   Muscle weakness (generalized) 08/22/2012   Guillain-Barre syndrome (HCC) 08/22/2012   Essential hypertension 08/22/2012   Strabismus 08/22/2012   Degenerative joint disease of knee, left 08/22/2012   Hypothyroidism 08/22/2012   Raynaud's disease 08/22/2012   Knee joint replacement by other means 08/22/2012    PCP: Chilton Greathouse, MD  REFERRING PROVIDER: Rodolph Bong,  MD  REFERRING DIAG: M25.511,G89.29,M25.512 (ICD-10-CM) - Chronic pain of both shoulders M54.2,G89.29 (ICD-10-CM) - Neck pain, chronic  THERAPY DIAG:  Abnormal posture  Cramp and spasm  Muscle weakness (generalized)  Cervicalgia  Bilateral shoulder pain, unspecified chronicity  Rationale for Evaluation and Treatment: Rehabilitation  ONSET DATE: Has had pain for years, but it has gotten worse in past 2-3 months   SUBJECTIVE:                                                                                                                                                                                                         SUBJECTIVE STATEMENT: Patient reports she hasn't had much pain since last visit but did wake up with a little this morning.   Hand dominance: Right  PERTINENT HISTORY:  Bilateral TKA (with 2 revisions on right knee), HTN, Raynaud's disease, CAD, hard of hearing (wears hearing aide), asthma, OA  PAIN:  11/14/23 Are you having pain? Yes: NPRS scale: 2/10 Pain location: cervical and bilat shoulder Pain description: "just there" Aggravating factors: sleeping, overhead reaching Relieving factors: sometimes medication  PRECAUTIONS: None  RED FLAGS: None     WEIGHT BEARING RESTRICTIONS: No  FALLS:  Has patient fallen in last 6 months?  Has not had a fall, but has tripped recently  LIVING ENVIRONMENT: Lives with: lives with their spouse Lives in: House/apartment Stairs:  one level home with upstairs bonus room above the garage Has following equipment at home: Single point cane, Environmental consultant - 2 wheeled, and Grab bars  OCCUPATION: Retired  [employer]: Independent and Leisure: outdoors, water aerobics, exercise, spending time with family/friends  PATIENT GOALS: To be stronger to be able to lift items around home, like the mattress to change the sheets easier.  NEXT MD VISIT: Dr Denyse Amass on December 9th  OBJECTIVE:  Note: Objective measures were completed at  Evaluation unless otherwise noted.  DIAGNOSTIC FINDINGS:  Cervical Radiograph on 08/13/2023: IMPRESSION: No acute osseous abnormality. Mild midcervical spine predominant degenerative changes. Assessment of the  odontoid view is markedly limited due to technique.  Left shoulder radiograph on 08/13/2023: FINDINGS: There is no evidence of fracture or dislocation. Mild degenerative changes of the [facility] joint. There is no other focal bone abnormality. Soft tissues are unremarkable.  Right shoulder radiograph on 08/13/2023: FINDINGS: Degenerative spurring at the acromioclavicular joint which is narrow. Mild spurring at the [facility] joint. No acute fracture, bone lesion, or subluxation.   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  At times, has numbness and tingling in hands, as well.  POSTURE: rounded shoulders, forward head, and increased thoracic kyphosis  PALPATION: Pt with tight musculature noted in bilateral cervical region   CERVICAL ROM:   Active ROM A/PROM (deg) eval A/PROM (deg) 10/12/23  Flexion 55 52  Extension 40 32  Right lateral flexion 20 22  Left lateral flexion 20 26  Right rotation 50 50  Left rotation 45 52   (Blank rows = not tested)  UPPER EXTREMITY ROM:  Active ROM Right eval Right 10/12/23 Left eval Left 10/12/23 Left 11/14/23  Shoulder flexion 135 170 130 172 wnl  Shoulder extension 70  65    Shoulder abduction 137 WNL 130 150 wnl  Shoulder ER  WNL  WNL wnl   (Blank rows = not tested)  UPPER EXTREMITY MMT:  Eval:   Bilateral shoulder strength grossly 4 to 4+/5 throughout  10/12/23: Bilateral shoulders still grossly 4+ to 5/5  FUNCTIONAL TESTS:  Eval:   5 times sit to stand: 11.24 sec  TODAY'S TREATMENT:                                                                                                                               DATE: 11/14/23 Reviewed MRI results Lengthy discussion regarding reaching plateau.  Patient will be seeing orthopedic MD to discuss MRI results, options that MD may discuss, importance of continuing HEP and modifying workouts in class.   Reviewed HEP and discussed holding on next visit.  Will proceed based on meeting with ortho MD.    DATE: 11/07/23 UBE x 6 min (PT present to discuss progress and status) 3 way scapular stabilization with yellow loop x 5 each side 4D ball rolls x 20 each direction on each UE with light blue plyo ball Wing taps  x 10 Prone drop and catch with light blue plyo ball  2 x 10 each UE Open book thoracic rotation x 20  Prone shoulder extension, rows and horizontal abduction with 3 lbs (suggested 2 lbs but patient states she is doing 3 lbs the last time she did them at home) 2 x 10 each Side lying shoulder ER with 3 lbs 2 x 10 each Supine serratus punch 2 x 10 with 3 lbs    DATE: 10/30/23 UBE  x 6 min (PT present to discuss progress and status) 3 way scapular stabilization with yellow loop x 5 each side 4D ball rolls x 20 each direction on each UE with light blue plyo ball Prone shoulder extension, rows and horizontal abduction with 3 lbs (suggested 2 lbs but patient states she is doing 3 lbs the last time she did them at home) 2 x 10 each Side lying shoulder ER with 3 lbs 2 x 10 each Supine serratus punch 2 x 10 with 3 lbs Wing taps 2 x 10 Prone drop and catch with light blue plyo ball  2 x 10 each   DATE: 10/25/23 UBE x 6 min (PT present to discuss progress and status) 3 way scapular stabilization with yellow loop x 5 each side 4D ball rolls x 20 each direction on each UE with light blue plyo ball Wing taps 2 x 10 Prone drop and catch with light blue plyo ball  2 x 10 each  Prone shoulder extension, rows and horizontal abduction with 3 lbs (suggested 2 lbs but patient states she is  doing 3 lbs the last time she did them at home) 2 x 10 each Side lying shoulder ER with 3 lbs 2 x 10 each Supine serratus punch 2 x 10 with 3 lbs    PATIENT EDUCATION:  Education details: Issued HEP Person educated: Patient Education method: Explanation, Facilities manager, and Handouts Education comprehension: verbalized understanding  HOME EXERCISE PROGRAM: Access Code: [email or web address] URL: [email or web address] Date: 08/23/2023 Prepared by: Mikey Kirschner  Exercises - Seated Scapular Retraction  - 1 x daily - 7 x weekly - 2 sets - 10 reps - Seated Cervical Rotation AROM  - 1 x daily - 7 x weekly - 2 sets - 10 reps - Seated Cervical Sidebending AROM  - 1 x daily - 7 x weekly - 2 sets - 10 reps - Seated Cervical Retraction  - 1 x daily - 7 x weekly - 2 sets - 10 reps - Prone Shoulder Extension - Single Arm  - 2 x daily - 7 x weekly - 2 sets - 10 reps - Prone Shoulder Row  - 2 x daily - 7 x weekly - 2 sets - 10 reps - Prone Single Arm Shoulder Horizontal Abduction with Scapular Retraction and Palm Down  - 2 x daily - 7 x weekly - 2 sets - 10 reps - Sidelying Shoulder External Rotation  - 2 x daily - 7 x weekly - 2 sets - 10 reps - Single Arm Serratus Punches in Supine with Dumbbell  - 2 x daily - 7 x weekly - 2 sets - 10 reps  ASSESSMENT:  CLINICAL IMPRESSION: Ms Needs is reaching a plateau.  She is able to do fairly high level of function based on her age.  She does continue to show impingement signs bilaterally and has crepitus with overhead reach.  She has functional ROM and strength and does not seem to be lacking function other than repeated overhead reach and some of her exercises in her group workout.  She has made significant progress and is independent with HEP.  She does need reminders about technique and specific exercises to avoid in her class.  MRI shows mild tearing left RC with cyst and musculotendinous junction of infraspinatus.  She does have audible crepitus  with overhead reach.  We have instructed patient in comprehensive RC strengthening and scapular stabilzation along with cervical ROM and postural strengthening.  We will proceed based on patients  visit with ortho MD.     OBJECTIVE IMPAIRMENTS: decreased ROM, decreased strength, increased muscle spasms, impaired flexibility, impaired UE functional use, improper body mechanics, postural dysfunction, and pain.   ACTIVITY LIMITATIONS: carrying, lifting, sleeping, and reach over head  PARTICIPATION LIMITATIONS: cleaning, laundry, and community activity  PERSONAL FACTORS: Past/current experiences, Time since onset of injury/illness/exacerbation, and 3+ comorbidities: GBS, Raynaud's, OA, asthma   are also affecting patient's functional outcome.   REHAB POTENTIAL: Good  CLINICAL DECISION MAKING: Evolving/moderate complexity  EVALUATION COMPLEXITY: Moderate   GOALS: Goals reviewed with patient? Yes  SHORT TERM GOALS: Target date: 09/07/2023  Patient will be independent with initial HEP. Baseline:  Goal status: MET 09/10/23  2.  Patient will improve cervical and shoulder A/ROM to [facility] to allow her to perform various household tasks. Baseline:  Goal status:  MET 09/10/23   LONG TERM GOALS: Target date: 10/12/2023  Patient will be independent with advanced HEP to allow her self-progression after discharge. Baseline:  Goal status: Ongoing  2.  Patient will increase her functional UE strength to [facility] to allow her to lift items and place in overhead shelves and to lift suitcases. Baseline:  Goal status: MET 09/27/23  3.  Patient will be able to participate in previous exercise classes without increased pain. Baseline:  Goal status: In progress  4.  Patient will increase FOTO to 57 to demonstrate improvements in functional tasks. Baseline: 41 Goal status: Ongoing (see above)    PLAN:  PT FREQUENCY: 2x/week  PT DURATION: 8 weeks  PLANNED INTERVENTIONS: 97164- PT Re-evaluation,  97110-Therapeutic exercises, 97530- Therapeutic activity, [id number]- Neuromuscular re-education, 97535- Self Care, 16109- Manual therapy, [id number]- Canalith repositioning, [id number]- Aquatic Therapy, 97014- Electrical stimulation (unattended), [id number]- Electrical stimulation (manual), [id number]- Vasopneumatic device, [id number]- Ultrasound, [id number]- Traction (mechanical), [id number]- Ionotophoresis 4mg /ml Dexamethasone, Patient/Family education, Balance training, Taping, Dry Needling, Joint mobilization, Joint manipulation, Spinal manipulation, Spinal mobilization, Vestibular training, Cryotherapy, and Moist heat  PLAN FOR NEXT SESSION:    Patient will see ortho MD for assessment of the shoulders.  Will contact Dr. Steward Drone to summarize patients episode of care prior to appt.   Victorino Dike B. Becky Colan, PT 11/14/23 10:00 PM [facility] [address], [address] [place], [place] [address] Phone # [phone] Fax [phone]

## 2023-11-21 ENCOUNTER — Ambulatory Visit (HOSPITAL_BASED_OUTPATIENT_CLINIC_OR_DEPARTMENT_OTHER): Payer: Self-pay | Admitting: Orthopaedic Surgery

## 2023-11-21 ENCOUNTER — Other Ambulatory Visit (HOSPITAL_BASED_OUTPATIENT_CLINIC_OR_DEPARTMENT_OTHER): Payer: Self-pay

## 2023-11-21 ENCOUNTER — Telehealth: Payer: Self-pay | Admitting: Internal Medicine

## 2023-11-21 ENCOUNTER — Ambulatory Visit (HOSPITAL_BASED_OUTPATIENT_CLINIC_OR_DEPARTMENT_OTHER): Payer: Medicare HMO | Admitting: Orthopaedic Surgery

## 2023-11-21 DIAGNOSIS — E782 Mixed hyperlipidemia: Secondary | ICD-10-CM

## 2023-11-21 DIAGNOSIS — M7582 Other shoulder lesions, left shoulder: Secondary | ICD-10-CM

## 2023-11-21 DIAGNOSIS — I10 Essential (primary) hypertension: Secondary | ICD-10-CM

## 2023-11-21 MED ORDER — OXYCODONE HCL 5 MG PO TABS
5.0000 mg | ORAL_TABLET | ORAL | 0 refills | Status: AC | PRN
  Filled 2023-11-21: qty 30, 5d supply, fill #0

## 2023-11-21 MED ORDER — ACETAMINOPHEN 500 MG PO TABS
500.0000 mg | ORAL_TABLET | Freq: Three times a day (TID) | ORAL | 0 refills | Status: AC
Start: 2023-11-21 — End: 2023-12-01
  Filled 2023-11-21: qty 30, 10d supply, fill #0

## 2023-11-21 MED ORDER — ASPIRIN 325 MG PO TBEC
325.0000 mg | DELAYED_RELEASE_TABLET | Freq: Every day | ORAL | 0 refills | Status: DC
  Filled 2023-11-21: qty 14, 14d supply, fill #0

## 2023-11-21 MED ORDER — ROSUVASTATIN CALCIUM 10 MG PO TABS: 10.0000 mg | ORAL_TABLET | ORAL | 3 refills | Status: DC

## 2023-11-21 NOTE — Progress Notes (Signed)
 Chief Complaint: Left shoulder pain     History of Present Illness:    Brianna Gates is a 73 y.o. female right-hand-dominant female presents with ongoing left shoulder pain particular with overhead activity.  She does state that she was told that she did have a partial tear 12 years ago with [facility].  She is very active and enjoys multiple fitness classes but has not been able to perform overhead ADLs or even go through full motion in the pool.  She does enjoy water  aerobics class.  She has been experiencing pain as well posterior to the scapula.  She has previously had physical therapy as well as an injection.  She is here today for further discussion as a referral from Dr. Joane    PMH/PSH/Family History/Social History/Meds/Allergies:    Past Medical History:  Diagnosis Date   Allergy    Anxiety    Asthma    INFREQUENT PROBLEM - rarely used inhaler   CAD (coronary artery disease) 06/29/2023   CCTA 06/27/23: CAC score 15.7 (45th percentile), mild nonobstructive CAD [mid LAD 1-24, proximal LCx 1-24]    Cervical spondylosis    Cervical strain 07/22/2014   Chest pain of uncertain etiology 12/13/2021   Complication of anesthesia    UNKNOWN REACTION WITH RASH AT SITE OF IV only with knee surgery   Degenerative arthritis    Dyslipidemia    Eczema    GERD (gastroesophageal reflux disease)    Guillain-Barre syndrome (HCC)    History of Guillain-Barre syndrome 07/22/2014   Hyperlipidemia    Hypertension    Hypothyroidism    Lumbosacral spondylosis    Palpitations 12/12/2021   Pure hypercholesterolemia 12/13/2021   Raynaud disease    Thyroid  nodule    Past Surgical History:  Procedure Laterality Date   ABDOMINAL HYSTERECTOMY     COLONOSCOPY     ESOPHAGOGASTRODUODENOSCOPY (EGD) WITH PROPOFOL  N/A 09/13/2021   Procedure: ESOPHAGOGASTRODUODENOSCOPY (EGD) WITH PROPOFOL ;  Surgeon: Abran Norleen SAILOR, MD;  Location: [facility];  Service: Endoscopy;  Laterality: N/A;    FOOT SURGERY Left    GANGLION CYST EXCISION Left    Wrist   HERNIA REPAIR     KNEE ARTHROSCOPY     RT KNEE   KNEE ARTHROTOMY Right 08/27/2013   Procedure: RIGHT KNEE SCAR EXCISION AND FEMORAL REVISION;  Surgeon: Dempsey LULLA Moan, MD;  Location: [facility];  Service: Orthopedics;  Laterality: Right;   REPLACEMENT TOTAL KNEE Right    2014, 2021   REPLACEMENT TOTAL KNEE BILATERAL Bilateral    STRABISMUS SURGERY Right    TONSILLECTOMY     vaginal vulvo prolapse  09/16/2015   Social History   Socioeconomic History   Marital status: Married    Spouse name: Not on file   Number of children: 2   Years of education: college   Highest education level: Not on file  Occupational History    Employer: UNEMPLOYED  Tobacco Use   Smoking status: Never   Smokeless tobacco: Never  Vaping Use   Vaping status: Never Used  Substance and Sexual Activity   Alcohol  use: Not Currently    Comment: stopped drinking   Drug use: No   Sexual activity: Yes    Birth control/protection: Other-see comments, Post-menopausal    Comment: Hysterectomy  Other Topics Concern   Not on file  Social History Narrative   Lives at home, married   Patient is right handed.   Patient drinks 2-3 cups caffeine daily.   Social  Drivers of Corporate Investment Banker Strain: Not on file  Food Insecurity: Not on file  Transportation Needs: Not on file  Physical Activity: Not on file  Stress: Not on file  Social Connections: Not on file   Family History  Problem Relation Age of Onset   Hypertension Mother    Other Mother        Dyslipidemia   Stroke Mother    Heart attack Father        1s   Heart disease Father    Hemochromatosis Father    Hemochromatosis Brother    Hyperlipidemia Brother    Hypertension Brother    Other Brother        Dyslipidemia   Stomach cancer Paternal Uncle    Colon cancer Neg Hx    Rectal cancer Neg Hx    Esophageal cancer Neg Hx    Allergies  Allergen Reactions   Captopril  Other (See Comments)    reaction   Oxaprozin Hives and Other (See Comments)   Ramipril Cough and Other (See Comments)   Zoloft [Sertraline Hcl] Diarrhea   Ace Inhibitors     Other reaction(s): Other (See Comments) GB syndrome   Doxycycline Other (See Comments)    Burning sensation/Rash   Influenza Vac Split Quad     Other Reaction(s): Guillain-Barr syndrome   Influenza Vaccines     HX    Sulfa Antibiotics Other (See Comments)    unknown   Amlodipine  Besylate Palpitations   Current Outpatient Medications  Medication Sig Dispense Refill   acetaminophen  (TYLENOL ) 500 MG tablet Take 1 tablet (500 mg total) by mouth every 8 (eight) hours for 10 days. 30 tablet 0   aspirin  EC 325 MG tablet Take 1 tablet (325 mg total) by mouth daily. 14 tablet 0   oxyCODONE  (ROXICODONE ) 5 MG immediate release tablet Take 1 tablet (5 mg total) by mouth every 4 (four) hours as needed for severe pain (pain score 7-10) or breakthrough pain. 30 tablet 0   acetaminophen  (TYLENOL ) 650 MG CR tablet Take 1,300 mg by mouth every 8 (eight) hours as needed for pain or fever.     albuterol  (PROVENTIL  HFA;VENTOLIN  HFA) 108 (90 BASE) MCG/ACT inhaler Inhale 2 puffs into the lungs every 6 (six) hours as needed for wheezing or shortness of breath.     amLODipine  (NORVASC ) 2.5 MG tablet Take 1 tablet (2.5 mg total) by mouth every evening. 90 tablet 3   amoxicillin (AMOXIL) 500 MG capsule Take 2,000 mg by mouth once as needed (prior to dental appointments).     Ascorbic Acid (VITAMIN C PO) Take 1 tablet by mouth as needed (sick symptoms).     aspirin  EC 81 MG tablet Take 81 mg by mouth daily. Swallow whole.     azelastine (ASTELIN) 0.1 % nasal spray Place 1 spray into both nostrils 2 (two) times daily as needed (cold symptoms).     busPIRone (BUSPAR) 30 MG tablet Take 30 mg by mouth 2 (two) times daily.     Cholecalciferol (VITAMIN D ) 2000 UNITS CAPS Take 2,000 Units by mouth daily in the afternoon.     COVID-19 mRNA vaccine  2023-2024 (COMIRNATY ) SUSP injection Inject into the muscle. 0.3 mL 0   COVID-19 mRNA vaccine, Pfizer, (COMIRNATY ) syringe Inject into the muscle. 0.3 mL 0   Emollient (CETAPHIL) cream Apply 1 application topically daily.     EPINEPHrine  0.3 mg/0.3 mL IJ SOAJ injection Inject 0.3 mg into the muscle once as needed  for anaphylaxis.     estradiol  (ESTRACE ) 0.1 MG/GM vaginal cream Place 1 Applicatorful vaginally 2 (two) times a week.     fexofenadine-pseudoephedrine (ALLEGRA-D 24) 180-240 MG 24 hr tablet Take 1 tablet by mouth daily.     gabapentin  (NEURONTIN ) 400 MG capsule Take 1 capsule (400 mg total) by mouth 3 (three) times daily. 90 capsule 5   guaiFENesin (MUCINEX) 600 MG 12 hr tablet Take 1,200 mg by mouth daily as needed.     ibuprofen (ADVIL,MOTRIN) 100 MG tablet Take 400 mg by mouth daily at 12 noon. May take in the pm additionally as needed     ketoconazole  (NIZORAL ) 2 % cream Apply 1 application topically daily. (Patient taking differently: Apply 1 application  topically as needed.) 60 g 0   levothyroxine  (SYNTHROID ) 50 MCG tablet Take 50 mcg by mouth daily before breakfast.      losartan  (COZAAR ) 50 MG tablet Take 1 tablet (50 mg total) by mouth in the morning. 90 tablet 3   montelukast  (SINGULAIR ) 10 MG tablet Take 10 mg by mouth at bedtime.     pantoprazole  (PROTONIX ) 40 MG tablet Take 1 tablet (40 mg total) by mouth 2 (two) times daily. Office visit for further refills (Patient taking differently: Take 40 mg by mouth as needed. Office visit for further refills) 180 tablet 0   rosuvastatin  (CRESTOR ) 10 MG tablet Take 1 tablet (10 mg total) by mouth every other day. 90 tablet 3   Simethicone (PHAZYME PO) Take 250 mg by mouth daily as needed (Gas).     triamcinolone  cream (KENALOG ) 0.1 % 1 application as needed (itching bumps on stomach). Mixed with Cetaphil     VEVYE 0.1 % SOLN Apply 1 drop to eye 2 (two) times daily.     No current facility-administered medications for this visit.    No results found.  Review of Systems:   A ROS was performed including pertinent positives and negatives as documented in the HPI.  Physical Exam :   Constitutional: NAD and appears stated age Neurological: Alert and oriented Psych: Appropriate affect and cooperative There were no vitals taken for this visit.   Comprehensive Musculoskeletal Exam:    Left shoulder with tenderness about the anterior superior deltoid as well as the posterior medial border of the scapula.  When the arm is in approximately 140 degrees of flexion she does have pain with weakness in resisted flexion.  Otherwise she is able to actively forward flex to approximately 170 degrees.  There is scapular winging when this occurs.  External rotation at the side is to 60 degrees bilaterally.  Internal rotation is to L1.  Positive Neer impingement bilaterally.  Remainder of distal neurosensory exam is intact   Imaging:   Xray (3 views left shoulder): Moderate AC joint osteoarthritis  MRI (left shoulder): Significant AC joint osteoarthritis consistent with subacromial impingement.  There is some intrasubstance tearing of the rotator cuff but no frank tear   I personally reviewed and interpreted the radiographs.   Assessment and Plan:   73 y.o. female with left shoulder pain consistent with rotator cuff tendinitis in the setting of subacromial impingement and AC joint arthritis.  At this time I did discuss that this is somewhat of a mechanical phenomenon but I do believe that ultimately her scapular winging may be as result of this impingement in which she is trying to work through.  Overall I do believe that she would benefit from a subacromial debridement as well as distal  clavicle resection.  I did discuss that I would also like her to participate in physical therapy postoperatively in order to continue to work on strengthening of her posterior scapular muscles.  -Plan for left shoulder arthroscopy with acromioplasty,  distal clavicle resection   After a lengthy discussion of treatment options, including risks, benefits, alternatives, complications of surgical and nonsurgical conservative options, the patient elected surgical repair.   The patient  is aware of the material risks  and complications including, but not limited to injury to adjacent structures, neurovascular injury, infection, numbness, bleeding, implant failure, thermal burns, stiffness, persistent pain, failure to heal, disease transmission from allograft, need for further surgery, dislocation, anesthetic risks, blood clots, risks of death,and others. The probabilities of surgical success and failure discussed with patient given their particular co-morbidities.The time and nature of expected rehabilitation and recovery was discussed.The patient's questions were all answered preoperatively.  No barriers to understanding were noted. I explained the natural history of the disease process and Rx rationale.  I explained to the patient what I considered to be reasonable expectations given their personal situation.  The final treatment plan was arrived at through a shared patient decision making process model.    I personally saw and evaluated the patient, and participated in the management and treatment plan.  Elspeth Parker, MD Attending Physician, Orthopedic Surgery  This document was dictated using Dragon voice recognition software. A reasonable attempt at proof reading has been made to minimize errors.

## 2023-11-21 NOTE — Telephone Encounter (Signed)
*  STAT* If patient is at the pharmacy, call can be transferred to refill team.   1. Which medications need to be refilled? (please list name of each medication and dose if known)   rosuvastatin  (CRESTOR ) 10 MG tablet   2. Would you like to learn more about the convenience, safety, & potential cost savings by using the [facility]?   3. Are you open to using the [facility] (Type [facility]. ).  4. Which pharmacy/location (including street and city if local pharmacy) is medication to be sent to?  [facility] - [place], [place] - [address]   5. Do they need a 30 day or 90 day supply?  90 day  Patient stated she has 1 tablet left.

## 2023-11-21 NOTE — H&P (View-Only) (Signed)
 Chief Complaint: Left shoulder pain     History of Present Illness:    Brianna Gates is a 73 y.o. female right-hand-dominant female presents with ongoing left shoulder pain particular with overhead activity.  She does state that she was told that she did have a partial tear 12 years ago with [facility].  She is very active and enjoys multiple fitness classes but has not been able to perform overhead ADLs or even go through full motion in the pool.  She does enjoy water  aerobics class.  She has been experiencing pain as well posterior to the scapula.  She has previously had physical therapy as well as an injection.  She is here today for further discussion as a referral from Dr. Joane    PMH/PSH/Family History/Social History/Meds/Allergies:    Past Medical History:  Diagnosis Date   Allergy    Anxiety    Asthma    INFREQUENT PROBLEM - rarely used inhaler   CAD (coronary artery disease) 06/29/2023   CCTA 06/27/23: CAC score 15.7 (45th percentile), mild nonobstructive CAD [mid LAD 1-24, proximal LCx 1-24]    Cervical spondylosis    Cervical strain 07/22/2014   Chest pain of uncertain etiology 12/13/2021   Complication of anesthesia    UNKNOWN REACTION WITH RASH AT SITE OF IV only with knee surgery   Degenerative arthritis    Dyslipidemia    Eczema    GERD (gastroesophageal reflux disease)    Guillain-Barre syndrome (HCC)    History of Guillain-Barre syndrome 07/22/2014   Hyperlipidemia    Hypertension    Hypothyroidism    Lumbosacral spondylosis    Palpitations 12/12/2021   Pure hypercholesterolemia 12/13/2021   Raynaud disease    Thyroid  nodule    Past Surgical History:  Procedure Laterality Date   ABDOMINAL HYSTERECTOMY     COLONOSCOPY     ESOPHAGOGASTRODUODENOSCOPY (EGD) WITH PROPOFOL  N/A 09/13/2021   Procedure: ESOPHAGOGASTRODUODENOSCOPY (EGD) WITH PROPOFOL ;  Surgeon: Abran Norleen SAILOR, MD;  Location: [facility];  Service: Endoscopy;  Laterality: N/A;    FOOT SURGERY Left    GANGLION CYST EXCISION Left    Wrist   HERNIA REPAIR     KNEE ARTHROSCOPY     RT KNEE   KNEE ARTHROTOMY Right 08/27/2013   Procedure: RIGHT KNEE SCAR EXCISION AND FEMORAL REVISION;  Surgeon: Dempsey LULLA Moan, MD;  Location: [facility];  Service: Orthopedics;  Laterality: Right;   REPLACEMENT TOTAL KNEE Right    2014, 2021   REPLACEMENT TOTAL KNEE BILATERAL Bilateral    STRABISMUS SURGERY Right    TONSILLECTOMY     vaginal vulvo prolapse  09/16/2015   Social History   Socioeconomic History   Marital status: Married    Spouse name: Not on file   Number of children: 2   Years of education: college   Highest education level: Not on file  Occupational History    Employer: UNEMPLOYED  Tobacco Use   Smoking status: Never   Smokeless tobacco: Never  Vaping Use   Vaping status: Never Used  Substance and Sexual Activity   Alcohol  use: Not Currently    Comment: stopped drinking   Drug use: No   Sexual activity: Yes    Birth control/protection: Other-see comments, Post-menopausal    Comment: Hysterectomy  Other Topics Concern   Not on file  Social History Narrative   Lives at home, married   Patient is right handed.   Patient drinks 2-3 cups caffeine daily.   Social  Drivers of Corporate Investment Banker Strain: Not on file  Food Insecurity: Not on file  Transportation Needs: Not on file  Physical Activity: Not on file  Stress: Not on file  Social Connections: Not on file   Family History  Problem Relation Age of Onset   Hypertension Mother    Other Mother        Dyslipidemia   Stroke Mother    Heart attack Father        1s   Heart disease Father    Hemochromatosis Father    Hemochromatosis Brother    Hyperlipidemia Brother    Hypertension Brother    Other Brother        Dyslipidemia   Stomach cancer Paternal Uncle    Colon cancer Neg Hx    Rectal cancer Neg Hx    Esophageal cancer Neg Hx    Allergies  Allergen Reactions   Captopril  Other (See Comments)    reaction   Oxaprozin Hives and Other (See Comments)   Ramipril Cough and Other (See Comments)   Zoloft [Sertraline Hcl] Diarrhea   Ace Inhibitors     Other reaction(s): Other (See Comments) GB syndrome   Doxycycline Other (See Comments)    Burning sensation/Rash   Influenza Vac Split Quad     Other Reaction(s): Guillain-Barr syndrome   Influenza Vaccines     HX    Sulfa Antibiotics Other (See Comments)    unknown   Amlodipine  Besylate Palpitations   Current Outpatient Medications  Medication Sig Dispense Refill   acetaminophen  (TYLENOL ) 500 MG tablet Take 1 tablet (500 mg total) by mouth every 8 (eight) hours for 10 days. 30 tablet 0   aspirin  EC 325 MG tablet Take 1 tablet (325 mg total) by mouth daily. 14 tablet 0   oxyCODONE  (ROXICODONE ) 5 MG immediate release tablet Take 1 tablet (5 mg total) by mouth every 4 (four) hours as needed for severe pain (pain score 7-10) or breakthrough pain. 30 tablet 0   acetaminophen  (TYLENOL ) 650 MG CR tablet Take 1,300 mg by mouth every 8 (eight) hours as needed for pain or fever.     albuterol  (PROVENTIL  HFA;VENTOLIN  HFA) 108 (90 BASE) MCG/ACT inhaler Inhale 2 puffs into the lungs every 6 (six) hours as needed for wheezing or shortness of breath.     amLODipine  (NORVASC ) 2.5 MG tablet Take 1 tablet (2.5 mg total) by mouth every evening. 90 tablet 3   amoxicillin (AMOXIL) 500 MG capsule Take 2,000 mg by mouth once as needed (prior to dental appointments).     Ascorbic Acid (VITAMIN C PO) Take 1 tablet by mouth as needed (sick symptoms).     aspirin  EC 81 MG tablet Take 81 mg by mouth daily. Swallow whole.     azelastine (ASTELIN) 0.1 % nasal spray Place 1 spray into both nostrils 2 (two) times daily as needed (cold symptoms).     busPIRone (BUSPAR) 30 MG tablet Take 30 mg by mouth 2 (two) times daily.     Cholecalciferol (VITAMIN D ) 2000 UNITS CAPS Take 2,000 Units by mouth daily in the afternoon.     COVID-19 mRNA vaccine  2023-2024 (COMIRNATY ) SUSP injection Inject into the muscle. 0.3 mL 0   COVID-19 mRNA vaccine, Pfizer, (COMIRNATY ) syringe Inject into the muscle. 0.3 mL 0   Emollient (CETAPHIL) cream Apply 1 application topically daily.     EPINEPHrine  0.3 mg/0.3 mL IJ SOAJ injection Inject 0.3 mg into the muscle once as needed  for anaphylaxis.     estradiol  (ESTRACE ) 0.1 MG/GM vaginal cream Place 1 Applicatorful vaginally 2 (two) times a week.     fexofenadine-pseudoephedrine (ALLEGRA-D 24) 180-240 MG 24 hr tablet Take 1 tablet by mouth daily.     gabapentin  (NEURONTIN ) 400 MG capsule Take 1 capsule (400 mg total) by mouth 3 (three) times daily. 90 capsule 5   guaiFENesin (MUCINEX) 600 MG 12 hr tablet Take 1,200 mg by mouth daily as needed.     ibuprofen (ADVIL,MOTRIN) 100 MG tablet Take 400 mg by mouth daily at 12 noon. May take in the pm additionally as needed     ketoconazole  (NIZORAL ) 2 % cream Apply 1 application topically daily. (Patient taking differently: Apply 1 application  topically as needed.) 60 g 0   levothyroxine  (SYNTHROID ) 50 MCG tablet Take 50 mcg by mouth daily before breakfast.      losartan  (COZAAR ) 50 MG tablet Take 1 tablet (50 mg total) by mouth in the morning. 90 tablet 3   montelukast  (SINGULAIR ) 10 MG tablet Take 10 mg by mouth at bedtime.     pantoprazole  (PROTONIX ) 40 MG tablet Take 1 tablet (40 mg total) by mouth 2 (two) times daily. Office visit for further refills (Patient taking differently: Take 40 mg by mouth as needed. Office visit for further refills) 180 tablet 0   rosuvastatin  (CRESTOR ) 10 MG tablet Take 1 tablet (10 mg total) by mouth every other day. 90 tablet 3   Simethicone (PHAZYME PO) Take 250 mg by mouth daily as needed (Gas).     triamcinolone  cream (KENALOG ) 0.1 % 1 application as needed (itching bumps on stomach). Mixed with Cetaphil     VEVYE 0.1 % SOLN Apply 1 drop to eye 2 (two) times daily.     No current facility-administered medications for this visit.    No results found.  Review of Systems:   A ROS was performed including pertinent positives and negatives as documented in the HPI.  Physical Exam :   Constitutional: NAD and appears stated age Neurological: Alert and oriented Psych: Appropriate affect and cooperative There were no vitals taken for this visit.   Comprehensive Musculoskeletal Exam:    Left shoulder with tenderness about the anterior superior deltoid as well as the posterior medial border of the scapula.  When the arm is in approximately 140 degrees of flexion she does have pain with weakness in resisted flexion.  Otherwise she is able to actively forward flex to approximately 170 degrees.  There is scapular winging when this occurs.  External rotation at the side is to 60 degrees bilaterally.  Internal rotation is to L1.  Positive Neer impingement bilaterally.  Remainder of distal neurosensory exam is intact   Imaging:   Xray (3 views left shoulder): Moderate AC joint osteoarthritis  MRI (left shoulder): Significant AC joint osteoarthritis consistent with subacromial impingement.  There is some intrasubstance tearing of the rotator cuff but no frank tear   I personally reviewed and interpreted the radiographs.   Assessment and Plan:   73 y.o. female with left shoulder pain consistent with rotator cuff tendinitis in the setting of subacromial impingement and AC joint arthritis.  At this time I did discuss that this is somewhat of a mechanical phenomenon but I do believe that ultimately her scapular winging may be as result of this impingement in which she is trying to work through.  Overall I do believe that she would benefit from a subacromial debridement as well as distal  clavicle resection.  I did discuss that I would also like her to participate in physical therapy postoperatively in order to continue to work on strengthening of her posterior scapular muscles.  -Plan for left shoulder arthroscopy with acromioplasty,  distal clavicle resection   After a lengthy discussion of treatment options, including risks, benefits, alternatives, complications of surgical and nonsurgical conservative options, the patient elected surgical repair.   The patient  is aware of the material risks  and complications including, but not limited to injury to adjacent structures, neurovascular injury, infection, numbness, bleeding, implant failure, thermal burns, stiffness, persistent pain, failure to heal, disease transmission from allograft, need for further surgery, dislocation, anesthetic risks, blood clots, risks of death,and others. The probabilities of surgical success and failure discussed with patient given their particular co-morbidities.The time and nature of expected rehabilitation and recovery was discussed.The patient's questions were all answered preoperatively.  No barriers to understanding were noted. I explained the natural history of the disease process and Rx rationale.  I explained to the patient what I considered to be reasonable expectations given their personal situation.  The final treatment plan was arrived at through a shared patient decision making process model.    I personally saw and evaluated the patient, and participated in the management and treatment plan.  Elspeth Parker, MD Attending Physician, Orthopedic Surgery  This document was dictated using Dragon voice recognition software. A reasonable attempt at proof reading has been made to minimize errors.

## 2023-11-22 ENCOUNTER — Telehealth: Payer: Self-pay

## 2023-11-22 NOTE — Telephone Encounter (Signed)
   Pre-operative Risk Assessment    Patient Name: Brianna Gates  DOB: [birth year] MRN: [id number]   Date of last office visit: 11/12/23 Date of next office visit: Not scheduled   Request for Surgical Clearance    Procedure:   Left Shoulder Arthroscopy with Acromioplasty, Debridement and Distal Clavicle Resection  Date of Surgery:  Clearance TBD                                Surgeon:  No Listed Surgeon's Group or Practice Name:  [facility] Phone number:  [phone] Fax number:  [phone]   Type of Clearance Requested:   - Medical  - Pharmacy:  Hold Aspirin  x7 days prior   Type of Anesthesia:  General    Additional requests/questions:    [name]   11/22/2023, 12:48 PM

## 2023-11-23 NOTE — Telephone Encounter (Signed)
 Brianna Gates 73 year old female is requesting preoperative cardiac evaluation for left shoulder arthroscopy with acromioplasty.  Procedure has not yet been scheduled.  She was last seen in clinic on 11/12/2023.  Her blood pressure medications were adjusted at that time.  She did note occasional palpitations.  Her prior coronary CTA showed mild nonobstructive disease and she denied chest discomfort/pain.  Please comment on cardiac risk for upcoming surgery.  May her aspirin  be held prior to her procedure?   Thank you for your help.  Please direct your response to CV [facility] preop pool.  Josefa HERO. Octavia Mottola NP-C     11/23/2023, 11:29 AM [facility] [address] Office [phone] Fax [phone]

## 2023-11-23 NOTE — Telephone Encounter (Signed)
 Pt just seen in clinic a couple weeks ago   From a cardiac standpoint she is at low risk for planned shoulder procedure

## 2023-11-23 NOTE — Telephone Encounter (Signed)
     Primary Cardiologist: Annabella Scarce, MD  Chart reviewed as part of pre-operative protocol coverage. Given past medical history and time since last visit, based on ACC/AHA guidelines, Brianna Gates would be at acceptable risk for the planned procedure without further cardiovascular testing.   From a cardiac standpoint she is at low risk for planned shoulder procedure   Patient's aspirin  is not prescribed by cardiology.  Recommendations for holding aspirin  will need to come from prescribing provider.  I will route this recommendation to the requesting party via Epic fax function and remove from pre-op pool.  Please call with questions.  Josefa HERO. Christropher Gintz NP-C     11/23/2023, 2:25 PM [facility] [address] Office [phone] Fax [phone]

## 2023-11-27 ENCOUNTER — Telehealth: Payer: Self-pay | Admitting: Internal Medicine

## 2023-11-27 DIAGNOSIS — J3081 Allergic rhinitis due to animal (cat) (dog) hair and dander: Secondary | ICD-10-CM | POA: Diagnosis not present

## 2023-11-27 DIAGNOSIS — J3089 Other allergic rhinitis: Secondary | ICD-10-CM | POA: Diagnosis not present

## 2023-11-27 DIAGNOSIS — J301 Allergic rhinitis due to pollen: Secondary | ICD-10-CM | POA: Diagnosis not present

## 2023-11-27 NOTE — Telephone Encounter (Signed)
I will re-fax clearance notes from Edd Fabian, FNP from 11/22/23. See notes in regard to ASA hold.

## 2023-11-27 NOTE — Telephone Encounter (Signed)
See previous clearance encounter. Patient is following up. She states yesterday afternoon she spoke with requesting office and they hadn't received clearance recommendation. She would like to know if it can be sent again and she provided a fall number, [phone] (attn: Morrie Sheldon).

## 2023-12-03 ENCOUNTER — Encounter: Payer: Self-pay | Admitting: Podiatry

## 2023-12-03 ENCOUNTER — Ambulatory Visit: Payer: Medicare HMO | Admitting: Podiatry

## 2023-12-03 DIAGNOSIS — M79674 Pain in right toe(s): Secondary | ICD-10-CM

## 2023-12-03 DIAGNOSIS — B351 Tinea unguium: Secondary | ICD-10-CM | POA: Diagnosis not present

## 2023-12-03 DIAGNOSIS — M79675 Pain in left toe(s): Secondary | ICD-10-CM

## 2023-12-03 NOTE — Progress Notes (Signed)
 Subjective: Chief Complaint  Patient presents with   Nail Problem    RM#11 Bilateral big toe ingrown nails.     73 year old female presents the office today for follow-up evaluation of ingrown toenail on both big toes.  States that she did try to trim them and she has been soaking Epsom salts.  Denies any drainage or pus.  She also gets similar symptoms of the left fourth toe.  No open lesion that she reports.  She has a skin tag previously that she tore off on the left foot this is since healed.  Objective: AAO x3, NAD DP/PT pulses palpable bilaterally, CRT less than 3 seconds Ingrown toenails noted along the distal aspect of bilateral hallux toenails on both medial, lateral nail borders.  This is also present on the left fourth toe.  There is no drainage or pus and there is no edema, erythema or signs of infection.  There are no open lesions noted today.  Looks that there is some slight dried blood present in the corner of the nail but no active bleeding or any signs of infection today.  The nails are dystrophic, hypertrophic with yellow, brown discoloration. No pain with calf compression, swelling, warmth, erythema  Assessment: Ingrown toenail  Plan: -All treatment options discussed with the patient including all alternatives, risks, complications.  -I did sharply debride the corners of the ingrown toenail without any complications or bleeding today x 3.  -No skin tag noted today no open lesions but continue to monitor any recurrence.  Vivi Barrack DPM

## 2023-12-05 DIAGNOSIS — M25512 Pain in left shoulder: Secondary | ICD-10-CM | POA: Diagnosis not present

## 2023-12-05 DIAGNOSIS — E785 Hyperlipidemia, unspecified: Secondary | ICD-10-CM | POA: Diagnosis not present

## 2023-12-05 DIAGNOSIS — Z01818 Encounter for other preprocedural examination: Secondary | ICD-10-CM | POA: Diagnosis not present

## 2023-12-05 DIAGNOSIS — R002 Palpitations: Secondary | ICD-10-CM | POA: Diagnosis not present

## 2023-12-06 DIAGNOSIS — F411 Generalized anxiety disorder: Secondary | ICD-10-CM | POA: Diagnosis not present

## 2023-12-10 ENCOUNTER — Telehealth: Payer: Self-pay | Admitting: Neurology

## 2023-12-10 ENCOUNTER — Encounter (HOSPITAL_BASED_OUTPATIENT_CLINIC_OR_DEPARTMENT_OTHER): Payer: Self-pay | Admitting: Orthopaedic Surgery

## 2023-12-10 NOTE — Telephone Encounter (Signed)
 Pt rescheduled Nerve Conduction due to having shoulder surgery

## 2023-12-11 DIAGNOSIS — J3089 Other allergic rhinitis: Secondary | ICD-10-CM | POA: Diagnosis not present

## 2023-12-11 DIAGNOSIS — J301 Allergic rhinitis due to pollen: Secondary | ICD-10-CM | POA: Diagnosis not present

## 2023-12-11 DIAGNOSIS — J3081 Allergic rhinitis due to animal (cat) (dog) hair and dander: Secondary | ICD-10-CM | POA: Diagnosis not present

## 2023-12-12 NOTE — Progress Notes (Signed)

## 2023-12-17 DIAGNOSIS — Z681 Body mass index (BMI) 19 or less, adult: Secondary | ICD-10-CM | POA: Diagnosis not present

## 2023-12-17 DIAGNOSIS — I7300 Raynaud's syndrome without gangrene: Secondary | ICD-10-CM | POA: Diagnosis not present

## 2023-12-17 DIAGNOSIS — M1120 Other chondrocalcinosis, unspecified site: Secondary | ICD-10-CM | POA: Diagnosis not present

## 2023-12-17 DIAGNOSIS — M3501 Sicca syndrome with keratoconjunctivitis: Secondary | ICD-10-CM | POA: Diagnosis not present

## 2023-12-17 DIAGNOSIS — M1991 Primary osteoarthritis, unspecified site: Secondary | ICD-10-CM | POA: Diagnosis not present

## 2023-12-17 DIAGNOSIS — M25561 Pain in right knee: Secondary | ICD-10-CM | POA: Diagnosis not present

## 2023-12-17 NOTE — Anesthesia Preprocedure Evaluation (Signed)
 Anesthesia Evaluation  Patient identified by MRN, date of birth, ID band Patient awake    Reviewed: Allergy & Precautions, NPO status , Patient's Chart, lab work & pertinent test results  History of Anesthesia Complications Negative for: history of anesthetic complications  Airway Mallampati: III  TM Distance: >3 FB Neck ROM: Full    Dental  (+) Dental Advisory Given   Pulmonary neg shortness of breath, asthma (has not used inhaler in months) , sleep apnea (uses a mouth guard) , neg COPD, neg recent URI   Pulmonary exam normal breath sounds clear to auscultation       Cardiovascular hypertension (amlodipine, losartan), Pt. on medications (-) angina + CAD (nonobstructive)  (-) Past MI, (-) Cardiac Stents and (-) CABG + dysrhythmias (palpitations)  Rhythm:Regular Rate:Normal  HLD  TTE 07/09/2023: IMPRESSIONS    1. Left ventricular ejection fraction, by estimation, is 60 to 65%. The  left ventricle has normal function. The left ventricle has no regional  wall motion abnormalities. Left ventricular diastolic parameters are  consistent with Grade I diastolic  dysfunction (impaired relaxation). The average left ventricular global  longitudinal strain is -19.3 %. The global longitudinal strain is normal.   2. Right ventricular systolic function is normal. The right ventricular  size is normal.   3. The mitral valve is normal in structure. Trivial mitral valve  regurgitation. No evidence of mitral stenosis.   4. The aortic valve is tricuspid. Aortic valve regurgitation is trivial.  No aortic stenosis is present.   5. The inferior vena cava is normal in size with greater than 50%  respiratory variability, suggesting right atrial pressure of 3 mmHg.     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  Neuromuscular  disease (h/o GBS)    GI/Hepatic Neg liver ROS,GERD  Medicated,,  Endo/Other  neg diabetesHypothyroidism    Renal/GU negative Renal ROS     Musculoskeletal  (+) Arthritis ,    Abdominal   Peds  Hematology negative hematology ROS (+) Lab Results      Component                Value               Date                      WBC                      5.4                 06/20/2023                HGB                      14.3                06/20/2023                HCT                      44.4                06/20/2023                MCV  99 (H)              06/20/2023                PLT                      198                 06/20/2023              Anesthesia Other Findings   Reproductive/Obstetrics                             Anesthesia Physical Anesthesia Plan  ASA: 2  Anesthesia Plan: General   Post-op Pain Management: Regional block* and Tylenol PO (pre-op)*   Induction: Intravenous  PONV Risk Score and Plan: 3 and Ondansetron, Dexamethasone and Treatment may vary due to age or medical condition  Airway Management Planned: Oral ETT  Additional Equipment:   Intra-op Plan:   Post-operative Plan: Extubation in OR  Informed Consent: I have reviewed the patients History and Physical, chart, labs and discussed the procedure including the risks, benefits and alternatives for the proposed anesthesia with the patient or authorized representative who has indicated his/her understanding and acceptance.     Dental advisory given  Plan Discussed with: CRNA and Anesthesiologist  Anesthesia Plan Comments: (Discussed potential risks of nerve blocks including, but not limited to, infection, bleeding, nerve damage, seizures, pneumothorax, respiratory depression, and potential failure of the block. Alternatives to nerve blocks discussed. All questions answered.  Risks of general anesthesia discussed including, but not limited to,  sore throat, hoarse voice, chipped/damaged teeth, injury to vocal cords, nausea and vomiting, allergic reactions, lung infection, heart attack, stroke, and death. All questions answered. )       Anesthesia Quick Evaluation

## 2023-12-18 ENCOUNTER — Encounter: Payer: Medicare HMO | Admitting: Neurology

## 2023-12-18 ENCOUNTER — Encounter (HOSPITAL_BASED_OUTPATIENT_CLINIC_OR_DEPARTMENT_OTHER)
Admission: RE | Disposition: A | Discharge status: Home / Self Care | Payer: Self-pay | Source: Home / Self Care | Attending: Orthopaedic Surgery

## 2023-12-18 ENCOUNTER — Other Ambulatory Visit: Payer: Self-pay

## 2023-12-18 ENCOUNTER — Ambulatory Visit (HOSPITAL_BASED_OUTPATIENT_CLINIC_OR_DEPARTMENT_OTHER): Payer: Self-pay | Admitting: Anesthesiology

## 2023-12-18 ENCOUNTER — Ambulatory Visit (HOSPITAL_BASED_OUTPATIENT_CLINIC_OR_DEPARTMENT_OTHER)
Admission: RE | Admit: 2023-12-18 | Discharge: 2023-12-18 | Disposition: A | Discharge status: Home / Self Care | Payer: Medicare HMO | Attending: Orthopaedic Surgery | Admitting: Orthopaedic Surgery

## 2023-12-18 ENCOUNTER — Encounter (HOSPITAL_BASED_OUTPATIENT_CLINIC_OR_DEPARTMENT_OTHER): Payer: Self-pay | Admitting: Orthopaedic Surgery

## 2023-12-18 DIAGNOSIS — M19012 Primary osteoarthritis, left shoulder: Secondary | ICD-10-CM

## 2023-12-18 DIAGNOSIS — I10 Essential (primary) hypertension: Secondary | ICD-10-CM | POA: Insufficient documentation

## 2023-12-18 DIAGNOSIS — Z01818 Encounter for other preprocedural examination: Secondary | ICD-10-CM

## 2023-12-18 DIAGNOSIS — X58XXXA Exposure to other specified factors, initial encounter: Secondary | ICD-10-CM | POA: Diagnosis not present

## 2023-12-18 DIAGNOSIS — M75102 Unspecified rotator cuff tear or rupture of left shoulder, not specified as traumatic: Secondary | ICD-10-CM

## 2023-12-18 DIAGNOSIS — G4730 Sleep apnea, unspecified: Secondary | ICD-10-CM | POA: Diagnosis not present

## 2023-12-18 DIAGNOSIS — M7542 Impingement syndrome of left shoulder: Secondary | ICD-10-CM | POA: Diagnosis not present

## 2023-12-18 DIAGNOSIS — G8918 Other acute postprocedural pain: Secondary | ICD-10-CM | POA: Diagnosis not present

## 2023-12-18 DIAGNOSIS — M25812 Other specified joint disorders, left shoulder: Secondary | ICD-10-CM | POA: Diagnosis present

## 2023-12-18 DIAGNOSIS — S43432A Superior glenoid labrum lesion of left shoulder, initial encounter: Secondary | ICD-10-CM | POA: Diagnosis not present

## 2023-12-18 DIAGNOSIS — K219 Gastro-esophageal reflux disease without esophagitis: Secondary | ICD-10-CM | POA: Diagnosis not present

## 2023-12-18 DIAGNOSIS — J45909 Unspecified asthma, uncomplicated: Secondary | ICD-10-CM | POA: Insufficient documentation

## 2023-12-18 DIAGNOSIS — M7582 Other shoulder lesions, left shoulder: Secondary | ICD-10-CM | POA: Diagnosis not present

## 2023-12-18 SURGERY — SHOULDER ARTHROSCOPY WITH DISTAL CLAVICLE RESECTION
Anesthesia: General | Site: Shoulder | Laterality: Left

## 2023-12-18 MED ORDER — FENTANYL CITRATE (PF) 100 MCG/2ML IJ SOLN
50.0000 ug | Freq: Once | INTRAMUSCULAR | Status: AC
  Administered 2023-12-18: 50 ug via INTRAVENOUS

## 2023-12-18 MED ORDER — PROPOFOL 10 MG/ML IV BOLUS
INTRAVENOUS | Status: DC | PRN
Start: 2023-12-18 — End: 2023-12-18
  Administered 2023-12-18: 100 mg via INTRAVENOUS

## 2023-12-18 MED ORDER — PROPOFOL 10 MG/ML IV BOLUS
INTRAVENOUS | Status: AC
  Filled 2023-12-18: qty 20

## 2023-12-18 MED ORDER — CEFAZOLIN SODIUM-DEXTROSE 2-4 GM/100ML-% IV SOLN
2.0000 g | INTRAVENOUS | Status: AC
Start: 2023-12-18 — End: 2023-12-18
  Administered 2023-12-18: 2 g via INTRAVENOUS

## 2023-12-18 MED ORDER — BUPIVACAINE LIPOSOME 1.3 % IJ SUSP
INTRAMUSCULAR | Status: DC | PRN
  Administered 2023-12-18: 10 mL via PERINEURAL

## 2023-12-18 MED ORDER — GABAPENTIN 300 MG PO CAPS: 300.0000 mg | ORAL_CAPSULE | Freq: Once | ORAL | Status: DC

## 2023-12-18 MED ORDER — FENTANYL CITRATE (PF) 100 MCG/2ML IJ SOLN
INTRAMUSCULAR | Status: AC
  Filled 2023-12-18: qty 2

## 2023-12-18 MED ORDER — CEFAZOLIN SODIUM-DEXTROSE 2-4 GM/100ML-% IV SOLN
INTRAVENOUS | Status: AC
  Filled 2023-12-18: qty 100

## 2023-12-18 MED ORDER — ONDANSETRON HCL 4 MG/2ML IJ SOLN
INTRAMUSCULAR | Status: AC
  Filled 2023-12-18: qty 2

## 2023-12-18 MED ORDER — BUPIVACAINE HCL (PF) 0.5 % IJ SOLN
INTRAMUSCULAR | Status: DC | PRN
  Administered 2023-12-18: 10 mL via PERINEURAL

## 2023-12-18 MED ORDER — DEXAMETHASONE SODIUM PHOSPHATE 4 MG/ML IJ SOLN
INTRAMUSCULAR | Status: DC | PRN
  Administered 2023-12-18: 5 mg via INTRAVENOUS

## 2023-12-18 MED ORDER — GABAPENTIN 300 MG PO CAPS
ORAL_CAPSULE | ORAL | Status: AC
  Filled 2023-12-18: qty 1

## 2023-12-18 MED ORDER — LIDOCAINE 2% (20 MG/ML) 5 ML SYRINGE
INTRAMUSCULAR | Status: AC
  Filled 2023-12-18: qty 5

## 2023-12-18 MED ORDER — FENTANYL CITRATE (PF) 100 MCG/2ML IJ SOLN: 25.0000 ug | INTRAMUSCULAR | Status: DC | PRN

## 2023-12-18 MED ORDER — EPHEDRINE SULFATE (PRESSORS) 50 MG/ML IJ SOLN
INTRAMUSCULAR | Status: DC | PRN
  Administered 2023-12-18: 15 mg via INTRAVENOUS
  Administered 2023-12-18 (×2): 10 mg via INTRAVENOUS

## 2023-12-18 MED ORDER — ROCURONIUM BROMIDE 10 MG/ML (PF) SYRINGE
PREFILLED_SYRINGE | INTRAVENOUS | Status: AC
  Filled 2023-12-18: qty 10

## 2023-12-18 MED ORDER — PHENYLEPHRINE HCL-NACL 20-0.9 MG/250ML-% IV SOLN
INTRAVENOUS | Status: DC | PRN
  Administered 2023-12-18: 35 ug/min via INTRAVENOUS

## 2023-12-18 MED ORDER — SODIUM CHLORIDE 0.9 % IR SOLN
Status: DC | PRN
  Administered 2023-12-18: 6000 mL

## 2023-12-18 MED ORDER — OXYCODONE HCL 5 MG PO TABS: 5.0000 mg | ORAL_TABLET | Freq: Once | ORAL | Status: DC | PRN

## 2023-12-18 MED ORDER — AMISULPRIDE (ANTIEMETIC) 5 MG/2ML IV SOLN: 10.0000 mg | Freq: Once | INTRAVENOUS | Status: DC | PRN

## 2023-12-18 MED ORDER — DEXAMETHASONE SODIUM PHOSPHATE 10 MG/ML IJ SOLN
INTRAMUSCULAR | Status: AC
  Filled 2023-12-18: qty 1

## 2023-12-18 MED ORDER — TRANEXAMIC ACID-NACL 1000-0.7 MG/100ML-% IV SOLN
INTRAVENOUS | Status: AC
  Filled 2023-12-18: qty 100

## 2023-12-18 MED ORDER — OXYCODONE HCL 5 MG/5ML PO SOLN: 5.0000 mg | Freq: Once | ORAL | Status: DC | PRN

## 2023-12-18 MED ORDER — ACETAMINOPHEN 500 MG PO TABS
1000.0000 mg | ORAL_TABLET | Freq: Once | ORAL | Status: AC
  Administered 2023-12-18: 1000 mg via ORAL

## 2023-12-18 MED ORDER — ONDANSETRON HCL 4 MG/2ML IJ SOLN
INTRAMUSCULAR | Status: DC | PRN
  Administered 2023-12-18: 4 mg via INTRAVENOUS

## 2023-12-18 MED ORDER — ROCURONIUM BROMIDE 100 MG/10ML IV SOLN
INTRAVENOUS | Status: DC | PRN
  Administered 2023-12-18: 40 mg via INTRAVENOUS

## 2023-12-18 MED ORDER — BUPIVACAINE HCL (PF) 0.25 % IJ SOLN
INTRAMUSCULAR | Status: AC
  Filled 2023-12-18: qty 60

## 2023-12-18 MED ORDER — MIDAZOLAM HCL 2 MG/2ML IJ SOLN
INTRAMUSCULAR | Status: AC
  Filled 2023-12-18: qty 2

## 2023-12-18 MED ORDER — TRANEXAMIC ACID-NACL 1000-0.7 MG/100ML-% IV SOLN
1000.0000 mg | INTRAVENOUS | Status: AC
  Administered 2023-12-18: 1000 mg via INTRAVENOUS

## 2023-12-18 MED ORDER — LACTATED RINGERS IV SOLN: INTRAVENOUS | Status: DC

## 2023-12-18 MED ORDER — ACETAMINOPHEN 500 MG PO TABS
ORAL_TABLET | ORAL | Status: AC
  Filled 2023-12-18: qty 2

## 2023-12-18 MED ORDER — FENTANYL CITRATE (PF) 100 MCG/2ML IJ SOLN
INTRAMUSCULAR | Status: DC | PRN
Start: 2023-12-18 — End: 2023-12-18
  Administered 2023-12-18: 50 ug via INTRAVENOUS

## 2023-12-18 MED ORDER — FENTANYL CITRATE (PF) 100 MCG/2ML IJ SOLN
INTRAMUSCULAR | Status: AC
Start: 2023-12-18 — End: ?
  Filled 2023-12-18: qty 2

## 2023-12-18 SURGICAL SUPPLY — 48 items
BLADE EXCALIBUR 4.0X13 (MISCELLANEOUS) ×2 IMPLANT
BURR OVAL 8 FLU 4.0X13 (MISCELLANEOUS) ×2 IMPLANT
CANNULA 5.75X71 LONG (CANNULA) IMPLANT
CANNULA PASSPORT 5 (CANNULA) IMPLANT
CANNULA PASSPORT BUTTON 10-40 (CANNULA) IMPLANT
CANNULA TWIST IN 8.25X7CM (CANNULA) IMPLANT
CHLORAPREP W/TINT 26 (MISCELLANEOUS) ×4 IMPLANT
COOLER ICEMAN CLASSIC (MISCELLANEOUS) ×2 IMPLANT
DRAPE IMP U-DRAPE 54X76 (DRAPES) ×2 IMPLANT
DRAPE INCISE IOBAN 66X45 STRL (DRAPES) ×2 IMPLANT
DRAPE SHOULDER BEACH CHAIR (DRAPES) ×2 IMPLANT
DRAPE U-SHAPE 47X51 STRL (DRAPES) ×4 IMPLANT
DW OUTFLOW CASSETTE/TUBE SET (MISCELLANEOUS) ×2 IMPLANT
GAUZE PAD ABD 8X10 STRL (GAUZE/BANDAGES/DRESSINGS) ×2 IMPLANT
GAUZE SPONGE 4X4 12PLY STRL (GAUZE/BANDAGES/DRESSINGS) ×2 IMPLANT
GAUZE XEROFORM 1X8 LF (GAUZE/BANDAGES/DRESSINGS) ×2 IMPLANT
GLOVE BIO SURGEON STRL SZ 6 (GLOVE) ×2 IMPLANT
GLOVE BIO SURGEON STRL SZ7.5 (GLOVE) ×2 IMPLANT
GLOVE BIOGEL PI IND STRL 6.5 (GLOVE) ×2 IMPLANT
GLOVE BIOGEL PI IND STRL 8 (GLOVE) ×2 IMPLANT
GOWN STRL REUS W/ TWL LRG LVL3 (GOWN DISPOSABLE) ×4 IMPLANT
GOWN STRL REUS W/ TWL XL LVL3 (GOWN DISPOSABLE) ×2 IMPLANT
KIT STABILIZATION SHOULDER (MISCELLANEOUS) ×2 IMPLANT
KIT STR SPEAR 1.8 FBRTK DISP (KITS) IMPLANT
LASSO 90 CVE QUICKPAS (DISPOSABLE) IMPLANT
LASSO CRESCENT QUICKPASS (SUTURE) IMPLANT
MANIFOLD NEPTUNE II (INSTRUMENTS) ×2 IMPLANT
NDL HD SCORPION MEGA LOADER (NEEDLE) IMPLANT
PACK ARTHROSCOPY DSU (CUSTOM PROCEDURE TRAY) ×2 IMPLANT
PACK BASIN DAY SURGERY FS (CUSTOM PROCEDURE TRAY) ×2 IMPLANT
PAD COLD SHLDR WRAP-ON (PAD) ×2 IMPLANT
RESTRAINT HEAD UNIVERSAL NS (MISCELLANEOUS) ×2 IMPLANT
SHEET MEDIUM DRAPE 40X70 STRL (DRAPES) ×2 IMPLANT
SLEEVE SCD COMPRESS KNEE MED (STOCKING) ×2 IMPLANT
SUT ETHILON 3 0 PS 1 (SUTURE) ×2 IMPLANT
SUT FIBERWIRE #2 38 T-5 BLUE (SUTURE) IMPLANT
SUT PDS AB 1 CT 36 (SUTURE) IMPLANT
SUT TIGER TAPE 7 IN WHITE (SUTURE) IMPLANT
SUTURE FIBERWR #2 38 T-5 BLUE (SUTURE) IMPLANT
SUTURE TAPE 1.3 40 TPR END (SUTURE) IMPLANT
SUTURE TAPE TIGERLINK 1.3MM BL (SUTURE) IMPLANT
SUTURETAPE 1.3 40 TPR END (SUTURE) IMPLANT
SUTURETAPE TIGERLINK 1.3MM BL (SUTURE) IMPLANT
TAPE FIBER 2MM 7IN #2 BLUE (SUTURE) IMPLANT
TOWEL GREEN STERILE FF (TOWEL DISPOSABLE) ×4 IMPLANT
TUBE CONNECTING 20X1/4 (TUBING) ×2 IMPLANT
TUBING ARTHROSCOPY IRRIG 16FT (MISCELLANEOUS) ×2 IMPLANT
WAND ABLATOR APOLLO I90 (BUR) ×2 IMPLANT

## 2023-12-18 NOTE — Transfer of Care (Signed)
 Immediate Anesthesia Transfer of Care Note  Patient: Brianna Gates  Procedure(s) Performed: LEFT SHOULDER ARTHROSCOPY WITH EXTENSIVE DEBRIDEMENT, DISTAL CLAVICLE RESECTION / ACROMIOPLASTY (Left: Shoulder)  Patient Location: PACU  Anesthesia Type:General and Regional  Level of Consciousness: awake, alert , and oriented  Airway & Oxygen Therapy: Patient Spontanous Breathing and Patient connected to face mask oxygen  Post-op Assessment: Report given to RN and Post -op Vital signs reviewed and stable  Post vital signs: Reviewed and stable  Last Vitals:  Vitals Value Taken Time  BP 145/72 12/18/23 0823  Temp    Pulse 67 12/18/23 0826  Resp 12 12/18/23 0826  SpO2 99 % 12/18/23 0826  Vitals shown include unfiled device data.  Last Pain:  Vitals:   12/18/23 0636  TempSrc: Temporal  PainSc: 5       Patients Stated Pain Goal: 4 (12/18/23 0636)  Complications: No notable events documented.

## 2023-12-18 NOTE — Brief Op Note (Signed)
   Brief Op Note  Date of Surgery: 12/18/2023  Preoperative Diagnosis: LEFT ROTATOR CUFF IMPINGEMENT  Postoperative Diagnosis: same  Procedure: Procedure(s): LEFT SHOULDER ARTHROSCOPY WITH EXTENSIVE DEBRIDEMENT, DISTAL CLAVICLE RESECTION / ACROMIOPLASTY  Implants: * No implants in log *  Surgeons: Surgeon(s): Huel Cote, MD  Anesthesia: General    Estimated Blood Loss: See anesthesia record  Complications: None  Condition to PACU: Stable  Benancio Deeds, MD 12/18/2023 8:26 AM

## 2023-12-18 NOTE — Anesthesia Procedure Notes (Signed)
 Anesthesia Regional Block: Interscalene brachial plexus block   Pre-Anesthetic Checklist: , timeout performed,  Correct Patient, Correct Site, Correct Laterality,  Correct Procedure, Correct Position, site marked,  Risks and benefits discussed,  Surgical consent,  Pre-op evaluation,  At surgeon's request and post-op pain management  Laterality: Left  Prep: chloraprep       Needles:  Injection technique: Single-shot  Needle Type: Echogenic Stimulator Needle     Needle Length: 9cm  Needle Gauge: 21     Additional Needles:   Procedures:,,,, ultrasound used (permanent image in chart),,    Narrative:  Start time: 12/18/2023 7:07 AM End time: 12/18/2023 7:11 AM Injection made incrementally with aspirations every 5 mL.  Performed by: Personally  Anesthesiologist: Linton Rump, MD  Additional Notes: Discussed risks and benefits of nerve block including, but not limited to, prolonged and/or permanent nerve injury involving sensory and/or motor function. Monitors were applied and a time-out was performed. The nerve and associated structures were visualized under ultrasound guidance. After negative aspiration, local anesthetic was slowly injected around the nerve. There was no evidence of high pressure during the procedure. There were no paresthesias. VSS remained stable and the patient tolerated the procedure well.

## 2023-12-18 NOTE — Op Note (Signed)
 Date of Surgery: 12/18/2023  INDICATIONS: Brianna Gates is a 73 y.o.-year-old female with left shoulder rotator cuff impingement.  The risk and benefits of the procedure were discussed in detail and documented in the pre-operative evaluation.   PREOPERATIVE DIAGNOSES: Left shoulder, SLAP tear, AC arthritis, and subacromial impingement.  POSTOPERATIVE DIAGNOSIS: Same.  PROCEDURE: Arthroscopic extensive debridement - 29823 Supraspinatus Tendon, Anterior Labrum, Superior Labrum, and Posterior Labrum Arthroscopic distal clavicle excision - 16109 Arthroscopic subacromial decompression - 60454  SURGEON: Benancio Deeds MD  ASSISTANT: Kerby Less, ATC  ANESTHESIA:  general  IV FLUIDS AND URINE: See anesthesia record.  ANTIBIOTICS: Ancef  ESTIMATED BLOOD LOSS: 10 mL.  IMPLANTS:  * No implants in log *  DRAINS: None  CULTURES: None  COMPLICATIONS: none  PROCEDURE:    OPERATIVE FINDING: Exam under anesthesia:   Examination under anesthesia revealed forward elevation of 150 degrees.  With the arm at the side, there was 65 degrees of external rotation.  There is a 1+ anterior load shift and a 1+ posterior load shift.    Arthroscopic findings demonstrated: Articular space: Rotator interval normal Chondral surfaces: Normal Biceps: Normal Subscapularis: Intact Supraspinatus: Undersurface fraying without frank tear Infraspinatus: Intact    I identified the patient in the pre-operative holding area.  I marked the operative right shoulder with my initials. I reviewed the risks and benefits of the proposed surgical intervention and the patient wished to proceed.  Anesthesia was then performed with regional block.  The patient was transferred to the operative suite and placed in the beach chair position with all bony prominences padded.     SCDs were placed on bilateral lower extremity. Appropriate antibiotics was administered within 1 hour before incision.  Anesthesia was  induced.  The operative extremity was then prepped and draped in standard fashion. A time out was performed confirming the correct extremity, correct patient and correct procedure.   The arthroscope was introduced in the glenohumeral joint from a posterior portal.  An anterior portal was created.  The shoulder was examined and the above findings were noted.     With an arthroscopic shaver and a wand ablator, synovitis throughout the  shoulder was resected.  The arthroscopic shaver was used to excise torn portions of the labrum back to a stable margin. Specifically this was done for the anterior and superior labrum. The rotator interval was debrided using shaver and electrocautery.   Through visualization from intra-articular, the footprint of the rotator cuff was debrided of soft tissues back to bleeding bone.  This was done with an arthroscopic shaver.     The rotator cuff was then approached through the subacromial space. Anterior, lateral, and posterior portals were used.  Bursectomy was performed with an arthroscopic shaver and [employer] wand.  The soft tissues and 1 mm of bone on the undersurface of the acromion and clavicle were resected with the arthroscopic shaver and wand.  The shaver was introduced into the anterior portal and the distal aspect of the clavicle was then debrided with a shaver on forward mode.    The shoulder was irrigated.  The arthroscopic instruments were removed.  Wounds were closed with 3-0 nylon sutures.  A sterile dressing was applied with xeroform, 4x8s, abdominal pad, and tape. An Flonnie Hailstone was placed and the upper extremity was placed in a shoulder immobilizer.  The patient tolerated the procedure well and was taken to the recovery room in stable condition.  All counts were correct in the case. The patient tolerated  the procedure well and was taken to the recovery room in stable condition.    POSTOPERATIVE PLAN: She will be weightbearing and activity as tolerated once  her nerve block wears off.  She was placed on aspirin for blood clot prevention.  She will be seen in 2 weeks for suture removal  Benancio Deeds, MD 8:26 AM

## 2023-12-18 NOTE — Progress Notes (Signed)
Assisted Dr. Jennifer Allan with left, interscalene , ultrasound guided block. Side rails up, monitors on throughout procedure. See vital signs in flow sheet. Tolerated Procedure well. 

## 2023-12-18 NOTE — Interval H&P Note (Signed)
 History and Physical Interval Note:  12/18/2023 6:52 AM  Brianna Gates  has presented today for surgery, with the diagnosis of LEFT ROTATOR CUFF IMPINGEMENT.  The various methods of treatment have been discussed with the patient and family. After consideration of risks, benefits and other options for treatment, the patient has consented to  Procedure(s): LEFT SHOULDER ARTHROSCOPY WITH EXTENSIVE DEBRIDEMENT, DISTAL CLAVICLE RESECTION / ACROMIOPLASTY (Left) as a surgical intervention.  The patient's history has been reviewed, patient examined, no change in status, stable for surgery.  I have reviewed the patient's chart and labs.  Questions were answered to the patient's satisfaction.     Huel Cote

## 2023-12-18 NOTE — Discharge Instructions (Addendum)
 Discharge Instructions    Attending Surgeon: Huel Cote, MD Office Phone Number: [phone]   Diagnosis and Procedures:    Surgeries Performed: Left shoulder subacromial debridement  Discharge Plan:    Diet: Resume usual diet. Begin with light or bland foods.  Drink plenty of fluids.  Activity:  Keep sling and dressing in place until your follow up visit in Physical Therapy You are advised to go home directly from the hospital or surgical center. Restrict your activities.  GENERAL INSTRUCTIONS: 1.  Keep your surgical site elevated above your heart for at least 5-7 days or longer to prevent swelling. This will improve your comfort and your overall recovery following surgery.     2. Please call Dr. Serena Croissant office at [phone] with questions Monday-Friday during business hours. If no one answers, please leave a message and someone should get back to the patient within 24 hours. For emergencies please call 911 or proceed to the emergency room.   3. Patient to notify surgical team if experiences any of the following: Bowel/Bladder dysfunction, uncontrolled pain, nerve/muscle weakness, incision with increased drainage or redness, nausea/vomiting and Fever greater than 101.0 F.  Be alert for signs of infection including redness, streaking, odor, fever or chills. Be alert for excessive pain or bleeding and notify your surgeon immediately.  WOUND INSTRUCTIONS:   Leave your dressing/cast/splint in place until your post operative visit.  Keep it clean and dry.  Always keep the incision clean and dry until the staples/sutures are removed. If there is no drainage from the incision you should keep it open to air. If there is drainage from the incision you must keep it covered at all times until the drainage stops  Do not soak in a bath tub, hot tub, pool, lake or other body of water until 21 days after your surgery and your incision is completely dry and healed.  If you have  removable sutures (or staples) they must be removed 10-14 days (unless otherwise instructed) from the day of your surgery.     1)  Elevate the extremity as much as possible.  2)  Keep the dressing clean and dry.  3)  Please call us if the dressing becomes wet or dirty.  4)  If you are experiencing worsening pain or worsening swelling, please call.     MEDICATIONS: Resume all previous home medications at the previous prescribed dose and frequency unless otherwise noted Start taking the  pain medications on an as-needed basis as prescribed  Please taper down pain medication over the next week following surgery.  Ideally you should not require a refill of any narcotic pain medication.  Take pain medication with food to minimize nausea. In addition to the prescribed pain medication, you may take over-the-counter pain relievers such as Tylenol.  Do NOT take additional tylenol if your pain medication already has tylenol in it.  Aspirin 325mg  daily per bottle instructions. Narcotic Policy: Per [facility] clinic policy, our goal is ensure optimal postoperative pain control with a multimodal pain management strategy. For all [facility] patients, our goal is to wean post-operative narcotic medications by 6 weeks post-operatively, and many times sooner. If this is not possible due to utilization of pain medication prior to surgery, your [facility] doctor will support your acute post-operative pain control for the first 6 weeks postoperatively, with a plan to transition you back to your primary pain team following that. Cyndia Skeeters will work to ensure a Therapist, occupational.  FOLLOWUP INSTRUCTIONS: 1. Follow up at the Physical Therapy Clinic 3-4 days following surgery. This appointment should be scheduled unless other arrangements have been made.The Physical Therapy scheduling number is [phone] if an appointment has not already been arranged.  2. Contact Dr. Serena Croissant office during office hours at [phone] or the practice after hours line at [phone] for non-emergencies. For medical emergencies call 911.   Discharge Location: Home   Post Anesthesia Home Care Instructions  Activity: Get plenty of rest for the remainder of the day. A responsible individual must stay with you for 24 hours following the procedure.  For the next 24 hours, DO NOT: -Drive a car -Advertising copywriter -Drink alcoholic beverages -Take any medication unless instructed by your physician -Make any legal decisions or sign important papers.  Meals: Start with liquid foods such as gelatin or soup. Progress to regular foods as tolerated. Avoid greasy, spicy, heavy foods. If nausea and/or vomiting occur, drink only clear liquids until the nausea and/or vomiting subsides. Call your physician if vomiting continues.  Special Instructions/Symptoms: Your throat may feel dry or sore from the anesthesia or the breathing tube placed in your throat during surgery. If this causes discomfort, gargle with warm salt water. The discomfort should disappear within 24 hours.  If you had a scopolamine patch placed behind your ear for the management of post- operative nausea and/or vomiting:  1. The medication in the patch is effective for 72 hours, after which it should be removed.  Wrap patch in a tissue and discard in the trash. Wash hands thoroughly with soap and water. 2. You may remove the patch earlier than 72 hours if you experience unpleasant side effects which may include dry mouth, dizziness or visual disturbances. 3. Avoid touching the patch. Wash your hands with soap and water after contact with the patch.   Information for Discharge Teaching: EXPAREL (bupivacaine liposome injectable suspension)   Pain relief is important to your recovery. The goal is to control your pain so you can move easier and return to your normal activities as soon as possible after your procedure. Your physician may use several types of  medicines to manage pain, swelling, and more.  Your surgeon or anesthesiologist gave you EXPAREL(bupivacaine) to help control your pain after surgery.  EXPAREL is a local anesthetic designed to release slowly over an extended period of time to provide pain relief by numbing the tissue around the surgical site. EXPAREL is designed to release pain medication over time and can control pain for up to 72 hours. Depending on how you respond to EXPAREL, you may require less pain medication during your recovery. EXPAREL can help reduce or eliminate the need for opioids during the first few days after surgery when pain relief is needed the most. EXPAREL is not an opioid and is not addictive. It does not cause sleepiness or sedation.   Important! A teal colored band has been placed on your arm with the date, time and amount of EXPAREL you have received. Please leave this armband in place for the full 96 hours following administration, and then you may remove the band. If you return to the hospital for any reason within 96 hours following the administration of EXPAREL, the armband provides important information that your health care providers to know, and alerts them that you have received this anesthetic.    Possible side effects of EXPAREL: Temporary loss of sensation or ability to move in the area where medication was injected. Nausea, vomiting,  constipation Rarely, numbness and tingling in your mouth or lips, lightheadedness, or anxiety may occur. Call your doctor right away if you think you may be experiencing any of these sensations, or if you have other questions regarding possible side effects.  Follow all other discharge instructions given to you by your surgeon or nurse. Eat a healthy diet and drink plenty of water or other fluids.  Regional Anesthesia Blocks  1. You may not be able to move or feel the "blocked" extremity after a regional anesthetic block. This may last may last from 3-48 hours  after placement, but it will go away. The length of time depends on the medication injected and your individual response to the medication. As the nerves start to wake up, you may experience tingling as the movement and feeling returns to your extremity. If the numbness and inability to move your extremity has not gone away after 48 hours, please call your surgeon.   2. The extremity that is blocked will need to be protected until the numbness is gone and the strength has returned. Because you cannot feel it, you will need to take extra care to avoid injury. Because it may be weak, you may have difficulty moving it or using it. You may not know what position it is in without looking at it while the block is in effect.  3. For blocks in the legs and feet, returning to weight bearing and walking needs to be done carefully. You will need to wait until the numbness is entirely gone and the strength has returned. You should be able to move your leg and foot normally before you try and bear weight or walk. You will need someone to be with you when you first try to ensure you do not fall and possibly risk injury.  4. Bruising and tenderness at the needle site are common side effects and will resolve in a few days.  5. Persistent numbness or new problems with movement should be communicated to the surgeon or the [facility] [phone] [facility] [phone]).  *May have Tylenol today at 12:40pm

## 2023-12-18 NOTE — Anesthesia Postprocedure Evaluation (Signed)
 Anesthesia Post Note  Patient: Brianna Gates  Procedure(s) Performed: LEFT SHOULDER ARTHROSCOPY WITH EXTENSIVE DEBRIDEMENT, DISTAL CLAVICLE RESECTION / ACROMIOPLASTY (Left: Shoulder)     Patient location during evaluation: PACU Anesthesia Type: General Level of consciousness: awake Pain management: pain level controlled Vital Signs Assessment: post-procedure vital signs reviewed and stable Respiratory status: spontaneous breathing, nonlabored ventilation and respiratory function stable Cardiovascular status: blood pressure returned to baseline and stable Postop Assessment: no apparent nausea or vomiting Anesthetic complications: no   No notable events documented.  Last Vitals:  Vitals:   12/18/23 0900 12/18/23 0914  BP: 136/70 122/64  Pulse: 64 65  Resp: 19 20  Temp:  36.6 C  SpO2: 95% 96%    Last Pain:  Vitals:   12/18/23 0914  TempSrc: Temporal  PainSc: 0-No pain                 Linton Rump

## 2023-12-19 ENCOUNTER — Encounter (HOSPITAL_BASED_OUTPATIENT_CLINIC_OR_DEPARTMENT_OTHER): Payer: Self-pay | Admitting: Orthopaedic Surgery

## 2023-12-20 ENCOUNTER — Other Ambulatory Visit (HOSPITAL_BASED_OUTPATIENT_CLINIC_OR_DEPARTMENT_OTHER): Payer: Self-pay | Admitting: Orthopaedic Surgery

## 2023-12-20 DIAGNOSIS — M7582 Other shoulder lesions, left shoulder: Secondary | ICD-10-CM

## 2023-12-22 ENCOUNTER — Ambulatory Visit (HOSPITAL_BASED_OUTPATIENT_CLINIC_OR_DEPARTMENT_OTHER): Attending: Orthopaedic Surgery | Admitting: Physical Therapy

## 2023-12-22 ENCOUNTER — Other Ambulatory Visit: Payer: Self-pay

## 2023-12-22 DIAGNOSIS — R293 Abnormal posture: Secondary | ICD-10-CM | POA: Insufficient documentation

## 2023-12-22 DIAGNOSIS — M6281 Muscle weakness (generalized): Secondary | ICD-10-CM | POA: Diagnosis not present

## 2023-12-22 DIAGNOSIS — M7582 Other shoulder lesions, left shoulder: Secondary | ICD-10-CM | POA: Diagnosis present

## 2023-12-22 DIAGNOSIS — R252 Cramp and spasm: Secondary | ICD-10-CM

## 2023-12-22 DIAGNOSIS — M25512 Pain in left shoulder: Secondary | ICD-10-CM

## 2023-12-22 NOTE — Therapy (Signed)
 OUTPATIENT PHYSICAL THERAPY SHOULDER EVALUATION   Patient Name: CLESSIE KARRAS MRN: [id number] DOB:Sep 01, 1951, 73 y.o., female Today's Date: 12/22/2023  END OF SESSION:  PT End of Session - 12/22/23 1137     Visit Number 1    Date for PT Re-Evaluation 03/14/24    Authorization Type [employer]    Progress Note Due on Visit 10    PT Start Time 1045    PT Stop Time 1125    PT Time Calculation (min) 40 min    Activity Tolerance Patient tolerated treatment well    Behavior During Therapy [facility] for tasks assessed/performed             Past Medical History:  Diagnosis Date   Allergy    Anxiety    Asthma    INFREQUENT PROBLEM - rarely used inhaler   CAD (coronary artery disease) 06/29/2023   CCTA 06/27/23: CAC score 15.7 (45th percentile), mild nonobstructive CAD [mid LAD 1-24, proximal LCx 1-24]    Cervical spondylosis    Cervical strain 07/22/2014   Chest pain of uncertain etiology 12/13/2021   Degenerative arthritis    Dyslipidemia    Eczema    GERD (gastroesophageal reflux disease)    Guillain-Barre syndrome (HCC)    History of Guillain-Barre syndrome 07/22/2014   Hyperlipidemia    Hypertension    Hypothyroidism    Lumbosacral spondylosis    Palpitations 12/12/2021   Pure hypercholesterolemia 12/13/2021   Raynaud disease    Thyroid nodule    Past Surgical History:  Procedure Laterality Date   ABDOMINAL HYSTERECTOMY     COLONOSCOPY     ESOPHAGOGASTRODUODENOSCOPY (EGD) WITH PROPOFOL N/A 09/13/2021   Procedure: ESOPHAGOGASTRODUODENOSCOPY (EGD) WITH PROPOFOL;  Surgeon: Hilarie Fredrickson, MD;  Location: [facility];  Service: Endoscopy;  Laterality: N/A;   FOOT SURGERY Left    GANGLION CYST EXCISION Left    Wrist   HERNIA REPAIR     KNEE ARTHROSCOPY     RT KNEE   KNEE ARTHROTOMY Right 08/27/2013   Procedure: RIGHT KNEE SCAR EXCISION AND FEMORAL REVISION;  Surgeon: Loanne Drilling, MD;  Location: [facility];  Service: Orthopedics;  Laterality: Right;    REPLACEMENT TOTAL KNEE Right    2014, 2021   REPLACEMENT TOTAL KNEE BILATERAL Bilateral    SHOULDER ARTHROSCOPY WITH DISTAL CLAVICLE RESECTION Left 12/18/2023   Procedure: LEFT SHOULDER ARTHROSCOPY WITH EXTENSIVE DEBRIDEMENT, DISTAL CLAVICLE RESECTION / ACROMIOPLASTY;  Surgeon: Huel Cote, MD;  Location: [facility];  Service: Orthopedics;  Laterality: Left;   STRABISMUS SURGERY Right    TONSILLECTOMY     vaginal vulvo prolapse  09/16/2015   Patient Active Problem List   Diagnosis Date Noted   Tendinitis of left rotator cuff 12/18/2023   Arthritis of left acromioclavicular joint 12/18/2023   CAD (coronary artery disease) 06/29/2023   Paresthesia 10/18/2022   Onychomycosis 01/29/2022   Chest pain of uncertain etiology 12/13/2021   Pure hypercholesterolemia 12/13/2021   Palpitations 12/12/2021   Esophageal dysphagia    Allergic rhinitis 04/04/2021   Allergic rhinitis due to animal (cat) (dog) hair and dander 04/04/2021   Allergic rhinitis due to pollen 04/04/2021   Hormone deficiency 04/04/2021   Syncope and collapse 12/27/2020   Hypotension 12/27/2020   Bradycardia 12/27/2020   History of COVID-19 12/27/2020   Preoperative evaluation to rule out surgical contraindication 07/16/2020   Arthrofibrosis of knee joint, right 11/26/2019   Arthritis of knee 07/30/2019   Presence of right artificial knee joint 07/30/2019  Sleep apnea 10/16/2018   Shoulder impingement syndrome 01/15/2018   Gastroesophageal reflux disease 02/02/2016   Sensorineural hearing loss (SNHL), bilateral 02/02/2016   Vaginal vault prolapse after hysterectomy 07/28/2015   History of Guillain-Barre syndrome 07/22/2014   Cervical strain 07/22/2014   Postoperative stiffness of total knee replacement (HCC) 08/27/2013   Abnormality of gait 08/22/2012   Cervical spondylosis without myelopathy 08/22/2012   Muscle weakness (generalized) 08/22/2012   Guillain-Barre syndrome (HCC) 08/22/2012   Essential  hypertension 08/22/2012   Strabismus 08/22/2012   Degenerative joint disease of knee, left 08/22/2012   Hypothyroidism 08/22/2012   Raynaud's disease 08/22/2012   Knee joint replacement by other means 08/22/2012    PCP: Chilton Greathouse, MD  REFERRING PROVIDER: Huel Cote, MD  REFERRING DIAG: Tendinitis of left rotator cuff [M75.82]   THERAPY DIAG:  Acute pain of left shoulder - Plan: PT plan of care cert/re-cert  Abnormal posture - Plan: PT plan of care cert/re-cert  Cramp and spasm - Plan: PT plan of care cert/re-cert  Muscle weakness (generalized) - Plan: PT plan of care cert/re-cert  Rationale for Evaluation and Treatment: Rehabilitation  ONSET DATE: 12/18/2023  SUBJECTIVE:                                                                                                                                                                                      SUBJECTIVE STATEMENT: Pt presents to PT post op L shoulder arthroscopy with extensive debridement, distal clavicle resection/ acromioplasty. She reports some mild pain in her L shoulder since surgery but thinks it is due to the incisions rubbing against the dressing. She denies any issues with sleeping. She continues to have numbness in her fingers from her recent nerve block. Prior to sx, she was unable to reach [facility], out to the side, and had difficulty dressing her bed.   Hand dominance: Right  PERTINENT HISTORY: HTN, GERD, Gullian- Barre syndrome.   PAIN:  Are you having pain? Yes: NPRS scale: 5/10 Pain location: At surgical site.  Pain description: Achy pain.  Aggravating factors: Movement  Relieving factors: Rest, meds.   PRECAUTIONS: Shoulder  RED FLAGS: None   WEIGHT BEARING RESTRICTIONS: No  FALLS:  Has patient fallen in last 6 months? No  LIVING ENVIRONMENT: Lives with: lives with their spouse Lives in: House/apartment Stairs: Yes: Internal: 1 flight steps; on right going up and External: 4-5  steps; bilateral but cannot reach both Has following equipment at home: shower chair and Grab bars  OCCUPATION: Retired   PLOF: Independent  PATIENT GOALS: Pt would like to be able to get back to functional mobility and get back to working out.  NEXT MD VISIT: 12/26/2023  OBJECTIVE:  Note: Objective measures were completed at Evaluation unless otherwise noted.  DIAGNOSTIC FINDINGS:  1. Mild supraspinatus tendinosis with a tiny insertional interstitial tear. 2. Moderate infraspinatus tendinosis.    PATIENT SURVEYS:  Neldon Mc Next session.   COGNITION: Overall cognitive status: Within functional limits for tasks assessed     SENSATION: WFL  POSTURE: Pt is present in sling.   UPPER EXTREMITY ROM:   Passive ROM Right eval Left eval  Shoulder flexion    Shoulder extension    Shoulder abduction    Shoulder adduction    Shoulder internal rotation    Shoulder external rotation    Elbow flexion    Elbow extension    Wrist flexion    Wrist extension    Wrist ulnar deviation    Wrist radial deviation    Wrist pronation    Wrist supination    (Blank rows = not tested)  UPPER EXTREMITY MMT:  MMT Right eval Left eval  Shoulder flexion    Shoulder extension    Shoulder abduction    Shoulder adduction    Shoulder internal rotation    Shoulder external rotation    Middle trapezius    Lower trapezius    Elbow flexion    Elbow extension    Wrist flexion    Wrist extension    Wrist ulnar deviation    Wrist radial deviation    Wrist pronation    Wrist supination    Grip strength (lbs)    (Blank rows = not tested)  JOINT MOBILITY TESTING:  NT today  PALPATION:  No tenderness to surrounding areas.                                                                                                                              TREATMENT DATE: Creating, reviewing, and completing below HEP, re-dressed surgical site with InteguDerm.    PATIENT  EDUCATION: Education details: Educated pt on anatomy and physiology of current symptoms, Quick DASH, diagnosis, prognosis, HEP,  and POC. Person educated: Patient Education method: Medical illustrator Education comprehension: verbalized understanding and returned demonstration  HOME EXERCISE PROGRAM: Access Code: [email or web address] URL: [email or web address] Date: 12/22/2023 Prepared by: Royal Hawthorn  Exercises - Seated Elbow Flexion and Extension AROM  - 1 x daily - 7 x weekly - 3 sets - 10 reps - Putty Squeezes  - 1 x daily - 7 x weekly - 3 sets - 10 reps  ASSESSMENT:  CLINICAL IMPRESSION: Patient referred to PT for s/p L shoulder arthroscopy with extensive debridement, distal clavicle resection/ acromioplasty. Changed dressings today. Surgical site appears clean with no redness or major swelling noted. She denies any significant pain. Pt provided initial HEP. Patient will benefit from skilled PT to address below impairments, limitations and improve overall function.  OBJECTIVE IMPAIRMENTS: decreased activity tolerance, decreased shoulder mobility, decreased ROM, decreased strength, impaired flexibility, impaired UE use, postural dysfunction, and pain.  ACTIVITY LIMITATIONS: reaching, lifting, carry,  cleaning, driving, and or occupation  PERSONAL FACTORS:  also affecting patient's functional outcome.  REHAB POTENTIAL: Good  CLINICAL DECISION MAKING: Stable/uncomplicated  EVALUATION COMPLEXITY: Low    GOALS: Short term PT Goals Target date: 02/17/2024 Pt will be I and compliant with HEP. Baseline:  Goal status: New Pt will decrease pain by 25% overall Baseline: Goal status: New  Long term PT goals Target date: 03/14/2024 Pt will improve Lt shoulder AROM to [facility] to improve functional reaching Baseline: Goal status: New Pt will improve  Lt shoulder strength to at least 4+/5 MMT to improve functional strength Baseline: Goal status: New Pt will improve  Quick Dash by 14 points to have significant change per MCID.  Baseline: Goal status: New Pt will reduce pain to overall less than 1-2/10 with usual activity and work activity. Baseline: Goal status: New      5. Pt will return to recreational activities without significant pain or limitations.   Baseline:   Goal Status: New  PLAN: PT FREQUENCY: 1-2 x per week.   PT DURATION: 8-12 weeks.   PLANNED INTERVENTIONS (unless contraindicated): aquatic PT, Canalith repositioning, cryotherapy, Electrical stimulation, Iontophoresis with 4 mg/ml dexamethasome, Moist heat, traction, Ultrasound, gait training, Therapeutic exercise, balance training, neuromuscular re-education, patient/family education, prosthetic training, manual techniques, passive ROM, dry needling, taping, vasopnuematic device, vestibular, spinal manipulations, joint manipulations  PLAN FOR NEXT SESSION: review/update HEP, progress per protocol. Get quick dash score.    Champ Mungo, PT 12/22/2023, 11:38 AM

## 2023-12-26 ENCOUNTER — Encounter: Payer: Medicare HMO | Admitting: Neurology

## 2023-12-27 ENCOUNTER — Ambulatory Visit (HOSPITAL_BASED_OUTPATIENT_CLINIC_OR_DEPARTMENT_OTHER): Admitting: Physical Therapy

## 2023-12-27 ENCOUNTER — Encounter (HOSPITAL_BASED_OUTPATIENT_CLINIC_OR_DEPARTMENT_OTHER): Payer: Self-pay | Admitting: Physical Therapy

## 2023-12-27 DIAGNOSIS — R293 Abnormal posture: Secondary | ICD-10-CM

## 2023-12-27 DIAGNOSIS — M7582 Other shoulder lesions, left shoulder: Secondary | ICD-10-CM | POA: Diagnosis not present

## 2023-12-27 DIAGNOSIS — M25512 Pain in left shoulder: Secondary | ICD-10-CM

## 2023-12-27 DIAGNOSIS — M6281 Muscle weakness (generalized): Secondary | ICD-10-CM

## 2023-12-27 DIAGNOSIS — R252 Cramp and spasm: Secondary | ICD-10-CM

## 2023-12-27 NOTE — Therapy (Signed)
 OUTPATIENT PHYSICAL THERAPY SHOULDER TREATMENT   Patient Name: Brianna Gates MRN: [id number] DOB:26-Nov-1950, 73 y.o., female Today's Date: 12/27/2023  END OF SESSION:  PT End of Session - 12/27/23 1317     Visit Number 2    Date for PT Re-Evaluation 03/14/24    Authorization Type [employer]    Progress Note Due on Visit 10    PT Start Time 1318    PT Stop Time 1356    PT Time Calculation (min) 38 min    Activity Tolerance Patient tolerated treatment well    Behavior During Therapy WFL for tasks assessed/performed             Past Medical History:  Diagnosis Date   Allergy    Anxiety    Asthma    INFREQUENT PROBLEM - rarely used inhaler   CAD (coronary artery disease) 06/29/2023   CCTA 06/27/23: CAC score 15.7 (45th percentile), mild nonobstructive CAD [mid LAD 1-24, proximal LCx 1-24]    Cervical spondylosis    Cervical strain 07/22/2014   Chest pain of uncertain etiology 12/13/2021   Degenerative arthritis    Dyslipidemia    Eczema    GERD (gastroesophageal reflux disease)    Guillain-Barre syndrome (HCC)    History of Guillain-Barre syndrome 07/22/2014   Hyperlipidemia    Hypertension    Hypothyroidism    Lumbosacral spondylosis    Palpitations 12/12/2021   Pure hypercholesterolemia 12/13/2021   Raynaud disease    Thyroid nodule    Past Surgical History:  Procedure Laterality Date   ABDOMINAL HYSTERECTOMY     COLONOSCOPY     ESOPHAGOGASTRODUODENOSCOPY (EGD) WITH PROPOFOL N/A 09/13/2021   Procedure: ESOPHAGOGASTRODUODENOSCOPY (EGD) WITH PROPOFOL;  Surgeon: Hilarie Fredrickson, MD;  Location: [facility];  Service: Endoscopy;  Laterality: N/A;   FOOT SURGERY Left    GANGLION CYST EXCISION Left    Wrist   HERNIA REPAIR     KNEE ARTHROSCOPY     RT KNEE   KNEE ARTHROTOMY Right 08/27/2013   Procedure: RIGHT KNEE SCAR EXCISION AND FEMORAL REVISION;  Surgeon: Loanne Drilling, MD;  Location: [facility];  Service: Orthopedics;  Laterality: Right;    REPLACEMENT TOTAL KNEE Right    2014, 2021   REPLACEMENT TOTAL KNEE BILATERAL Bilateral    SHOULDER ARTHROSCOPY WITH DISTAL CLAVICLE RESECTION Left 12/18/2023   Procedure: LEFT SHOULDER ARTHROSCOPY WITH EXTENSIVE DEBRIDEMENT, DISTAL CLAVICLE RESECTION / ACROMIOPLASTY;  Surgeon: Huel Cote, MD;  Location: [facility];  Service: Orthopedics;  Laterality: Left;   STRABISMUS SURGERY Right    TONSILLECTOMY     vaginal vulvo prolapse  09/16/2015   Patient Active Problem List   Diagnosis Date Noted   Tendinitis of left rotator cuff 12/18/2023   Arthritis of left acromioclavicular joint 12/18/2023   CAD (coronary artery disease) 06/29/2023   Paresthesia 10/18/2022   Onychomycosis 01/29/2022   Chest pain of uncertain etiology 12/13/2021   Pure hypercholesterolemia 12/13/2021   Palpitations 12/12/2021   Esophageal dysphagia    Allergic rhinitis 04/04/2021   Allergic rhinitis due to animal (cat) (dog) hair and dander 04/04/2021   Allergic rhinitis due to pollen 04/04/2021   Hormone deficiency 04/04/2021   Syncope and collapse 12/27/2020   Hypotension 12/27/2020   Bradycardia 12/27/2020   History of COVID-19 12/27/2020   Preoperative evaluation to rule out surgical contraindication 07/16/2020   Arthrofibrosis of knee joint, right 11/26/2019   Arthritis of knee 07/30/2019   Presence of right artificial knee joint 07/30/2019  Sleep apnea 10/16/2018   Shoulder impingement syndrome 01/15/2018   Gastroesophageal reflux disease 02/02/2016   Sensorineural hearing loss (SNHL), bilateral 02/02/2016   Vaginal vault prolapse after hysterectomy 07/28/2015   History of Guillain-Barre syndrome 07/22/2014   Cervical strain 07/22/2014   Postoperative stiffness of total knee replacement (HCC) 08/27/2013   Abnormality of gait 08/22/2012   Cervical spondylosis without myelopathy 08/22/2012   Muscle weakness (generalized) 08/22/2012   Guillain-Barre syndrome (HCC) 08/22/2012   Essential  hypertension 08/22/2012   Strabismus 08/22/2012   Degenerative joint disease of knee, left 08/22/2012   Hypothyroidism 08/22/2012   Raynaud's disease 08/22/2012   Knee joint replacement by other means 08/22/2012    PCP: Chilton Greathouse, MD  REFERRING PROVIDER: Huel Cote, MD  REFERRING DIAG: Tendinitis of left rotator cuff [M75.82]   THERAPY DIAG:  Abnormal posture  Cramp and spasm  Muscle weakness (generalized)  Acute pain of left shoulder  Rationale for Evaluation and Treatment: Rehabilitation  ONSET DATE: 12/18/2023  SUBJECTIVE:                                                                                                                                                                                      SUBJECTIVE STATEMENT: Pt reports she has been wearing her sling 24/7 (per pt is supposed to wear it until she returns to MD). Pt reports that her neck and L shoulder blade (midback) are sore. She reports some numbness and tingling in thumb and first finger of L hand. Pt reports compliance with initial HEP. Pt also reports use of her Lt arm to put hair in pony tail.    Initial evaluation:  Pt presents to PT post op L shoulder arthroscopy with extensive debridement, distal clavicle resection/ acromioplasty. She reports some mild pain in her L shoulder since surgery but thinks it is due to the incisions rubbing against the dressing. She denies any issues with sleeping. She continues to have numbness in her fingers from her recent nerve block. Prior to sx, she was unable to reach [facility], out to the side, and had difficulty dressing her bed.   Hand dominance: Right  PERTINENT HISTORY: HTN, GERD, Gullian- Barre syndrome.   PAIN:  Are you having pain? Yes: NPRS scale: 4/10 Pain location: At surgical site in L shoulder.  Pain description: Achy pain.  Aggravating factors: Movement  Relieving factors: Rest, meds.   PRECAUTIONS: Shoulder  RED FLAGS: None   WEIGHT  BEARING RESTRICTIONS: No  FALLS:  Has patient fallen in last 6 months? No  LIVING ENVIRONMENT: Lives with: lives with their spouse Lives in: House/apartment Stairs: Yes: Internal: 1 flight steps; on right going up and  External: 4-5 steps; bilateral but cannot reach both Has following equipment at home: shower chair and Grab bars  OCCUPATION: Retired   PLOF: Independent  PATIENT GOALS: Pt would like to be able to get back to functional mobility and get back to working out.   NEXT MD VISIT: 12/26/2023  OBJECTIVE:  Note: Objective measures were completed at Evaluation unless otherwise noted.  DIAGNOSTIC FINDINGS:  1. Mild supraspinatus tendinosis with a tiny insertional interstitial tear. 2. Moderate infraspinatus tendinosis.    PATIENT SURVEYS:  Neldon Mc Next session.   COGNITION: Overall cognitive status: Within functional limits for tasks assessed     SENSATION: WFL  POSTURE: Pt is present in sling.   UPPER EXTREMITY ROM:   Passive ROM Right eval Left eval  Shoulder flexion    Shoulder extension    Shoulder abduction    Shoulder adduction    Shoulder internal rotation    Shoulder external rotation    Elbow flexion    Elbow extension    Wrist flexion    Wrist extension    Wrist ulnar deviation    Wrist radial deviation    Wrist pronation    Wrist supination    (Blank rows = not tested)  UPPER EXTREMITY MMT:  MMT Right eval Left eval  Shoulder flexion    Shoulder extension    Shoulder abduction    Shoulder adduction    Shoulder internal rotation    Shoulder external rotation    Middle trapezius    Lower trapezius    Elbow flexion    Elbow extension    Wrist flexion    Wrist extension    Wrist ulnar deviation    Wrist radial deviation    Wrist pronation    Wrist supination    Grip strength (lbs)    (Blank rows = not tested)  JOINT MOBILITY TESTING:  NT today  PALPATION:  No tenderness to surrounding areas.                                                                                                                               TREATMENT [facility] Adult PT Treatment:                                                DATE: 12/27/23 Manual Therapy: PROM of Lt shoulder to tissue limits and no pain - flexion, scaption, IR/ER, ext to neutral. Frequent cues for relaxation of LUE STM to L cervical paraspinals, upper traps, and levator to decrease fascial tightness Therapeutic Exercise: Lt elbow flex / ext x 10 Seated shoulder circles x 5 Seated scap retraction, sliding hands forward/backward on thighs, 3 sec hold x 10 R lateral neck flexion for upper trap stretch L levator stretch x 15s x 2  Seated AAROM table slides into L shoulder  flexion, range to tolerance x 8 Pendulum circles     PATIENT EDUCATION: Education details: HEP,  and POC. Person educated: Patient Education method: Medical illustrator, handout Education comprehension: verbalized understanding and returned demonstration  HOME EXERCISE PROGRAM: Access Code: [email or web address] URL: [email or web address]   ASSESSMENT:  CLINICAL IMPRESSION:  Pt required mod cues to reduce guarding during PROM of Lt shoulder. She tolerated all other exercises well with slight decrease in symptoms at end of session. Updated HEP. Goals are ongoing. Patient will benefit from skilled PT to address below impairments, limitations and improve overall function.    Initial impression: Patient referred to PT for s/p L shoulder arthroscopy with extensive debridement, distal clavicle resection/ acromioplasty. Changed dressings today. Surgical site appears clean with no redness or major swelling noted. She denies any significant pain. Pt provided initial HEP. Patient will benefit from skilled PT to address below impairments, limitations and improve overall function.  OBJECTIVE IMPAIRMENTS: decreased activity tolerance, decreased shoulder mobility, decreased ROM, decreased  strength, impaired flexibility, impaired UE use, postural dysfunction, and pain.  ACTIVITY LIMITATIONS: reaching, lifting, carry,  cleaning, driving, and or occupation  PERSONAL FACTORS:  also affecting patient's functional outcome.  REHAB POTENTIAL: Good  CLINICAL DECISION MAKING: Stable/uncomplicated  EVALUATION COMPLEXITY: Low    GOALS: Short term PT Goals Target date: 02/17/2024 Pt will be I and compliant with HEP. Baseline:  Goal status: New Pt will decrease pain by 25% overall Baseline: Goal status: New  Long term PT goals Target date: 03/14/2024 Pt will improve Lt shoulder AROM to [facility] to improve functional reaching Baseline: Goal status: New Pt will improve  Lt shoulder strength to at least 4+/5 MMT to improve functional strength Baseline: Goal status: New Pt will improve Quick Dash by 14 points to have significant change per MCID.  Baseline: Goal status: New Pt will reduce pain to overall less than 1-2/10 with usual activity and work activity. Baseline: Goal status: New      5. Pt will return to recreational activities without significant pain or limitations.   Baseline:   Goal Status: New  PLAN: PT FREQUENCY: 1-2 x per week.   PT DURATION: 8-12 weeks.   PLANNED INTERVENTIONS (unless contraindicated): aquatic PT, Canalith repositioning, cryotherapy, Electrical stimulation, Iontophoresis with 4 mg/ml dexamethasome, Moist heat, traction, Ultrasound, gait training, Therapeutic exercise, balance training, neuromuscular re-education, patient/family education, prosthetic training, manual techniques, passive ROM, dry needling, taping, vasopnuematic device, vestibular, spinal manipulations, joint manipulations  PLAN FOR NEXT SESSION: review/update HEP, progress per protocol. Get quick dash score.   Mayer Camel, PTA 12/27/23 2:22 PM [facility] [address] [place], [place], [address] Phone:  [phone]   Fax:  [phone]

## 2023-12-31 ENCOUNTER — Encounter (HOSPITAL_BASED_OUTPATIENT_CLINIC_OR_DEPARTMENT_OTHER): Payer: Self-pay | Admitting: Physical Therapy

## 2023-12-31 ENCOUNTER — Ambulatory Visit (HOSPITAL_BASED_OUTPATIENT_CLINIC_OR_DEPARTMENT_OTHER): Admitting: Physical Therapy

## 2023-12-31 DIAGNOSIS — R252 Cramp and spasm: Secondary | ICD-10-CM

## 2023-12-31 DIAGNOSIS — R293 Abnormal posture: Secondary | ICD-10-CM

## 2023-12-31 DIAGNOSIS — M25512 Pain in left shoulder: Secondary | ICD-10-CM

## 2023-12-31 DIAGNOSIS — M6281 Muscle weakness (generalized): Secondary | ICD-10-CM

## 2023-12-31 DIAGNOSIS — M7582 Other shoulder lesions, left shoulder: Secondary | ICD-10-CM | POA: Diagnosis not present

## 2023-12-31 NOTE — Therapy (Signed)
 OUTPATIENT PHYSICAL THERAPY SHOULDER TREATMENT   Patient Name: Brianna Gates MRN: [id number] DOB:Oct 06, 1951, 73 y.o., female Today's Date: 12/31/2023  END OF SESSION:  PT End of Session - 12/31/23 0853     Visit Number 3    Date for PT Re-Evaluation 03/14/24    Authorization Type [employer] Medicare    Progress Note Due on Visit 10    PT Start Time [phone]    PT Stop Time 0935    PT Time Calculation (min) 43 min    Activity Tolerance Patient tolerated treatment well    Behavior During Therapy [facility] for tasks assessed/performed             Past Medical History:  Diagnosis Date   Allergy    Anxiety    Asthma    INFREQUENT PROBLEM - rarely used inhaler   CAD (coronary artery disease) 06/29/2023   CCTA 06/27/23: CAC score 15.7 (45th percentile), mild nonobstructive CAD [mid LAD 1-24, proximal LCx 1-24]    Cervical spondylosis    Cervical strain 07/22/2014   Chest pain of uncertain etiology 12/13/2021   Degenerative arthritis    Dyslipidemia    Eczema    GERD (gastroesophageal reflux disease)    Guillain-Barre syndrome (HCC)    History of Guillain-Barre syndrome 07/22/2014   Hyperlipidemia    Hypertension    Hypothyroidism    Lumbosacral spondylosis    Palpitations 12/12/2021   Pure hypercholesterolemia 12/13/2021   Raynaud disease    Thyroid nodule    Past Surgical History:  Procedure Laterality Date   ABDOMINAL HYSTERECTOMY     COLONOSCOPY     ESOPHAGOGASTRODUODENOSCOPY (EGD) WITH PROPOFOL N/A 09/13/2021   Procedure: ESOPHAGOGASTRODUODENOSCOPY (EGD) WITH PROPOFOL;  Surgeon: Hilarie Fredrickson, MD;  Location: [facility];  Service: Endoscopy;  Laterality: N/A;   FOOT SURGERY Left    GANGLION CYST EXCISION Left    Wrist   HERNIA REPAIR     KNEE ARTHROSCOPY     RT KNEE   KNEE ARTHROTOMY Right 08/27/2013   Procedure: RIGHT KNEE SCAR EXCISION AND FEMORAL REVISION;  Surgeon: Loanne Drilling, MD;  Location: [facility];  Service: Orthopedics;  Laterality: Right;    REPLACEMENT TOTAL KNEE Right    2014, 2021   REPLACEMENT TOTAL KNEE BILATERAL Bilateral    SHOULDER ARTHROSCOPY WITH DISTAL CLAVICLE RESECTION Left 12/18/2023   Procedure: LEFT SHOULDER ARTHROSCOPY WITH EXTENSIVE DEBRIDEMENT, DISTAL CLAVICLE RESECTION / ACROMIOPLASTY;  Surgeon: Huel Cote, MD;  Location: [facility];  Service: Orthopedics;  Laterality: Left;   STRABISMUS SURGERY Right    TONSILLECTOMY     vaginal vulvo prolapse  09/16/2015   Patient Active Problem List   Diagnosis Date Noted   Tendinitis of left rotator cuff 12/18/2023   Arthritis of left acromioclavicular joint 12/18/2023   CAD (coronary artery disease) 06/29/2023   Paresthesia 10/18/2022   Onychomycosis 01/29/2022   Chest pain of uncertain etiology 12/13/2021   Pure hypercholesterolemia 12/13/2021   Palpitations 12/12/2021   Esophageal dysphagia    Allergic rhinitis 04/04/2021   Allergic rhinitis due to animal (cat) (dog) hair and dander 04/04/2021   Allergic rhinitis due to pollen 04/04/2021   Hormone deficiency 04/04/2021   Syncope and collapse 12/27/2020   Hypotension 12/27/2020   Bradycardia 12/27/2020   History of COVID-19 12/27/2020   Preoperative evaluation to rule out surgical contraindication 07/16/2020   Arthrofibrosis of knee joint, right 11/26/2019   Arthritis of knee 07/30/2019   Presence of right artificial knee joint 07/30/2019  Sleep apnea 10/16/2018   Shoulder impingement syndrome 01/15/2018   Gastroesophageal reflux disease 02/02/2016   Sensorineural hearing loss (SNHL), bilateral 02/02/2016   Vaginal vault prolapse after hysterectomy 07/28/2015   History of Guillain-Barre syndrome 07/22/2014   Cervical strain 07/22/2014   Postoperative stiffness of total knee replacement (HCC) 08/27/2013   Abnormality of gait 08/22/2012   Cervical spondylosis without myelopathy 08/22/2012   Muscle weakness (generalized) 08/22/2012   Guillain-Barre syndrome (HCC) 08/22/2012   Essential  hypertension 08/22/2012   Strabismus 08/22/2012   Degenerative joint disease of knee, left 08/22/2012   Hypothyroidism 08/22/2012   Raynaud's disease 08/22/2012   Knee joint replacement by other means 08/22/2012    PCP: Chilton Greathouse, MD  REFERRING PROVIDER: Huel Cote, MD  REFERRING DIAG: Tendinitis of left rotator cuff [M75.82]  Arthroscopic extensive debridement - 29823 Supraspinatus Tendon, Anterior Labrum, Superior Labrum, and Posterior Labrum Arthroscopic distal clavicle excision - 56213 Arthroscopic subacromial decompression - 08657  THERAPY DIAG:  Abnormal posture  Cramp and spasm  Muscle weakness (generalized)  Acute pain of left shoulder  Rationale for Evaluation and Treatment: Rehabilitation  ONSET DATE: DOS 12/18/2023  SUBJECTIVE:                                                                                                                                                                                      SUBJECTIVE STATEMENT: Pt reports she has been having some neck and shoulder blade pain. Reports she is using arm to reach and do dishes while in sling.    Initial evaluation:  Pt presents to PT post op L shoulder arthroscopy with extensive debridement, distal clavicle resection/ acromioplasty. She reports some mild pain in her L shoulder since surgery but thinks it is due to the incisions rubbing against the dressing. She denies any issues with sleeping. She continues to have numbness in her fingers from her recent nerve block. Prior to sx, she was unable to reach [facility], out to the side, and had difficulty dressing her bed.   Hand dominance: Right  PERTINENT HISTORY: HTN, GERD, Gullian- Barre syndrome.   PAIN:  Are you having pain? Yes: NPRS scale: 4/10 Pain location: At surgical site in L shoulder.  Pain description: Achy pain.  Aggravating factors: Movement  Relieving factors: Rest, meds.   PRECAUTIONS: Shoulder  RED FLAGS: None   WEIGHT  BEARING RESTRICTIONS: No  FALLS:  Has patient fallen in last 6 months? No  LIVING ENVIRONMENT: Lives with: lives with their spouse Lives in: House/apartment Stairs: Yes: Internal: 1 flight steps; on right going up and External: 4-5 steps; bilateral but cannot reach both Has following equipment at home: shower chair and  Grab bars  OCCUPATION: Retired   PLOF: Independent  PATIENT GOALS: Pt would like to be able to get back to functional mobility and get back to working out.   NEXT MD VISIT: 12/26/2023  OBJECTIVE:  Note: Objective measures were completed at Evaluation unless otherwise noted.  DIAGNOSTIC FINDINGS:  1. Mild supraspinatus tendinosis with a tiny insertional interstitial tear. 2. Moderate infraspinatus tendinosis.    PATIENT SURVEYS:  Neldon Mc Next session.   COGNITION: Overall cognitive status: Within functional limits for tasks assessed     SENSATION: WFL  POSTURE: Pt is present in sling.   UPPER EXTREMITY ROM:   Passive ROM Right eval Left eval  Shoulder flexion    Shoulder extension    Shoulder abduction    Shoulder adduction    Shoulder internal rotation    Shoulder external rotation    Elbow flexion    Elbow extension    Wrist flexion    Wrist extension    Wrist ulnar deviation    Wrist radial deviation    Wrist pronation    Wrist supination    (Blank rows = not tested)  UPPER EXTREMITY MMT:  MMT Right eval Left eval  Shoulder flexion    Shoulder extension    Shoulder abduction    Shoulder adduction    Shoulder internal rotation    Shoulder external rotation    Middle trapezius    Lower trapezius    Elbow flexion    Elbow extension    Wrist flexion    Wrist extension    Wrist ulnar deviation    Wrist radial deviation    Wrist pronation    Wrist supination    Grip strength (lbs)    (Blank rows = not tested)  JOINT MOBILITY TESTING:  NT today  PALPATION:  No tenderness to surrounding areas.                                                                                                                               TREATMENT Treatment                            3/17: Blank lines following charge title = not provided on this treatment date.   Manual:  TPDN No STM to left upper trap There-ex: Pendulum Flexion roll on foam roll Seated posture for core + scapular retraction, + shoulder rolls Retraction + upper trap stretch Retraction + biceps curls full range--> progressed to modified median nerve glide Seated AROM row motion, short arc overhead reach There-Act:  Self Care:  Nuro-Re-ed:  Gait Training:   [facility] Adult PT Treatment:                                                DATE:  12/27/23 Manual Therapy: PROM of Lt shoulder to tissue limits and no pain - flexion, scaption, IR/ER, ext to neutral. Frequent cues for relaxation of LUE STM to L cervical paraspinals, upper traps, and levator to decrease fascial tightness Therapeutic Exercise: Lt elbow flex / ext x 10 Seated shoulder circles x 5 Seated scap retraction, sliding hands forward/backward on thighs, 3 sec hold x 10 R lateral neck flexion for upper trap stretch L levator stretch x 15s x 2  Seated AAROM table slides into L shoulder flexion, range to tolerance x 8 Pendulum circles     PATIENT EDUCATION: Education details: HEP,  and POC. Person educated: Patient Education method: Medical illustrator, handout Education comprehension: verbalized understanding and returned demonstration  HOME EXERCISE PROGRAM: Access Code: [email or web address] URL: [email or web address]   ASSESSMENT:  CLINICAL IMPRESSION:  Pt has been overusing her arm while in sling rather than resting. Time taken today to explain rationale for sling and expected progression with rest vs overuse and irritation.  Overall she is progressing very well, AAROM on roller is 90% of opposite UE and was able to demo AROM in short arc to 110 without pain.     Initial impression: Patient referred to PT for s/p L shoulder arthroscopy with extensive debridement, distal clavicle resection/ acromioplasty. Changed dressings today. Surgical site appears clean with no redness or major swelling noted. She denies any significant pain. Pt provided initial HEP. Patient will benefit from skilled PT to address below impairments, limitations and improve overall function.  OBJECTIVE IMPAIRMENTS: decreased activity tolerance, decreased shoulder mobility, decreased ROM, decreased strength, impaired flexibility, impaired UE use, postural dysfunction, and pain.  ACTIVITY LIMITATIONS: reaching, lifting, carry,  cleaning, driving, and or occupation  PERSONAL FACTORS:  also affecting patient's functional outcome.  REHAB POTENTIAL: Good  CLINICAL DECISION MAKING: Stable/uncomplicated  EVALUATION COMPLEXITY: Low    GOALS: Short term PT Goals Target date: 02/17/2024 Pt will be I and compliant with HEP. Baseline:  Goal status: New Pt will decrease pain by 25% overall Baseline: Goal status: New  Long term PT goals Target date: 03/14/2024 Pt will improve Lt shoulder AROM to [facility] to improve functional reaching Baseline: Goal status: New Pt will improve  Lt shoulder strength to at least 4+/5 MMT to improve functional strength Baseline: Goal status: New Pt will improve Quick Dash by 14 points to have significant change per MCID.  Baseline: Goal status: New Pt will reduce pain to overall less than 1-2/10 with usual activity and work activity. Baseline: Goal status: New      5. Pt will return to recreational activities without significant pain or limitations.   Baseline:   Goal Status: New  PLAN: PT FREQUENCY: 1-2 x per week.   PT DURATION: 8-12 weeks.   PLANNED INTERVENTIONS (unless contraindicated): aquatic PT, Canalith repositioning, cryotherapy, Electrical stimulation, Iontophoresis with 4 mg/ml dexamethasome, Moist heat, traction, Ultrasound, gait  training, Therapeutic exercise, balance training, neuromuscular re-education, patient/family education, prosthetic training, manual techniques, passive ROM, dry needling, taping, vasopnuematic device, vestibular, spinal manipulations, joint manipulations  PLAN FOR NEXT SESSION: review/update HEP, progress per protocol.   Tashai Catino C. Blessyn [place] PT, DPT 12/31/23 9:38 AM  [facility] [address] [place], [place], [address] Phone: [phone]   Fax:  [phone]

## 2024-01-01 DIAGNOSIS — J301 Allergic rhinitis due to pollen: Secondary | ICD-10-CM | POA: Diagnosis not present

## 2024-01-01 DIAGNOSIS — J3089 Other allergic rhinitis: Secondary | ICD-10-CM | POA: Diagnosis not present

## 2024-01-01 DIAGNOSIS — J3081 Allergic rhinitis due to animal (cat) (dog) hair and dander: Secondary | ICD-10-CM | POA: Diagnosis not present

## 2024-01-02 ENCOUNTER — Ambulatory Visit (HOSPITAL_BASED_OUTPATIENT_CLINIC_OR_DEPARTMENT_OTHER): Payer: Medicare HMO | Admitting: Orthopaedic Surgery

## 2024-01-02 DIAGNOSIS — M7582 Other shoulder lesions, left shoulder: Secondary | ICD-10-CM

## 2024-01-02 NOTE — Progress Notes (Signed)
 Chief Complaint: Left shoulder acromioplasty with distal clavicectomy 12/18/23     History of Present Illness:   01/02/2024: Presents 2 weeks status post the above procedure.  Overall she is doing well.  She is experiencing some pain and triggering about the left trapezius as well as latissimus muscles she is working on reestablishing overhead range of motion   PMH/PSH/Family History/Social History/Meds/Allergies:    Past Medical History:  Diagnosis Date   Allergy    Anxiety    Asthma    INFREQUENT PROBLEM - rarely used inhaler   CAD (coronary artery disease) 06/29/2023   CCTA 06/27/23: CAC score 15.7 (45th percentile), mild nonobstructive CAD [mid LAD 1-24, proximal LCx 1-24]    Cervical spondylosis    Cervical strain 07/22/2014   Chest pain of uncertain etiology 12/13/2021   Degenerative arthritis    Dyslipidemia    Eczema    GERD (gastroesophageal reflux disease)    Guillain-Barre syndrome (HCC)    History of Guillain-Barre syndrome 07/22/2014   Hyperlipidemia    Hypertension    Hypothyroidism    Lumbosacral spondylosis    Palpitations 12/12/2021   Pure hypercholesterolemia 12/13/2021   Raynaud disease    Thyroid nodule    Past Surgical History:  Procedure Laterality Date   ABDOMINAL HYSTERECTOMY     COLONOSCOPY     ESOPHAGOGASTRODUODENOSCOPY (EGD) WITH PROPOFOL N/A 09/13/2021   Procedure: ESOPHAGOGASTRODUODENOSCOPY (EGD) WITH PROPOFOL;  Surgeon: Hilarie Fredrickson, MD;  Location: [facility];  Service: Endoscopy;  Laterality: N/A;   FOOT SURGERY Left    GANGLION CYST EXCISION Left    Wrist   HERNIA REPAIR     KNEE ARTHROSCOPY     RT KNEE   KNEE ARTHROTOMY Right 08/27/2013   Procedure: RIGHT KNEE SCAR EXCISION AND FEMORAL REVISION;  Surgeon: Loanne Drilling, MD;  Location: [facility];  Service: Orthopedics;  Laterality: Right;   REPLACEMENT TOTAL KNEE Right    2014, 2021   REPLACEMENT TOTAL KNEE BILATERAL Bilateral    SHOULDER ARTHROSCOPY WITH DISTAL CLAVICLE  RESECTION Left 12/18/2023   Procedure: LEFT SHOULDER ARTHROSCOPY WITH EXTENSIVE DEBRIDEMENT, DISTAL CLAVICLE RESECTION / ACROMIOPLASTY;  Surgeon: Huel Cote, MD;  Location: [facility];  Service: Orthopedics;  Laterality: Left;   STRABISMUS SURGERY Right    TONSILLECTOMY     vaginal vulvo prolapse  09/16/2015   Social History   Socioeconomic History   Marital status: Married    Spouse name: Not on file   Number of children: 2   Years of education: college   Highest education level: Not on file  Occupational History    Employer: UNEMPLOYED  Tobacco Use   Smoking status: Never   Smokeless tobacco: Never  Vaping Use   Vaping status: Never Used  Substance and Sexual Activity   Alcohol use: Not Currently    Comment: stopped drinking   Drug use: No   Sexual activity: Yes    Birth control/protection: Other-see comments, Post-menopausal    Comment: Hysterectomy  Other Topics Concern   Not on file  Social History Narrative   Lives at home, married   Patient is right handed.   Patient drinks 2-3 cups caffeine daily.   Social Drivers of Corporate investment banker Strain: Not on file  Food Insecurity: Not on file  Transportation Needs: Not on file  Physical Activity: Not on file  Stress: Not on file  Social Connections: Not on file   Family History  Problem Relation Age of Onset  Hypertension Mother    Other Mother        Dyslipidemia   Stroke Mother    Heart attack Father        46s   Heart disease Father    Hemochromatosis Father    Hemochromatosis Brother    Hyperlipidemia Brother    Hypertension Brother    Other Brother        Dyslipidemia   Stomach cancer Paternal Uncle    Colon cancer Neg Hx    Rectal cancer Neg Hx    Esophageal cancer Neg Hx    Allergies  Allergen Reactions   Captopril Other (See Comments)    reaction   Oxaprozin Hives and Other (See Comments)   Ramipril Cough and Other (See Comments)   Zoloft [Sertraline Hcl]  Diarrhea   Ace Inhibitors     Other reaction(s): Other (See Comments) GB syndrome   Doxycycline Other (See Comments)    Burning sensation/Rash   Influenza Vac Split Quad     Other Reaction(s): Guillain-Barr syndrome   Influenza Vaccines     HX    Sulfa Antibiotics Other (See Comments)    unknown   Current Outpatient Medications  Medication Sig Dispense Refill   acetaminophen (TYLENOL) 650 MG CR tablet Take 1,300 mg by mouth every 8 (eight) hours as needed for pain or fever.     albuterol (PROVENTIL HFA;VENTOLIN HFA) 108 (90 BASE) MCG/ACT inhaler Inhale 2 puffs into the lungs every 6 (six) hours as needed for wheezing or shortness of breath.     amLODipine (NORVASC) 2.5 MG tablet Take 1 tablet (2.5 mg total) by mouth every evening. 90 tablet 3   amoxicillin (AMOXIL) 500 MG capsule Take 2,000 mg by mouth once as needed (prior to dental appointments). (Patient not taking: Reported on 12/03/2023)     Ascorbic Acid (VITAMIN C PO) Take 1 tablet by mouth as needed (sick symptoms).     aspirin EC 325 MG tablet Take 1 tablet (325 mg total) by mouth daily. 14 tablet 0   azelastine (ASTELIN) 0.1 % nasal spray Place 1 spray into both nostrils 2 (two) times daily as needed (cold symptoms).     busPIRone (BUSPAR) 30 MG tablet Take 30 mg by mouth 2 (two) times daily.     Cholecalciferol (VITAMIN D) 2000 UNITS CAPS Take 2,000 Units by mouth daily in the afternoon.     COVID-19 mRNA vaccine 2023-2024 (COMIRNATY) SUSP injection Inject into the muscle. 0.3 mL 0   COVID-19 mRNA vaccine, Pfizer, (COMIRNATY) syringe Inject into the muscle. 0.3 mL 0   Emollient (CETAPHIL) cream Apply 1 application topically daily.     EPINEPHrine 0.3 mg/0.3 mL IJ SOAJ injection Inject 0.3 mg into the muscle once as needed for anaphylaxis.     estradiol (ESTRACE) 0.1 MG/GM vaginal cream Place 1 Applicatorful vaginally 2 (two) times a week.     fexofenadine-pseudoephedrine (ALLEGRA-D 24) 180-240 MG 24 hr tablet Take 1 tablet by  mouth daily.     gabapentin (NEURONTIN) 400 MG capsule Take 1 capsule (400 mg total) by mouth 3 (three) times daily. 90 capsule 5   ibuprofen (ADVIL,MOTRIN) 100 MG tablet Take 400 mg by mouth daily at 12 noon. May take in the pm additionally as needed     ketoconazole (NIZORAL) 2 % cream Apply 1 application topically daily. (Patient taking differently: Apply 1 application  topically as needed.) 60 g 0   levothyroxine (SYNTHROID) 50 MCG tablet Take 50 mcg by mouth daily before  breakfast.      losartan (COZAAR) 50 MG tablet Take 1 tablet (50 mg total) by mouth in the morning. 90 tablet 3   montelukast (SINGULAIR) 10 MG tablet Take 10 mg by mouth at bedtime.     oxyCODONE (ROXICODONE) 5 MG immediate release tablet Take 1 tablet (5 mg total) by mouth every 4 (four) hours as needed for severe pain (pain score 7-10) or breakthrough pain. (Patient not taking: Reported on 12/03/2023) 30 tablet 0   pantoprazole (PROTONIX) 40 MG tablet Take 1 tablet (40 mg total) by mouth 2 (two) times daily. Office visit for further refills (Patient taking differently: Take 40 mg by mouth as needed. Office visit for further refills) 180 tablet 0   rosuvastatin (CRESTOR) 10 MG tablet Take 1 tablet (10 mg total) by mouth every other day. 90 tablet 3   Simethicone (PHAZYME PO) Take 250 mg by mouth daily as needed (Gas).     triamcinolone cream (KENALOG) 0.1 % 1 application as needed (itching bumps on stomach). Mixed with Cetaphil     VEVYE 0.1 % SOLN Apply 1 drop to eye 2 (two) times daily.     No current facility-administered medications for this visit.   No results found.  Review of Systems:   A ROS was performed including pertinent positives and negatives as documented in the HPI.  Physical Exam :   Constitutional: NAD and appears stated age Neurological: Alert and oriented Psych: Appropriate affect and cooperative There were no vitals taken for this visit.   Comprehensive Musculoskeletal Exam:    Left shoulder  incisions are well-appearing without erythema or drainage.  Active forward elevation is 240 compared to 160 on the contralateral side.  External rotation is to 30 degrees compared to 50 on the contralateral side.  Internal rotation deferred today   Imaging:    I personally reviewed and interpreted the radiographs.   Assessment and Plan:   73 y.o. female status post acromioplasty and arthroscopic debridement.  At this time I do believe she would benefit from dry needling and physical therapy of both the trapezius as well as the latissimus muscles to help with her trigger points.  She will continue to work on active range of motion as well as strengthening as tolerated.  I will plan to see her back in 4 weeks for reassessment  -Return to clinic 4 weeks for reassessment   I personally saw and evaluated the patient, and participated in the management and treatment plan.  Huel Cote, MD Attending Physician, Orthopedic Surgery  This document was dictated using Dragon voice recognition software. A reasonable attempt at proof reading has been made to minimize errors.

## 2024-01-09 ENCOUNTER — Encounter (HOSPITAL_BASED_OUTPATIENT_CLINIC_OR_DEPARTMENT_OTHER): Payer: Self-pay | Admitting: Physical Therapy

## 2024-01-09 ENCOUNTER — Ambulatory Visit (HOSPITAL_BASED_OUTPATIENT_CLINIC_OR_DEPARTMENT_OTHER): Admitting: Physical Therapy

## 2024-01-09 DIAGNOSIS — R293 Abnormal posture: Secondary | ICD-10-CM

## 2024-01-09 DIAGNOSIS — M25512 Pain in left shoulder: Secondary | ICD-10-CM

## 2024-01-09 DIAGNOSIS — M6281 Muscle weakness (generalized): Secondary | ICD-10-CM

## 2024-01-09 DIAGNOSIS — M7582 Other shoulder lesions, left shoulder: Secondary | ICD-10-CM | POA: Diagnosis not present

## 2024-01-09 DIAGNOSIS — R252 Cramp and spasm: Secondary | ICD-10-CM

## 2024-01-09 NOTE — Therapy (Signed)
 OUTPATIENT PHYSICAL THERAPY SHOULDER TREATMENT   Patient Name: Brianna Gates MRN: [id number] DOB:20-Jul-1951, 73 y.o., female Today's Date: 01/09/2024  END OF SESSION:  PT End of Session - 01/09/24 0943     Visit Number 4    Date for PT Re-Evaluation 03/14/24    Authorization Type [employer]    Progress Note Due on Visit 10    PT Start Time [phone]    PT Stop Time 1012    PT Time Calculation (min) 38 min    Behavior During Therapy [facility] for tasks assessed/performed             Past Medical History:  Diagnosis Date   Allergy    Anxiety    Asthma    INFREQUENT PROBLEM - rarely used inhaler   CAD (coronary artery disease) 06/29/2023   CCTA 06/27/23: CAC score 15.7 (45th percentile), mild nonobstructive CAD [mid LAD 1-24, proximal LCx 1-24]    Cervical spondylosis    Cervical strain 07/22/2014   Chest pain of uncertain etiology 12/13/2021   Degenerative arthritis    Dyslipidemia    Eczema    GERD (gastroesophageal reflux disease)    Guillain-Barre syndrome (HCC)    History of Guillain-Barre syndrome 07/22/2014   Hyperlipidemia    Hypertension    Hypothyroidism    Lumbosacral spondylosis    Palpitations 12/12/2021   Pure hypercholesterolemia 12/13/2021   Raynaud disease    Thyroid nodule    Past Surgical History:  Procedure Laterality Date   ABDOMINAL HYSTERECTOMY     COLONOSCOPY     ESOPHAGOGASTRODUODENOSCOPY (EGD) WITH PROPOFOL N/A 09/13/2021   Procedure: ESOPHAGOGASTRODUODENOSCOPY (EGD) WITH PROPOFOL;  Surgeon: Hilarie Fredrickson, MD;  Location: [facility];  Service: Endoscopy;  Laterality: N/A;   FOOT SURGERY Left    GANGLION CYST EXCISION Left    Wrist   HERNIA REPAIR     KNEE ARTHROSCOPY     RT KNEE   KNEE ARTHROTOMY Right 08/27/2013   Procedure: RIGHT KNEE SCAR EXCISION AND FEMORAL REVISION;  Surgeon: Loanne Drilling, MD;  Location: [facility];  Service: Orthopedics;  Laterality: Right;   REPLACEMENT TOTAL KNEE Right    2014, 2021   REPLACEMENT TOTAL  KNEE BILATERAL Bilateral    SHOULDER ARTHROSCOPY WITH DISTAL CLAVICLE RESECTION Left 12/18/2023   Procedure: LEFT SHOULDER ARTHROSCOPY WITH EXTENSIVE DEBRIDEMENT, DISTAL CLAVICLE RESECTION / ACROMIOPLASTY;  Surgeon: Huel Cote, MD;  Location: [facility];  Service: Orthopedics;  Laterality: Left;   STRABISMUS SURGERY Right    TONSILLECTOMY     vaginal vulvo prolapse  09/16/2015   Patient Active Problem List   Diagnosis Date Noted   Tendinitis of left rotator cuff 12/18/2023   Arthritis of left acromioclavicular joint 12/18/2023   CAD (coronary artery disease) 06/29/2023   Paresthesia 10/18/2022   Onychomycosis 01/29/2022   Chest pain of uncertain etiology 12/13/2021   Pure hypercholesterolemia 12/13/2021   Palpitations 12/12/2021   Esophageal dysphagia    Allergic rhinitis 04/04/2021   Allergic rhinitis due to animal (cat) (dog) hair and dander 04/04/2021   Allergic rhinitis due to pollen 04/04/2021   Hormone deficiency 04/04/2021   Syncope and collapse 12/27/2020   Hypotension 12/27/2020   Bradycardia 12/27/2020   History of COVID-19 12/27/2020   Preoperative evaluation to rule out surgical contraindication 07/16/2020   Arthrofibrosis of knee joint, right 11/26/2019   Arthritis of knee 07/30/2019   Presence of right artificial knee joint 07/30/2019   Sleep apnea 10/16/2018   Shoulder impingement syndrome  01/15/2018   Gastroesophageal reflux disease 02/02/2016   Sensorineural hearing loss (SNHL), bilateral 02/02/2016   Vaginal vault prolapse after hysterectomy 07/28/2015   History of Guillain-Barre syndrome 07/22/2014   Cervical strain 07/22/2014   Postoperative stiffness of total knee replacement (HCC) 08/27/2013   Abnormality of gait 08/22/2012   Cervical spondylosis without myelopathy 08/22/2012   Muscle weakness (generalized) 08/22/2012   Guillain-Barre syndrome (HCC) 08/22/2012   Essential hypertension 08/22/2012   Strabismus 08/22/2012   Degenerative  joint disease of knee, left 08/22/2012   Hypothyroidism 08/22/2012   Raynaud's disease 08/22/2012   Knee joint replacement by other means 08/22/2012    PCP: Chilton Greathouse, MD  REFERRING PROVIDER: Huel Cote, MD  REFERRING DIAG: Tendinitis of left rotator cuff [M75.82]  Arthroscopic extensive debridement - 29823 Supraspinatus Tendon, Anterior Labrum, Superior Labrum, and Posterior Labrum Arthroscopic distal clavicle excision - 16109 Arthroscopic subacromial decompression - 60454  THERAPY DIAG:  Abnormal posture  Cramp and spasm  Muscle weakness (generalized)  Acute pain of left shoulder  Rationale for Evaluation and Treatment: Rehabilitation  ONSET DATE: DOS 12/18/2023  SUBJECTIVE:                                                                                                                                                                                      SUBJECTIVE STATEMENT: Pt reports she saw the  doctor and he recommended DN for her neck and shoulder.  Pt reports she has been working on [employer].    Initial evaluation:  Pt presents to PT post op L shoulder arthroscopy with extensive debridement, distal clavicle resection/ acromioplasty. She reports some mild pain in her L shoulder since surgery but thinks it is due to the incisions rubbing against the dressing. She denies any issues with sleeping. She continues to have numbness in her fingers from her recent nerve block. Prior to sx, she was unable to reach [facility], out to the side, and had difficulty dressing her bed.   Hand dominance: Right  PERTINENT HISTORY: HTN, GERD, Gullian- Barre syndrome.   PAIN:  Are you having pain? no: NPRS scale: 0 Pain location:  Pain description: Aggravating factors:  Relieving factors:    PRECAUTIONS: Shoulder  RED FLAGS: None   WEIGHT BEARING RESTRICTIONS: No  FALLS:  Has patient fallen in last 6 months? No  LIVING ENVIRONMENT: Lives with: lives with their  spouse Lives in: House/apartment Stairs: Yes: Internal: 1 flight steps; on right going up and External: 4-5 steps; bilateral but cannot reach both Has following equipment at home: shower chair and Grab bars  OCCUPATION: Retired   PLOF: Independent  PATIENT GOALS: Pt would like to be able to  get back to functional mobility and get back to working out.   NEXT MD VISIT: 12/26/2023  OBJECTIVE:  Note: Objective measures were completed at Evaluation unless otherwise noted.  DIAGNOSTIC FINDINGS:  1. Mild supraspinatus tendinosis with a tiny insertional interstitial tear. 2. Moderate infraspinatus tendinosis.    PATIENT SURVEYS:  Neldon Mc Next session.   COGNITION: Overall cognitive status: Within functional limits for tasks assessed     SENSATION: WFL  POSTURE: Pt is present in sling.   UPPER EXTREMITY ROM:   Passive ROM Right eval Left eval Left  01/09/24  Shoulder flexion   AAROM supine with cane, 155  Shoulder extension     Shoulder abduction     Shoulder adduction     Shoulder internal rotation     Shoulder external rotation     Elbow flexion     Elbow extension     Wrist flexion     Wrist extension     Wrist ulnar deviation     Wrist radial deviation     Wrist pronation     Wrist supination     (Blank rows = not tested)  UPPER EXTREMITY MMT:  MMT Right eval Left eval  Shoulder flexion    Shoulder extension    Shoulder abduction    Shoulder adduction    Shoulder internal rotation    Shoulder external rotation    Middle trapezius    Lower trapezius    Elbow flexion    Elbow extension    Wrist flexion    Wrist extension    Wrist ulnar deviation    Wrist radial deviation    Wrist pronation    Wrist supination    Grip strength (lbs)    (Blank rows = not tested)  JOINT MOBILITY TESTING:  NT today  PALPATION:  No tenderness to surrounding areas.                                                                                                                               TREATMENT [facility] Adult PT Treatment:                                                DATE: 01/09/24  Therapeutic Exercise: Pulleys into L shoulder flexion and abdct, with tactile cues to not elevate scapula, and neutral head position Cane AAROM in standing - into shoulder ext x 10, into shoulder IR Cane AAROM in supine- bilat shoulder flexion x 10 Star gazer stretch x 30s x 2 Snow angels to tolerance  Manual Therapy: STM to L thoracic paraspinals, upper traps, and levator to decrease fascial tightness   Treatment                            3/17: Marquita Palms  lines following charge title = not provided on this treatment date.   Manual:  TPDN No STM to left upper trap There-ex: Pendulum Flexion roll on foam roll Seated posture for core + scapular retraction, + shoulder rolls Retraction + upper trap stretch Retraction + biceps curls full range--> progressed to modified median nerve glide Seated AROM row motion, short arc overhead reach There-Act:  Self Care:  Nuro-Re-ed:  Gait Training:   [facility] Adult PT Treatment:                                                DATE: 12/27/23 Manual Therapy: PROM of Lt shoulder to tissue limits and no pain - flexion, scaption, IR/ER, ext to neutral. Frequent cues for relaxation of LUE STM to L cervical paraspinals, upper traps, and levator to decrease fascial tightness Therapeutic Exercise: Lt elbow flex / ext x 10 Seated shoulder circles x 5 Seated scap retraction, sliding hands forward/backward on thighs, 3 sec hold x 10 R lateral neck flexion for upper trap stretch L levator stretch x 15s x 2  Seated AAROM table slides into L shoulder flexion, range to tolerance x 8 Pendulum circles     PATIENT EDUCATION: Education details: HEP,  and POC. Person educated: Patient Education method: Medical illustrator, handout Education comprehension: verbalized understanding and returned demonstration  HOME EXERCISE  PROGRAM: Access Code: [email or web address] URL: [email or web address]   ASSESSMENT:  CLINICAL IMPRESSION:   Pt given cues to avoid hiking L scapula and L lateral cervical flexion with shoulder flexion during AAROM.  All exercises completed within pt tolerance. Updated HEP - will continue to progress as tolerated. Goals are ongoing.    Initial impression: Patient referred to PT for s/p L shoulder arthroscopy with extensive debridement, distal clavicle resection/ acromioplasty. Changed dressings today. Surgical site appears clean with no redness or major swelling noted. She denies any significant pain. Pt provided initial HEP. Patient will benefit from skilled PT to address below impairments, limitations and improve overall function.  OBJECTIVE IMPAIRMENTS: decreased activity tolerance, decreased shoulder mobility, decreased ROM, decreased strength, impaired flexibility, impaired UE use, postural dysfunction, and pain.  ACTIVITY LIMITATIONS: reaching, lifting, carry,  cleaning, driving, and or occupation  PERSONAL FACTORS:  also affecting patient's functional outcome.  REHAB POTENTIAL: Good  CLINICAL DECISION MAKING: Stable/uncomplicated  EVALUATION COMPLEXITY: Low    GOALS: Short term PT Goals Target date: 02/17/2024 Pt will be I and compliant with HEP. Baseline:  Goal status: New Pt will decrease pain by 25% overall Baseline: Goal status: New  Long term PT goals Target date: 03/14/2024 Pt will improve Lt shoulder AROM to [facility] to improve functional reaching Baseline: Goal status: New Pt will improve  Lt shoulder strength to at least 4+/5 MMT to improve functional strength Baseline: Goal status: New Pt will improve Quick Dash by 14 points to have significant change per MCID.  Baseline: Goal status: New Pt will reduce pain to overall less than 1-2/10 with usual activity and work activity. Baseline: Goal status: New      5. Pt will return to recreational activities  without significant pain or limitations.   Baseline:   Goal Status: New  PLAN: PT FREQUENCY: 1-2 x per week.   PT DURATION: 8-12 weeks.   PLANNED INTERVENTIONS (unless contraindicated): aquatic PT, Canalith repositioning, cryotherapy, Electrical stimulation, Iontophoresis with 4 mg/ml dexamethasome, Moist heat, traction,  Ultrasound, gait training, Therapeutic exercise, balance training, neuromuscular re-education, patient/family education, prosthetic training, manual techniques, passive ROM, dry needling, taping, vasopnuematic device, vestibular, spinal manipulations, joint manipulations  PLAN FOR NEXT SESSION: review/update HEP, progress per protocol.   Mayer Camel, PTA 01/09/24 12:07 PM [facility] [address] [place], [place], [address] Phone: [phone]   Fax:  [phone]

## 2024-01-14 ENCOUNTER — Ambulatory Visit (HOSPITAL_BASED_OUTPATIENT_CLINIC_OR_DEPARTMENT_OTHER): Admitting: Physical Therapy

## 2024-01-14 ENCOUNTER — Encounter (HOSPITAL_BASED_OUTPATIENT_CLINIC_OR_DEPARTMENT_OTHER): Payer: Self-pay | Admitting: Physical Therapy

## 2024-01-14 DIAGNOSIS — M7582 Other shoulder lesions, left shoulder: Secondary | ICD-10-CM | POA: Diagnosis not present

## 2024-01-14 DIAGNOSIS — R293 Abnormal posture: Secondary | ICD-10-CM

## 2024-01-14 DIAGNOSIS — R252 Cramp and spasm: Secondary | ICD-10-CM

## 2024-01-14 DIAGNOSIS — J3081 Allergic rhinitis due to animal (cat) (dog) hair and dander: Secondary | ICD-10-CM | POA: Diagnosis not present

## 2024-01-14 DIAGNOSIS — J3089 Other allergic rhinitis: Secondary | ICD-10-CM | POA: Diagnosis not present

## 2024-01-14 DIAGNOSIS — M6281 Muscle weakness (generalized): Secondary | ICD-10-CM

## 2024-01-14 NOTE — Therapy (Signed)
 OUTPATIENT PHYSICAL THERAPY SHOULDER TREATMENT   Patient Name: Brianna Gates MRN: [id number] DOB:21-Jun-1951, 73 y.o., female Today's Date: 01/14/2024  END OF SESSION:  PT End of Session - 01/14/24 1444     Visit Number 5    Date for PT Re-Evaluation 03/14/24    Authorization Type [employer]    Progress Note Due on Visit 10    PT Start Time 1445    PT Stop Time 1525    PT Time Calculation (min) 40 min    Behavior During Therapy [facility] for tasks assessed/performed             Past Medical History:  Diagnosis Date   Allergy    Anxiety    Asthma    INFREQUENT PROBLEM - rarely used inhaler   CAD (coronary artery disease) 06/29/2023   CCTA 06/27/23: CAC score 15.7 (45th percentile), mild nonobstructive CAD [mid LAD 1-24, proximal LCx 1-24]    Cervical spondylosis    Cervical strain 07/22/2014   Chest pain of uncertain etiology 12/13/2021   Degenerative arthritis    Dyslipidemia    Eczema    GERD (gastroesophageal reflux disease)    Guillain-Barre syndrome (HCC)    History of Guillain-Barre syndrome 07/22/2014   Hyperlipidemia    Hypertension    Hypothyroidism    Lumbosacral spondylosis    Palpitations 12/12/2021   Pure hypercholesterolemia 12/13/2021   Raynaud disease    Thyroid nodule    Past Surgical History:  Procedure Laterality Date   ABDOMINAL HYSTERECTOMY     COLONOSCOPY     ESOPHAGOGASTRODUODENOSCOPY (EGD) WITH PROPOFOL N/A 09/13/2021   Procedure: ESOPHAGOGASTRODUODENOSCOPY (EGD) WITH PROPOFOL;  Surgeon: Hilarie Fredrickson, MD;  Location: [facility];  Service: Endoscopy;  Laterality: N/A;   FOOT SURGERY Left    GANGLION CYST EXCISION Left    Wrist   HERNIA REPAIR     KNEE ARTHROSCOPY     RT KNEE   KNEE ARTHROTOMY Right 08/27/2013   Procedure: RIGHT KNEE SCAR EXCISION AND FEMORAL REVISION;  Surgeon: Loanne Drilling, MD;  Location: [facility];  Service: Orthopedics;  Laterality: Right;   REPLACEMENT TOTAL KNEE Right    2014, 2021   REPLACEMENT TOTAL  KNEE BILATERAL Bilateral    SHOULDER ARTHROSCOPY WITH DISTAL CLAVICLE RESECTION Left 12/18/2023   Procedure: LEFT SHOULDER ARTHROSCOPY WITH EXTENSIVE DEBRIDEMENT, DISTAL CLAVICLE RESECTION / ACROMIOPLASTY;  Surgeon: Huel Cote, MD;  Location: [facility];  Service: Orthopedics;  Laterality: Left;   STRABISMUS SURGERY Right    TONSILLECTOMY     vaginal vulvo prolapse  09/16/2015   Patient Active Problem List   Diagnosis Date Noted   Tendinitis of left rotator cuff 12/18/2023   Arthritis of left acromioclavicular joint 12/18/2023   CAD (coronary artery disease) 06/29/2023   Paresthesia 10/18/2022   Onychomycosis 01/29/2022   Chest pain of uncertain etiology 12/13/2021   Pure hypercholesterolemia 12/13/2021   Palpitations 12/12/2021   Esophageal dysphagia    Allergic rhinitis 04/04/2021   Allergic rhinitis due to animal (cat) (dog) hair and dander 04/04/2021   Allergic rhinitis due to pollen 04/04/2021   Hormone deficiency 04/04/2021   Syncope and collapse 12/27/2020   Hypotension 12/27/2020   Bradycardia 12/27/2020   History of COVID-19 12/27/2020   Preoperative evaluation to rule out surgical contraindication 07/16/2020   Arthrofibrosis of knee joint, right 11/26/2019   Arthritis of knee 07/30/2019   Presence of right artificial knee joint 07/30/2019   Sleep apnea 10/16/2018   Shoulder impingement syndrome  01/15/2018   Gastroesophageal reflux disease 02/02/2016   Sensorineural hearing loss (SNHL), bilateral 02/02/2016   Vaginal vault prolapse after hysterectomy 07/28/2015   History of Guillain-Barre syndrome 07/22/2014   Cervical strain 07/22/2014   Postoperative stiffness of total knee replacement (HCC) 08/27/2013   Abnormality of gait 08/22/2012   Cervical spondylosis without myelopathy 08/22/2012   Muscle weakness (generalized) 08/22/2012   Guillain-Barre syndrome (HCC) 08/22/2012   Essential hypertension 08/22/2012   Strabismus 08/22/2012   Degenerative  joint disease of knee, left 08/22/2012   Hypothyroidism 08/22/2012   Raynaud's disease 08/22/2012   Knee joint replacement by other means 08/22/2012    PCP: Chilton Greathouse, MD  REFERRING PROVIDER: Huel Cote, MD  REFERRING DIAG: Tendinitis of left rotator cuff [M75.82]  Arthroscopic extensive debridement - 29823 Supraspinatus Tendon, Anterior Labrum, Superior Labrum, and Posterior Labrum Arthroscopic distal clavicle excision - 62952 Arthroscopic subacromial decompression - 84132  THERAPY DIAG:  Abnormal posture  Cramp and spasm  Muscle weakness (generalized)  Rationale for Evaluation and Treatment: Rehabilitation  ONSET DATE: DOS 12/18/2023  SUBJECTIVE:                                                                                                                                                                                      SUBJECTIVE STATEMENT: Midline lower cervical region and under Lt shoulder blade is really tight.    Initial evaluation:  Pt presents to PT post op L shoulder arthroscopy with extensive debridement, distal clavicle resection/ acromioplasty. She reports some mild pain in her L shoulder since surgery but thinks it is due to the incisions rubbing against the dressing. She denies any issues with sleeping. She continues to have numbness in her fingers from her recent nerve block. Prior to sx, she was unable to reach [facility], out to the side, and had difficulty dressing her bed.   Hand dominance: Right  PERTINENT HISTORY: HTN, GERD, Gullian- Barre syndrome.   PAIN:  Are you having pain? no: NPRS scale: 0 Pain location:  Pain description: Aggravating factors:  Relieving factors:    PRECAUTIONS: Shoulder  RED FLAGS: None   WEIGHT BEARING RESTRICTIONS: No  FALLS:  Has patient fallen in last 6 months? No  LIVING ENVIRONMENT: Lives with: lives with their spouse Lives in: House/apartment Stairs: Yes: Internal: 1 flight steps; on right going  up and External: 4-5 steps; bilateral but cannot reach both Has following equipment at home: shower chair and Grab bars  OCCUPATION: Retired   PLOF: Independent  PATIENT GOALS: Pt would like to be able to get back to functional mobility and get back to working out.   NEXT MD VISIT: 12/26/2023  OBJECTIVE:  Note: Objective measures were completed at Evaluation unless otherwise noted.  DIAGNOSTIC FINDINGS:  1. Mild supraspinatus tendinosis with a tiny insertional interstitial tear. 2. Moderate infraspinatus tendinosis.    PATIENT SURVEYS:  Neldon Mc Next session.   COGNITION: Overall cognitive status: Within functional limits for tasks assessed     SENSATION: WFL  POSTURE: Pt is present in sling.   UPPER EXTREMITY ROM:   Passive ROM Right eval Left eval Left  01/09/24  Shoulder flexion   AAROM supine with cane, 155  Shoulder extension     Shoulder abduction     Shoulder adduction     Shoulder internal rotation     Shoulder external rotation     Elbow flexion     Elbow extension     Wrist flexion     Wrist extension     Wrist ulnar deviation     Wrist radial deviation     Wrist pronation     Wrist supination     (Blank rows = not tested)  UPPER EXTREMITY MMT:  MMT Right eval Left eval  Shoulder flexion    Shoulder extension    Shoulder abduction    Shoulder adduction    Shoulder internal rotation    Shoulder external rotation    Middle trapezius    Lower trapezius    Elbow flexion    Elbow extension    Wrist flexion    Wrist extension    Wrist ulnar deviation    Wrist radial deviation    Wrist pronation    Wrist supination    Grip strength (lbs)    (Blank rows = not tested)  JOINT MOBILITY TESTING:  NT today  PALPATION:  No tenderness to surrounding areas.                                                                                                                              TREATMENT Treatment                             3/31: Blank lines following charge title = not provided on this treatment date.   Manual:  TPDN YES Trigger Point Dry Needling  Initial Treatment: Pt instructed on Dry Needling rational, procedures, and possible side effects. Pt instructed to expect mild to moderate muscle soreness later in the day and/or into the next day.  Pt instructed in methods to reduce muscle soreness. Pt instructed to continue prescribed HEP. Because Dry Needling was performed over or adjacent to a lung field, pt was educated on S/S of pneumothorax and to seek immediate medical attention should they occur.  Patient was educated on signs and symptoms of infection and other risk factors and advised to seek medical attention should they occur.  Patient verbalized understanding of these instructions and education.   Patient Verbal Consent Given: Yes- Education Handout Provided: Previously Provided- had DN at [place] [facility]  Treated: bilateral upper trap, Lt rhomboids Electrical Stimulation Performed: No Treatment Response/Outcome: twitch with decreased concordant pain Prone gross rib mobilization There-ex: Supine breathing to ab set with exhale Hooklying shoulder flexion with breathing coordinated Hooklying ab set with GHJ ER Seated AROM GHJ ER Seated forward reach to OH flexion, cues for scap retraction upon return There-Act:  Self Care:  Nuro-Re-ed: Ab engagement to reduce rib cage flare Seated posture into post pelvic tilt Gait Training:    [facility] Adult PT Treatment:                                                DATE: 01/09/24  Therapeutic Exercise: Pulleys into L shoulder flexion and abdct, with tactile cues to not elevate scapula, and neutral head position Cane AAROM in standing - into shoulder ext x 10, into shoulder IR Cane AAROM in supine- bilat shoulder flexion x 10 Star gazer stretch x 30s x 2 Snow angels to tolerance  Manual Therapy: STM to L thoracic paraspinals, upper traps, and levator  to decrease fascial tightness   Treatment                            3/17: Blank lines following charge title = not provided on this treatment date.   Manual:  TPDN No STM to left upper trap There-ex: Pendulum Flexion roll on foam roll Seated posture for core + scapular retraction, + shoulder rolls Retraction + upper trap stretch Retraction + biceps curls full range--> progressed to modified median nerve glide Seated AROM row motion, short arc overhead reach There-Act:  Self Care:  Nuro-Re-ed:  Gait Training:        PATIENT EDUCATION: Education details: HEP,  and POC. Person educated: Patient Education method: Medical illustrator, handout Education comprehension: verbalized understanding and returned demonstration  HOME EXERCISE PROGRAM: Access Code: [email or web address] URL: [email or web address]   ASSESSMENT:  CLINICAL IMPRESSION:   Scapular dyskinesis notable on left side with bilateral rib cage flare-Lt more notable than Rt. Will benefit from core stability with shoulder challenges.    Initial impression: Patient referred to PT for s/p L shoulder arthroscopy with extensive debridement, distal clavicle resection/ acromioplasty. Changed dressings today. Surgical site appears clean with no redness or major swelling noted. She denies any significant pain. Pt provided initial HEP. Patient will benefit from skilled PT to address below impairments, limitations and improve overall function.  OBJECTIVE IMPAIRMENTS: decreased activity tolerance, decreased shoulder mobility, decreased ROM, decreased strength, impaired flexibility, impaired UE use, postural dysfunction, and pain.  ACTIVITY LIMITATIONS: reaching, lifting, carry,  cleaning, driving, and or occupation  PERSONAL FACTORS:  also affecting patient's functional outcome.  REHAB POTENTIAL: Good  CLINICAL DECISION MAKING: Stable/uncomplicated  EVALUATION COMPLEXITY: Low    GOALS: Short term  PT Goals Target date: 02/17/2024 Pt will be I and compliant with HEP. Baseline:  Goal status: New Pt will decrease pain by 25% overall Baseline: Goal status: New  Long term PT goals Target date: 03/14/2024 Pt will improve Lt shoulder AROM to [facility] to improve functional reaching Baseline: Goal status: New Pt will improve  Lt shoulder strength to at least 4+/5 MMT to improve functional strength Baseline: Goal status: New Pt will improve Quick Dash by 14 points to have significant change per MCID.  Baseline: Goal status: New Pt will reduce pain to  overall less than 1-2/10 with usual activity and work activity. Baseline: Goal status: New      5. Pt will return to recreational activities without significant pain or limitations.   Baseline:   Goal Status: New  PLAN: PT FREQUENCY: 1-2 x per week.   PT DURATION: 8-12 weeks.   PLANNED INTERVENTIONS (unless contraindicated): aquatic PT, Canalith repositioning, cryotherapy, Electrical stimulation, Iontophoresis with 4 mg/ml dexamethasome, Moist heat, traction, Ultrasound, gait training, Therapeutic exercise, balance training, neuromuscular re-education, patient/family education, prosthetic training, manual techniques, passive ROM, dry needling, taping, vasopnuematic device, vestibular, spinal manipulations, joint manipulations  PLAN FOR NEXT SESSION: review/update HEP, progress per protocol.   Normalee Sistare C. Hutchinson Isenberg PT, DPT 01/14/24 3:26 PM  [facility] [address] [place], [place], [address] Phone: [phone]   Fax:  [phone]

## 2024-01-16 DIAGNOSIS — F411 Generalized anxiety disorder: Secondary | ICD-10-CM | POA: Diagnosis not present

## 2024-01-17 ENCOUNTER — Ambulatory Visit (HOSPITAL_BASED_OUTPATIENT_CLINIC_OR_DEPARTMENT_OTHER): Attending: Orthopaedic Surgery

## 2024-01-17 ENCOUNTER — Encounter (HOSPITAL_BASED_OUTPATIENT_CLINIC_OR_DEPARTMENT_OTHER): Payer: Self-pay

## 2024-01-17 DIAGNOSIS — R293 Abnormal posture: Secondary | ICD-10-CM | POA: Diagnosis not present

## 2024-01-17 DIAGNOSIS — M6281 Muscle weakness (generalized): Secondary | ICD-10-CM | POA: Diagnosis not present

## 2024-01-17 DIAGNOSIS — R252 Cramp and spasm: Secondary | ICD-10-CM | POA: Diagnosis not present

## 2024-01-17 DIAGNOSIS — M25512 Pain in left shoulder: Secondary | ICD-10-CM | POA: Diagnosis not present

## 2024-01-17 NOTE — Therapy (Signed)
 OUTPATIENT PHYSICAL THERAPY SHOULDER TREATMENT   Patient Name: Brianna Gates MRN: [id number] DOB:Feb 02, 1951, 73 y.o., female Today's Date: 01/17/2024  END OF SESSION:  PT End of Session - 01/17/24 1527     Visit Number 6    Date for PT Re-Evaluation 03/14/24    Authorization Type [employer]    Progress Note Due on Visit 10    PT Start Time 1523    PT Stop Time 1601    PT Time Calculation (min) 38 min    Activity Tolerance Patient tolerated treatment well    Behavior During Therapy WFL for tasks assessed/performed              Past Medical History:  Diagnosis Date   Allergy    Anxiety    Asthma    INFREQUENT PROBLEM - rarely used inhaler   CAD (coronary artery disease) 06/29/2023   CCTA 06/27/23: CAC score 15.7 (45th percentile), mild nonobstructive CAD [mid LAD 1-24, proximal LCx 1-24]    Cervical spondylosis    Cervical strain 07/22/2014   Chest pain of uncertain etiology 12/13/2021   Degenerative arthritis    Dyslipidemia    Eczema    GERD (gastroesophageal reflux disease)    Guillain-Barre syndrome (HCC)    History of Guillain-Barre syndrome 07/22/2014   Hyperlipidemia    Hypertension    Hypothyroidism    Lumbosacral spondylosis    Palpitations 12/12/2021   Pure hypercholesterolemia 12/13/2021   Raynaud disease    Thyroid nodule    Past Surgical History:  Procedure Laterality Date   ABDOMINAL HYSTERECTOMY     COLONOSCOPY     ESOPHAGOGASTRODUODENOSCOPY (EGD) WITH PROPOFOL N/A 09/13/2021   Procedure: ESOPHAGOGASTRODUODENOSCOPY (EGD) WITH PROPOFOL;  Surgeon: Hilarie Fredrickson, MD;  Location: [facility];  Service: Endoscopy;  Laterality: N/A;   FOOT SURGERY Left    GANGLION CYST EXCISION Left    Wrist   HERNIA REPAIR     KNEE ARTHROSCOPY     RT KNEE   KNEE ARTHROTOMY Right 08/27/2013   Procedure: RIGHT KNEE SCAR EXCISION AND FEMORAL REVISION;  Surgeon: Loanne Drilling, MD;  Location: [facility];  Service: Orthopedics;  Laterality: Right;    REPLACEMENT TOTAL KNEE Right    2014, 2021   REPLACEMENT TOTAL KNEE BILATERAL Bilateral    SHOULDER ARTHROSCOPY WITH DISTAL CLAVICLE RESECTION Left 12/18/2023   Procedure: LEFT SHOULDER ARTHROSCOPY WITH EXTENSIVE DEBRIDEMENT, DISTAL CLAVICLE RESECTION / ACROMIOPLASTY;  Surgeon: Huel Cote, MD;  Location:  [facility];  Service: Orthopedics;  Laterality: Left;   STRABISMUS SURGERY Right    TONSILLECTOMY     vaginal vulvo prolapse  09/16/2015   Patient Active Problem List   Diagnosis Date Noted   Tendinitis of left rotator cuff 12/18/2023   Arthritis of left acromioclavicular joint 12/18/2023   CAD (coronary artery disease) 06/29/2023   Paresthesia 10/18/2022   Onychomycosis 01/29/2022   Chest pain of uncertain etiology 12/13/2021   Pure hypercholesterolemia 12/13/2021   Palpitations 12/12/2021   Esophageal dysphagia    Allergic rhinitis 04/04/2021   Allergic rhinitis due to animal (cat) (dog) hair and dander 04/04/2021   Allergic rhinitis due to pollen 04/04/2021   Hormone deficiency 04/04/2021   Syncope and collapse 12/27/2020   Hypotension 12/27/2020   Bradycardia 12/27/2020   History of COVID-19 12/27/2020   Preoperative evaluation to rule out surgical contraindication 07/16/2020   Arthrofibrosis of knee joint, right 11/26/2019   Arthritis of knee 07/30/2019   Presence of right artificial knee joint 07/30/2019  Sleep apnea 10/16/2018   Shoulder impingement syndrome 01/15/2018   Gastroesophageal reflux disease 02/02/2016   Sensorineural hearing loss (SNHL), bilateral 02/02/2016   Vaginal vault prolapse after hysterectomy 07/28/2015   History of Guillain-Barre syndrome 07/22/2014   Cervical strain 07/22/2014   Postoperative stiffness of total knee replacement (HCC) 08/27/2013   Abnormality of gait 08/22/2012   Cervical spondylosis without myelopathy 08/22/2012   Muscle weakness (generalized) 08/22/2012   Guillain-Barre syndrome (HCC) 08/22/2012   Essential  hypertension 08/22/2012   Strabismus 08/22/2012   Degenerative joint disease of knee, left 08/22/2012   Hypothyroidism 08/22/2012   Raynaud's disease 08/22/2012   Knee joint replacement by other means 08/22/2012    PCP: Chilton Greathouse, MD  REFERRING PROVIDER: Huel Cote, MD  REFERRING DIAG: Tendinitis of left rotator cuff [M75.82]  Arthroscopic extensive debridement - 29823 Supraspinatus Tendon, Anterior Labrum, Superior Labrum, and Posterior Labrum Arthroscopic distal clavicle excision - 08657 Arthroscopic subacromial decompression - 84696  THERAPY DIAG:  Abnormal posture  Cramp and spasm  Muscle weakness (generalized)  Acute pain of left shoulder  Rationale for Evaluation and Treatment: Rehabilitation  ONSET DATE: DOS 12/18/2023  SUBJECTIVE:                                                                                                                                                                                      SUBJECTIVE STATEMENT: Pt reports she is unsure if she is doing some of her HEP correctly. Shoulder blade feels okay currently. Reached for some thing earlier today which bothered her.    Initial evaluation:  Pt presents to PT post op L shoulder arthroscopy with extensive debridement, distal clavicle resection/ acromioplasty. She reports some mild pain in her L shoulder since surgery but thinks it is due to the incisions rubbing against the dressing. She denies any issues with sleeping. She continues to have numbness in her fingers from her recent nerve block. Prior to sx, she was unable to reach [facility], out to the side, and had difficulty dressing her bed.   Hand dominance: Right  PERTINENT HISTORY: HTN, GERD, Gullian- Barre syndrome.   PAIN:  Are you having pain? no: NPRS scale: 0 Pain location:  Pain description: Aggravating factors:  Relieving factors:    PRECAUTIONS: Shoulder  RED FLAGS: None   WEIGHT BEARING RESTRICTIONS: No  FALLS:   Has patient fallen in last 6 months? No  LIVING ENVIRONMENT: Lives with: lives with their spouse Lives in: House/apartment Stairs: Yes: Internal: 1 flight steps; on right going up and External: 4-5 steps; bilateral but cannot reach both Has following equipment at home: shower chair and Grab bars  OCCUPATION: Retired   PLOF:  Independent  PATIENT GOALS: Pt would like to be able to get back to functional mobility and get back to working out.   NEXT MD VISIT: 12/26/2023  OBJECTIVE:  Note: Objective measures were completed at Evaluation unless otherwise noted.  DIAGNOSTIC FINDINGS:  1. Mild supraspinatus tendinosis with a tiny insertional interstitial tear. 2. Moderate infraspinatus tendinosis.    PATIENT SURVEYS:  Neldon Mc Next session.   COGNITION: Overall cognitive status: Within functional limits for tasks assessed     SENSATION: WFL  POSTURE: Pt is present in sling.   UPPER EXTREMITY ROM:   Passive ROM Right eval Left eval Left  01/09/24  Shoulder flexion   AAROM supine with cane, 155  Shoulder extension     Shoulder abduction     Shoulder adduction     Shoulder internal rotation     Shoulder external rotation     Elbow flexion     Elbow extension     Wrist flexion     Wrist extension     Wrist ulnar deviation     Wrist radial deviation     Wrist pronation     Wrist supination     (Blank rows = not tested)  UPPER EXTREMITY MMT:  MMT Right eval Left eval  Shoulder flexion    Shoulder extension    Shoulder abduction    Shoulder adduction    Shoulder internal rotation    Shoulder external rotation    Middle trapezius    Lower trapezius    Elbow flexion    Elbow extension    Wrist flexion    Wrist extension    Wrist ulnar deviation    Wrist radial deviation    Wrist pronation    Wrist supination    Grip strength (lbs)    (Blank rows = not tested)  JOINT MOBILITY TESTING:  NT today  PALPATION:  No tenderness to surrounding areas.                                                                                                                               TREATMENT    Treatment                            01/17/24: Blank lines following charge title = not provided on this treatment date.   Manual:  TPDN No PROM L shoulder STM to lats There-ex: Supine shoulder flexion (cues for full range) Sidelying abduction (cues for full range) Sidelying ER 2x10 Standing bicep curls 3# bilateral x15 Theraband tricep extension RTB 2x10 Theraband row RTB 2x10 Review of HEP and proper performance   Treatment                            3/31: Blank lines following charge title = not provided on this treatment date.   Manual:  TPDN YES Trigger  Point Dry Needling  Initial Treatment: Pt instructed on Dry Needling rational, procedures, and possible side effects. Pt instructed to expect mild to moderate muscle soreness later in the day and/or into the next day.  Pt instructed in methods to reduce muscle soreness. Pt instructed to continue prescribed HEP. Because Dry Needling was performed over or adjacent to a lung field, pt was educated on S/S of pneumothorax and to seek immediate medical attention should they occur.  Patient was educated on signs and symptoms of infection and other risk factors and advised to seek medical attention should they occur.  Patient verbalized understanding of these instructions and education.   Patient Verbal Consent Given: Yes- Education Handout Provided: Previously Provided- had DN at [facility] Muscles Treated: bilateral upper trap, Lt rhomboids Electrical Stimulation Performed: No Treatment Response/Outcome: twitch with decreased concordant pain Prone gross rib mobilization There-ex: Supine breathing to ab set with exhale Hooklying shoulder flexion with breathing coordinated Hooklying ab set with GHJ ER Seated AROM GHJ ER Seated forward reach to OH flexion, cues for scap retraction upon  return There-Act:  Self Care:  Nuro-Re-ed: Ab engagement to reduce rib cage flare Seated posture into post pelvic tilt Gait Training:    [facility] Adult PT Treatment:                                                DATE: 01/09/24  Therapeutic Exercise: Pulleys into L shoulder flexion and abdct, with tactile cues to not elevate scapula, and neutral head position Cane AAROM in standing - into shoulder ext x 10, into shoulder IR Cane AAROM in supine- bilat shoulder flexion x 10 Star gazer stretch x 30s x 2 Snow angels to tolerance  Manual Therapy: STM to L thoracic paraspinals, upper traps, and levator to decrease fascial tightness   Treatment                            3/17: Blank lines following charge title = not provided on this treatment date.   Manual:  TPDN No STM to left upper trap There-ex: Pendulum Flexion roll on foam roll Seated posture for core + scapular retraction, + shoulder rolls Retraction + upper trap stretch Retraction + biceps curls full range--> progressed to modified median nerve glide Seated AROM row motion, short arc overhead reach There-Act:  Self Care:  Nuro-Re-ed:  Gait Training:        PATIENT EDUCATION: Education details: HEP,  and POC. Person educated: Patient Education method: Medical illustrator, handout Education comprehension: verbalized understanding and returned demonstration  HOME EXERCISE PROGRAM: Access Code: [email or web address] URL: [email or web address]   ASSESSMENT:   Excellent ROM available, though scapular dyskinesia evident with L elevation. She denied pain with exercises, though did report mild fatigue. She was educated on proper performance of HEP and to avoid over using L UE at this time. Will update HEP next visit if good response to today's progressions.    CLINICAL IMPRESSION:   Scapular dyskinesis notable on left side with bilateral rib cage flare-Lt more notable than Rt. Will benefit from  core stability with shoulder challenges.    Initial impression: Patient referred to PT for s/p L shoulder arthroscopy with extensive debridement, distal clavicle resection/ acromioplasty. Changed dressings today. Surgical site appears clean with no redness or major swelling noted. She denies any  significant pain. Pt provided initial HEP. Patient will benefit from skilled PT to address below impairments, limitations and improve overall function.  OBJECTIVE IMPAIRMENTS: decreased activity tolerance, decreased shoulder mobility, decreased ROM, decreased strength, impaired flexibility, impaired UE use, postural dysfunction, and pain.  ACTIVITY LIMITATIONS: reaching, lifting, carry,  cleaning, driving, and or occupation  PERSONAL FACTORS:  also affecting patient's functional outcome.  REHAB POTENTIAL: Good  CLINICAL DECISION MAKING: Stable/uncomplicated  EVALUATION COMPLEXITY: Low    GOALS: Short term PT Goals Target date: 02/17/2024 Pt will be I and compliant with HEP. Baseline:  Goal status: New Pt will decrease pain by 25% overall Baseline: Goal status: New  Long term PT goals Target date: 03/14/2024 Pt will improve Lt shoulder AROM to [facility] to improve functional reaching Baseline: Goal status: New Pt will improve  Lt shoulder strength to at least 4+/5 MMT to improve functional strength Baseline: Goal status: New Pt will improve Quick Dash by 14 points to have significant change per MCID.  Baseline: Goal status: New Pt will reduce pain to overall less than 1-2/10 with usual activity and work activity. Baseline: Goal status: New      5. Pt will return to recreational activities without significant pain or limitations.   Baseline:   Goal Status: New  PLAN: PT FREQUENCY: 1-2 x per week.   PT DURATION: 8-12 weeks.   PLANNED INTERVENTIONS (unless contraindicated): aquatic PT, Canalith repositioning, cryotherapy, Electrical stimulation, Iontophoresis with 4 mg/ml  dexamethasome, Moist heat, traction, Ultrasound, gait training, Therapeutic exercise, balance training, neuromuscular re-education, patient/family education, prosthetic training, manual techniques, passive ROM, dry needling, taping, vasopnuematic device, vestibular, spinal manipulations, joint manipulations  PLAN FOR NEXT SESSION: review/update HEP, progress per protocol.    Lenore Manner, PTA 01/17/2024, 5:09 PM   [facility] [address] [place], [place], [address] Phone: [phone]   Fax:  [phone]

## 2024-01-21 ENCOUNTER — Encounter (HOSPITAL_BASED_OUTPATIENT_CLINIC_OR_DEPARTMENT_OTHER): Payer: Self-pay | Admitting: Physical Therapy

## 2024-01-21 ENCOUNTER — Ambulatory Visit (HOSPITAL_BASED_OUTPATIENT_CLINIC_OR_DEPARTMENT_OTHER): Admitting: Physical Therapy

## 2024-01-21 DIAGNOSIS — M6281 Muscle weakness (generalized): Secondary | ICD-10-CM | POA: Diagnosis not present

## 2024-01-21 DIAGNOSIS — R252 Cramp and spasm: Secondary | ICD-10-CM | POA: Diagnosis not present

## 2024-01-21 DIAGNOSIS — J3089 Other allergic rhinitis: Secondary | ICD-10-CM | POA: Diagnosis not present

## 2024-01-21 DIAGNOSIS — R293 Abnormal posture: Secondary | ICD-10-CM

## 2024-01-21 DIAGNOSIS — J3081 Allergic rhinitis due to animal (cat) (dog) hair and dander: Secondary | ICD-10-CM | POA: Diagnosis not present

## 2024-01-21 DIAGNOSIS — M25512 Pain in left shoulder: Secondary | ICD-10-CM | POA: Diagnosis not present

## 2024-01-21 DIAGNOSIS — J301 Allergic rhinitis due to pollen: Secondary | ICD-10-CM | POA: Diagnosis not present

## 2024-01-21 NOTE — Therapy (Signed)
 OUTPATIENT PHYSICAL THERAPY SHOULDER TREATMENT   Patient Name: Brianna Gates MRN: [id number] DOB:01/14/1951, 73 y.o., female Today's Date: 01/21/2024  END OF SESSION:  PT End of Session - 01/21/24 1409     Visit Number 7    Date for PT Re-Evaluation 03/14/24    Authorization Type [employer]    Progress Note Due on Visit 10    PT Start Time 1407    PT Stop Time 1442    PT Time Calculation (min) 35 min    Activity Tolerance Patient tolerated treatment well    Behavior During Therapy [facility] for tasks assessed/performed               Past Medical History:  Diagnosis Date   Allergy    Anxiety    Asthma    INFREQUENT PROBLEM - rarely used inhaler   CAD (coronary artery disease) 06/29/2023   CCTA 06/27/23: CAC score 15.7 (45th percentile), mild nonobstructive CAD [mid LAD 1-24, proximal LCx 1-24]    Cervical spondylosis    Cervical strain 07/22/2014   Chest pain of uncertain etiology 12/13/2021   Degenerative arthritis    Dyslipidemia    Eczema    GERD (gastroesophageal reflux disease)    Guillain-Barre syndrome (HCC)    History of Guillain-Barre syndrome 07/22/2014   Hyperlipidemia    Hypertension    Hypothyroidism    Lumbosacral spondylosis    Palpitations 12/12/2021   Pure hypercholesterolemia 12/13/2021   Raynaud disease    Thyroid nodule    Past Surgical History:  Procedure Laterality Date   ABDOMINAL HYSTERECTOMY     COLONOSCOPY     ESOPHAGOGASTRODUODENOSCOPY (EGD) WITH PROPOFOL N/A 09/13/2021   Procedure: ESOPHAGOGASTRODUODENOSCOPY (EGD) WITH PROPOFOL;  Surgeon: Hilarie Fredrickson, MD;  Location: [facility];  Service: Endoscopy;  Laterality: N/A;   FOOT SURGERY Left    GANGLION CYST EXCISION Left    Wrist   HERNIA REPAIR     KNEE ARTHROSCOPY     RT KNEE   KNEE ARTHROTOMY Right 08/27/2013   Procedure: RIGHT KNEE SCAR EXCISION AND FEMORAL REVISION;  Surgeon: Loanne Drilling, MD;  Location: [facility];  Service: Orthopedics;  Laterality: Right;    REPLACEMENT TOTAL KNEE Right    2014, 2021   REPLACEMENT TOTAL KNEE BILATERAL Bilateral    SHOULDER ARTHROSCOPY WITH DISTAL CLAVICLE RESECTION Left 12/18/2023   Procedure: LEFT SHOULDER ARTHROSCOPY WITH EXTENSIVE DEBRIDEMENT, DISTAL CLAVICLE RESECTION / ACROMIOPLASTY;  Surgeon: Huel Cote, MD;  Location: [facility];  Service: Orthopedics;  Laterality: Left;   STRABISMUS SURGERY Right    TONSILLECTOMY     vaginal vulvo prolapse  09/16/2015   Patient Active Problem List   Diagnosis Date Noted   Tendinitis of left rotator cuff 12/18/2023   Arthritis of left acromioclavicular joint 12/18/2023   CAD (coronary artery disease) 06/29/2023   Paresthesia 10/18/2022   Onychomycosis 01/29/2022   Chest pain of uncertain etiology 12/13/2021   Pure hypercholesterolemia 12/13/2021   Palpitations 12/12/2021   Esophageal dysphagia    Allergic rhinitis 04/04/2021   Allergic rhinitis due to animal (cat) (dog) hair and dander 04/04/2021   Allergic rhinitis due to pollen 04/04/2021   Hormone deficiency 04/04/2021   Syncope and collapse 12/27/2020   Hypotension 12/27/2020   Bradycardia 12/27/2020   History of COVID-19 12/27/2020   Preoperative evaluation to rule out surgical contraindication 07/16/2020   Arthrofibrosis of knee joint, right 11/26/2019   Arthritis of knee 07/30/2019   Presence of right artificial knee joint  07/30/2019   Sleep apnea 10/16/2018   Shoulder impingement syndrome 01/15/2018   Gastroesophageal reflux disease 02/02/2016   Sensorineural hearing loss (SNHL), bilateral 02/02/2016   Vaginal vault prolapse after hysterectomy 07/28/2015   History of Guillain-Barre syndrome 07/22/2014   Cervical strain 07/22/2014   Postoperative stiffness of total knee replacement (HCC) 08/27/2013   Abnormality of gait 08/22/2012   Cervical spondylosis without myelopathy 08/22/2012   Muscle weakness (generalized) 08/22/2012   Guillain-Barre syndrome (HCC) 08/22/2012   Essential  hypertension 08/22/2012   Strabismus 08/22/2012   Degenerative joint disease of knee, left 08/22/2012   Hypothyroidism 08/22/2012   Raynaud's disease 08/22/2012   Knee joint replacement by other means 08/22/2012    PCP: Chilton Greathouse, MD  REFERRING PROVIDER: Huel Cote, MD  REFERRING DIAG: Tendinitis of left rotator cuff [M75.82]  Arthroscopic extensive debridement - 29823 Supraspinatus Tendon, Anterior Labrum, Superior Labrum, and Posterior Labrum Arthroscopic distal clavicle excision - 16109 Arthroscopic subacromial decompression - 60454  THERAPY DIAG:  Abnormal posture  Cramp and spasm  Muscle weakness (generalized)  Rationale for Evaluation and Treatment: Rehabilitation  ONSET DATE: DOS 12/18/2023  SUBJECTIVE:                                                                                                                                                                                      SUBJECTIVE STATEMENT: Still having a really hard time with supine shoulder flexion. I slept on the shoulder funny so it was achey into the arm.    Initial evaluation:  Pt presents to PT post op L shoulder arthroscopy with extensive debridement, distal clavicle resection/ acromioplasty. She reports some mild pain in her L shoulder since surgery but thinks it is due to the incisions rubbing against the dressing. She denies any issues with sleeping. She continues to have numbness in her fingers from her recent nerve block. Prior to sx, she was unable to reach [facility], out to the side, and had difficulty dressing her bed.   Hand dominance: Right  PERTINENT HISTORY: HTN, GERD, Gullian- Barre syndrome.   PAIN:  Are you having pain? no: NPRS scale: 0 Pain location:  Pain description: Aggravating factors:  Relieving factors:    PRECAUTIONS: Shoulder  RED FLAGS: None   WEIGHT BEARING RESTRICTIONS: No  FALLS:  Has patient fallen in last 6 months? No  LIVING ENVIRONMENT: Lives  with: lives with their spouse Lives in: House/apartment Stairs: Yes: Internal: 1 flight steps; on right going up and External: 4-5 steps; bilateral but cannot reach both Has following equipment at home: shower chair and Grab bars  OCCUPATION: Retired   PLOF: Independent  PATIENT GOALS: Pt would like to  be able to get back to functional mobility and get back to working out.   NEXT MD VISIT: 12/26/2023  OBJECTIVE:  Note: Objective measures were completed at Evaluation unless otherwise noted.  DIAGNOSTIC FINDINGS:  1. Mild supraspinatus tendinosis with a tiny insertional interstitial tear. 2. Moderate infraspinatus tendinosis.    PATIENT SURVEYS:  Neldon Mc Next session.   COGNITION: Overall cognitive status: Within functional limits for tasks assessed     SENSATION: WFL  POSTURE: Pt is present in sling.   UPPER EXTREMITY ROM:   Passive ROM Right eval Left eval Left  01/09/24  Shoulder flexion   AAROM supine with cane, 155  Shoulder extension     Shoulder abduction     Shoulder adduction     Shoulder internal rotation     Shoulder external rotation     Elbow flexion     Elbow extension     Wrist flexion     Wrist extension     Wrist ulnar deviation     Wrist radial deviation     Wrist pronation     Wrist supination     (Blank rows = not tested)  UPPER EXTREMITY MMT:  MMT Right eval Left eval  Shoulder flexion    Shoulder extension    Shoulder abduction    Shoulder adduction    Shoulder internal rotation    Shoulder external rotation    Middle trapezius    Lower trapezius    Elbow flexion    Elbow extension    Wrist flexion    Wrist extension    Wrist ulnar deviation    Wrist radial deviation    Wrist pronation    Wrist supination    Grip strength (lbs)    (Blank rows = not tested)  JOINT MOBILITY TESTING:  NT today  PALPATION:  No tenderness to surrounding areas.                                                                                                                               TREATMENT  Treatment                            4/7: Blank lines following charge title = not provided on this treatment date.   Manual:  TPDN No STM subscap, lats Cervical musculature on the left- suboccipital release There-ex: Supine flexion with wand- PT blocking scapula Sidelying ER Sidelying abduction Sidelying horizontal abduction There-Act:  Self Care:  Nuro-Re-ed:  Gait Training:    Treatment                            01/17/24: Blank lines following charge title = not provided on this treatment date.   Manual:  TPDN No PROM L shoulder STM to lats There-ex: Supine shoulder flexion (cues for full range) Sidelying abduction (cues for full range)  Sidelying ER 2x10 Standing bicep curls 3# bilateral x15 Theraband tricep extension RTB 2x10 Theraband row RTB 2x10 Review of HEP and proper performance      PATIENT EDUCATION: Education details: HEP,  and POC. Person educated: Patient Education method: Medical illustrator, handout Education comprehension: verbalized understanding and returned demonstration  HOME EXERCISE PROGRAM: Access Code: [email or web address] URL: [email or web address]   ASSESSMENT:   CLINICAL IMPRESSION:   Able to decrease impingement with reduction in cervical muscle tightness and training to reduce shoulder hike.   Initial impression: Patient referred to PT for s/p L shoulder arthroscopy with extensive debridement, distal clavicle resection/ acromioplasty. Changed dressings today. Surgical site appears clean with no redness or major swelling noted. She denies any significant pain. Pt provided initial HEP. Patient will benefit from skilled PT to address below impairments, limitations and improve overall function.  OBJECTIVE IMPAIRMENTS: decreased activity tolerance, decreased shoulder mobility, decreased ROM, decreased strength, impaired flexibility, impaired UE use,  postural dysfunction, and pain.  ACTIVITY LIMITATIONS: reaching, lifting, carry,  cleaning, driving, and or occupation  PERSONAL FACTORS:  also affecting patient's functional outcome.  REHAB POTENTIAL: Good  CLINICAL DECISION MAKING: Stable/uncomplicated  EVALUATION COMPLEXITY: Low    GOALS: Short term PT Goals Target date: 02/17/2024 Pt will be I and compliant with HEP. Baseline:  Goal status: New Pt will decrease pain by 25% overall Baseline: Goal status: New  Long term PT goals Target date: 03/14/2024 Pt will improve Lt shoulder AROM to [facility] to improve functional reaching Baseline: Goal status: New Pt will improve  Lt shoulder strength to at least 4+/5 MMT to improve functional strength Baseline: Goal status: New Pt will improve Quick Dash by 14 points to have significant change per MCID.  Baseline: Goal status: New Pt will reduce pain to overall less than 1-2/10 with usual activity and work activity. Baseline: Goal status: New      5. Pt will return to recreational activities without significant pain or limitations.   Baseline:   Goal Status: New  PLAN: PT FREQUENCY: 1-2 x per week.   PT DURATION: 8-12 weeks.   PLANNED INTERVENTIONS (unless contraindicated): aquatic PT, Canalith repositioning, cryotherapy, Electrical stimulation, Iontophoresis with 4 mg/ml dexamethasome, Moist heat, traction, Ultrasound, gait training, Therapeutic exercise, balance training, neuromuscular re-education, patient/family education, prosthetic training, manual techniques, passive ROM, dry needling, taping, vasopnuematic device, vestibular, spinal manipulations, joint manipulations  PLAN FOR NEXT SESSION: review/update HEP, progress per protocol.   Estela Vinal C. Kashawn Dirr PT, DPT 01/21/24 5:24 PM    [facility] [address] [place], [place], [address] Phone: [phone]   Fax:  [phone]

## 2024-01-24 ENCOUNTER — Ambulatory Visit (HOSPITAL_BASED_OUTPATIENT_CLINIC_OR_DEPARTMENT_OTHER): Admitting: Physical Therapy

## 2024-01-24 ENCOUNTER — Encounter (HOSPITAL_BASED_OUTPATIENT_CLINIC_OR_DEPARTMENT_OTHER): Payer: Self-pay | Admitting: Physical Therapy

## 2024-01-24 DIAGNOSIS — R252 Cramp and spasm: Secondary | ICD-10-CM

## 2024-01-24 DIAGNOSIS — M6281 Muscle weakness (generalized): Secondary | ICD-10-CM

## 2024-01-24 DIAGNOSIS — R293 Abnormal posture: Secondary | ICD-10-CM

## 2024-01-24 DIAGNOSIS — M25512 Pain in left shoulder: Secondary | ICD-10-CM | POA: Diagnosis not present

## 2024-01-24 NOTE — Patient Instructions (Signed)

## 2024-01-24 NOTE — Therapy (Signed)
 OUTPATIENT PHYSICAL THERAPY SHOULDER TREATMENT   Patient Name: Brianna Gates MRN: [id number] DOB:[birth year], 73 y.o., female Today's Date: 01/24/2024  END OF SESSION:  PT End of Session - 01/24/24 1305     Visit Number 8    Date for PT Re-Evaluation 03/14/24    Authorization Type [employer] Medicare    Progress Note Due on Visit 10    PT Start Time 1304    PT Stop Time 1345    PT Time Calculation (min) 41 min    Activity Tolerance Patient tolerated treatment well    Behavior During Therapy [facility] for tasks assessed/performed                Past Medical History:  Diagnosis Date   Allergy    Anxiety    Asthma    INFREQUENT PROBLEM - rarely used inhaler   CAD (coronary artery disease) 06/29/2023   CCTA 06/27/23: CAC score 15.7 (45th percentile), mild nonobstructive CAD [mid LAD 1-24, proximal LCx 1-24]    Cervical spondylosis    Cervical strain 07/22/2014   Chest pain of uncertain etiology 12/13/2021   Degenerative arthritis    Dyslipidemia    Eczema    GERD (gastroesophageal reflux disease)    Guillain-Barre syndrome (HCC)    History of Guillain-Barre syndrome 07/22/2014   Hyperlipidemia    Hypertension    Hypothyroidism    Lumbosacral spondylosis    Palpitations 12/12/2021   Pure hypercholesterolemia 12/13/2021   Raynaud disease    Thyroid nodule    Past Surgical History:  Procedure Laterality Date   ABDOMINAL HYSTERECTOMY     COLONOSCOPY     ESOPHAGOGASTRODUODENOSCOPY (EGD) WITH PROPOFOL N/A 09/13/2021   Procedure: ESOPHAGOGASTRODUODENOSCOPY (EGD) WITH PROPOFOL;  Surgeon: Hilarie Fredrickson, MD;  Location: [facility];  Service: Endoscopy;  Laterality: N/A;   FOOT SURGERY Left    GANGLION CYST EXCISION Left    Wrist   HERNIA REPAIR     KNEE ARTHROSCOPY     RT KNEE   KNEE ARTHROTOMY Right 08/27/2013   Procedure: RIGHT KNEE SCAR EXCISION AND FEMORAL REVISION;  Surgeon: Loanne Drilling, MD;  Location: [facility];  Service: Orthopedics;  Laterality: Right;    REPLACEMENT TOTAL KNEE Right    2014, 2021   REPLACEMENT TOTAL KNEE BILATERAL Bilateral    SHOULDER ARTHROSCOPY WITH DISTAL CLAVICLE RESECTION Left 12/18/2023   Procedure: LEFT SHOULDER ARTHROSCOPY WITH EXTENSIVE DEBRIDEMENT, DISTAL CLAVICLE RESECTION / ACROMIOPLASTY;  Surgeon: Huel Cote, MD;  Location: [facility];  Service: Orthopedics;  Laterality: Left;   STRABISMUS SURGERY Right    TONSILLECTOMY     vaginal vulvo prolapse  09/16/2015   Patient Active Problem List   Diagnosis Date Noted   Tendinitis of left rotator cuff 12/18/2023   Arthritis of left acromioclavicular joint 12/18/2023   CAD (coronary artery disease) 06/29/2023   Paresthesia 10/18/2022   Onychomycosis 01/29/2022   Chest pain of uncertain etiology 12/13/2021   Pure hypercholesterolemia 12/13/2021   Palpitations 12/12/2021   Esophageal dysphagia    Allergic rhinitis 04/04/2021   Allergic rhinitis due to animal (cat) (dog) hair and dander 04/04/2021   Allergic rhinitis due to pollen 04/04/2021   Hormone deficiency 04/04/2021   Syncope and collapse 12/27/2020   Hypotension 12/27/2020   Bradycardia 12/27/2020   History of COVID-19 12/27/2020   Preoperative evaluation to rule out surgical contraindication 07/16/2020   Arthrofibrosis of knee joint, right 11/26/2019   Arthritis of knee 07/30/2019   Presence of right artificial knee  joint 07/30/2019   Sleep apnea 10/16/2018   Shoulder impingement syndrome 01/15/2018   Gastroesophageal reflux disease 02/02/2016   Sensorineural hearing loss (SNHL), bilateral 02/02/2016   Vaginal vault prolapse after hysterectomy 07/28/2015   History of Guillain-Barre syndrome 07/22/2014   Cervical strain 07/22/2014   Postoperative stiffness of total knee replacement (HCC) 08/27/2013   Abnormality of gait 08/22/2012   Cervical spondylosis without myelopathy 08/22/2012   Muscle weakness (generalized) 08/22/2012   Guillain-Barre syndrome (HCC) 08/22/2012   Essential  hypertension 08/22/2012   Strabismus 08/22/2012   Degenerative joint disease of knee, left 08/22/2012   Hypothyroidism 08/22/2012   Raynaud's disease 08/22/2012   Knee joint replacement by other means 08/22/2012    PCP: Chilton Greathouse, MD  REFERRING PROVIDER: Huel Cote, MD  REFERRING DIAG: Tendinitis of left rotator cuff [M75.82]  Arthroscopic extensive debridement - 29823 Supraspinatus Tendon, Anterior Labrum, Superior Labrum, and Posterior Labrum Arthroscopic distal clavicle excision - 44010 Arthroscopic subacromial decompression - 27253  THERAPY DIAG:  Abnormal posture  Cramp and spasm  Muscle weakness (generalized)  Rationale for Evaluation and Treatment: Rehabilitation  ONSET DATE: DOS 12/18/2023  SUBJECTIVE:                                                                                                                                                                                      SUBJECTIVE STATEMENT: Laying down, I still have pain with overhead motion. I think I may have overworked it in the last 24 hrs. Incision sites are itchy.    Initial evaluation:  Pt presents to PT post op L shoulder arthroscopy with extensive debridement, distal clavicle resection/ acromioplasty. She reports some mild pain in her L shoulder since surgery but thinks it is due to the incisions rubbing against the dressing. She denies any issues with sleeping. She continues to have numbness in her fingers from her recent nerve block. Prior to sx, she was unable to reach [facility], out to the side, and had difficulty dressing her bed.   Hand dominance: Right  PERTINENT HISTORY: HTN, GERD, Gullian- Barre syndrome.   PAIN:  Are you having pain? no: NPRS scale: 0 Pain location:  Pain description: Aggravating factors:  Relieving factors:    PRECAUTIONS: Shoulder  RED FLAGS: None   WEIGHT BEARING RESTRICTIONS: No  FALLS:  Has patient fallen in last 6 months? No  LIVING  ENVIRONMENT: Lives with: lives with their spouse Lives in: House/apartment Stairs: Yes: Internal: 1 flight steps; on right going up and External: 4-5 steps; bilateral but cannot reach both Has following equipment at home: shower chair and Grab bars  OCCUPATION: Retired   PLOF: Independent  PATIENT GOALS: Pt  would like to be able to get back to functional mobility and get back to working out.   NEXT MD VISIT: 12/26/2023  OBJECTIVE:  Note: Objective measures were completed at Evaluation unless otherwise noted.  DIAGNOSTIC FINDINGS:  1. Mild supraspinatus tendinosis with a tiny insertional interstitial tear. 2. Moderate infraspinatus tendinosis.    PATIENT SURVEYS:  Neldon Mc Next session.   COGNITION: Overall cognitive status: Within functional limits for tasks assessed     SENSATION: WFL  POSTURE: Pt is present in sling.   UPPER EXTREMITY ROM:   Passive ROM Right eval Left eval Left  01/09/24 Lt AROM 4/10  Shoulder flexion   AAROM supine with cane, 155 136  Shoulder extension      Shoulder abduction      Shoulder adduction      Shoulder internal rotation      Shoulder external rotation      (Blank rows = not tested)  UPPER EXTREMITY MMT:  MMT Right eval Left eval  Shoulder flexion    Shoulder extension    Shoulder abduction    Shoulder adduction    Shoulder internal rotation    Shoulder external rotation    Middle trapezius    Lower trapezius    Elbow flexion    Elbow extension    Wrist flexion    Wrist extension    Wrist ulnar deviation    Wrist radial deviation    Wrist pronation    Wrist supination    Grip strength (lbs)    (Blank rows = not tested)  JOINT MOBILITY TESTING:  NT today  PALPATION:  No tenderness to surrounding areas.                                                                                                                              TREATMENT  Treatment                            4/10: Blank lines following  charge title = not provided on this treatment date.   Manual:  TPDN YES Trigger Point Dry Needling  Initial Treatment: Pt instructed on Dry Needling rational, procedures, and possible side effects. Pt instructed to expect mild to moderate muscle soreness later in the day and/or into the next day.  Pt instructed in methods to reduce muscle soreness. Pt instructed to continue prescribed HEP. Because Dry Needling was performed over or adjacent to a lung field, pt was educated on S/S of pneumothorax and to seek immediate medical attention should they occur.  Patient verbalized understanding of these instructions and education.   Patient Verbal Consent Given: Yes Education Handout Provided: Yes- in pt instructions Muscles Treated: Lt upper trap Electrical Stimulation Performed: No Treatment Response/Outcome: twitch with decr spasm Prone first rib depression & gross rib PA There-ex: Review of HEP Pulleys flexion & abd Dead lift with scap retraction Wall slide + liftoff  Prone scap retraction+UE ext; prone Y There-Act:  Self Care:  Nuro-Re-ed:  Gait Training:    Treatment                            4/7: Blank lines following charge title = not provided on this treatment date.   Manual:  TPDN No STM subscap, lats Cervical musculature on the left- suboccipital release There-ex: Supine flexion with wand- PT blocking scapula Sidelying ER Sidelying abduction Sidelying horizontal abduction There-Act:  Self Care:  Nuro-Re-ed:  Gait Training:    Treatment                            01/17/24: Blank lines following charge title = not provided on this treatment date.   Manual:  TPDN No PROM L shoulder STM to lats There-ex: Supine shoulder flexion (cues for full range) Sidelying abduction (cues for full range) Sidelying ER 2x10 Standing bicep curls 3# bilateral x15 Theraband tricep extension RTB 2x10 Theraband row RTB 2x10 Review of HEP and proper performance       PATIENT EDUCATION: Education details: HEP,  and POC. Person educated: Patient Education method: Medical illustrator, handout Education comprehension: verbalized understanding and returned demonstration  HOME EXERCISE PROGRAM: Access Code: [email or web address] URL: [email or web address]   ASSESSMENT:   CLINICAL IMPRESSION:   Able to flex past 136 when using wall or pulleys without impingement. Continued improvement in coordinated motion but requires further strengthening for proper form with repetitive activities.    Initial impression: Patient referred to PT for s/p L shoulder arthroscopy with extensive debridement, distal clavicle resection/ acromioplasty. Changed dressings today. Surgical site appears clean with no redness or major swelling noted. She denies any significant pain. Pt provided initial HEP. Patient will benefit from skilled PT to address below impairments, limitations and improve overall function.  OBJECTIVE IMPAIRMENTS: decreased activity tolerance, decreased shoulder mobility, decreased ROM, decreased strength, impaired flexibility, impaired UE use, postural dysfunction, and pain.  ACTIVITY LIMITATIONS: reaching, lifting, carry,  cleaning, driving, and or occupation  PERSONAL FACTORS:  also affecting patient's functional outcome.  REHAB POTENTIAL: Good  CLINICAL DECISION MAKING: Stable/uncomplicated  EVALUATION COMPLEXITY: Low    GOALS: Short term PT Goals Target date: 02/17/2024 Pt will be I and compliant with HEP. Baseline:  Goal status: New Pt will decrease pain by 25% overall Baseline: Goal status: New  Long term PT goals Target date: 03/14/2024 Pt will improve Lt shoulder AROM to [facility] to improve functional reaching Baseline: Goal status: New Pt will improve  Lt shoulder strength to at least 4+/5 MMT to improve functional strength Baseline: Goal status: New Pt will improve Quick Dash by 14 points to have significant change per  MCID.  Baseline: Goal status: New Pt will reduce pain to overall less than 1-2/10 with usual activity and work activity. Baseline: Goal status: New      5. Pt will return to recreational activities without significant pain or limitations.   Baseline:   Goal Status: New  PLAN: PT FREQUENCY: 1-2 x per week.   PT DURATION: 8-12 weeks.   PLANNED INTERVENTIONS (unless contraindicated): aquatic PT, Canalith repositioning, cryotherapy, Electrical stimulation, Iontophoresis with 4 mg/ml dexamethasome, Moist heat, traction, Ultrasound, gait training, Therapeutic exercise, balance training, neuromuscular re-education, patient/family education, prosthetic training, manual techniques, passive ROM, dry needling, taping, vasopnuematic device, vestibular, spinal manipulations, joint manipulations  PLAN FOR NEXT SESSION: review/update HEP, progress per protocol.  Zuhayr Deeney C. Russell Engelstad PT, DPT 01/24/24 2:29 PM    [facility] [address] [place], [place], [address] Phone: [phone]   Fax:  [phone]

## 2024-01-28 ENCOUNTER — Ambulatory Visit (HOSPITAL_BASED_OUTPATIENT_CLINIC_OR_DEPARTMENT_OTHER): Admitting: Physical Therapy

## 2024-01-28 ENCOUNTER — Encounter (HOSPITAL_BASED_OUTPATIENT_CLINIC_OR_DEPARTMENT_OTHER): Payer: Self-pay | Admitting: Physical Therapy

## 2024-01-28 DIAGNOSIS — M6281 Muscle weakness (generalized): Secondary | ICD-10-CM | POA: Diagnosis not present

## 2024-01-28 DIAGNOSIS — R293 Abnormal posture: Secondary | ICD-10-CM | POA: Diagnosis not present

## 2024-01-28 DIAGNOSIS — R252 Cramp and spasm: Secondary | ICD-10-CM

## 2024-01-28 DIAGNOSIS — M25512 Pain in left shoulder: Secondary | ICD-10-CM | POA: Diagnosis not present

## 2024-01-28 NOTE — Therapy (Signed)
 OUTPATIENT PHYSICAL THERAPY SHOULDER TREATMENT   Patient Name: Brianna Gates MRN: [id number] DOB:[birth year], 73 y.o., female Today's Date: 01/28/2024  END OF SESSION:  PT End of Session - 01/28/24 1409     Visit Number 9    Date for PT Re-Evaluation 03/14/24    Authorization Type [employer]    Progress Note Due on Visit 10    PT Start Time 1405    PT Stop Time 1439    PT Time Calculation (min) 34 min    Activity Tolerance Patient tolerated treatment well    Behavior During Therapy [facility] for tasks assessed/performed                Past Medical History:  Diagnosis Date   Allergy    Anxiety    Asthma    INFREQUENT PROBLEM - rarely used inhaler   CAD (coronary artery disease) 06/29/2023   CCTA 06/27/23: CAC score 15.7 (45th percentile), mild nonobstructive CAD [mid LAD 1-24, proximal LCx 1-24]    Cervical spondylosis    Cervical strain 07/22/2014   Chest pain of uncertain etiology 12/13/2021   Degenerative arthritis    Dyslipidemia    Eczema    GERD (gastroesophageal reflux disease)    Guillain-Barre syndrome (HCC)    History of Guillain-Barre syndrome 07/22/2014   Hyperlipidemia    Hypertension    Hypothyroidism    Lumbosacral spondylosis    Palpitations 12/12/2021   Pure hypercholesterolemia 12/13/2021   Raynaud disease    Thyroid nodule    Past Surgical History:  Procedure Laterality Date   ABDOMINAL HYSTERECTOMY     COLONOSCOPY     ESOPHAGOGASTRODUODENOSCOPY (EGD) WITH PROPOFOL N/A 09/13/2021   Procedure: ESOPHAGOGASTRODUODENOSCOPY (EGD) WITH PROPOFOL;  Surgeon: Tobin Forts, MD;  Location: [facility];  Service: Endoscopy;  Laterality: N/A;   FOOT SURGERY Left    GANGLION CYST EXCISION Left    Wrist   HERNIA REPAIR     KNEE ARTHROSCOPY     RT KNEE   KNEE ARTHROTOMY Right 08/27/2013   Procedure: RIGHT KNEE SCAR EXCISION AND FEMORAL REVISION;  Surgeon: Aurther Blue, MD;  Location: [facility];  Service: Orthopedics;  Laterality: Right;    REPLACEMENT TOTAL KNEE Right    2014, 2021   REPLACEMENT TOTAL KNEE BILATERAL Bilateral    SHOULDER ARTHROSCOPY WITH DISTAL CLAVICLE RESECTION Left 12/18/2023   Procedure: LEFT SHOULDER ARTHROSCOPY WITH EXTENSIVE DEBRIDEMENT, DISTAL CLAVICLE RESECTION / ACROMIOPLASTY;  Surgeon: Wilhelmenia Harada, MD;  Location:  SURGERY CENTER;  Service: Orthopedics;  Laterality: Left;   STRABISMUS SURGERY Right    TONSILLECTOMY     vaginal vulvo prolapse  09/16/2015   Patient Active Problem List   Diagnosis Date Noted   Tendinitis of left rotator cuff 12/18/2023   Arthritis of left acromioclavicular joint 12/18/2023   CAD (coronary artery disease) 06/29/2023   Paresthesia 10/18/2022   Onychomycosis 01/29/2022   Chest pain of uncertain etiology 12/13/2021   Pure hypercholesterolemia 12/13/2021   Palpitations 12/12/2021   Esophageal dysphagia    Allergic rhinitis 04/04/2021   Allergic rhinitis due to animal (cat) (dog) hair and dander 04/04/2021   Allergic rhinitis due to pollen 04/04/2021   Hormone deficiency 04/04/2021   Syncope and collapse 12/27/2020   Hypotension 12/27/2020   Bradycardia 12/27/2020   History of COVID-19 12/27/2020   Preoperative evaluation to rule out surgical contraindication 07/16/2020   Arthrofibrosis of knee joint, right 11/26/2019   Arthritis of knee 07/30/2019   Presence of right artificial knee  joint 07/30/2019   Sleep apnea 10/16/2018   Shoulder impingement syndrome 01/15/2018   Gastroesophageal reflux disease 02/02/2016   Sensorineural hearing loss (SNHL), bilateral 02/02/2016   Vaginal vault prolapse after hysterectomy 07/28/2015   History of Guillain-Barre syndrome 07/22/2014   Cervical strain 07/22/2014   Postoperative stiffness of total knee replacement (HCC) 08/27/2013   Abnormality of gait 08/22/2012   Cervical spondylosis without myelopathy 08/22/2012   Muscle weakness (generalized) 08/22/2012   Guillain-Barre syndrome (HCC) 08/22/2012   Essential  hypertension 08/22/2012   Strabismus 08/22/2012   Degenerative joint disease of knee, left 08/22/2012   Hypothyroidism 08/22/2012   Raynaud's disease 08/22/2012   Knee joint replacement by other means 08/22/2012    PCP: Lonzie Robins, MD  REFERRING PROVIDER: Wilhelmenia Harada, MD  REFERRING DIAG: Tendinitis of left rotator cuff [M75.82]  Arthroscopic extensive debridement - 29823 Supraspinatus Tendon, Anterior Labrum, Superior Labrum, and Posterior Labrum Arthroscopic distal clavicle excision - 45409 Arthroscopic subacromial decompression - 81191  THERAPY DIAG:  Abnormal posture  Cramp and spasm  Muscle weakness (generalized)  Rationale for Evaluation and Treatment: Rehabilitation  ONSET DATE: DOS 12/18/2023  SUBJECTIVE:                                                                                                                                                                                      SUBJECTIVE STATEMENT: Its a little more achey and feels heavier than normal. 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   Initial evaluation:  Pt presents to PT post op L shoulder arthroscopy with extensive debridement, distal clavicle resection/ acromioplasty. She reports some mild pain in her L shoulder since surgery but thinks it is due to the incisions rubbing against the dressing. She denies any issues with sleeping. She continues to have numbness in her fingers from her recent nerve block. Prior to sx, she was unable to reach [facility], out to the side, and had difficulty dressing her bed.   Hand dominance: Right  PERTINENT HISTORY: HTN, GERD, Gullian- Barre syndrome.   PAIN:  Are you having pain? no: NPRS scale: 0 Pain location:  Pain description: Aggravating factors:  Relieving factors:    PRECAUTIONS: Shoulder  RED FLAGS: None   WEIGHT BEARING RESTRICTIONS: No  FALLS:  Has patient fallen in last 6 months? No  LIVING ENVIRONMENT: Lives with: lives with  their spouse Lives in: House/apartment Stairs: Yes: Internal: 1 flight steps; on right going up and External: 4-5 steps; bilateral but cannot reach both Has following equipment at home: shower chair and Grab bars  OCCUPATION: Retired   PLOF: Independent  PATIENT GOALS: Pt would like to be able to get  back to functional mobility and get back to working out.   NEXT MD VISIT: 12/26/2023  OBJECTIVE:  Note: Objective measures were completed at Evaluation unless otherwise noted.  DIAGNOSTIC FINDINGS:  1. Mild supraspinatus tendinosis with a tiny insertional interstitial tear. 2. Moderate infraspinatus tendinosis.    PATIENT SURVEYS:  Cindia Crease Next session.   COGNITION: Overall cognitive status: Within functional limits for tasks assessed     SENSATION: WFL  POSTURE: Pt is present in sling.   UPPER EXTREMITY ROM:   Passive ROM Right eval Left eval Left  01/09/24 Lt AROM 4/10  Shoulder flexion   AAROM supine with cane, 155 136  Shoulder extension      Shoulder abduction      Shoulder adduction      Shoulder internal rotation      Shoulder external rotation      (Blank rows = not tested)  UPPER EXTREMITY MMT:  MMT Right eval Left eval  Shoulder flexion    Shoulder extension    Shoulder abduction    Shoulder adduction    Shoulder internal rotation    Shoulder external rotation    Middle trapezius    Lower trapezius    Elbow flexion    Elbow extension    Wrist flexion    Wrist extension    Wrist ulnar deviation    Wrist radial deviation    Wrist pronation    Wrist supination    Grip strength (lbs)    (Blank rows = not tested)  JOINT MOBILITY TESTING:  NT today  PALPATION:  No tenderness to surrounding areas.                                                                                                                              TREATMENT  Treatment                            4/14: Blank lines following charge title = not provided on this  treatment date.   Manual:  TPDN No STM upper traps Cervical distraction STM subscap There-ex: Seated round back, + OH reach, + forward serve, + ER Supine overhead flexion- able to achieve 140 deg There-Act:  Self Care:  Nuro-Re-ed:  Gait Training:   Treatment                            4/10: Blank lines following charge title = not provided on this treatment date.   Manual:  TPDN YES Trigger Point Dry Needling  Initial Treatment: Pt instructed on Dry Needling rational, procedures, and possible side effects. Pt instructed to expect mild to moderate muscle soreness later in the day and/or into the next day.  Pt instructed in methods to reduce muscle soreness. Pt instructed to continue prescribed HEP. Because Dry Needling was performed over or adjacent to a lung field, pt  was educated on S/S of pneumothorax and to seek immediate medical attention should they occur.  Patient verbalized understanding of these instructions and education.   Patient Verbal Consent Given: Yes Education Handout Provided: Yes- in pt instructions Muscles Treated: Lt upper trap Electrical Stimulation Performed: No Treatment Response/Outcome: twitch with decr spasm Prone first rib depression & gross rib PA There-ex: Review of HEP Pulleys flexion & abd Dead lift with scap retraction Wall slide + liftoff Prone scap retraction+UE ext; prone Y There-Act:  Self Care:  Nuro-Re-ed:  Gait Training:    Treatment                            4/7: Blank lines following charge title = not provided on this treatment date.   Manual:  TPDN No STM subscap, lats Cervical musculature on the left- suboccipital release There-ex: Supine flexion with wand- PT blocking scapula Sidelying ER Sidelying abduction Sidelying horizontal abduction There-Act:  Self Care:  Nuro-Re-ed:  Gait Training:      PATIENT EDUCATION: Education details: HEP,  and POC. Person educated: Patient Education method:  Medical illustrator, handout Education comprehension: verbalized understanding and returned demonstration  HOME EXERCISE PROGRAM: Access Code: [email or web address] URL: [email or web address]   ASSESSMENT:   CLINICAL IMPRESSION:   Improved OH reach with release of scapular musculature and upper traps. Will benefit from continued post chain strengthening to support increased thoracic kyphosis.    Initial impression: Patient referred to PT for s/p L shoulder arthroscopy with extensive debridement, distal clavicle resection/ acromioplasty. Changed dressings today. Surgical site appears clean with no redness or major swelling noted. She denies any significant pain. Pt provided initial HEP. Patient will benefit from skilled PT to address below impairments, limitations and improve overall function.  OBJECTIVE IMPAIRMENTS: decreased activity tolerance, decreased shoulder mobility, decreased ROM, decreased strength, impaired flexibility, impaired UE use, postural dysfunction, and pain.  ACTIVITY LIMITATIONS: reaching, lifting, carry,  cleaning, driving, and or occupation  PERSONAL FACTORS:  also affecting patient's functional outcome.  REHAB POTENTIAL: Good  CLINICAL DECISION MAKING: Stable/uncomplicated  EVALUATION COMPLEXITY: Low    GOALS: Short term PT Goals Target date: 02/17/2024 Pt will be I and compliant with HEP. Baseline:  Goal status: New Pt will decrease pain by 25% overall Baseline: Goal status: New  Long term PT goals Target date: 03/14/2024 Pt will improve Lt shoulder AROM to [facility] to improve functional reaching Baseline: Goal status: New Pt will improve  Lt shoulder strength to at least 4+/5 MMT to improve functional strength Baseline: Goal status: New Pt will improve Quick Dash by 14 points to have significant change per MCID.  Baseline: Goal status: New Pt will reduce pain to overall less than 1-2/10 with usual activity and work  activity. Baseline: Goal status: New      5. Pt will return to recreational activities without significant pain or limitations.   Baseline:   Goal Status: New  PLAN: PT FREQUENCY: 1-2 x per week.   PT DURATION: 8-12 weeks.   PLANNED INTERVENTIONS (unless contraindicated): aquatic PT, Canalith repositioning, cryotherapy, Electrical stimulation, Iontophoresis with 4 mg/ml dexamethasome, Moist heat, traction, Ultrasound, gait training, Therapeutic exercise, balance training, neuromuscular re-education, patient/family education, prosthetic training, manual techniques, passive ROM, dry needling, taping, vasopnuematic device, vestibular, spinal manipulations, joint manipulations  PLAN FOR NEXT SESSION: review/update HEP, progress per protocol.   Malcome Ambrocio C. Anaija Wissink PT, DPT 01/28/24 2:41 PM    [facility] [phone]  [address]  [place], [place], [address] Phone: [phone]   Fax:  [phone]

## 2024-01-29 DIAGNOSIS — J3089 Other allergic rhinitis: Secondary | ICD-10-CM | POA: Diagnosis not present

## 2024-01-29 DIAGNOSIS — J301 Allergic rhinitis due to pollen: Secondary | ICD-10-CM | POA: Diagnosis not present

## 2024-01-29 DIAGNOSIS — J3081 Allergic rhinitis due to animal (cat) (dog) hair and dander: Secondary | ICD-10-CM | POA: Diagnosis not present

## 2024-01-30 ENCOUNTER — Ambulatory Visit: Payer: Medicare HMO | Admitting: Neurology

## 2024-01-30 ENCOUNTER — Encounter: Payer: Self-pay | Admitting: Neurology

## 2024-01-30 VITALS — BP 110/70 | Ht 65.0 in

## 2024-01-30 DIAGNOSIS — F419 Anxiety disorder, unspecified: Secondary | ICD-10-CM | POA: Insufficient documentation

## 2024-01-30 DIAGNOSIS — G610 Guillain-Barre syndrome: Secondary | ICD-10-CM | POA: Diagnosis not present

## 2024-01-30 DIAGNOSIS — G6289 Other specified polyneuropathies: Secondary | ICD-10-CM

## 2024-01-30 DIAGNOSIS — R202 Paresthesia of skin: Secondary | ICD-10-CM | POA: Diagnosis not present

## 2024-01-30 DIAGNOSIS — G629 Polyneuropathy, unspecified: Secondary | ICD-10-CM | POA: Insufficient documentation

## 2024-01-30 MED ORDER — DULOXETINE HCL 30 MG PO CPEP: 30.0000 mg | ORAL_CAPSULE | Freq: Every day | ORAL | 11 refills | Status: AC

## 2024-01-30 MED ORDER — GABAPENTIN 400 MG PO CAPS: 400.0000 mg | ORAL_CAPSULE | Freq: Three times a day (TID) | ORAL | 11 refills | Status: DC

## 2024-01-30 NOTE — Procedures (Signed)
 Full Name: Brianna Gates Gender: Female MRN #: [id number] Date of Birth: June 05, 1951    Visit Date: 01/30/2024 12:14 Age: 73 Years Examining Physician: Levert Feinstein Referring Physician: Margie Ege Height: 5 feet 4 inch History: 73 year old female presenting with worsening lower extremity paresthesia, history of Guillain-Barr syndrome that was treated,  Summary of the test:  Nerve conduction study:  Right sural and superficial peroneal sensory responses showed mildly prolonged peak latency with mild to moderately decreased snap amplitude.  Right median and ulnar sensory responses also showed mild to moderately prolonged peak latency within normal range snap amplitude.  Right median/ulnar transcarpal comparison showed right median peak latency was 0.4 ms prolonged in comparison to ipsilateral ulnar mixed response  Right tibial motor responses were normal.  Right peroneal to EDB motor responses were absent.  Right ulnar motor responses were normal,  Right median motor responses showed mildly prolonged distal latency with well-preserved CMAP amplitude  Electromyography:  Selected needle examinations were performed at right lower, upper extremity muscles lumbar and cervical paraspinal muscles.  There was no significant abnormality noted.   Conclusion: This is a mild abnormal study.  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  In addition, there is evidence of moderate right median neuropathy across the wrist consistent with moderate right carpal tunnel syndromes.   Levert Feinstein. M.D. Ph.D.   [facility] [address], [address] [place], [place] [address] Tel: [phone] Fax: [phone]  Verbal informed consent was obtained from the patient, patient was informed of potential risk of procedure, including bruising, bleeding, hematoma formation, infection, muscle weakness,  muscle pain, numbness, among others.        MNC    Nerve / Sites Muscle Latency Ref. Amplitude Ref. Rel Amp Segments Distance Velocity Ref. Area    ms ms mV mV %  cm m/s m/s mVms  R Median - APB     Wrist APB 4.8 <=4.4 8.5 >=4.0 100 Wrist - APB 7   30.8     Upper arm APB 9.1  8.0  94.3 Upper arm - Wrist 22 51 >=49 30.3  R Ulnar - ADM     Wrist ADM 3.2 <=3.3 9.8 >=6.0 100 Wrist - ADM 7   40.4     B.Elbow ADM 6.0  9.6  98 B.Elbow - Wrist 14 51 >=49 38.2     A.Elbow ADM 8.5  9.4  98 A.Elbow - B.Elbow 13 52 >=49 38.6  R Peroneal - EDB     Ankle EDB NR <=6.5 NR >=2.0 NR Ankle - EDB 9   NR         Pop fossa - Ankle      R Tibial - AH     Ankle AH 5.1 <=5.8 8.1 >=4.0 100 Ankle - AH 9   18.7     Pop fossa AH 15.3  5.5  67.5 Pop fossa - Ankle 42 41 >=41 16.1             SNC    Nerve / Sites Rec. Site Peak Lat Ref.  Amp Ref. Segments Distance Peak Diff Ref.    ms ms V V  cm ms ms  R Sural - Ankle (Calf)     Calf Ankle 4.6 <=4.4 4 >=6 Calf - Ankle 14    R Superficial peroneal - Ankle     Lat leg Ankle 4.2 <=4.4 3 >=6 Lat leg - Ankle 14  R Median, Ulnar - Transcarpal comparison     Median Palm Wrist 3.2 <=2.2 22 >=35 Median Palm - Wrist 8       Ulnar Palm Wrist 2.7 <=2.2 20 >=12 Ulnar Palm - Wrist 8          Median Palm - Ulnar Palm  0.4 <=0.4  R Median - Orthodromic (Dig II, Mid palm)     Dig II Wrist 4.6 <=3.4 17 >=10 Dig II - Wrist 13    R Ulnar - Orthodromic, (Dig V, Mid palm)     Dig V Wrist 3.6 <=3.1 5 >=5 Dig V - Wrist 90                 F  Wave    Nerve F Lat Ref.   ms ms  R Median - APB 29.6 <=31.0  R Ulnar - ADM 28.6 <=32.0  R Tibial - AH 59.8 <=56.0           EMG Summary Table    Spontaneous MUAP Recruitment  Muscle IA Fib PSW Fasc Other Amp Dur. Poly Pattern  R. First dorsal interosseous Normal None None None _______ Normal Normal Normal Normal  R. Pronator teres Normal None None None _______ Normal Normal Normal Normal  R. Biceps brachii Normal None None None  _______ Normal Normal Normal Normal  R. Deltoid Normal None None None _______ Normal Normal Normal Normal  R. Triceps brachii Normal None None None _______ Normal Normal Normal Normal  R. Brachioradialis Normal None None None _______ Normal Normal Normal Normal  R. Cervical paraspinals Normal None None None _______ Normal Normal Normal Normal  R. Tibialis anterior Normal None None None _______ Normal Normal Normal Normal  R. Tibialis posterior Normal None None None _______ Normal Normal Normal Normal  R. Peroneus longus Normal None None None _______ Normal Normal Normal Normal  R. Gastrocnemius (Medial head) Normal None None None _______ Normal Normal Normal Normal  R. Vastus lateralis Normal None None None _______ Normal Normal Normal Normal  R. Lumbar paraspinals (low) Normal None None None _______ Normal Normal Normal Normal  R. Lumbar paraspinals (mid) Normal None None None _______ Normal Normal Normal Normal

## 2024-01-30 NOTE — Progress Notes (Signed)
 Chief Complaint  Patient presents with   EMG    Rm 3      ASSESSMENT AND PLAN  Brianna Gates is a 73 y.o. 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 numbness tingling, worsening paresthesia, Anxiety  She has limited range of motion of bilateral ankle, which likely from her prolonged bedridden when she was dealing with her Guillain-Barr syndrome many years ago, this can explain her complaints of mildly unsteady gait,  EMG nerve conduction study only showed mild peripheral neuropathy, with no apparent demyelinating features, moderate right carpal tunnel syndromes,  With her complaints of increased lower extremity paresthesia, laboratory evaluation to rule out treatable etiology  Keep gabapentin 400 mg 3 times a day, add on Cymbalta 30 mg every morning  Return To Clinic With NP In 6 Months if she tolerate Cymbalta, titrating to higher dose 60 mg daily, encourage patient to use gabapentin at nighttime  DIAGNOSTIC DATA (LABS, IMAGING, TESTING) - I reviewed patient records, labs, notes, testing and imaging myself where available.   MEDICAL HISTORY:  Brianna Gates, is referred for electrodiagnostic study for her complaints of worsening bilateral lower extremity paresthesia,  her primary care physician is Dr. Felipa Eth, Joylene Gates,     History is obtained from the patient and review of electronic medical records. I personally reviewed pertinent available imaging films in PACS.   PMHx of  HLD HTN Hypothyrodism CAD Hx of left foot surgery Bilateral knee replacement Anxiety  Patient was diagnosed with Guillain-Barr syndrome many years ago, presenting with subacute onset ascending paresthesia, gait abnormality, to the point she was bedridden, require repeat IVIG, prolonged rehabilitation, more than 6 months later, she was able to ambulate again  But she had residual mild unsteady gait, lower extremity paresthesia, I was able to find  previous record by Dr. Anne Hahn since April 2014, initial examination document steady gait, good motor strength with mildly decreased deep tendon reflex,  Over the years she has been followed by our clinic for her complaints of mild unsteady gait, lower extremity paresthesia, titrating dose of gabapentin, currently taking 400 mg 3 times a day  She remain active, exercise regularly, better over the past couple years, she noticed increased lower extremity paresthesia described as numbness, stinging sensation, more of a nuisance, denies significant pain, to have the urge to stretch her leg sometimes pacing around when she sits still for extended period of time  In addition she suffered anxiety, on BuSpar 20 mg twice a day with suboptimal control  She also had left shoulder surgery, shoulder pain, multiple bilateral knee surgery, left foot surgery  EMG nerve conduction study January 30, 2024 showed mild sensorimotor polyneuropathy, 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  In addition there is evidence of moderate right carpal tunnel syndromes   PHYSICAL EXAM:   Vitals:   01/30/24 1116  BP: 110/70  Height: 5\' 5"  (1.651 m)    Body mass index is 19.59 kg/m.  PHYSICAL EXAMNIATION:  Gen: NAD, conversant, well nourised, well groomed                     Cardiovascular: Regular rate rhythm, no peripheral edema, warm, nontender. Eyes: Conjunctivae clear without exudates or hemorrhage Neck: Supple, no carotid bruits. Pulmonary: Clear to auscultation bilaterally   NEUROLOGICAL EXAM:  MENTAL STATUS: Speech/cognition: Awake, alert, oriented to history taking and casual conversation CRANIAL NERVES: CN II: Visual fields are full to confrontation. Pupils are round equal and briskly  reactive to light. CN III, IV, VI: extraocular movement are normal. No ptosis. CN V: Facial sensation is intact to light touch CN VII: Face is symmetric with normal eye closure  CN VIII: Hearing is normal to  causal conversation. CN IX, X: Phonation is normal. CN XI: Head turning and shoulder shrug are intact  MOTOR: Limited range of motion of bilateral ankle, worse on the right side, extension does not past 90 degree  REFLEXES: Reflexes are 2 and symmetric at the biceps, triceps, absent at bilateral knees, and ankles. Plantar responses are flexor.  SENSORY: Preserved to toe proprioception, vibratory sensation, mildly length-dependent decreased to pinprick,  COORDINATION: There is no trunk or limb dysmetria noted.  GAIT/STANCE: Push-up to get up from seated position, cautious, difficulty perform heel walking  REVIEW OF SYSTEMS:  Full 14 system review of systems performed and notable only for as above All other review of systems were negative.   ALLERGIES: Allergies  Allergen Reactions   Captopril Other (See Comments)    reaction   Oxaprozin Hives and Other (See Comments)   Ramipril Cough and Other (See Comments)   Zoloft [Sertraline Hcl] Diarrhea   Ace Inhibitors     Other reaction(s): Other (See Comments) GB syndrome   Doxycycline Other (See Comments)    Burning sensation/Rash   Influenza Vac Split Quad     Other Reaction(s): Guillain-Barr syndrome   Influenza Vaccines     HX    Sulfa Antibiotics Other (See Comments)    unknown    HOME MEDICATIONS: Current Outpatient Medications  Medication Sig Dispense Refill   acetaminophen (TYLENOL) 650 MG CR tablet Take 1,300 mg by mouth every 8 (eight) hours as needed for pain or fever.     albuterol (PROVENTIL HFA;VENTOLIN HFA) 108 (90 BASE) MCG/ACT inhaler Inhale 2 puffs into the lungs every 6 (six) hours as needed for wheezing or shortness of breath.     amLODipine (NORVASC) 2.5 MG tablet Take 1 tablet (2.5 mg total) by mouth every evening. 90 tablet 3   amoxicillin (AMOXIL) 500 MG capsule Take 2,000 mg by mouth once as needed (prior to dental appointments).     Ascorbic Acid (VITAMIN C PO) Take 1 tablet by mouth as needed (sick  symptoms).     aspirin EC 325 MG tablet Take 1 tablet (325 mg total) by mouth daily. 14 tablet 0   aspirin EC 81 MG tablet Take 81 mg by mouth daily. Swallow whole.     azelastine (ASTELIN) 0.1 % nasal spray Place 1 spray into both nostrils 2 (two) times daily as needed (cold symptoms).     busPIRone (BUSPAR) 30 MG tablet Take 30 mg by mouth 2 (two) times daily.     Cholecalciferol (VITAMIN D) 2000 UNITS CAPS Take 2,000 Units by mouth daily in the afternoon.     COVID-19 mRNA vaccine 2023-2024 (COMIRNATY) SUSP injection Inject into the muscle. 0.3 mL 0   COVID-19 mRNA vaccine, Pfizer, (COMIRNATY) syringe Inject into the muscle. 0.3 mL 0   Emollient (CETAPHIL) cream Apply 1 application topically daily.     EPINEPHrine 0.3 mg/0.3 mL IJ SOAJ injection Inject 0.3 mg into the muscle once as needed for anaphylaxis.     estradiol (ESTRACE) 0.1 MG/GM vaginal cream Place 1 Applicatorful vaginally 2 (two) times a week.     fexofenadine-pseudoephedrine (ALLEGRA-D 24) 180-240 MG 24 hr tablet Take 1 tablet by mouth daily.     gabapentin (NEURONTIN) 400 MG capsule Take 1 capsule (400 mg total)  by mouth 3 (three) times daily. 90 capsule 5   ibuprofen (ADVIL,MOTRIN) 100 MG tablet Take 400 mg by mouth daily at 12 noon. May take in the pm additionally as needed     ketoconazole (NIZORAL) 2 % cream Apply 1 application topically daily. (Patient taking differently: Apply 1 application  topically as needed.) 60 g 0   levothyroxine (SYNTHROID) 50 MCG tablet Take 50 mcg by mouth daily before breakfast.      losartan (COZAAR) 50 MG tablet Take 1 tablet (50 mg total) by mouth in the morning. 90 tablet 3   montelukast (SINGULAIR) 10 MG tablet Take 10 mg by mouth at bedtime.     oxyCODONE (ROXICODONE) 5 MG immediate release tablet Take 1 tablet (5 mg total) by mouth every 4 (four) hours as needed for severe pain (pain score 7-10) or breakthrough pain. 30 tablet 0   pantoprazole (PROTONIX) 40 MG tablet Take 1 tablet (40 mg  total) by mouth 2 (two) times daily. Office visit for further refills (Patient taking differently: Take 40 mg by mouth as needed. Office visit for further refills) 180 tablet 0   rosuvastatin (CRESTOR) 10 MG tablet Take 1 tablet (10 mg total) by mouth every other day. 90 tablet 3   Simethicone (PHAZYME PO) Take 250 mg by mouth daily as needed (Gas).     triamcinolone cream (KENALOG) 0.1 % 1 application as needed (itching bumps on stomach). Mixed with Cetaphil     VEVYE 0.1 % SOLN Apply 1 drop to eye 2 (two) times daily.     No current facility-administered medications for this visit.    PAST MEDICAL HISTORY: Past Medical History:  Diagnosis Date   Allergy    Anxiety    Asthma    INFREQUENT PROBLEM - rarely used inhaler   CAD (coronary artery disease) 06/29/2023   CCTA 06/27/23: CAC score 15.7 (45th percentile), mild nonobstructive CAD [mid LAD 1-24, proximal LCx 1-24]    Cervical spondylosis    Cervical strain 07/22/2014   Chest pain of uncertain etiology 12/13/2021   Degenerative arthritis    Dyslipidemia    Eczema    GERD (gastroesophageal reflux disease)    Guillain-Barre syndrome (HCC)    History of Guillain-Barre syndrome 07/22/2014   Hyperlipidemia    Hypertension    Hypothyroidism    Lumbosacral spondylosis    Palpitations 12/12/2021   Pure hypercholesterolemia 12/13/2021   Raynaud disease    Thyroid nodule     PAST SURGICAL HISTORY: Past Surgical History:  Procedure Laterality Date   ABDOMINAL HYSTERECTOMY     COLONOSCOPY     ESOPHAGOGASTRODUODENOSCOPY (EGD) WITH PROPOFOL N/A 09/13/2021   Procedure: ESOPHAGOGASTRODUODENOSCOPY (EGD) WITH PROPOFOL;  Surgeon: Tobin Forts, MD;  Location: [facility];  Service: Endoscopy;  Laterality: N/A;   FOOT SURGERY Left    GANGLION CYST EXCISION Left    Wrist   HERNIA REPAIR     KNEE ARTHROSCOPY     RT KNEE   KNEE ARTHROTOMY Right 08/27/2013   Procedure: RIGHT KNEE SCAR EXCISION AND FEMORAL REVISION;  Surgeon: Aurther Blue, MD;  Location: [facility];  Service: Orthopedics;  Laterality: Right;   REPLACEMENT TOTAL KNEE Right    2014, 2021   REPLACEMENT TOTAL KNEE BILATERAL Bilateral    SHOULDER ARTHROSCOPY WITH DISTAL CLAVICLE RESECTION Left 12/18/2023   Procedure: LEFT SHOULDER ARTHROSCOPY WITH EXTENSIVE DEBRIDEMENT, DISTAL CLAVICLE RESECTION / ACROMIOPLASTY;  Surgeon: Wilhelmenia Harada, MD;  Location: [facility];  Service: Orthopedics;  Laterality:  Left;   STRABISMUS SURGERY Right    TONSILLECTOMY     vaginal vulvo prolapse  09/16/2015    FAMILY HISTORY: Family History  Problem Relation Age of Onset   Hypertension Mother    Other Mother        Dyslipidemia   Stroke Mother    Heart attack Father        93s   Heart disease Father    Hemochromatosis Father    Hemochromatosis Brother    Hyperlipidemia Brother    Hypertension Brother    Other Brother        Dyslipidemia   Stomach cancer Paternal Uncle    Colon cancer Neg Hx    Rectal cancer Neg Hx    Esophageal cancer Neg Hx     SOCIAL HISTORY: Social History   Socioeconomic History   Marital status: Married    Spouse name: Not on file   Number of children: 2   Years of education: college   Highest education level: Not on file  Occupational History    Employer: UNEMPLOYED  Tobacco Use   Smoking status: Never   Smokeless tobacco: Never  Vaping Use   Vaping status: Never Used  Substance and Sexual Activity   Alcohol use: Not Currently    Comment: stopped drinking   Drug use: No   Sexual activity: Yes    Birth control/protection: Other-see comments, Post-menopausal    Comment: Hysterectomy  Other Topics Concern   Not on file  Social History Narrative   Lives at home, married   Patient is right handed.   Patient drinks 2-3 cups caffeine daily.   Social Drivers of Corporate investment banker Strain: Not on file  Food Insecurity: Not on file  Transportation Needs: Not on file  Physical Activity: Not on file   Stress: Not on file  Social Connections: Not on file  Intimate Partner Violence: Not on file      Phebe Brasil, M.D. Ph.D.  [facility] [address], [address] [place], [place] [address] Ph: [phone] Fax: [phone]  CC:  Lonzie Robins, MD [address] [place],  [place] [address]  Lonzie Robins, MD

## 2024-01-31 ENCOUNTER — Encounter (HOSPITAL_BASED_OUTPATIENT_CLINIC_OR_DEPARTMENT_OTHER): Payer: Self-pay

## 2024-01-31 ENCOUNTER — Ambulatory Visit (HOSPITAL_BASED_OUTPATIENT_CLINIC_OR_DEPARTMENT_OTHER)

## 2024-01-31 DIAGNOSIS — R252 Cramp and spasm: Secondary | ICD-10-CM

## 2024-01-31 DIAGNOSIS — M6281 Muscle weakness (generalized): Secondary | ICD-10-CM

## 2024-01-31 DIAGNOSIS — M25512 Pain in left shoulder: Secondary | ICD-10-CM | POA: Diagnosis not present

## 2024-01-31 DIAGNOSIS — R293 Abnormal posture: Secondary | ICD-10-CM | POA: Diagnosis not present

## 2024-01-31 NOTE — Therapy (Signed)
 OUTPATIENT PHYSICAL THERAPY SHOULDER TREATMENT Progress Note Reporting Period 12/22/2023 to 03/14/2024  See note below for Objective Data and Assessment of Progress/Goals.       Patient Name: Brianna Gates MRN: [id number] DOB:11/08/1950, 73 y.o., female Today's Date: 01/31/2024  END OF SESSION:  PT End of Session - 01/31/24 1319     Visit Number 10    Date for PT Re-Evaluation 03/14/24    Authorization Type [employer]    Progress Note Due on Visit 10    PT Start Time 1305    PT Stop Time 1346    PT Time Calculation (min) 41 min    Activity Tolerance Patient tolerated treatment well    Behavior During Therapy [facility] for tasks assessed/performed                 Past Medical History:  Diagnosis Date   Allergy    Anxiety    Asthma    INFREQUENT PROBLEM - rarely used inhaler   CAD (coronary artery disease) 06/29/2023   CCTA 06/27/23: CAC score 15.7 (45th percentile), mild nonobstructive CAD [mid LAD 1-24, proximal LCx 1-24]    Cervical spondylosis    Cervical strain 07/22/2014   Chest pain of uncertain etiology 12/13/2021   Degenerative arthritis    Dyslipidemia    Eczema    GERD (gastroesophageal reflux disease)    Guillain-Barre syndrome (HCC)    History of Guillain-Barre syndrome 07/22/2014   Hyperlipidemia    Hypertension    Hypothyroidism    Lumbosacral spondylosis    Palpitations 12/12/2021   Pure hypercholesterolemia 12/13/2021   Raynaud disease    Thyroid nodule    Past Surgical History:  Procedure Laterality Date   ABDOMINAL HYSTERECTOMY     COLONOSCOPY     ESOPHAGOGASTRODUODENOSCOPY (EGD) WITH PROPOFOL N/A 09/13/2021   Procedure: ESOPHAGOGASTRODUODENOSCOPY (EGD) WITH PROPOFOL;  Surgeon: Hilarie Fredrickson, MD;  Location: [facility];  Service: Endoscopy;  Laterality: N/A;   FOOT SURGERY Left    GANGLION CYST EXCISION Left    Wrist   HERNIA REPAIR     KNEE ARTHROSCOPY     RT KNEE   KNEE ARTHROTOMY Right 08/27/2013   Procedure: RIGHT  KNEE SCAR EXCISION AND FEMORAL REVISION;  Surgeon: Loanne Drilling, MD;  Location: [facility];  Service: Orthopedics;  Laterality: Right;   REPLACEMENT TOTAL KNEE Right    2014, 2021   REPLACEMENT TOTAL KNEE BILATERAL Bilateral    SHOULDER ARTHROSCOPY WITH DISTAL CLAVICLE RESECTION Left 12/18/2023   Procedure: LEFT SHOULDER ARTHROSCOPY WITH EXTENSIVE DEBRIDEMENT, DISTAL CLAVICLE RESECTION / ACROMIOPLASTY;  Surgeon: Huel Cote, MD;  Location: [facility];  Service: Orthopedics;  Laterality: Left;   STRABISMUS SURGERY Right    TONSILLECTOMY     vaginal vulvo prolapse  09/16/2015   Patient Active Problem List   Diagnosis Date Noted   Anxiety 01/30/2024   Peripheral neuropathy 01/30/2024   Tendinitis of left rotator cuff 12/18/2023   Arthritis of left acromioclavicular joint 12/18/2023   CAD (coronary artery disease) 06/29/2023   Paresthesia 10/18/2022   Onychomycosis 01/29/2022   Chest pain of uncertain etiology 12/13/2021   Pure hypercholesterolemia 12/13/2021   Palpitations 12/12/2021   Esophageal dysphagia    Allergic rhinitis 04/04/2021   Allergic rhinitis due to animal (cat) (dog) hair and dander 04/04/2021   Allergic rhinitis due to pollen 04/04/2021   Hormone deficiency 04/04/2021   Syncope and collapse 12/27/2020   Hypotension 12/27/2020   Bradycardia 12/27/2020   History of COVID-19  12/27/2020   Preoperative evaluation to rule out surgical contraindication 07/16/2020   Arthrofibrosis of knee joint, right 11/26/2019   Arthritis of knee 07/30/2019   Presence of right artificial knee joint 07/30/2019   Sleep apnea 10/16/2018   Shoulder impingement syndrome 01/15/2018   Gastroesophageal reflux disease 02/02/2016   Sensorineural hearing loss (SNHL), bilateral 02/02/2016   Vaginal vault prolapse after hysterectomy 07/28/2015   History of Guillain-Barre syndrome 07/22/2014   Cervical strain 07/22/2014   Postoperative stiffness of total knee replacement (HCC)  08/27/2013   Abnormality of gait 08/22/2012   Cervical spondylosis without myelopathy 08/22/2012   Muscle weakness (generalized) 08/22/2012   Guillain-Barre syndrome (HCC) 08/22/2012   Essential hypertension 08/22/2012   Strabismus 08/22/2012   Degenerative joint disease of knee, left 08/22/2012   Hypothyroidism 08/22/2012   Raynaud's disease 08/22/2012   Knee joint replacement by other means 08/22/2012    PCP: Lonzie Robins, MD  REFERRING PROVIDER: Wilhelmenia Harada, MD  REFERRING DIAG: Tendinitis of left rotator cuff [M75.82]  Arthroscopic extensive debridement - 29823 Supraspinatus Tendon, Anterior Labrum, Superior Labrum, and Posterior Labrum Arthroscopic distal clavicle excision - [address]109 Arthroscopic subacromial decompression - 60454  THERAPY DIAG:  Abnormal posture  Cramp and spasm  Muscle weakness (generalized)  Acute pain of left shoulder  Rationale for Evaluation and Treatment: Rehabilitation  ONSET DATE: DOS 12/18/2023  SUBJECTIVE:                                                                                                                                                                                      SUBJECTIVE STATEMENT: Pt reports certain movements remain painful, especially reaching behind her back. Has nerve conduction study done ~2 days ago.    Initial evaluation:  Pt presents to PT post op L shoulder arthroscopy with extensive debridement, distal clavicle resection/ acromioplasty. She reports some mild pain in her L shoulder since surgery but thinks it is due to the incisions rubbing against the dressing. She denies any issues with sleeping. She continues to have numbness in her fingers from her recent nerve block. Prior to sx, she was unable to reach [facility], out to the side, and had difficulty dressing her bed.   Hand dominance: Right  PERTINENT HISTORY: HTN, GERD, Gullian- Barre syndrome.   PAIN:  Are you having pain? yes: NPRS scale:  2/10 Pain location:  Pain description: Aggravating factors:  Relieving factors:    PRECAUTIONS: Shoulder  RED FLAGS: None   WEIGHT BEARING RESTRICTIONS: No  FALLS:  Has patient fallen in last 6 months? No  LIVING ENVIRONMENT: Lives with: lives with their spouse Lives in: House/apartment Stairs: Yes: Internal: 1 flight steps; on right  going up and External: 4-5 steps; bilateral but cannot reach both Has following equipment at home: shower chair and Grab bars  OCCUPATION: Retired   PLOF: Independent  PATIENT GOALS: Pt would like to be able to get back to functional mobility and get back to working out.   NEXT MD VISIT: 12/26/2023  OBJECTIVE:  Note: Objective measures were completed at Evaluation unless otherwise noted.  DIAGNOSTIC FINDINGS:  1. Mild supraspinatus tendinosis with a tiny insertional interstitial tear. 2. Moderate infraspinatus tendinosis.    PATIENT SURVEYS:  Neldon Mc Next session.  4/17: 34%  COGNITION: Overall cognitive status: Within functional limits for tasks assessed     SENSATION: WFL  POSTURE: Pt is present in sling.   UPPER EXTREMITY ROM:   Passive ROM Right eval Left eval Left  01/09/24 Lt AROM 4/10 L AROM 4/17  Shoulder flexion   AAROM supine with cane, 155 136 160  Shoulder extension     158 (deviates into scaption)  Shoulder abduction       Shoulder adduction       Shoulder internal rotation     T6  Shoulder external rotation     T2 (equal to R)  (Blank rows = not tested)  UPPER EXTREMITY MMT:  MMT Right eval Left eval R 4/17 L 4/17  Shoulder flexion   4/5 4+/5  Shoulder extension      Shoulder abduction   4+/5 4/5  Shoulder adduction      Shoulder internal rotation   5/5 5/5  Shoulder external rotation   4+/5 4+/5  Middle trapezius      Lower trapezius      Elbow flexion      Elbow extension      Wrist flexion      Wrist extension      Wrist ulnar deviation      Wrist radial deviation      Wrist  pronation      Wrist supination      Grip strength (lbs)      (Blank rows = not tested)                                                                                                                              TREATMENT Treatment                            4/17: Blank lines following charge title = not provided on this treatment date.   Manual:  TPDN No STM upper traps, scalenes There-ex: UT stretch L Scalene stretch L Updated MMT and ROM Standing shoulder flexion 3# x10, 2# x10 Standing press out 2# 2x10 Standing active abduction 1# 2x10  There-Act:  Self Care:  Nuro-Re-ed:     Treatment  4/14: Blank lines following charge title = not provided on this treatment date.   Manual:  TPDN No STM upper traps Cervical distraction STM subscap There-ex: Seated round back, + OH reach, + forward serve, + ER Supine overhead flexion- able to achieve 140 deg There-Act:  Self Care:  Nuro-Re-ed:  Gait Training:   Treatment                            4/10: Blank lines following charge title = not provided on this treatment date.   Manual:  TPDN YES Trigger Point Dry Needling  Initial Treatment: Pt instructed on Dry Needling rational, procedures, and possible side effects. Pt instructed to expect mild to moderate muscle soreness later in the day and/or into the next day.  Pt instructed in methods to reduce muscle soreness. Pt instructed to continue prescribed HEP. Because Dry Needling was performed over or adjacent to a lung field, pt was educated on S/S of pneumothorax and to seek immediate medical attention should they occur.  Patient verbalized understanding of these instructions and education.   Patient Verbal Consent Given: Yes Education Handout Provided: Yes- in pt instructions Muscles Treated: Lt upper trap Electrical Stimulation Performed: No Treatment Response/Outcome: twitch with decr spasm Prone first rib depression & gross  rib PA There-ex: Review of HEP Pulleys flexion & abd Dead lift with scap retraction Wall slide + liftoff Prone scap retraction+UE ext; prone Y There-Act:  Self Care:  Nuro-Re-ed:  Gait Training:    Treatment                            4/7: Blank lines following charge title = not provided on this treatment date.   Manual:  TPDN No STM subscap, lats Cervical musculature on the left- suboccipital release There-ex: Supine flexion with wand- PT blocking scapula Sidelying ER Sidelying abduction Sidelying horizontal abduction There-Act:  Self Care:  Nuro-Re-ed:  Gait Training:      PATIENT EDUCATION: Education details: HEP,  and POC. Person educated: Patient Education method: Medical illustrator, handout Education comprehension: verbalized understanding and returned demonstration  HOME EXERCISE PROGRAM: Access Code: [email or web address] URL: [email or web address]   ASSESSMENT:   CLINICAL IMPRESSION:   Pt has attended 10 visits of PT thus far and is making steady progress. Pt reports 85% improved since starting PT. Continues to modify exercise classes. Reports some "impingement" feeling with end range shoulder flexion in standing. Remains limited by soft tissue tension surrounding area. Pt has met all STG at this time and is working towards LTG. Pt will benefit from continued PT to address ongoing limitations and return to pre-surgical level.    Initial impression: Patient referred to PT for s/p L shoulder arthroscopy with extensive debridement, distal clavicle resection/ acromioplasty. Changed dressings today. Surgical site appears clean with no redness or major swelling noted. She denies any significant pain. Pt provided initial HEP. Patient will benefit from skilled PT to address below impairments, limitations and improve overall function.  OBJECTIVE IMPAIRMENTS: decreased activity tolerance, decreased shoulder mobility, decreased ROM, decreased  strength, impaired flexibility, impaired UE use, postural dysfunction, and pain.  ACTIVITY LIMITATIONS: reaching, lifting, carry,  cleaning, driving, and or occupation  PERSONAL FACTORS:  also affecting patient's functional outcome.  REHAB POTENTIAL: Good  CLINICAL DECISION MAKING: Stable/uncomplicated  EVALUATION COMPLEXITY: Low    GOALS: Short term PT Goals Target date: 02/17/2024 Pt will be I and compliant with  HEP. Baseline:  Goal status: MET 4/17 Pt will decrease pain by 25% overall Baseline: Goal status: MET 4/17  Long term PT goals Target date: 03/14/2024 Pt will improve Lt shoulder AROM to [facility] to improve functional reaching Baseline: Goal status: IN PROGRESS 4/17 Pt will improve  Lt shoulder strength to at least 4+/5 MMT to improve functional strength Baseline: Goal status: IN PROGRESS 4/17 Pt will improve Quick Dash by 14 points to have significant change per MCID.  Baseline: Goal status:IN PROGRESS 4/17  Pt will reduce pain to overall less than 1-2/10 with usual activity and work activity. Baseline: Goal status: IN PROGRESS 4/17      5. Pt will return to recreational activities without significant pain or limitations.   Baseline:   Goal Status: IN PROGRESS 4/17  PLAN: PT FREQUENCY: 1-2 x per week.   PT DURATION: 8-12 weeks.   PLANNED INTERVENTIONS (unless contraindicated): aquatic PT, Canalith repositioning, cryotherapy, Electrical stimulation, Iontophoresis with 4 mg/ml dexamethasome, Moist heat, traction, Ultrasound, gait training, Therapeutic exercise, balance training, neuromuscular re-education, patient/family education, prosthetic training, manual techniques, passive ROM, dry needling, taping, vasopnuematic device, vestibular, spinal manipulations, joint manipulations  PLAN FOR NEXT SESSION: review/update HEP, progress per protocol.   Herb Loges, PTA  01/31/24 2:24 PM    [facility] [address] [place], [place], [address] Phone: [phone]   Fax:  [phone]

## 2024-02-04 ENCOUNTER — Ambulatory Visit: Payer: Medicare HMO | Admitting: Podiatry

## 2024-02-04 ENCOUNTER — Ambulatory Visit (HOSPITAL_BASED_OUTPATIENT_CLINIC_OR_DEPARTMENT_OTHER)

## 2024-02-04 ENCOUNTER — Encounter: Payer: Self-pay | Admitting: Podiatry

## 2024-02-04 ENCOUNTER — Encounter (HOSPITAL_BASED_OUTPATIENT_CLINIC_OR_DEPARTMENT_OTHER): Payer: Self-pay

## 2024-02-04 ENCOUNTER — Encounter: Payer: Self-pay | Admitting: Neurology

## 2024-02-04 VITALS — Ht 65.0 in | Wt 117.0 lb

## 2024-02-04 DIAGNOSIS — B351 Tinea unguium: Secondary | ICD-10-CM

## 2024-02-04 DIAGNOSIS — R252 Cramp and spasm: Secondary | ICD-10-CM | POA: Diagnosis not present

## 2024-02-04 DIAGNOSIS — R293 Abnormal posture: Secondary | ICD-10-CM | POA: Diagnosis not present

## 2024-02-04 DIAGNOSIS — M79675 Pain in left toe(s): Secondary | ICD-10-CM

## 2024-02-04 DIAGNOSIS — M216X2 Other acquired deformities of left foot: Secondary | ICD-10-CM | POA: Diagnosis not present

## 2024-02-04 DIAGNOSIS — M79674 Pain in right toe(s): Secondary | ICD-10-CM | POA: Diagnosis not present

## 2024-02-04 DIAGNOSIS — M25512 Pain in left shoulder: Secondary | ICD-10-CM | POA: Diagnosis not present

## 2024-02-04 DIAGNOSIS — M6281 Muscle weakness (generalized): Secondary | ICD-10-CM

## 2024-02-04 LAB — CBC WITH DIFFERENTIAL/PLATELET
Basophils Absolute: 0.1 10*3/uL (ref 0.0–0.2)
Basos: 1 %
EOS (ABSOLUTE): 0.1 10*3/uL (ref 0.0–0.4)
Eos: 1 %
Hematocrit: 42.7 % (ref 34.0–46.6)
Hemoglobin: 14.3 g/dL (ref 11.1–15.9)
Immature Grans (Abs): 0 10*3/uL (ref 0.0–0.1)
Immature Granulocytes: 0 %
Lymphocytes Absolute: 1.3 10*3/uL (ref 0.7–3.1)
Lymphs: 21 %
MCH: 33.2 pg — ABNORMAL HIGH (ref 26.6–33.0)
MCHC: 33.5 g/dL (ref 31.5–35.7)
MCV: 99 fL — ABNORMAL HIGH (ref 79–97)
Monocytes Absolute: 0.5 10*3/uL (ref 0.1–0.9)
Monocytes: 7 %
Neutrophils Absolute: 4.3 10*3/uL (ref 1.4–7.0)
Neutrophils: 70 %
Platelets: 176 10*3/uL (ref 150–450)
RBC: 4.31 x10E6/uL (ref 3.77–5.28)
RDW: 12.2 % (ref 11.7–15.4)
WBC: 6.2 10*3/uL (ref 3.4–10.8)

## 2024-02-04 LAB — MULTIPLE MYELOMA PANEL, SERUM
Albumin SerPl Elph-Mcnc: 4.6 g/dL — ABNORMAL HIGH (ref 2.9–4.4)
Albumin/Glob SerPl: 2.2 — ABNORMAL HIGH (ref 0.7–1.7)
Alpha 1: 0.2 g/dL (ref 0.0–0.4)
Alpha2 Glob SerPl Elph-Mcnc: 0.5 g/dL (ref 0.4–1.0)
B-Globulin SerPl Elph-Mcnc: 0.8 g/dL (ref 0.7–1.3)
Gamma Glob SerPl Elph-Mcnc: 0.6 g/dL (ref 0.4–1.8)
Globulin, Total: 2.1 g/dL — ABNORMAL LOW (ref 2.2–3.9)
IgA/Immunoglobulin A, Serum: 60 mg/dL — ABNORMAL LOW (ref 64–422)
IgG (Immunoglobin G), Serum: 676 mg/dL (ref 586–1602)
IgM (Immunoglobulin M), Srm: 72 mg/dL (ref 26–217)

## 2024-02-04 LAB — COMPREHENSIVE METABOLIC PANEL WITH GFR
ALT: 19 IU/L (ref 0–32)
AST: 26 IU/L (ref 0–40)
Albumin: 4.7 g/dL (ref 3.8–4.8)
Alkaline Phosphatase: 97 IU/L (ref 44–121)
BUN/Creatinine Ratio: 25 (ref 12–28)
BUN: 20 mg/dL (ref 8–27)
Bilirubin Total: 0.3 mg/dL (ref 0.0–1.2)
CO2: 26 mmol/L (ref 20–29)
Calcium: 10.1 mg/dL (ref 8.7–10.3)
Chloride: 106 mmol/L (ref 96–106)
Creatinine, Ser: 0.81 mg/dL (ref 0.57–1.00)
Globulin, Total: 2 g/dL (ref 1.5–4.5)
Glucose: 90 mg/dL (ref 70–99)
Potassium: 4.1 mmol/L (ref 3.5–5.2)
Sodium: 146 mmol/L — ABNORMAL HIGH (ref 134–144)
Total Protein: 6.7 g/dL (ref 6.0–8.5)
eGFR: 77 mL/min/{1.73_m2} (ref >59)

## 2024-02-04 LAB — SEDIMENTATION RATE: Sed Rate: 2 mm/h (ref 0–40)

## 2024-02-04 LAB — CK: Total CK: 94 U/L (ref 32–182)

## 2024-02-04 LAB — TSH: TSH: 0.66 u[IU]/mL (ref 0.450–4.500)

## 2024-02-04 LAB — ANA W/REFLEX IF POSITIVE: Anti Nuclear Antibody (ANA): NEGATIVE

## 2024-02-04 LAB — VITAMIN B12: Vitamin B-12: 516 pg/mL (ref 232–1245)

## 2024-02-04 LAB — RPR: RPR Ser Ql: NONREACTIVE

## 2024-02-04 LAB — C-REACTIVE PROTEIN: CRP: 2 mg/L (ref 0–10)

## 2024-02-04 LAB — HGB A1C W/O EAG: Hgb A1c MFr Bld: 5.9 % — ABNORMAL HIGH (ref 4.8–5.6)

## 2024-02-04 LAB — VITAMIN D 25 HYDROXY (VIT D DEFICIENCY, FRACTURES): Vit D, 25-Hydroxy: 51.1 ng/mL (ref 30.0–100.0)

## 2024-02-04 NOTE — Progress Notes (Signed)
 Chief Complaint  Patient presents with   Nail Problem    Patient is here for routine foot care    SUBJECTIVE Patient presents to office today complaining of elongated, thickened nails that cause pain while ambulating in shoes.  Patient is unable to trim their own nails.  She states that she has pain from chronic ingrown toenails.  She also has been developing pain and tenderness to the plantar aspect of the left forefoot.  She has a history of surgery to the left foot.  Patient is here for further evaluation and treatment.  Past Medical History:  Diagnosis Date   Allergy    Anxiety    Asthma    INFREQUENT PROBLEM - rarely used inhaler   CAD (coronary artery disease) 06/29/2023   CCTA 06/27/23: CAC score 15.7 (45th percentile), mild nonobstructive CAD [mid LAD 1-24, proximal LCx 1-24]    Cervical spondylosis    Cervical strain 07/22/2014   Chest pain of uncertain etiology 12/13/2021   Degenerative arthritis    Dyslipidemia    Eczema    GERD (gastroesophageal reflux disease)    Guillain-Barre syndrome (HCC)    History of Guillain-Barre syndrome 07/22/2014   Hyperlipidemia    Hypertension    Hypothyroidism    Lumbosacral spondylosis    Palpitations 12/12/2021   Pure hypercholesterolemia 12/13/2021   Raynaud disease    Thyroid  nodule     Allergies  Allergen Reactions   Captopril Other (See Comments)    reaction   Oxaprozin Hives and Other (See Comments)   Ramipril Cough and Other (See Comments)   Zoloft [Sertraline Hcl] Diarrhea   Ace Inhibitors     Other reaction(s): Other (See Comments) GB syndrome   Doxycycline Other (See Comments)    Burning sensation/Rash   Influenza Vac Split Quad     Other Reaction(s): Guillain-Barr syndrome   Influenza Vaccines     HX    Sulfa Antibiotics Other (See Comments)    unknown     OBJECTIVE General Patient is awake, alert, and oriented x 3 and in no acute distress. Derm Skin is dry and supple bilateral. Negative open  lesions or macerations. Remaining integument unremarkable. Nails are tender, long, thickened and dystrophic with subungual debris, consistent with onychomycosis, 1-5 bilateral. No signs of infection noted. Vasc  DP and PT pedal pulses palpable bilaterally. Temperature gradient within normal limits.  Neuro grossly intact via light touch Musculoskeletal Exam high arches noted to the left foot compared to the contralateral limb with tenderness to palpation to the plantar aspect of the first MTP of the left foot  ASSESSMENT 1.  Pain due to onychomycosis of toenails both 2.  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  -Instructed to maintain good pedal hygiene and foot care.  -Mechanical debridement of nails 1-5 bilaterally performed using a nail nipper. Filed with dremel without incident.  -Recommend arch supports to support the medial longitudinal arch of the foot and offload pressure from the forefoot.  Patient has orthotics at home -Also offloading felt dancers pad was applied to the insole of the left foot to offload pressure from the first MTP -Advise against going barefoot. -Return to clinic for routine footcare   Dot Gazella, DPM [facility]  Dr. Dot Gazella, DPM    [address]  [place], [place] [address]                Office [phone]  Fax [phone]

## 2024-02-04 NOTE — Therapy (Signed)
 OUTPATIENT PHYSICAL THERAPY SHOULDER TREATMENT      Patient Name: Brianna Gates MRN: [id number] DOB:[birth year], 73 y.o., female Today's Date: 02/04/2024  END OF SESSION:  PT End of Session - 02/04/24 1355     Visit Number 11    Date for PT Re-Evaluation 03/14/24    Authorization Type [employer]    Progress Note Due on Visit 20    PT Start Time 1353    PT Stop Time 1428    PT Time Calculation (min) 35 min    Activity Tolerance Patient tolerated treatment well    Behavior During Therapy WFL for tasks assessed/performed                  Past Medical History:  Diagnosis Date   Allergy    Anxiety    Asthma    INFREQUENT PROBLEM - rarely used inhaler   CAD (coronary artery disease) 06/29/2023   CCTA 06/27/23: CAC score 15.7 (45th percentile), mild nonobstructive CAD [mid LAD 1-24, proximal LCx 1-24]    Cervical spondylosis    Cervical strain 07/22/2014   Chest pain of uncertain etiology 12/13/2021   Degenerative arthritis    Dyslipidemia    Eczema    GERD (gastroesophageal reflux disease)    Guillain-Barre syndrome (HCC)    History of Guillain-Barre syndrome 07/22/2014   Hyperlipidemia    Hypertension    Hypothyroidism    Lumbosacral spondylosis    Palpitations 12/12/2021   Pure hypercholesterolemia 12/13/2021   Raynaud disease    Thyroid  nodule    Past Surgical History:  Procedure Laterality Date   ABDOMINAL HYSTERECTOMY     COLONOSCOPY     ESOPHAGOGASTRODUODENOSCOPY (EGD) WITH PROPOFOL  N/A 09/13/2021   Procedure: ESOPHAGOGASTRODUODENOSCOPY (EGD) WITH PROPOFOL ;  Surgeon: Tobin Forts, MD;  Location: [facility];  Service: Endoscopy;  Laterality: N/A;   FOOT SURGERY Left    GANGLION CYST EXCISION Left    Wrist   HERNIA REPAIR     KNEE ARTHROSCOPY     RT KNEE   KNEE ARTHROTOMY Right 08/27/2013   Procedure: RIGHT KNEE SCAR EXCISION AND FEMORAL REVISION;  Surgeon: Aurther Blue, MD;  Location: [facility];  Service: Orthopedics;  Laterality:  Right;   REPLACEMENT TOTAL KNEE Right    2014, 2021   REPLACEMENT TOTAL KNEE BILATERAL Bilateral    SHOULDER ARTHROSCOPY WITH DISTAL CLAVICLE RESECTION Left 12/18/2023   Procedure: LEFT SHOULDER ARTHROSCOPY WITH EXTENSIVE DEBRIDEMENT, DISTAL CLAVICLE RESECTION / ACROMIOPLASTY;  Surgeon: Wilhelmenia Harada, MD;  Location: [facility];  Service: Orthopedics;  Laterality: Left;   STRABISMUS SURGERY Right    TONSILLECTOMY     vaginal vulvo prolapse  09/16/2015   Patient Active Problem List   Diagnosis Date Noted   Anxiety 01/30/2024   Peripheral neuropathy 01/30/2024   Tendinitis of left rotator cuff 12/18/2023   Arthritis of left acromioclavicular joint 12/18/2023   CAD (coronary artery disease) 06/29/2023   Paresthesia 10/18/2022   Onychomycosis 01/29/2022   Chest pain of uncertain etiology 12/13/2021   Pure hypercholesterolemia 12/13/2021   Palpitations 12/12/2021   Esophageal dysphagia    Allergic rhinitis 04/04/2021   Allergic rhinitis due to animal (cat) (dog) hair and dander 04/04/2021   Allergic rhinitis due to pollen 04/04/2021   Hormone deficiency 04/04/2021   Syncope and collapse 12/27/2020   Hypotension 12/27/2020   Bradycardia 12/27/2020   History of COVID-19 12/27/2020   Preoperative evaluation to rule out surgical contraindication 07/16/2020   Arthrofibrosis of knee joint, right  11/26/2019   Arthritis of knee 07/30/2019   Presence of right artificial knee joint 07/30/2019   Sleep apnea 10/16/2018   Shoulder impingement syndrome 01/15/2018   Gastroesophageal reflux disease 02/02/2016   Sensorineural hearing loss (SNHL), bilateral 02/02/2016   Vaginal vault prolapse after hysterectomy 07/28/2015   History of Guillain-Barre syndrome 07/22/2014   Cervical strain 07/22/2014   Postoperative stiffness of total knee replacement (HCC) 08/27/2013   Abnormality of gait 08/22/2012   Cervical spondylosis without myelopathy 08/22/2012   Muscle weakness (generalized)  08/22/2012   Guillain-Barre syndrome (HCC) 08/22/2012   Essential hypertension 08/22/2012   Strabismus 08/22/2012   Degenerative joint disease of knee, left 08/22/2012   Hypothyroidism 08/22/2012   Raynaud's disease 08/22/2012   Knee joint replacement by other means 08/22/2012    PCP: Lonzie Robins, MD  REFERRING PROVIDER: Wilhelmenia Harada, MD  REFERRING DIAG: Tendinitis of left rotator cuff [M75.82]  Arthroscopic extensive debridement - 29823 Supraspinatus Tendon, Anterior Labrum, Superior Labrum, and Posterior Labrum Arthroscopic distal clavicle excision - 40981 Arthroscopic subacromial decompression - 19147  THERAPY DIAG:  Abnormal posture  Cramp and spasm  Acute pain of left shoulder  Muscle weakness (generalized)  Rationale for Evaluation and Treatment: Rehabilitation  ONSET DATE: DOS 12/18/2023  SUBJECTIVE:                                                                                                                                                                                      SUBJECTIVE STATEMENT: Pt reports no new complaints. Shoulder hurts depending on what she does.    Initial evaluation:  Pt presents to PT post op L shoulder arthroscopy with extensive debridement, distal clavicle resection/ acromioplasty. She reports some mild pain in her L shoulder since surgery but thinks it is due to the incisions rubbing against the dressing. She denies any issues with sleeping. She continues to have numbness in her fingers from her recent nerve block. Prior to sx, she was unable to reach [facility], out to the side, and had difficulty dressing her bed.   Hand dominance: Right  PERTINENT HISTORY: HTN, GERD, Gullian- Barre syndrome.   PAIN:  Are you having pain? yes: NPRS scale: 2/10 Pain location:  Pain description: Aggravating factors:  Relieving factors:    PRECAUTIONS: Shoulder  RED FLAGS: None   WEIGHT BEARING RESTRICTIONS: No  FALLS:  Has patient  fallen in last 6 months? No  LIVING ENVIRONMENT: Lives with: lives with their spouse Lives in: House/apartment Stairs: Yes: Internal: 1 flight steps; on right going up and External: 4-5 steps; bilateral but cannot reach both Has following equipment at home: shower chair and Grab bars  OCCUPATION: Retired  PLOF: Independent  PATIENT GOALS: Pt would like to be able to get back to functional mobility and get back to working out.   NEXT MD VISIT: 12/26/2023  OBJECTIVE:  Note: Objective measures were completed at Evaluation unless otherwise noted.  DIAGNOSTIC FINDINGS:  1. Mild supraspinatus tendinosis with a tiny insertional interstitial tear. 2. Moderate infraspinatus tendinosis.    PATIENT SURVEYS:  Cindia Crease Next session.  4/17: 34%  COGNITION: Overall cognitive status: Within functional limits for tasks assessed     SENSATION: WFL  POSTURE: Pt is present in sling.   UPPER EXTREMITY ROM:   Passive ROM Right eval Left eval Left  01/09/24 Lt AROM 4/10 L AROM 4/17  Shoulder flexion   AAROM supine with cane, 155 136 160  Shoulder extension     158 (deviates into scaption)  Shoulder abduction       Shoulder adduction       Shoulder internal rotation     T6  Shoulder external rotation     T2 (equal to R)  (Blank rows = not tested)  UPPER EXTREMITY MMT:  MMT Right eval Left eval R 4/17 L 4/17  Shoulder flexion   4/5 4+/5  Shoulder extension      Shoulder abduction   4+/5 4/5  Shoulder adduction      Shoulder internal rotation   5/5 5/5  Shoulder external rotation   4+/5 4+/5  Middle trapezius      Lower trapezius      Elbow flexion      Elbow extension      Wrist flexion      Wrist extension      Wrist ulnar deviation      Wrist radial deviation      Wrist pronation      Wrist supination      Grip strength (lbs)      (Blank rows = not tested)                                                                                                                                 TREATMENT                                                                                                                            TREATMENT Treatment  4/21: Blank lines following charge title = not provided on this treatment date.   Manual:  TPDN No STM upper traps, scalenes There-ex: PROM L shoulder Standing shoulder flexion 2# 2x10,  Standing press out 2# 2x10 Standing active abduction 2# 2x10 Wall push up (partial) 2x10 There-Act:  Self Care:  Nuro-Re-ed: Ball rolls at wall 4way 2.2# ball x15ea Theraband row GTB 2x15 Theraband extension 2x10 Treatment                            4/17: Blank lines following charge title = not provided on this treatment date.   Manual:  TPDN No STM upper traps, scalenes There-ex: UT stretch L Scalene stretch L Updated MMT and ROM Standing shoulder flexion 3# x10, 2# x10 Standing press out 2# 2x10 Standing active abduction 1# 2x10  There-Act:  Self Care:  Nuro-Re-ed:     Treatment                            4/14: Blank lines following charge title = not provided on this treatment date.   Manual:  TPDN No STM upper traps Cervical distraction STM subscap There-ex: Seated round back, + OH reach, + forward serve, + ER Supine overhead flexion- able to achieve 140 deg There-Act:  Self Care:  Nuro-Re-ed:  Gait Training:   Treatment                            4/10: Blank lines following charge title = not provided on this treatment date.   Manual:  TPDN YES Trigger Point Dry Needling  Initial Treatment: Pt instructed on Dry Needling rational, procedures, and possible side effects. Pt instructed to expect mild to moderate muscle soreness later in the day and/or into the next day.  Pt instructed in methods to reduce muscle soreness. Pt instructed to continue prescribed HEP. Because Dry Needling was performed over or adjacent to a lung field, pt was educated on  S/S of pneumothorax and to seek immediate medical attention should they occur.  Patient verbalized understanding of these instructions and education.   Patient Verbal Consent Given: Yes Education Handout Provided: Yes- in pt instructions Muscles Treated: Lt upper trap Electrical Stimulation Performed: No Treatment Response/Outcome: twitch with decr spasm Prone first rib depression & gross rib PA There-ex: Review of HEP Pulleys flexion & abd Dead lift with scap retraction Wall slide + liftoff Prone scap retraction+UE ext; prone Y There-Act:  Self Care:  Nuro-Re-ed:  Gait Training:    Treatment                            4/7: Blank lines following charge title = not provided on this treatment date.   Manual:  TPDN No STM subscap, lats Cervical musculature on the left- suboccipital release There-ex: Supine flexion with wand- PT blocking scapula Sidelying ER Sidelying abduction Sidelying horizontal abduction There-Act:     PATIENT EDUCATION: Education details: HEP,  and POC. Person educated: Patient Education method: Medical illustrator, handout Education comprehension: verbalized understanding and returned demonstration  HOME EXERCISE PROGRAM: Access Code: [email or web address] URL: [email or web address]   ASSESSMENT:   CLINICAL IMPRESSION:   Pt progressing well with NMC and strengthening interventions. Continued with MT to address ongoing tightness in L SCM, scalenes, and cervical ps. She denied discomfort with exercises today, though  challenge is present with AROM in standing.   Initial impression: Patient referred to PT for s/p L shoulder arthroscopy with extensive debridement, distal clavicle resection/ acromioplasty. Changed dressings today. Surgical site appears clean with no redness or major swelling noted. She denies any significant pain. Pt provided initial HEP. Patient will benefit from skilled PT to address below impairments, limitations  and improve overall function.  OBJECTIVE IMPAIRMENTS: decreased activity tolerance, decreased shoulder mobility, decreased ROM, decreased strength, impaired flexibility, impaired UE use, postural dysfunction, and pain.  ACTIVITY LIMITATIONS: reaching, lifting, carry,  cleaning, driving, and or occupation  PERSONAL FACTORS:  also affecting patient's functional outcome.  REHAB POTENTIAL: Good  CLINICAL DECISION MAKING: Stable/uncomplicated  EVALUATION COMPLEXITY: Low    GOALS: Short term PT Goals Target date: 02/17/2024 Pt will be I and compliant with HEP. Baseline:  Goal status: MET 4/17 Pt will decrease pain by 25% overall Baseline: Goal status: MET 4/17  Long term PT goals Target date: 03/14/2024 Pt will improve Lt shoulder AROM to [facility] to improve functional reaching Baseline: Goal status: IN PROGRESS 4/17 Pt will improve  Lt shoulder strength to at least 4+/5 MMT to improve functional strength Baseline: Goal status: IN PROGRESS 4/17 Pt will improve Quick Dash by 14 points to have significant change per MCID.  Baseline: Goal status:IN PROGRESS 4/17  Pt will reduce pain to overall less than 1-2/10 with usual activity and work activity. Baseline: Goal status: IN PROGRESS 4/17      5. Pt will return to recreational activities without significant pain or limitations.   Baseline:   Goal Status: IN PROGRESS 4/17  PLAN: PT FREQUENCY: 1-2 x per week.   PT DURATION: 8-12 weeks.   PLANNED INTERVENTIONS (unless contraindicated): aquatic PT, Canalith repositioning, cryotherapy, Electrical stimulation, Iontophoresis with 4 mg/ml dexamethasome, Moist heat, traction, Ultrasound, gait training, Therapeutic exercise, balance training, neuromuscular re-education, patient/family education, prosthetic training, manual techniques, passive ROM, dry needling, taping, vasopnuematic device, vestibular, spinal manipulations, joint manipulations  PLAN FOR NEXT SESSION: review/update HEP,  progress per protocol.   Herb Loges, PTA  02/04/24 4:35 PM    [facility] [address] [place], [place], [address] Phone: [phone]   Fax:  [phone]

## 2024-02-05 DIAGNOSIS — E049 Nontoxic goiter, unspecified: Secondary | ICD-10-CM | POA: Diagnosis not present

## 2024-02-05 DIAGNOSIS — E039 Hypothyroidism, unspecified: Secondary | ICD-10-CM | POA: Diagnosis not present

## 2024-02-05 DIAGNOSIS — R7301 Impaired fasting glucose: Secondary | ICD-10-CM | POA: Diagnosis not present

## 2024-02-05 DIAGNOSIS — E7800 Pure hypercholesterolemia, unspecified: Secondary | ICD-10-CM | POA: Diagnosis not present

## 2024-02-06 ENCOUNTER — Ambulatory Visit (HOSPITAL_BASED_OUTPATIENT_CLINIC_OR_DEPARTMENT_OTHER): Admitting: Orthopaedic Surgery

## 2024-02-06 DIAGNOSIS — M7582 Other shoulder lesions, left shoulder: Secondary | ICD-10-CM

## 2024-02-06 NOTE — Progress Notes (Signed)
 Chief Complaint: Left shoulder acromioplasty with distal clavicectomy 12/18/23     History of Present Illness:   02/06/2024: Presents 6 weeks status post the above procedure.  Overall she is doing well.  At this time she is continuing to improve.  She is experiencing some pain about the cervical spine.  This has been somewhat of a chronic injury and she is working through physical therapy on this with limited relief   PMH/PSH/Family History/Social History/Meds/Allergies:    Past Medical History:  Diagnosis Date   Allergy    Anxiety    Asthma    INFREQUENT PROBLEM - rarely used inhaler   CAD (coronary artery disease) 06/29/2023   CCTA 06/27/23: CAC score 15.7 (45th percentile), mild nonobstructive CAD [mid LAD 1-24, proximal LCx 1-24]    Cervical spondylosis    Cervical strain 07/22/2014   Chest pain of uncertain etiology 12/13/2021   Degenerative arthritis    Dyslipidemia    Eczema    GERD (gastroesophageal reflux disease)    Guillain-Barre syndrome (HCC)    History of Guillain-Barre syndrome 07/22/2014   Hyperlipidemia    Hypertension    Hypothyroidism    Lumbosacral spondylosis    Palpitations 12/12/2021   Pure hypercholesterolemia 12/13/2021   Raynaud disease    Thyroid  nodule    Past Surgical History:  Procedure Laterality Date   ABDOMINAL HYSTERECTOMY     COLONOSCOPY     ESOPHAGOGASTRODUODENOSCOPY (EGD) WITH PROPOFOL  N/A 09/13/2021   Procedure: ESOPHAGOGASTRODUODENOSCOPY (EGD) WITH PROPOFOL ;  Surgeon: Tobin Forts, MD;  Location: [facility];  Service: Endoscopy;  Laterality: N/A;   FOOT SURGERY Left    GANGLION CYST EXCISION Left    Wrist   HERNIA REPAIR     KNEE ARTHROSCOPY     RT KNEE   KNEE ARTHROTOMY Right 08/27/2013   Procedure: RIGHT KNEE SCAR EXCISION AND FEMORAL REVISION;  Surgeon: Aurther Blue, MD;  Location: [facility];  Service: Orthopedics;  Laterality: Right;   REPLACEMENT TOTAL KNEE Right    2014, 2021   REPLACEMENT TOTAL KNEE BILATERAL  Bilateral    SHOULDER ARTHROSCOPY WITH DISTAL CLAVICLE RESECTION Left 12/18/2023   Procedure: LEFT SHOULDER ARTHROSCOPY WITH EXTENSIVE DEBRIDEMENT, DISTAL CLAVICLE RESECTION / ACROMIOPLASTY;  Surgeon: Wilhelmenia Harada, MD;  Location: [facility];  Service: Orthopedics;  Laterality: Left;   STRABISMUS SURGERY Right    TONSILLECTOMY     vaginal vulvo prolapse  09/16/2015   Social History   Socioeconomic History   Marital status: Married    Spouse name: Not on file   Number of children: 2   Years of education: college   Highest education level: Not on file  Occupational History    Employer: UNEMPLOYED  Tobacco Use   Smoking status: Never   Smokeless tobacco: Never  Vaping Use   Vaping status: Never Used  Substance and Sexual Activity   Alcohol  use: Not Currently    Comment: stopped drinking   Drug use: No   Sexual activity: Yes    Birth control/protection: Other-see comments, Post-menopausal    Comment: Hysterectomy  Other Topics Concern   Not on file  Social History Narrative   Lives at home, married   Patient is right handed.   Patient drinks 2-3 cups caffeine daily.   Social Drivers of Corporate investment banker Strain: Not on file  Food Insecurity: Not on file  Transportation Needs: Not on file  Physical Activity: Not on file  Stress: Not on file  Social  Connections: Not on file   Family History  Problem Relation Age of Onset   Hypertension Mother    Other Mother        Dyslipidemia   Stroke Mother    Heart attack Father        52s   Heart disease Father    Hemochromatosis Father    Hemochromatosis Brother    Hyperlipidemia Brother    Hypertension Brother    Other Brother        Dyslipidemia   Stomach cancer Paternal Uncle    Colon cancer Neg Hx    Rectal cancer Neg Hx    Esophageal cancer Neg Hx    Allergies  Allergen Reactions   Captopril Other (See Comments)    reaction   Oxaprozin Hives and Other (See Comments)   Ramipril Cough  and Other (See Comments)   Zoloft [Sertraline Hcl] Diarrhea   Ace Inhibitors     Other reaction(s): Other (See Comments) GB syndrome   Doxycycline Other (See Comments)    Burning sensation/Rash   Influenza Vac Split Quad     Other Reaction(s): Guillain-Barr syndrome   Influenza Vaccines     HX    Sulfa Antibiotics Other (See Comments)    unknown   Current Outpatient Medications  Medication Sig Dispense Refill   acetaminophen  (TYLENOL ) 650 MG CR tablet Take 1,300 mg by mouth every 8 (eight) hours as needed for pain or fever.     albuterol  (PROVENTIL  HFA;VENTOLIN  HFA) 108 (90 BASE) MCG/ACT inhaler Inhale 2 puffs into the lungs every 6 (six) hours as needed for wheezing or shortness of breath.     amLODipine  (NORVASC ) 2.5 MG tablet Take 1 tablet (2.5 mg total) by mouth every evening. 90 tablet 3   amoxicillin (AMOXIL) 500 MG capsule Take 2,000 mg by mouth once as needed (prior to dental appointments).     Ascorbic Acid (VITAMIN C PO) Take 1 tablet by mouth as needed (sick symptoms).     aspirin  EC 325 MG tablet Take 1 tablet (325 mg total) by mouth daily. 14 tablet 0   aspirin  EC 81 MG tablet Take 81 mg by mouth daily. Swallow whole.     azelastine (ASTELIN) 0.1 % nasal spray Place 1 spray into both nostrils 2 (two) times daily as needed (cold symptoms).     busPIRone (BUSPAR) 30 MG tablet Take 30 mg by mouth 2 (two) times daily.     Cholecalciferol (VITAMIN D ) 2000 UNITS CAPS Take 2,000 Units by mouth daily in the afternoon.     COVID-19 mRNA vaccine 2023-2024 (COMIRNATY ) SUSP injection Inject into the muscle. 0.3 mL 0   COVID-19 mRNA vaccine, Pfizer, (COMIRNATY ) syringe Inject into the muscle. 0.3 mL 0   DULoxetine  (CYMBALTA ) 30 MG capsule Take 1 capsule (30 mg total) by mouth daily. 30 capsule 11   Emollient (CETAPHIL) cream Apply 1 application topically daily.     EPINEPHrine  0.3 mg/0.3 mL IJ SOAJ injection Inject 0.3 mg into the muscle once as needed for anaphylaxis.     estradiol   (ESTRACE ) 0.1 MG/GM vaginal cream Place 1 Applicatorful vaginally 2 (two) times a week.     fexofenadine-pseudoephedrine (ALLEGRA-D 24) 180-240 MG 24 hr tablet Take 1 tablet by mouth daily.     gabapentin  (NEURONTIN ) 400 MG capsule Take 1 capsule (400 mg total) by mouth 3 (three) times daily. 90 capsule 11   ibuprofen (ADVIL,MOTRIN) 100 MG tablet Take 400 mg by mouth daily at 12 noon. May take in  the pm additionally as needed     ketoconazole  (NIZORAL ) 2 % cream Apply 1 application topically daily. (Patient taking differently: Apply 1 application  topically as needed.) 60 g 0   levothyroxine  (SYNTHROID ) 50 MCG tablet Take 50 mcg by mouth daily before breakfast.      losartan  (COZAAR ) 50 MG tablet Take 1 tablet (50 mg total) by mouth in the morning. 90 tablet 3   montelukast  (SINGULAIR ) 10 MG tablet Take 10 mg by mouth at bedtime.     oxyCODONE  (ROXICODONE ) 5 MG immediate release tablet Take 1 tablet (5 mg total) by mouth every 4 (four) hours as needed for severe pain (pain score 7-10) or breakthrough pain. 30 tablet 0   pantoprazole  (PROTONIX ) 40 MG tablet Take 1 tablet (40 mg total) by mouth 2 (two) times daily. Office visit for further refills (Patient taking differently: Take 40 mg by mouth as needed. Office visit for further refills) 180 tablet 0   rosuvastatin  (CRESTOR ) 10 MG tablet Take 1 tablet (10 mg total) by mouth every other day. 90 tablet 3   Simethicone (PHAZYME PO) Take 250 mg by mouth daily as needed (Gas).     triamcinolone  cream (KENALOG ) 0.1 % 1 application as needed (itching bumps on stomach). Mixed with Cetaphil     VEVYE 0.1 % SOLN Apply 1 drop to eye 2 (two) times daily.     No current facility-administered medications for this visit.   No results found.  Review of Systems:   A ROS was performed including pertinent positives and negatives as documented in the HPI.  Physical Exam :   Constitutional: NAD and appears stated age Neurological: Alert and oriented Psych:  Appropriate affect and cooperative There were no vitals taken for this visit.   Comprehensive Musculoskeletal Exam:    Left shoulder incisions are well-appearing without erythema or drainage.  Active forward elevation is 160 compared to 160 on the contralateral side.  External rotation is to 30 degrees compared to 50 on the contralateral side.  Internal rotation deferred today   Imaging:    I personally reviewed and interpreted the radiographs.   Assessment and Plan:   73 y.o. female status post acromioplasty and arthroscopic debridement.  She is continuing to improve significantly.  At this time I would like to plan for a referral to Dr. Daisey Dryer for discussion of possible facet denervation of the cervical spine as she is having persistent neck based symptoms in the setting of chronic cervical pain   -Plan for referral to Dr. Daisey Dryer for discussion of cervical facet denervation   I personally saw and evaluated the patient, and participated in the management and treatment plan.  Wilhelmenia Harada, MD Attending Physician, Orthopedic Surgery  This document was dictated using Dragon voice recognition software. A reasonable attempt at proof reading has been made to minimize errors.

## 2024-02-06 NOTE — Addendum Note (Signed)
 Addended by: Albesa Huguenin on: 02/06/2024 12:38 PM   Modules accepted: Orders

## 2024-02-07 ENCOUNTER — Ambulatory Visit (HOSPITAL_BASED_OUTPATIENT_CLINIC_OR_DEPARTMENT_OTHER)

## 2024-02-07 ENCOUNTER — Encounter (HOSPITAL_BASED_OUTPATIENT_CLINIC_OR_DEPARTMENT_OTHER): Payer: Self-pay

## 2024-02-07 DIAGNOSIS — R293 Abnormal posture: Secondary | ICD-10-CM | POA: Diagnosis not present

## 2024-02-07 DIAGNOSIS — M6281 Muscle weakness (generalized): Secondary | ICD-10-CM | POA: Diagnosis not present

## 2024-02-07 DIAGNOSIS — R252 Cramp and spasm: Secondary | ICD-10-CM

## 2024-02-07 DIAGNOSIS — M25512 Pain in left shoulder: Secondary | ICD-10-CM | POA: Diagnosis not present

## 2024-02-07 NOTE — Therapy (Signed)
 OUTPATIENT PHYSICAL THERAPY SHOULDER TREATMENT      Patient Name: Brianna Gates MRN: [id number] DOB:04-08-1951, 73 y.o., female Today's Date: 02/07/2024  END OF SESSION:  PT End of Session - 02/07/24 1303     Visit Number 12    Date for PT Re-Evaluation 03/14/24    Authorization Type [employer]    Progress Note Due on Visit 20    PT Start Time 1303    PT Stop Time 1342    PT Time Calculation (min) 39 min    Activity Tolerance Patient tolerated treatment well    Behavior During Therapy [facility] for tasks assessed/performed                   Past Medical History:  Diagnosis Date   Allergy    Anxiety    Asthma    INFREQUENT PROBLEM - rarely used inhaler   CAD (coronary artery disease) 06/29/2023   CCTA 06/27/23: CAC score 15.7 (45th percentile), mild nonobstructive CAD [mid LAD 1-24, proximal LCx 1-24]    Cervical spondylosis    Cervical strain 07/22/2014   Chest pain of uncertain etiology 12/13/2021   Degenerative arthritis    Dyslipidemia    Eczema    GERD (gastroesophageal reflux disease)    Guillain-Barre syndrome (HCC)    History of Guillain-Barre syndrome 07/22/2014   Hyperlipidemia    Hypertension    Hypothyroidism    Lumbosacral spondylosis    Palpitations 12/12/2021   Pure hypercholesterolemia 12/13/2021   Raynaud disease    Thyroid  nodule    Past Surgical History:  Procedure Laterality Date   ABDOMINAL HYSTERECTOMY     COLONOSCOPY     ESOPHAGOGASTRODUODENOSCOPY (EGD) WITH PROPOFOL  N/A 09/13/2021   Procedure: ESOPHAGOGASTRODUODENOSCOPY (EGD) WITH PROPOFOL ;  Surgeon: Tobin Forts, MD;  Location: [facility];  Service: Endoscopy;  Laterality: N/A;   FOOT SURGERY Left    GANGLION CYST EXCISION Left    Wrist   HERNIA REPAIR     KNEE ARTHROSCOPY     RT KNEE   KNEE ARTHROTOMY Right 08/27/2013   Procedure: RIGHT KNEE SCAR EXCISION AND FEMORAL REVISION;  Surgeon: Aurther Blue, MD;  Location: [facility];  Service: Orthopedics;  Laterality:  Right;   REPLACEMENT TOTAL KNEE Right    2014, 2021   REPLACEMENT TOTAL KNEE BILATERAL Bilateral    SHOULDER ARTHROSCOPY WITH DISTAL CLAVICLE RESECTION Left 12/18/2023   Procedure: LEFT SHOULDER ARTHROSCOPY WITH EXTENSIVE DEBRIDEMENT, DISTAL CLAVICLE RESECTION / ACROMIOPLASTY;  Surgeon: Wilhelmenia Harada, MD;  Location: [facility];  Service: Orthopedics;  Laterality: Left;   STRABISMUS SURGERY Right    TONSILLECTOMY     vaginal vulvo prolapse  09/16/2015   Patient Active Problem List   Diagnosis Date Noted   Anxiety 01/30/2024   Peripheral neuropathy 01/30/2024   Tendinitis of left rotator cuff 12/18/2023   Arthritis of left acromioclavicular joint 12/18/2023   CAD (coronary artery disease) 06/29/2023   Paresthesia 10/18/2022   Onychomycosis 01/29/2022   Chest pain of uncertain etiology 12/13/2021   Pure hypercholesterolemia 12/13/2021   Palpitations 12/12/2021   Esophageal dysphagia    Allergic rhinitis 04/04/2021   Allergic rhinitis due to animal (cat) (dog) hair and dander 04/04/2021   Allergic rhinitis due to pollen 04/04/2021   Hormone deficiency 04/04/2021   Syncope and collapse 12/27/2020   Hypotension 12/27/2020   Bradycardia 12/27/2020   History of COVID-19 12/27/2020   Preoperative evaluation to rule out surgical contraindication 07/16/2020   Arthrofibrosis of knee joint,  right 11/26/2019   Arthritis of knee 07/30/2019   Presence of right artificial knee joint 07/30/2019   Sleep apnea 10/16/2018   Shoulder impingement syndrome 01/15/2018   Gastroesophageal reflux disease 02/02/2016   Sensorineural hearing loss (SNHL), bilateral 02/02/2016   Vaginal vault prolapse after hysterectomy 07/28/2015   History of Guillain-Barre syndrome 07/22/2014   Cervical strain 07/22/2014   Postoperative stiffness of total knee replacement (HCC) 08/27/2013   Abnormality of gait 08/22/2012   Cervical spondylosis without myelopathy 08/22/2012   Muscle weakness (generalized)  08/22/2012   Guillain-Barre syndrome (HCC) 08/22/2012   Essential hypertension 08/22/2012   Strabismus 08/22/2012   Degenerative joint disease of knee, left 08/22/2012   Hypothyroidism 08/22/2012   Raynaud's disease 08/22/2012   Knee joint replacement by other means 08/22/2012    PCP: Lonzie Robins, MD  REFERRING PROVIDER: Wilhelmenia Harada, MD  REFERRING DIAG: Tendinitis of left rotator cuff [M75.82]  Arthroscopic extensive debridement - 29823 Supraspinatus Tendon, Anterior Labrum, Superior Labrum, and Posterior Labrum Arthroscopic distal clavicle excision - 30865 Arthroscopic subacromial decompression - 78469  THERAPY DIAG:  Abnormal posture  Acute pain of left shoulder  Cramp and spasm  Muscle weakness (generalized)  Rationale for Evaluation and Treatment: Rehabilitation  ONSET DATE: DOS 12/18/2023  SUBJECTIVE:                                                                                                                                                                                      SUBJECTIVE STATEMENT: Saw MD yesterday who is pleased with progress so far. Had soreness after last session from weighted abduction.   Initial evaluation:  Pt presents to PT post op L shoulder arthroscopy with extensive debridement, distal clavicle resection/ acromioplasty. She reports some mild pain in her L shoulder since surgery but thinks it is due to the incisions rubbing against the dressing. She denies any issues with sleeping. She continues to have numbness in her fingers from her recent nerve block. Prior to sx, she was unable to reach [facility], out to the side, and had difficulty dressing her bed.   Hand dominance: Right  PERTINENT HISTORY: HTN, GERD, Gullian- Barre syndrome.   PAIN:  Are you having pain? yes: NPRS scale: 2/10 Pain location:  Pain description: Aggravating factors:  Relieving factors:    PRECAUTIONS: Shoulder  RED FLAGS: None   WEIGHT BEARING  RESTRICTIONS: No  FALLS:  Has patient fallen in last 6 months? No  LIVING ENVIRONMENT: Lives with: lives with their spouse Lives in: House/apartment Stairs: Yes: Internal: 1 flight steps; on right going up and External: 4-5 steps; bilateral but cannot reach both Has following equipment at home: shower chair and  Grab bars  OCCUPATION: Retired   PLOF: Independent  PATIENT GOALS: Pt would like to be able to get back to functional mobility and get back to working out.   NEXT MD VISIT: 12/26/2023  OBJECTIVE:  Note: Objective measures were completed at Evaluation unless otherwise noted.  DIAGNOSTIC FINDINGS:  1. Mild supraspinatus tendinosis with a tiny insertional interstitial tear. 2. Moderate infraspinatus tendinosis.    PATIENT SURVEYS:  Cindia Crease Next session.  4/17: 34%  COGNITION: Overall cognitive status: Within functional limits for tasks assessed     SENSATION: WFL  POSTURE: Pt is present in sling.   UPPER EXTREMITY ROM:   Passive ROM Right eval Left eval Left  01/09/24 Lt AROM 4/10 L AROM 4/17  Shoulder flexion   AAROM supine with cane, 155 136 160  Shoulder extension     158 (deviates into scaption)  Shoulder abduction       Shoulder adduction       Shoulder internal rotation     T6  Shoulder external rotation     T2 (equal to R)  (Blank rows = not tested)  UPPER EXTREMITY MMT:  MMT Right eval Left eval R 4/17 L 4/17  Shoulder flexion   4/5 4+/5  Shoulder extension      Shoulder abduction   4+/5 4/5  Shoulder adduction      Shoulder internal rotation   5/5 5/5  Shoulder external rotation   4+/5 4+/5  Middle trapezius      Lower trapezius      Elbow flexion      Elbow extension      Wrist flexion      Wrist extension      Wrist ulnar deviation      Wrist radial deviation      Wrist pronation      Wrist supination      Grip strength (lbs)      (Blank rows = not tested)                                                                                                                                 TREATMENT Treatment                            4/24: Blank lines following charge title = not provided on this treatment date.   Manual:  TPDN No STM upper traps, scalenes There-ex: PROM L shoulder Standing shoulder flexion 2# 2x10,  Standing press out 2# 2x10 Standing active abduction 0# 2x10 Wall push up (partial) 2x10 Supine ABC 2# x1 Bicep curls 5# 2x10 Standing reaches to shelf in cabinet- 3# 2x10, 2# x10 OH press 2# 2x10 There-Act:  Self Care:  Nuro-Re-ed: Ball rolls at wall 4way 2.2# ball x15ea Theraband row GTB 2x15 Theraband extension 2x10  TREATMENT Treatment                            4/21: Blank lines following charge title = not provided on this treatment date.   Manual:  TPDN No STM upper traps, scalenes There-ex: PROM L shoulder Standing shoulder flexion 2# 2x10,  Standing press out 2# 2x10 Standing active abduction 2# 2x10 Wall push up (partial) 2x10  Nuro-Re-ed: Ball rolls at wall 4way 2.2# ball x15ea Theraband row GTB 2x15 Theraband extension 2x10 Treatment                            4/17: Blank lines following charge title = not provided on this treatment date.   Manual:  TPDN No STM upper traps, scalenes There-ex: UT stretch L Scalene stretch L Updated MMT and ROM Standing shoulder flexion 3# x10, 2# x10 Standing press out 2# 2x10 Standing active abduction 1# 2x10  There-Act:  Self Care:  Nuro-Re-ed:     Treatment                            4/14: Blank lines following charge title = not provided on this treatment date.   Manual:  TPDN No STM upper traps Cervical distraction STM subscap There-ex: Seated round back, + OH reach, + forward serve, + ER Supine overhead flexion- able to achieve 140 deg There-Act:  Self Care:  Nuro-Re-ed:  Gait  Training:   Treatment                            4/10: Blank lines following charge title = not provided on this treatment date.   Manual:  TPDN YES Trigger Point Dry Needling  Initial Treatment: Pt instructed on Dry Needling rational, procedures, and possible side effects. Pt instructed to expect mild to moderate muscle soreness later in the day and/or into the next day.  Pt instructed in methods to reduce muscle soreness. Pt instructed to continue prescribed HEP. Because Dry Needling was performed over or adjacent to a lung field, pt was educated on S/S of pneumothorax and to seek immediate medical attention should they occur.  Patient verbalized understanding of these instructions and education.   Patient Verbal Consent Given: Yes Education Handout Provided: Yes- in pt instructions Muscles Treated: Lt upper trap Electrical Stimulation Performed: No Treatment Response/Outcome: twitch with decr spasm Prone first rib depression & gross rib PA There-ex: Review of HEP Pulleys flexion & abd Dead lift with scap retraction Wall slide + liftoff Prone scap retraction+UE ext; prone Y There-Act:  Self Care:  Nuro-Re-ed:  Gait Training:    Treatment                            4/7: Blank lines following charge title = not provided on this treatment date.   Manual:  TPDN No STM subscap, lats Cervical musculature on the left- suboccipital release There-ex: Supine flexion with wand- PT blocking scapula Sidelying ER Sidelying abduction Sidelying horizontal abduction There-Act:     PATIENT EDUCATION: Education details: HEP,  and POC. Person educated: Patient Education method: Medical illustrator, handout Education comprehension: verbalized understanding and returned demonstration  HOME EXERCISE PROGRAM: Access Code: [email or web address] URL: [email or web address]   ASSESSMENT:   CLINICAL IMPRESSION:   Pt continues to demonstrate  trigger points in UT,  so spent time working on STM to this area. Able to progress with shoulder strengthening and NMC without good tolerance. She denied pain with activities, though did report muscular fatigue. Will continue to progress as tolerated.   Initial impression: Patient referred to PT for s/p L shoulder arthroscopy with extensive debridement, distal clavicle resection/ acromioplasty. Changed dressings today. Surgical site appears clean with no redness or major swelling noted. She denies any significant pain. Pt provided initial HEP. Patient will benefit from skilled PT to address below impairments, limitations and improve overall function.  OBJECTIVE IMPAIRMENTS: decreased activity tolerance, decreased shoulder mobility, decreased ROM, decreased strength, impaired flexibility, impaired UE use, postural dysfunction, and pain.  ACTIVITY LIMITATIONS: reaching, lifting, carry,  cleaning, driving, and or occupation  PERSONAL FACTORS:  also affecting patient's functional outcome.  REHAB POTENTIAL: Good  CLINICAL DECISION MAKING: Stable/uncomplicated  EVALUATION COMPLEXITY: Low    GOALS: Short term PT Goals Target date: 02/17/2024 Pt will be I and compliant with HEP. Baseline:  Goal status: MET 4/17 Pt will decrease pain by 25% overall Baseline: Goal status: MET 4/17  Long term PT goals Target date: 03/14/2024 Pt will improve Lt shoulder AROM to [facility] to improve functional reaching Baseline: Goal status: IN PROGRESS 4/17 Pt will improve  Lt shoulder strength to at least 4+/5 MMT to improve functional strength Baseline: Goal status: IN PROGRESS 4/17 Pt will improve Quick Dash by 14 points to have significant change per MCID.  Baseline: Goal status:IN PROGRESS 4/17  Pt will reduce pain to overall less than 1-2/10 with usual activity and work activity. Baseline: Goal status: IN PROGRESS 4/17      5. Pt will return to recreational activities without significant pain or limitations.   Baseline:    Goal Status: IN PROGRESS 4/17  PLAN: PT FREQUENCY: 1-2 x per week.   PT DURATION: 8-12 weeks.   PLANNED INTERVENTIONS (unless contraindicated): aquatic PT, Canalith repositioning, cryotherapy, Electrical stimulation, Iontophoresis with 4 mg/ml dexamethasome, Moist heat, traction, Ultrasound, gait training, Therapeutic exercise, balance training, neuromuscular re-education, patient/family education, prosthetic training, manual techniques, passive ROM, dry needling, taping, vasopnuematic device, vestibular, spinal manipulations, joint manipulations  PLAN FOR NEXT SESSION: review/update HEP, progress per protocol.   Herb Loges, PTA  02/07/24 2:37 PM    [facility] [address] [place], [place], [address] Phone: [phone]   Fax:  [phone]

## 2024-02-08 DIAGNOSIS — J3081 Allergic rhinitis due to animal (cat) (dog) hair and dander: Secondary | ICD-10-CM | POA: Diagnosis not present

## 2024-02-08 DIAGNOSIS — J3089 Other allergic rhinitis: Secondary | ICD-10-CM | POA: Diagnosis not present

## 2024-02-11 ENCOUNTER — Encounter (HOSPITAL_BASED_OUTPATIENT_CLINIC_OR_DEPARTMENT_OTHER)

## 2024-02-12 DIAGNOSIS — E039 Hypothyroidism, unspecified: Secondary | ICD-10-CM | POA: Diagnosis not present

## 2024-02-12 DIAGNOSIS — E049 Nontoxic goiter, unspecified: Secondary | ICD-10-CM | POA: Diagnosis not present

## 2024-02-12 DIAGNOSIS — E7800 Pure hypercholesterolemia, unspecified: Secondary | ICD-10-CM | POA: Diagnosis not present

## 2024-02-12 DIAGNOSIS — I10 Essential (primary) hypertension: Secondary | ICD-10-CM | POA: Diagnosis not present

## 2024-02-12 DIAGNOSIS — R7301 Impaired fasting glucose: Secondary | ICD-10-CM | POA: Diagnosis not present

## 2024-02-13 DIAGNOSIS — L218 Other seborrheic dermatitis: Secondary | ICD-10-CM | POA: Diagnosis not present

## 2024-02-13 DIAGNOSIS — L814 Other melanin hyperpigmentation: Secondary | ICD-10-CM | POA: Diagnosis not present

## 2024-02-13 DIAGNOSIS — L239 Allergic contact dermatitis, unspecified cause: Secondary | ICD-10-CM | POA: Diagnosis not present

## 2024-02-13 DIAGNOSIS — L570 Actinic keratosis: Secondary | ICD-10-CM | POA: Diagnosis not present

## 2024-02-13 DIAGNOSIS — L821 Other seborrheic keratosis: Secondary | ICD-10-CM | POA: Diagnosis not present

## 2024-02-14 ENCOUNTER — Ambulatory Visit (HOSPITAL_BASED_OUTPATIENT_CLINIC_OR_DEPARTMENT_OTHER): Attending: Orthopaedic Surgery

## 2024-02-14 ENCOUNTER — Encounter (HOSPITAL_BASED_OUTPATIENT_CLINIC_OR_DEPARTMENT_OTHER): Payer: Self-pay

## 2024-02-14 DIAGNOSIS — R252 Cramp and spasm: Secondary | ICD-10-CM | POA: Insufficient documentation

## 2024-02-14 DIAGNOSIS — M25512 Pain in left shoulder: Secondary | ICD-10-CM | POA: Insufficient documentation

## 2024-02-14 DIAGNOSIS — M6281 Muscle weakness (generalized): Secondary | ICD-10-CM | POA: Insufficient documentation

## 2024-02-14 DIAGNOSIS — R293 Abnormal posture: Secondary | ICD-10-CM | POA: Insufficient documentation

## 2024-02-14 NOTE — Therapy (Signed)
 OUTPATIENT PHYSICAL THERAPY SHOULDER TREATMENT      Patient Name: Brianna Gates MRN: [id number] DOB:1951/01/04, 73 y.o., female Today's Date: 02/14/2024  END OF SESSION:  PT End of Session - 02/14/24 1306     Visit Number 13    Date for PT Re-Evaluation 03/14/24    Authorization Type [employer]    Progress Note Due on Visit 20    PT Start Time 1303    PT Stop Time 1345    PT Time Calculation (min) 42 min    Activity Tolerance Patient tolerated treatment well    Behavior During Therapy [facility] for tasks assessed/performed                    Past Medical History:  Diagnosis Date   Allergy    Anxiety    Asthma    INFREQUENT PROBLEM - rarely used inhaler   CAD (coronary artery disease) 06/29/2023   CCTA 06/27/23: CAC score 15.7 (45th percentile), mild nonobstructive CAD [mid LAD 1-24, proximal LCx 1-24]    Cervical spondylosis    Cervical strain 07/22/2014   Chest pain of uncertain etiology 12/13/2021   Degenerative arthritis    Dyslipidemia    Eczema    GERD (gastroesophageal reflux disease)    Guillain-Barre syndrome (HCC)    History of Guillain-Barre syndrome 07/22/2014   Hyperlipidemia    Hypertension    Hypothyroidism    Lumbosacral spondylosis    Palpitations 12/12/2021   Pure hypercholesterolemia 12/13/2021   Raynaud disease    Thyroid  nodule    Past Surgical History:  Procedure Laterality Date   ABDOMINAL HYSTERECTOMY     COLONOSCOPY     ESOPHAGOGASTRODUODENOSCOPY (EGD) WITH PROPOFOL  N/A 09/13/2021   Procedure: ESOPHAGOGASTRODUODENOSCOPY (EGD) WITH PROPOFOL ;  Surgeon: Tobin Forts, MD;  Location: [facility];  Service: Endoscopy;  Laterality: N/A;   FOOT SURGERY Left    GANGLION CYST EXCISION Left    Wrist   HERNIA REPAIR     KNEE ARTHROSCOPY     RT KNEE   KNEE ARTHROTOMY Right 08/27/2013   Procedure: RIGHT KNEE SCAR EXCISION AND FEMORAL REVISION;  Surgeon: Aurther Blue, MD;  Location: [facility];  Service: Orthopedics;  Laterality:  Right;   REPLACEMENT TOTAL KNEE Right    2014, 2021   REPLACEMENT TOTAL KNEE BILATERAL Bilateral    SHOULDER ARTHROSCOPY WITH DISTAL CLAVICLE RESECTION Left 12/18/2023   Procedure: LEFT SHOULDER ARTHROSCOPY WITH EXTENSIVE DEBRIDEMENT, DISTAL CLAVICLE RESECTION / ACROMIOPLASTY;  Surgeon: Wilhelmenia Harada, MD;  Location: [facility];  Service: Orthopedics;  Laterality: Left;   STRABISMUS SURGERY Right    TONSILLECTOMY     vaginal vulvo prolapse  09/16/2015   Patient Active Problem List   Diagnosis Date Noted   Anxiety 01/30/2024   Peripheral neuropathy 01/30/2024   Tendinitis of left rotator cuff 12/18/2023   Arthritis of left acromioclavicular joint 12/18/2023   CAD (coronary artery disease) 06/29/2023   Paresthesia 10/18/2022   Onychomycosis 01/29/2022   Chest pain of uncertain etiology 12/13/2021   Pure hypercholesterolemia 12/13/2021   Palpitations 12/12/2021   Esophageal dysphagia    Allergic rhinitis 04/04/2021   Allergic rhinitis due to animal (cat) (dog) hair and dander 04/04/2021   Allergic rhinitis due to pollen 04/04/2021   Hormone deficiency 04/04/2021   Syncope and collapse 12/27/2020   Hypotension 12/27/2020   Bradycardia 12/27/2020   History of COVID-19 12/27/2020   Preoperative evaluation to rule out surgical contraindication 07/16/2020   Arthrofibrosis of knee  joint, right 11/26/2019   Arthritis of knee 07/30/2019   Presence of right artificial knee joint 07/30/2019   Sleep apnea 10/16/2018   Shoulder impingement syndrome 01/15/2018   Gastroesophageal reflux disease 02/02/2016   Sensorineural hearing loss (SNHL), bilateral 02/02/2016   Vaginal vault prolapse after hysterectomy 07/28/2015   History of Guillain-Barre syndrome 07/22/2014   Cervical strain 07/22/2014   Postoperative stiffness of total knee replacement (HCC) 08/27/2013   Abnormality of gait 08/22/2012   Cervical spondylosis without myelopathy 08/22/2012   Muscle weakness (generalized)  08/22/2012   Guillain-Barre syndrome (HCC) 08/22/2012   Essential hypertension 08/22/2012   Strabismus 08/22/2012   Degenerative joint disease of knee, left 08/22/2012   Hypothyroidism 08/22/2012   Raynaud's disease 08/22/2012   Knee joint replacement by other means 08/22/2012    PCP: Lonzie Robins, MD  REFERRING PROVIDER: Wilhelmenia Harada, MD  REFERRING DIAG: Tendinitis of left rotator cuff [M75.82]  Arthroscopic extensive debridement - 29823 Supraspinatus Tendon, Anterior Labrum, Superior Labrum, and Posterior Labrum Arthroscopic distal clavicle excision - 40981 Arthroscopic subacromial decompression - 19147  THERAPY DIAG:  Abnormal posture  Muscle weakness (generalized)  Cramp and spasm  Acute pain of left shoulder  Rationale for Evaluation and Treatment: Rehabilitation  ONSET DATE: DOS 12/18/2023  SUBJECTIVE:                                                                                                                                                                                      SUBJECTIVE STATEMENT: Pt reports some soreness after last session. No pain at entry.    Initial evaluation:  Pt presents to PT post op L shoulder arthroscopy with extensive debridement, distal clavicle resection/ acromioplasty. She reports some mild pain in her L shoulder since surgery but thinks it is due to the incisions rubbing against the dressing. She denies any issues with sleeping. She continues to have numbness in her fingers from her recent nerve block. Prior to sx, she was unable to reach [facility], out to the side, and had difficulty dressing her bed.   Hand dominance: Right  PERTINENT HISTORY: HTN, GERD, Gullian- Barre syndrome.   PAIN:  Are you having pain? No: NPRS scale: 0/10 Pain location:  Pain description: Aggravating factors:  Relieving factors:    PRECAUTIONS: Shoulder  RED FLAGS: None   WEIGHT BEARING RESTRICTIONS: No  FALLS:  Has patient fallen in last  6 months? No  LIVING ENVIRONMENT: Lives with: lives with their spouse Lives in: House/apartment Stairs: Yes: Internal: 1 flight steps; on right going up and External: 4-5 steps; bilateral but cannot reach both Has following equipment at home: shower chair and Grab bars  OCCUPATION: Retired  PLOF: Independent  PATIENT GOALS: Pt would like to be able to get back to functional mobility and get back to working out.   NEXT MD VISIT: 12/26/2023  OBJECTIVE:  Note: Objective measures were completed at Evaluation unless otherwise noted.  DIAGNOSTIC FINDINGS:  1. Mild supraspinatus tendinosis with a tiny insertional interstitial tear. 2. Moderate infraspinatus tendinosis.    PATIENT SURVEYS:  Cindia Crease Next session.  4/17: 34%  COGNITION: Overall cognitive status: Within functional limits for tasks assessed     SENSATION: WFL  POSTURE: Pt is present in sling.   UPPER EXTREMITY ROM:   Passive ROM Right eval Left eval Left  01/09/24 Lt AROM 4/10 L AROM 4/17  Shoulder flexion   AAROM supine with cane, 155 136 160  Shoulder extension     158 (deviates into scaption)  Shoulder abduction       Shoulder adduction       Shoulder internal rotation     T6  Shoulder external rotation     T2 (equal to R)  (Blank rows = not tested)  UPPER EXTREMITY MMT:  MMT Right eval Left eval R 4/17 L 4/17  Shoulder flexion   4/5 4+/5  Shoulder extension      Shoulder abduction   4+/5 4/5  Shoulder adduction      Shoulder internal rotation   5/5 5/5  Shoulder external rotation   4+/5 4+/5  Middle trapezius      Lower trapezius      Elbow flexion      Elbow extension      Wrist flexion      Wrist extension      Wrist ulnar deviation      Wrist radial deviation      Wrist pronation      Wrist supination      Grip strength (lbs)      (Blank rows = not tested)                                                                                                                                 TREATMENT  Treatment                            5/1: Blank lines following charge title = not provided on this treatment date.   Manual:  TPDN No STM upper traps, deltoid There-ex: PROM L shoulder Standing shoulder flexion 2# 2x10,  Standing press out 2# 2x10 Standing active abduction 0# 2x10 Supine ABC 2# x2 Standing reaches to shelf in cabinet- 3# 2x10 Posterior capsule stretch  There-Act:  Self Care:  Nuro-Re-ed: Ball rolls at wall 4way 2.2# ball x15ea Theraband row GTB 2x15 Theraband extension 2x10 5 GTB          Treatment  4/24: Blank lines following charge title = not provided on this treatment date.   Manual:  TPDN No STM upper traps, scalenes There-ex: PROM L shoulder Standing shoulder flexion 2# 2x10,  Standing press out 2# 2x10 Standing active abduction 0# 2x10 Wall push up (partial) 2x10 Supine ABC 2# x1 Bicep curls 5# 2x10 Standing reaches to shelf in cabinet- 3# 2x10, 2# x10 OH press 2# 2x10 There-Act:  Self Care:  Nuro-Re-ed: Ball rolls at wall 4way 2.2# ball x15ea Theraband row GTB 2x15 Theraband extension 2x10                                                                                                                TREATMENT Treatment                            4/21: Blank lines following charge title = not provided on this treatment date.   Manual:  TPDN No STM upper traps, scalenes There-ex: PROM L shoulder Standing shoulder flexion 2# 2x10,  Standing press out 2# 2x10 Standing active abduction 2# 2x10 Wall push up (partial) 2x10  Nuro-Re-ed: Ball rolls at wall 4way 2.2# ball x15ea Theraband row GTB 2x15 Theraband extension 2x10 Treatment                            4/17: Blank lines following charge title = not provided on this treatment date.   Manual:  TPDN No STM upper traps, scalenes There-ex: UT stretch L Scalene stretch L Updated MMT and ROM Standing shoulder flexion 3#  x10, 2# x10 Standing press out 2# 2x10 Standing active abduction 1# 2x10  There-Act:  Self Care:  Nuro-Re-ed:     Treatment                            4/14: Blank lines following charge title = not provided on this treatment date.   Manual:  TPDN No STM upper traps Cervical distraction STM subscap There-ex: Seated round back, + OH reach, + forward serve, + ER Supine overhead flexion- able to achieve 140 deg There-Act:  Self Care:  Nuro-Re-ed:  Gait Training:   Treatment                            4/10: Blank lines following charge title = not provided on this treatment date.   Manual:  TPDN YES Trigger Point Dry Needling  Initial Treatment: Pt instructed on Dry Needling rational, procedures, and possible side effects. Pt instructed to expect mild to moderate muscle soreness later in the day and/or into the next day.  Pt instructed in methods to reduce muscle soreness. Pt instructed to continue prescribed HEP. Because Dry Needling was performed over or adjacent to a lung field, pt was educated on S/S of pneumothorax and to seek immediate medical attention should they occur.  Patient  verbalized understanding of these instructions and education.   Patient Verbal Consent Given: Yes Education Handout Provided: Yes- in pt instructions Muscles Treated: Lt upper trap Electrical Stimulation Performed: No Treatment Response/Outcome: twitch with decr spasm Prone first rib depression & gross rib PA There-ex: Review of HEP Pulleys flexion & abd Dead lift with scap retraction Wall slide + liftoff Prone scap retraction+UE ext; prone Y There-Act:  Self Care:  Nuro-Re-ed:  Gait Training:    Treatment                            4/7: Blank lines following charge title = not provided on this treatment date.   Manual:  TPDN No STM subscap, lats Cervical musculature on the left- suboccipital release There-ex: Supine flexion with wand- PT blocking  scapula Sidelying ER Sidelying abduction Sidelying horizontal abduction There-Act:     PATIENT EDUCATION: Education details: HEP,  and POC. Person educated: Patient Education method: Medical illustrator, handout Education comprehension: verbalized understanding and returned demonstration  HOME EXERCISE PROGRAM: Access Code: [email or web address] URL: [email or web address]   ASSESSMENT:   CLINICAL IMPRESSION:   Improved triggerpoints palpated in L upper trap. Mild tightness into passive flexion today. Some pain in deltoid with active flexion with 2# which felt better after STM to this area. Improving with OH strength, but does still demonstrate mild compensation. She will benefit from continued strengthening.   Initial impression: Patient referred to PT for s/p L shoulder arthroscopy with extensive debridement, distal clavicle resection/ acromioplasty. Changed dressings today. Surgical site appears clean with no redness or major swelling noted. She denies any significant pain. Pt provided initial HEP. Patient will benefit from skilled PT to address below impairments, limitations and improve overall function.  OBJECTIVE IMPAIRMENTS: decreased activity tolerance, decreased shoulder mobility, decreased ROM, decreased strength, impaired flexibility, impaired UE use, postural dysfunction, and pain.  ACTIVITY LIMITATIONS: reaching, lifting, carry,  cleaning, driving, and or occupation  PERSONAL FACTORS:  also affecting patient's functional outcome.  REHAB POTENTIAL: Good  CLINICAL DECISION MAKING: Stable/uncomplicated  EVALUATION COMPLEXITY: Low    GOALS: Short term PT Goals Target date: 02/17/2024 Pt will be I and compliant with HEP. Baseline:  Goal status: MET 4/17 Pt will decrease pain by 25% overall Baseline: Goal status: MET 4/17  Long term PT goals Target date: 03/14/2024 Pt will improve Lt shoulder AROM to [facility] to improve functional  reaching Baseline: Goal status: IN PROGRESS 4/17 Pt will improve  Lt shoulder strength to at least 4+/5 MMT to improve functional strength Baseline: Goal status: IN PROGRESS 4/17 Pt will improve Quick Dash by 14 points to have significant change per MCID.  Baseline: Goal status:IN PROGRESS 4/17  Pt will reduce pain to overall less than 1-2/10 with usual activity and work activity. Baseline: Goal status: IN PROGRESS 4/17      5. Pt will return to recreational activities without significant pain or limitations.   Baseline:   Goal Status: IN PROGRESS 4/17  PLAN: PT FREQUENCY: 1-2 x per week.   PT DURATION: 8-12 weeks.   PLANNED INTERVENTIONS (unless contraindicated): aquatic PT, Canalith repositioning, cryotherapy, Electrical stimulation, Iontophoresis with 4 mg/ml dexamethasome, Moist heat, traction, Ultrasound, gait training, Therapeutic exercise, balance training, neuromuscular re-education, patient/family education, prosthetic training, manual techniques, passive ROM, dry needling, taping, vasopnuematic device, vestibular, spinal manipulations, joint manipulations  PLAN FOR NEXT SESSION: review/update HEP, progress per protocol.   Herb Loges, PTA  02/14/24 2:21 PM    [place]  [facility] [address] [place], [place], [address] Phone: [phone]   Fax:  [phone]

## 2024-02-19 ENCOUNTER — Encounter (HOSPITAL_BASED_OUTPATIENT_CLINIC_OR_DEPARTMENT_OTHER): Payer: Self-pay

## 2024-02-19 ENCOUNTER — Ambulatory Visit (HOSPITAL_BASED_OUTPATIENT_CLINIC_OR_DEPARTMENT_OTHER)

## 2024-02-19 DIAGNOSIS — M25512 Pain in left shoulder: Secondary | ICD-10-CM

## 2024-02-19 DIAGNOSIS — J3081 Allergic rhinitis due to animal (cat) (dog) hair and dander: Secondary | ICD-10-CM | POA: Diagnosis not present

## 2024-02-19 DIAGNOSIS — R252 Cramp and spasm: Secondary | ICD-10-CM

## 2024-02-19 DIAGNOSIS — J3089 Other allergic rhinitis: Secondary | ICD-10-CM | POA: Diagnosis not present

## 2024-02-19 DIAGNOSIS — R293 Abnormal posture: Secondary | ICD-10-CM | POA: Diagnosis not present

## 2024-02-19 DIAGNOSIS — M6281 Muscle weakness (generalized): Secondary | ICD-10-CM | POA: Diagnosis not present

## 2024-02-19 NOTE — Therapy (Signed)
 OUTPATIENT PHYSICAL THERAPY SHOULDER TREATMENT      Patient Name: Brianna Gates MRN: [id number] DOB:06-Jun-1951, 73 y.o., female Today's Date: 02/19/2024  END OF SESSION:  PT End of Session - 02/19/24 1410     Visit Number 14    Date for PT Re-Evaluation 03/14/24    Authorization Type [employer]    Progress Note Due on Visit 20    PT Start Time 1347    PT Stop Time 1430    PT Time Calculation (min) 43 min    Activity Tolerance Patient tolerated treatment well    Behavior During Therapy [facility] for tasks assessed/performed                     Past Medical History:  Diagnosis Date   Allergy    Anxiety    Asthma    INFREQUENT PROBLEM - rarely used inhaler   CAD (coronary artery disease) 06/29/2023   CCTA 06/27/23: CAC score 15.7 (45th percentile), mild nonobstructive CAD [mid LAD 1-24, proximal LCx 1-24]    Cervical spondylosis    Cervical strain 07/22/2014   Chest pain of uncertain etiology 12/13/2021   Degenerative arthritis    Dyslipidemia    Eczema    GERD (gastroesophageal reflux disease)    Guillain-Barre syndrome (HCC)    History of Guillain-Barre syndrome 07/22/2014   Hyperlipidemia    Hypertension    Hypothyroidism    Lumbosacral spondylosis    Palpitations 12/12/2021   Pure hypercholesterolemia 12/13/2021   Raynaud disease    Thyroid  nodule    Past Surgical History:  Procedure Laterality Date   ABDOMINAL HYSTERECTOMY     COLONOSCOPY     ESOPHAGOGASTRODUODENOSCOPY (EGD) WITH PROPOFOL  N/A 09/13/2021   Procedure: ESOPHAGOGASTRODUODENOSCOPY (EGD) WITH PROPOFOL ;  Surgeon: Tobin Forts, MD;  Location: [facility];  Service: Endoscopy;  Laterality: N/A;   FOOT SURGERY Left    GANGLION CYST EXCISION Left    Wrist   HERNIA REPAIR     KNEE ARTHROSCOPY     RT KNEE   KNEE ARTHROTOMY Right 08/27/2013   Procedure: RIGHT KNEE SCAR EXCISION AND FEMORAL REVISION;  Surgeon: Aurther Blue, MD;  Location: [facility];  Service: Orthopedics;   Laterality: Right;   REPLACEMENT TOTAL KNEE Right    2014, 2021   REPLACEMENT TOTAL KNEE BILATERAL Bilateral    SHOULDER ARTHROSCOPY WITH DISTAL CLAVICLE RESECTION Left 12/18/2023   Procedure: LEFT SHOULDER ARTHROSCOPY WITH EXTENSIVE DEBRIDEMENT, DISTAL CLAVICLE RESECTION / ACROMIOPLASTY;  Surgeon: Wilhelmenia Harada, MD;  Location: [facility];  Service: Orthopedics;  Laterality: Left;   STRABISMUS SURGERY Right    TONSILLECTOMY     vaginal vulvo prolapse  09/16/2015   Patient Active Problem List   Diagnosis Date Noted   Anxiety 01/30/2024   Peripheral neuropathy 01/30/2024   Tendinitis of left rotator cuff 12/18/2023   Arthritis of left acromioclavicular joint 12/18/2023   CAD (coronary artery disease) 06/29/2023   Paresthesia 10/18/2022   Onychomycosis 01/29/2022   Chest pain of uncertain etiology 12/13/2021   Pure hypercholesterolemia 12/13/2021   Palpitations 12/12/2021   Esophageal dysphagia    Allergic rhinitis 04/04/2021   Allergic rhinitis due to animal (cat) (dog) hair and dander 04/04/2021   Allergic rhinitis due to pollen 04/04/2021   Hormone deficiency 04/04/2021   Syncope and collapse 12/27/2020   Hypotension 12/27/2020   Bradycardia 12/27/2020   History of COVID-19 12/27/2020   Preoperative evaluation to rule out surgical contraindication 07/16/2020   Arthrofibrosis of  knee joint, right 11/26/2019   Arthritis of knee 07/30/2019   Presence of right artificial knee joint 07/30/2019   Sleep apnea 10/16/2018   Shoulder impingement syndrome 01/15/2018   Gastroesophageal reflux disease 02/02/2016   Sensorineural hearing loss (SNHL), bilateral 02/02/2016   Vaginal vault prolapse after hysterectomy 07/28/2015   History of Guillain-Barre syndrome 07/22/2014   Cervical strain 07/22/2014   Postoperative stiffness of total knee replacement (HCC) 08/27/2013   Abnormality of gait 08/22/2012   Cervical spondylosis without myelopathy 08/22/2012   Muscle weakness  (generalized) 08/22/2012   Guillain-Barre syndrome (HCC) 08/22/2012   Essential hypertension 08/22/2012   Strabismus 08/22/2012   Degenerative joint disease of knee, left 08/22/2012   Hypothyroidism 08/22/2012   Raynaud's disease 08/22/2012   Knee joint replacement by other means 08/22/2012    PCP: Lonzie Robins, MD  REFERRING PROVIDER: Wilhelmenia Harada, MD  REFERRING DIAG: Tendinitis of left rotator cuff [M75.82]  Arthroscopic extensive debridement - 29823 Supraspinatus Tendon, Anterior Labrum, Superior Labrum, and Posterior Labrum Arthroscopic distal clavicle excision - 16109 Arthroscopic subacromial decompression - 60454  THERAPY DIAG:  Abnormal posture  Cramp and spasm  Muscle weakness (generalized)  Acute pain of left shoulder  Rationale for Evaluation and Treatment: Rehabilitation  ONSET DATE: DOS 12/18/2023  SUBJECTIVE:                                                                                                                                                                                      SUBJECTIVE STATEMENT: Pt reports some soreness after last session. No pain at entry.    Initial evaluation:  Pt presents to PT post op L shoulder arthroscopy with extensive debridement, distal clavicle resection/ acromioplasty. She reports some mild pain in her L shoulder since surgery but thinks it is due to the incisions rubbing against the dressing. She denies any issues with sleeping. She continues to have numbness in her fingers from her recent nerve block. Prior to sx, she was unable to reach [facility], out to the side, and had difficulty dressing her bed.   Hand dominance: Right  PERTINENT HISTORY: HTN, GERD, Gullian- Barre syndrome.   PAIN:  Are you having pain? No: NPRS scale: 0/10 Pain location:  Pain description: Aggravating factors:  Relieving factors:    PRECAUTIONS: Shoulder  RED FLAGS: None   WEIGHT BEARING RESTRICTIONS: No  FALLS:  Has patient  fallen in last 6 months? No  LIVING ENVIRONMENT: Lives with: lives with their spouse Lives in: House/apartment Stairs: Yes: Internal: 1 flight steps; on right going up and External: 4-5 steps; bilateral but cannot reach both Has following equipment at home: shower chair and Grab bars  OCCUPATION:  Retired   PLOF: Independent  PATIENT GOALS: Pt would like to be able to get back to functional mobility and get back to working out.   NEXT MD VISIT: 12/26/2023  OBJECTIVE:  Note: Objective measures were completed at Evaluation unless otherwise noted.  DIAGNOSTIC FINDINGS:  1. Mild supraspinatus tendinosis with a tiny insertional interstitial tear. 2. Moderate infraspinatus tendinosis.    PATIENT SURVEYS:  Cindia Crease Next session.  4/17: 34%  COGNITION: Overall cognitive status: Within functional limits for tasks assessed     SENSATION: WFL  POSTURE: Pt is present in sling.   UPPER EXTREMITY ROM:   Passive ROM Right eval Left eval Left  01/09/24 Lt AROM 4/10 L AROM 4/17  Shoulder flexion   AAROM supine with cane, 155 136 160  Shoulder extension     158 (deviates into scaption)  Shoulder abduction       Shoulder adduction       Shoulder internal rotation     T6  Shoulder external rotation     T2 (equal to R)  (Blank rows = not tested)  UPPER EXTREMITY MMT:  MMT Right eval Left eval R 4/17 L 4/17  Shoulder flexion   4/5 4+/5  Shoulder extension      Shoulder abduction   4+/5 4/5  Shoulder adduction      Shoulder internal rotation   5/5 5/5  Shoulder external rotation   4+/5 4+/5  Middle trapezius      Lower trapezius      Elbow flexion      Elbow extension      Wrist flexion      Wrist extension      Wrist ulnar deviation      Wrist radial deviation      Wrist pronation      Wrist supination      Grip strength (lbs)      (Blank rows = not tested)                                                                                                                                 TREATMENT   Treatment                            5/6: Blank lines following charge title = not provided on this treatment date.   Manual:  TPDN No STM upper traps, deltoid There-ex: PROM L shoulder Standing shoulder flexion 2# 2x10,  Standing press out 2# 2x10 Standing active abduction 0# 2x10 Supine ABC 2# x2 Standing reaches to shelf in cabinet- 3# 2x10 Posterior capsule stretch  There-Act:  Self Care:  Nuro-Re-ed: Ball rolls at wall 4way 2.2# ball x15ea Theraband row GTB 2x15 Theraband extension 2x15 GTB          Treatment  5/1: Blank lines following charge title = not provided on this treatment date.   Manual:  TPDN No STM upper traps, deltoid There-ex: PROM L shoulder Standing shoulder flexion 2# 2x10,  Standing press out 2# 2x10 Standing active abduction 0# 2x10 Supine ABC 2# x2 Standing reaches to shelf in cabinet- 3# 2x10 Posterior capsule stretch  There-Act:  Self Care:  Nuro-Re-ed: Ball rolls at wall 4way 2.2# ball x15ea Theraband row GTB 2x15 Theraband extension 2x10 5 GTB          Treatment                            4/24: Blank lines following charge title = not provided on this treatment date.   Manual:  TPDN No STM upper traps, scalenes There-ex: PROM L shoulder Standing shoulder flexion 2# 2x10,  Standing press out 2# 2x10 Standing active abduction 0# 2x10 Wall push up (partial) 2x10 Supine ABC 2# x1 Bicep curls 5# 2x10 Standing reaches to shelf in cabinet- 3# 2x10, 2# x10 OH press 2# 2x10 There-Act:  Self Care:  Nuro-Re-ed: Ball rolls at wall 4way 2.2# ball x15ea Theraband row GTB 2x15 Theraband extension 2x10                                                                                                                TREATMENT Treatment                            4/21: Blank lines following charge title = not provided on this treatment date.   Manual:  TPDN No STM  upper traps, scalenes There-ex: PROM L shoulder Standing shoulder flexion 2# 2x10,  Standing press out 2# 2x10 Standing active abduction 2# 2x10 Wall push up (partial) 2x10  Nuro-Re-ed: Ball rolls at wall 4way 2.2# ball x15ea Theraband row GTB 2x15 Theraband extension 2x10 Treatment                            4/17: Blank lines following charge title = not provided on this treatment date.   Manual:  TPDN No STM upper traps, scalenes There-ex: UT stretch L Scalene stretch L Updated MMT and ROM Standing shoulder flexion 3# x10, 2# x10 Standing press out 2# 2x10 Standing active abduction 1# 2x10  There-Act:  Self Care:  Nuro-Re-ed:     Treatment                            4/14: Blank lines following charge title = not provided on this treatment date.   Manual:  TPDN No STM upper traps Cervical distraction STM subscap There-ex: Seated round back, + OH reach, + forward serve, + ER Supine overhead flexion- able to achieve 140 deg There-Act:  Self Care:  Nuro-Re-ed:  Gait Training:   Treatment  4/10: Blank lines following charge title = not provided on this treatment date.   Manual:  TPDN YES Trigger Point Dry Needling  Initial Treatment: Pt instructed on Dry Needling rational, procedures, and possible side effects. Pt instructed to expect mild to moderate muscle soreness later in the day and/or into the next day.  Pt instructed in methods to reduce muscle soreness. Pt instructed to continue prescribed HEP. Because Dry Needling was performed over or adjacent to a lung field, pt was educated on S/S of pneumothorax and to seek immediate medical attention should they occur.  Patient verbalized understanding of these instructions and education.   Patient Verbal Consent Given: Yes Education Handout Provided: Yes- in pt instructions Muscles Treated: Lt upper trap Electrical Stimulation Performed: No Treatment Response/Outcome: twitch  with decr spasm Prone first rib depression & gross rib PA There-ex: Review of HEP Pulleys flexion & abd Dead lift with scap retraction Wall slide + liftoff Prone scap retraction+UE ext; prone Y There-Act:  Self Care:  Nuro-Re-ed:  Gait Training:    Treatment                            4/7: Blank lines following charge title = not provided on this treatment date.   Manual:  TPDN No STM subscap, lats Cervical musculature on the left- suboccipital release There-ex: Supine flexion with wand- PT blocking scapula Sidelying ER Sidelying abduction Sidelying horizontal abduction There-Act:     PATIENT EDUCATION: Education details: HEP,  and POC. Person educated: Patient Education method: Medical illustrator, handout Education comprehension: verbalized understanding and returned demonstration  HOME EXERCISE PROGRAM: Access Code: [email or web address] URL: [email or web address]   ASSESSMENT:   CLINICAL IMPRESSION:   Decreased weight with standing active shoulder abduction due to discomfort.  This did improve without resistance.  No complaints with 2 pound resisted standing active shoulder flexion.  Continued to work on strengthening as tolerated.  Initial impression: Patient referred to PT for s/p L shoulder arthroscopy with extensive debridement, distal clavicle resection/ acromioplasty. Changed dressings today. Surgical site appears clean with no redness or major swelling noted. She denies any significant pain. Pt provided initial HEP. Patient will benefit from skilled PT to address below impairments, limitations and improve overall function.  OBJECTIVE IMPAIRMENTS: decreased activity tolerance, decreased shoulder mobility, decreased ROM, decreased strength, impaired flexibility, impaired UE use, postural dysfunction, and pain.  ACTIVITY LIMITATIONS: reaching, lifting, carry,  cleaning, driving, and or occupation  PERSONAL FACTORS:  also affecting  patient's functional outcome.  REHAB POTENTIAL: Good  CLINICAL DECISION MAKING: Stable/uncomplicated  EVALUATION COMPLEXITY: Low    GOALS: Short term PT Goals Target date: 02/17/2024 Pt will be I and compliant with HEP. Baseline:  Goal status: MET 4/17 Pt will decrease pain by 25% overall Baseline: Goal status: MET 4/17  Long term PT goals Target date: 03/14/2024 Pt will improve Lt shoulder AROM to [facility] to improve functional reaching Baseline: Goal status: IN PROGRESS 4/17 Pt will improve  Lt shoulder strength to at least 4+/5 MMT to improve functional strength Baseline: Goal status: IN PROGRESS 4/17 Pt will improve Quick Dash by 14 points to have significant change per MCID.  Baseline: Goal status:IN PROGRESS 4/17  Pt will reduce pain to overall less than 1-2/10 with usual activity and work activity. Baseline: Goal status: IN PROGRESS 4/17      5. Pt will return to recreational activities without significant pain or limitations.   Baseline:   Goal Status: IN  PROGRESS 4/17  PLAN: PT FREQUENCY: 1-2 x per week.   PT DURATION: 8-12 weeks.   PLANNED INTERVENTIONS (unless contraindicated): aquatic PT, Canalith repositioning, cryotherapy, Electrical stimulation, Iontophoresis with 4 mg/ml dexamethasome, Moist heat, traction, Ultrasound, gait training, Therapeutic exercise, balance training, neuromuscular re-education, patient/family education, prosthetic training, manual techniques, passive ROM, dry needling, taping, vasopnuematic device, vestibular, spinal manipulations, joint manipulations  PLAN FOR NEXT SESSION: review/update HEP, progress per protocol.   Herb Loges, PTA  02/19/24 5:05 PM    [facility] [address] [place], [place], [address] Phone: [phone]   Fax:  [phone]

## 2024-02-20 DIAGNOSIS — F411 Generalized anxiety disorder: Secondary | ICD-10-CM | POA: Diagnosis not present

## 2024-03-11 DIAGNOSIS — J3089 Other allergic rhinitis: Secondary | ICD-10-CM | POA: Diagnosis not present

## 2024-03-12 DIAGNOSIS — F411 Generalized anxiety disorder: Secondary | ICD-10-CM | POA: Diagnosis not present

## 2024-03-13 ENCOUNTER — Ambulatory Visit (HOSPITAL_BASED_OUTPATIENT_CLINIC_OR_DEPARTMENT_OTHER): Payer: Self-pay

## 2024-03-13 ENCOUNTER — Encounter (HOSPITAL_BASED_OUTPATIENT_CLINIC_OR_DEPARTMENT_OTHER): Payer: Self-pay

## 2024-03-13 DIAGNOSIS — M25512 Pain in left shoulder: Secondary | ICD-10-CM | POA: Diagnosis not present

## 2024-03-13 DIAGNOSIS — R293 Abnormal posture: Secondary | ICD-10-CM

## 2024-03-13 DIAGNOSIS — M6281 Muscle weakness (generalized): Secondary | ICD-10-CM | POA: Diagnosis not present

## 2024-03-13 DIAGNOSIS — R252 Cramp and spasm: Secondary | ICD-10-CM

## 2024-03-13 NOTE — Addendum Note (Signed)
 Addended by: Hassel Lins on: 03/13/2024 04:50 PM   Modules accepted: Orders

## 2024-03-13 NOTE — Therapy (Addendum)
 OUTPATIENT PHYSICAL THERAPY SHOULDER TREATMENT Progress Note Reporting Period 12/22/2023 to 03/14/2024  See note below for Objective Data and Assessment of Progress/Goals.         Patient Name: Brianna Gates MRN: [id number] DOB:Aug 15, 1951, 73 y.o., female Today's Date: 03/13/2024  END OF SESSION:  PT End of Session - 03/13/24 1434     Visit Number 15    Date for PT Re-Evaluation 04/12/24    Authorization Type [employer]    Progress Note Due on Visit 20    PT Start Time 1349    PT Stop Time 1432    PT Time Calculation (min) 43 min    Activity Tolerance Patient tolerated treatment well    Behavior During Therapy [facility] for tasks assessed/performed                      Past Medical History:  Diagnosis Date   Allergy    Anxiety    Asthma    INFREQUENT PROBLEM - rarely used inhaler   CAD (coronary artery disease) 06/29/2023   CCTA 06/27/23: CAC score 15.7 (45th percentile), mild nonobstructive CAD [mid LAD 1-24, proximal LCx 1-24]    Cervical spondylosis    Cervical strain 07/22/2014   Chest pain of uncertain etiology 12/13/2021   Degenerative arthritis    Dyslipidemia    Eczema    GERD (gastroesophageal reflux disease)    Guillain-Barre syndrome (HCC)    History of Guillain-Barre syndrome 07/22/2014   Hyperlipidemia    Hypertension    Hypothyroidism    Lumbosacral spondylosis    Palpitations 12/12/2021   Pure hypercholesterolemia 12/13/2021   Raynaud disease    Thyroid  nodule    Past Surgical History:  Procedure Laterality Date   ABDOMINAL HYSTERECTOMY     COLONOSCOPY     ESOPHAGOGASTRODUODENOSCOPY (EGD) WITH PROPOFOL  N/A 09/13/2021   Procedure: ESOPHAGOGASTRODUODENOSCOPY (EGD) WITH PROPOFOL ;  Surgeon: Tobin Forts, MD;  Location: [facility];  Service: Endoscopy;  Laterality: N/A;   FOOT SURGERY Left    GANGLION CYST EXCISION Left    Wrist   HERNIA REPAIR     KNEE ARTHROSCOPY     RT KNEE   KNEE ARTHROTOMY Right 08/27/2013    Procedure: RIGHT KNEE SCAR EXCISION AND FEMORAL REVISION;  Surgeon: Aurther Blue, MD;  Location: [facility];  Service: Orthopedics;  Laterality: Right;   REPLACEMENT TOTAL KNEE Right    2014, 2021   REPLACEMENT TOTAL KNEE BILATERAL Bilateral    SHOULDER ARTHROSCOPY WITH DISTAL CLAVICLE RESECTION Left 12/18/2023   Procedure: LEFT SHOULDER ARTHROSCOPY WITH EXTENSIVE DEBRIDEMENT, DISTAL CLAVICLE RESECTION / ACROMIOPLASTY;  Surgeon: Wilhelmenia Harada, MD;  Location: [facility];  Service: Orthopedics;  Laterality: Left;   STRABISMUS SURGERY Right    TONSILLECTOMY     vaginal vulvo prolapse  09/16/2015   Patient Active Problem List   Diagnosis Date Noted   Anxiety 01/30/2024   Peripheral neuropathy 01/30/2024   Tendinitis of left rotator cuff 12/18/2023   Arthritis of left acromioclavicular joint 12/18/2023   CAD (coronary artery disease) 06/29/2023   Paresthesia 10/18/2022   Onychomycosis 01/29/2022   Chest pain of uncertain etiology 12/13/2021   Pure hypercholesterolemia 12/13/2021   Palpitations 12/12/2021   Esophageal dysphagia    Allergic rhinitis 04/04/2021   Allergic rhinitis due to animal (cat) (dog) hair and dander 04/04/2021   Allergic rhinitis due to pollen 04/04/2021   Hormone deficiency 04/04/2021   Syncope and collapse 12/27/2020   Hypotension 12/27/2020  Bradycardia 12/27/2020   History of COVID-19 12/27/2020   Preoperative evaluation to rule out surgical contraindication 07/16/2020   Arthrofibrosis of knee joint, right 11/26/2019   Arthritis of knee 07/30/2019   Presence of right artificial knee joint 07/30/2019   Sleep apnea 10/16/2018   Shoulder impingement syndrome 01/15/2018   Gastroesophageal reflux disease 02/02/2016   Sensorineural hearing loss (SNHL), bilateral 02/02/2016   Vaginal vault prolapse after hysterectomy 07/28/2015   History of Guillain-Barre syndrome 07/22/2014   Cervical strain 07/22/2014   Postoperative stiffness of total knee  replacement (HCC) 08/27/2013   Abnormality of gait 08/22/2012   Cervical spondylosis without myelopathy 08/22/2012   Muscle weakness (generalized) 08/22/2012   Guillain-Barre syndrome (HCC) 08/22/2012   Essential hypertension 08/22/2012   Strabismus 08/22/2012   Degenerative joint disease of knee, left 08/22/2012   Hypothyroidism 08/22/2012   Raynaud's disease 08/22/2012   Knee joint replacement by other means 08/22/2012    PCP: Lonzie Robins, MD  REFERRING PROVIDER: Wilhelmenia Harada, MD  REFERRING DIAG: Tendinitis of left rotator cuff [M75.82]  Arthroscopic extensive debridement - 29823 Supraspinatus Tendon, Anterior Labrum, Superior Labrum, and Posterior Labrum Arthroscopic distal clavicle excision - 29528 Arthroscopic subacromial decompression - 41324  THERAPY DIAG:  Abnormal posture  Cramp and spasm  Muscle weakness (generalized)  Acute pain of left shoulder  Rationale for Evaluation and Treatment: Rehabilitation  ONSET DATE: DOS 12/18/2023  SUBJECTIVE:                                                                                                                                                                                      SUBJECTIVE STATEMENT: Pt returns to PT after trip to [place] for 2 weeks. Did water  aerobics this morning which bothered her shoulder a little. Also having some neck pain when holding weights in balance class.    Initial evaluation:  Pt presents to PT post op L shoulder arthroscopy with extensive debridement, distal clavicle resection/ acromioplasty. She reports some mild pain in her L shoulder since surgery but thinks it is due to the incisions rubbing against the dressing. She denies any issues with sleeping. She continues to have numbness in her fingers from her recent nerve block. Prior to sx, she was unable to reach [facility], out to the side, and had difficulty dressing her bed.   Hand dominance: Right  PERTINENT HISTORY: HTN, GERD,  Gullian- Barre syndrome.   PAIN:  Are you having pain? No: NPRS scale: 0/10 Pain location:  Pain description: Aggravating factors:  Relieving factors:    PRECAUTIONS: Shoulder  RED FLAGS: None   WEIGHT BEARING RESTRICTIONS: No  FALLS:  Has patient fallen in last 6 months?  No  LIVING ENVIRONMENT: Lives with: lives with their spouse Lives in: House/apartment Stairs: Yes: Internal: 1 flight steps; on right going up and External: 4-5 steps; bilateral but cannot reach both Has following equipment at home: shower chair and Grab bars  OCCUPATION: Retired   PLOF: Independent  PATIENT GOALS: Pt would like to be able to get back to functional mobility and get back to working out.   NEXT MD VISIT: 12/26/2023  OBJECTIVE:  Note: Objective measures were completed at Evaluation unless otherwise noted.  DIAGNOSTIC FINDINGS:  1. Mild supraspinatus tendinosis with a tiny insertional interstitial tear. 2. Moderate infraspinatus tendinosis.    PATIENT SURVEYS:  Cindia Crease Next session.  4/17: 34% 5/29:32%  COGNITION: Overall cognitive status: Within functional limits for tasks assessed     SENSATION: WFL  POSTURE: Pt is present in sling.   UPPER EXTREMITY ROM:   Passive ROM Right eval Left eval Left  01/09/24 Lt AROM 4/10 L AROM 4/17 L AROM 5/29   Shoulder flexion   AAROM supine with cane, 155 136 160 161  Shoulder extension     158 (deviates into scaption) 158  Shoulder abduction        Shoulder adduction        Shoulder internal rotation     T6 T6  Shoulder external rotation     T2 (equal to R) T2  (Blank rows = not tested)  UPPER EXTREMITY MMT:  MMT Right eval Left eval R 4/17 L 4/17 L 5/29  Shoulder flexion   4/5 4+/5 4+/5 (some deltoid pain)  Shoulder extension       Shoulder abduction   4+/5 4/5 4/5  Shoulder adduction       Shoulder internal rotation   5/5 5/5 5/5  Shoulder external rotation   4+/5 4+/5 4+/5  Middle trapezius       Lower  trapezius       Elbow flexion       Elbow extension       Wrist flexion       Wrist extension       Wrist ulnar deviation       Wrist radial deviation       Wrist pronation       Wrist supination       Grip strength (lbs)       (Blank rows = not tested)                                                                                                                                TREATMENT   Treatment                            5/29: Blank lines following charge title = not provided on this treatment date.  TPDN No STM upper traps, deltoid, lats/teres  PROM L shoulder Standing shoulder flexion 2# 2x10,  Standing  press out 2# 2x10 Standing active abduction 1# 2x10 Updated ROM, MMT, goals Quick dash   Treatment                            5/6: Blank lines following charge title = not provided on this treatment date.   Manual:  TPDN No STM upper traps, deltoid There-ex: PROM L shoulder Standing shoulder flexion 2# 2x10,  Standing press out 2# 2x10 Standing active abduction 0# 2x10 Supine ABC 2# x2 Standing reaches to shelf in cabinet- 3# 2x10 Posterior capsule stretch  There-Act:  Self Care:  Nuro-Re-ed: Ball rolls at wall 4way 2.2# ball x15ea Theraband row GTB 2x15 Theraband extension 2x15 GTB          Treatment                            5/1: Blank lines following charge title = not provided on this treatment date.   Manual:  TPDN No STM upper traps, deltoid There-ex: PROM L shoulder Standing shoulder flexion 2# 2x10,  Standing press out 2# 2x10 Standing active abduction 0# 2x10 Supine ABC 2# x2 Standing reaches to shelf in cabinet- 3# 2x10 Posterior capsule stretch  There-Act:  Self Care:  Nuro-Re-ed: Ball rolls at wall 4way 2.2# ball x15ea Theraband row GTB 2x15 Theraband extension 2x10 5 GTB          Treatment                            4/24: Blank lines following charge title = not provided on this treatment date.   Manual:  TPDN  No STM upper traps, scalenes There-ex: PROM L shoulder Standing shoulder flexion 2# 2x10,  Standing press out 2# 2x10 Standing active abduction 0# 2x10 Wall push up (partial) 2x10 Supine ABC 2# x1 Bicep curls 5# 2x10 Standing reaches to shelf in cabinet- 3# 2x10, 2# x10 OH press 2# 2x10 There-Act:  Self Care:  Nuro-Re-ed: Ball rolls at wall 4way 2.2# ball x15ea Theraband row GTB 2x15 Theraband extension 2x10                                                                                                                   PATIENT EDUCATION: Education details: HEP,  and POC. Person educated: Patient Education method: Medical illustrator, handout Education comprehension: verbalized understanding and returned demonstration  HOME EXERCISE PROGRAM: Access Code: [email or web address] URL: [email or web address]   ASSESSMENT:   CLINICAL IMPRESSION:   Pt has attended 15 visits thus far and is making steady progress. Has met 2/2 STG and working towards all LTG. No decrease in ROM or strength despite missing last 2 weeks of PT. She does demonstrate an increase in tightness in axilla area today. Spent time on STM to address this and instructed pt in self STM for this. Pt will benefit from additional PT to continue working on  increasing ROM and strength to return to functional activity. Will extend POC accordingly.  Initial impression: Patient referred to PT for s/p L shoulder arthroscopy with extensive debridement, distal clavicle resection/ acromioplasty. Changed dressings today. Surgical site appears clean with no redness or major swelling noted. She denies any significant pain. Pt provided initial HEP. Patient will benefit from skilled PT to address below impairments, limitations and improve overall function.  OBJECTIVE IMPAIRMENTS: decreased activity tolerance, decreased shoulder mobility, decreased ROM, decreased strength, impaired flexibility, impaired UE use, postural  dysfunction, and pain.  ACTIVITY LIMITATIONS: reaching, lifting, carry,  cleaning, driving, and or occupation  PERSONAL FACTORS:  also affecting patient's functional outcome.  REHAB POTENTIAL: Good  CLINICAL DECISION MAKING: Stable/uncomplicated  EVALUATION COMPLEXITY: Low    GOALS: Short term PT Goals Target date: 02/17/2024 Pt will be I and compliant with HEP. Baseline:  Goal status: MET 4/17 Pt will decrease pain by 25% overall Baseline: Goal status: MET 4/17  Long term PT goals Target date: 03/14/2024 Pt will improve Lt shoulder AROM to [facility] to improve functional reaching Baseline: Goal status: IN PROGRESS 5/29 Pt will improve  Lt shoulder strength to at least 4+/5 MMT to improve functional strength Baseline: Goal status: IN PROGRESS 5/29 Pt will improve Quick Dash by 14 points to have significant change per MCID.  Baseline: Goal status:IN PROGRESS 5/29 (2pt improvement)  Pt will reduce pain to overall less than 1-2/10 with usual activity and work activity. Baseline: Goal status: IN PROGRESS 5/29 (can get up to 7/10 with workout classes)      5. Pt will return to recreational activities without significant pain or limitations.   Baseline:   Goal Status: IN PROGRESS 5/29  PLAN: PT FREQUENCY: 1-2 x per week.   PT DURATION: 8-12 weeks.   PLANNED INTERVENTIONS (unless contraindicated): aquatic PT, Canalith repositioning, cryotherapy, Electrical stimulation, Iontophoresis with 4 mg/ml dexamethasome, Moist heat, traction, Ultrasound, gait training, Therapeutic exercise, balance training, neuromuscular re-education, patient/family education, prosthetic training, manual techniques, passive ROM, dry needling, taping, vasopnuematic device, vestibular, spinal manipulations, joint manipulations  PLAN FOR NEXT SESSION: review/update HEP, progress per protocol.   Herb Loges, PTA  03/13/24 4:48 PM    [facility] [address] [place], [place], [address] Phone: [phone]   Fax:  [phone]  Extending POC per measurements and reports taken by PTA today. Pt will continue to benefit from strength/stability work to decrease tightness and properly utilize available ROM.  Jessica C. Hightower PT, DPT 03/13/24 4:50 PM

## 2024-03-18 ENCOUNTER — Encounter (HOSPITAL_BASED_OUTPATIENT_CLINIC_OR_DEPARTMENT_OTHER): Payer: Self-pay | Admitting: Physical Therapy

## 2024-03-18 ENCOUNTER — Ambulatory Visit (HOSPITAL_BASED_OUTPATIENT_CLINIC_OR_DEPARTMENT_OTHER): Attending: Orthopaedic Surgery | Admitting: Physical Therapy

## 2024-03-18 DIAGNOSIS — M25512 Pain in left shoulder: Secondary | ICD-10-CM | POA: Diagnosis not present

## 2024-03-18 DIAGNOSIS — R293 Abnormal posture: Secondary | ICD-10-CM | POA: Insufficient documentation

## 2024-03-18 DIAGNOSIS — R252 Cramp and spasm: Secondary | ICD-10-CM

## 2024-03-18 DIAGNOSIS — M6281 Muscle weakness (generalized): Secondary | ICD-10-CM

## 2024-03-18 NOTE — Therapy (Signed)
 OUTPATIENT PHYSICAL THERAPY SHOULDER TREATMENT Progress Note Reporting Period 12/22/2023 to 03/14/2024  See note below for Objective Data and Assessment of Progress/Goals.         Patient Name: Brianna Gates MRN: [id number] DOB:12/14/1950, 73 y.o., female Today's Date: 03/18/2024  END OF SESSION:  PT End of Session - 03/18/24 1355     Visit Number 16    Date for PT Re-Evaluation 04/12/24    Authorization Type [employer]    Progress Note Due on Visit 20    PT Start Time 1352    PT Stop Time 1430    PT Time Calculation (min) 38 min    Activity Tolerance Patient tolerated treatment well    Behavior During Therapy WFL for tasks assessed/performed                      Past Medical History:  Diagnosis Date   Allergy    Anxiety    Asthma    INFREQUENT PROBLEM - rarely used inhaler   CAD (coronary artery disease) 06/29/2023   CCTA 06/27/23: CAC score 15.7 (45th percentile), mild nonobstructive CAD [mid LAD 1-24, proximal LCx 1-24]    Cervical spondylosis    Cervical strain 07/22/2014   Chest pain of uncertain etiology 12/13/2021   Degenerative arthritis    Dyslipidemia    Eczema    GERD (gastroesophageal reflux disease)    Guillain-Barre syndrome (HCC)    History of Guillain-Barre syndrome 07/22/2014   Hyperlipidemia    Hypertension    Hypothyroidism    Lumbosacral spondylosis    Palpitations 12/12/2021   Pure hypercholesterolemia 12/13/2021   Raynaud disease    Thyroid  nodule    Past Surgical History:  Procedure Laterality Date   ABDOMINAL HYSTERECTOMY     COLONOSCOPY     ESOPHAGOGASTRODUODENOSCOPY (EGD) WITH PROPOFOL  N/A 09/13/2021   Procedure: ESOPHAGOGASTRODUODENOSCOPY (EGD) WITH PROPOFOL ;  Surgeon: Tobin Forts, MD;  Location: [facility];  Service: Endoscopy;  Laterality: N/A;   FOOT SURGERY Left    GANGLION CYST EXCISION Left    Wrist   HERNIA REPAIR     KNEE ARTHROSCOPY     RT KNEE   KNEE ARTHROTOMY Right 08/27/2013    Procedure: RIGHT KNEE SCAR EXCISION AND FEMORAL REVISION;  Surgeon: Aurther Blue, MD;  Location: [facility];  Service: Orthopedics;  Laterality: Right;   REPLACEMENT TOTAL KNEE Right    2014, 2021   REPLACEMENT TOTAL KNEE BILATERAL Bilateral    SHOULDER ARTHROSCOPY WITH DISTAL CLAVICLE RESECTION Left 12/18/2023   Procedure: LEFT SHOULDER ARTHROSCOPY WITH EXTENSIVE DEBRIDEMENT, DISTAL CLAVICLE RESECTION / ACROMIOPLASTY;  Surgeon: Wilhelmenia Harada, MD;  Location: [facility];  Service: Orthopedics;  Laterality: Left;   STRABISMUS SURGERY Right    TONSILLECTOMY     vaginal vulvo prolapse  09/16/2015   Patient Active Problem List   Diagnosis Date Noted   Anxiety 01/30/2024   Peripheral neuropathy 01/30/2024   Tendinitis of left rotator cuff 12/18/2023   Arthritis of left acromioclavicular joint 12/18/2023   CAD (coronary artery disease) 06/29/2023   Paresthesia 10/18/2022   Onychomycosis 01/29/2022   Chest pain of uncertain etiology 12/13/2021   Pure hypercholesterolemia 12/13/2021   Palpitations 12/12/2021   Esophageal dysphagia    Allergic rhinitis 04/04/2021   Allergic rhinitis due to animal (cat) (dog) hair and dander 04/04/2021   Allergic rhinitis due to pollen 04/04/2021   Hormone deficiency 04/04/2021   Syncope and collapse 12/27/2020   Hypotension 12/27/2020  Bradycardia 12/27/2020   History of COVID-19 12/27/2020   Preoperative evaluation to rule out surgical contraindication 07/16/2020   Arthrofibrosis of knee joint, right 11/26/2019   Arthritis of knee 07/30/2019   Presence of right artificial knee joint 07/30/2019   Sleep apnea 10/16/2018   Shoulder impingement syndrome 01/15/2018   Gastroesophageal reflux disease 02/02/2016   Sensorineural hearing loss (SNHL), bilateral 02/02/2016   Vaginal vault prolapse after hysterectomy 07/28/2015   History of Guillain-Barre syndrome 07/22/2014   Cervical strain 07/22/2014   Postoperative stiffness of total knee  replacement (HCC) 08/27/2013   Abnormality of gait 08/22/2012   Cervical spondylosis without myelopathy 08/22/2012   Muscle weakness (generalized) 08/22/2012   Guillain-Barre syndrome (HCC) 08/22/2012   Essential hypertension 08/22/2012   Strabismus 08/22/2012   Degenerative joint disease of knee, left 08/22/2012   Hypothyroidism 08/22/2012   Raynaud's disease 08/22/2012   Knee joint replacement by other means 08/22/2012    PCP: Lonzie Robins, MD  REFERRING PROVIDER: Wilhelmenia Harada, MD  REFERRING DIAG: Tendinitis of left rotator cuff [M75.82]  Arthroscopic extensive debridement - 29823 Supraspinatus Tendon, Anterior Labrum, Superior Labrum, and Posterior Labrum Arthroscopic distal clavicle excision - 69629 Arthroscopic subacromial decompression - 52841  THERAPY DIAG:  Abnormal posture  Cramp and spasm  Muscle weakness (generalized)  Acute pain of left shoulder  Rationale for Evaluation and Treatment: Rehabilitation  ONSET DATE: DOS 12/18/2023  SUBJECTIVE:                                                                                                                                                                                      SUBJECTIVE STATEMENT: Pt reports some shoulder and upper arm pain.    Initial evaluation:  Pt presents to PT post op L shoulder arthroscopy with extensive debridement, distal clavicle resection/ acromioplasty. She reports some mild pain in her L shoulder since surgery but thinks it is due to the incisions rubbing against the dressing. She denies any issues with sleeping. She continues to have numbness in her fingers from her recent nerve block. Prior to sx, she was unable to reach [facility], out to the side, and had difficulty dressing her bed.   Hand dominance: Right  PERTINENT HISTORY: HTN, GERD, Gullian- Barre syndrome.   PAIN:  Are you having pain? No: NPRS scale: 0/10 Pain location:  Pain description: Aggravating factors:  Relieving  factors:    PRECAUTIONS: Shoulder  RED FLAGS: None   WEIGHT BEARING RESTRICTIONS: No  FALLS:  Has patient fallen in last 6 months? No  LIVING ENVIRONMENT: Lives with: lives with their spouse Lives in: House/apartment Stairs: Yes: Internal: 1 flight steps; on right going up and External:  4-5 steps; bilateral but cannot reach both Has following equipment at home: shower chair and Grab bars  OCCUPATION: Retired   PLOF: Independent  PATIENT GOALS: Pt would like to be able to get back to functional mobility and get back to working out.   NEXT MD VISIT: 12/26/2023  OBJECTIVE:  Note: Objective measures were completed at Evaluation unless otherwise noted.  DIAGNOSTIC FINDINGS:  1. Mild supraspinatus tendinosis with a tiny insertional interstitial tear. 2. Moderate infraspinatus tendinosis.    PATIENT SURVEYS:  Cindia Crease Next session.  4/17: 34% 5/29:32%  COGNITION: Overall cognitive status: Within functional limits for tasks assessed     SENSATION: WFL  POSTURE: Pt is present in sling.   UPPER EXTREMITY ROM:   Passive ROM Right eval Left eval Left  01/09/24 Lt AROM 4/10 L AROM 4/17 L AROM 5/29   Shoulder flexion   AAROM supine with cane, 155 136 160 161  Shoulder extension     158 (deviates into scaption) 158  Shoulder abduction        Shoulder adduction        Shoulder internal rotation     T6 T6  Shoulder external rotation     T2 (equal to R) T2  (Blank rows = not tested)  UPPER EXTREMITY MMT:  MMT Right eval Left eval R 4/17 L 4/17 L 5/29  Shoulder flexion   4/5 4+/5 4+/5 (some deltoid pain)  Shoulder extension       Shoulder abduction   4+/5 4/5 4/5  Shoulder adduction       Shoulder internal rotation   5/5 5/5 5/5  Shoulder external rotation   4+/5 4+/5 4+/5  Middle trapezius       Lower trapezius       Elbow flexion       Elbow extension       Wrist flexion       Wrist extension       Wrist ulnar deviation       Wrist radial  deviation       Wrist pronation       Wrist supination       Grip strength (lbs)       (Blank rows = not tested)                                                                                                                                TREATMENT 03/18/24 STM deltoid, lats/teres, triceps Supine shoulder flexion with band between hands RTB  2 x 10  Standing bilateral ER with scap retraction RTB 2 x 10 Supine horizontal abduction RTB 2 x 10 Supine shoulder PNF D2 RTB 2 x 10  Standing shoulder flexion 2# 2 x 10   Treatment                            5/29: Blank lines following charge title =  not provided on this treatment date.  TPDN No STM upper traps, deltoid, lats/teres  PROM L shoulder Standing shoulder flexion 2# 2x10,  Standing press out 2# 2x10 Standing active abduction 1# 2x10 Updated ROM, MMT, goals Quick dash   Treatment                            5/6: Blank lines following charge title = not provided on this treatment date.   Manual:  TPDN No STM upper traps, deltoid There-ex: PROM L shoulder Standing shoulder flexion 2# 2x10,  Standing press out 2# 2x10 Standing active abduction 0# 2x10 Supine ABC 2# x2 Standing reaches to shelf in cabinet- 3# 2x10 Posterior capsule stretch  There-Act:  Self Care:  Nuro-Re-ed: Ball rolls at wall 4way 2.2# ball x15ea Theraband row GTB 2x15 Theraband extension 2x15 GTB          Treatment                            5/1: Blank lines following charge title = not provided on this treatment date.   Manual:  TPDN No STM upper traps, deltoid There-ex: PROM L shoulder Standing shoulder flexion 2# 2x10,  Standing press out 2# 2x10 Standing active abduction 0# 2x10 Supine ABC 2# x2 Standing reaches to shelf in cabinet- 3# 2x10 Posterior capsule stretch  There-Act:  Self Care:  Nuro-Re-ed: Ball rolls at wall 4way 2.2# ball x15ea Theraband row GTB 2x15 Theraband extension 2x10 5 GTB          Treatment                             4/24: Blank lines following charge title = not provided on this treatment date.   Manual:  TPDN No STM upper traps, scalenes There-ex: PROM L shoulder Standing shoulder flexion 2# 2x10,  Standing press out 2# 2x10 Standing active abduction 0# 2x10 Wall push up (partial) 2x10 Supine ABC 2# x1 Bicep curls 5# 2x10 Standing reaches to shelf in cabinet- 3# 2x10, 2# x10 OH press 2# 2x10 There-Act:  Self Care:  Nuro-Re-ed: Ball rolls at wall 4way 2.2# ball x15ea Theraband row GTB 2x15 Theraband extension 2x10                                                                                                                   PATIENT EDUCATION: Education details: HEP,  and POC. Person educated: Patient Education method: Medical illustrator, handout Education comprehension: verbalized understanding and returned demonstration  HOME EXERCISE PROGRAM: Access Code: [email or web address] URL: [email or web address]   ASSESSMENT:   CLINICAL IMPRESSION:   Patient with tenderness in tricep, teres minor, lat, subscap and STM not tolerated well. Continued with RC and periscap strengthening. Patient will continue to benefit from physical therapy in order to improve function and reduce impairment.   Initial impression: Patient referred to PT for  s/p L shoulder arthroscopy with extensive debridement, distal clavicle resection/ acromioplasty. Changed dressings today. Surgical site appears clean with no redness or major swelling noted. She denies any significant pain. Pt provided initial HEP. Patient will benefit from skilled PT to address below impairments, limitations and improve overall function.  OBJECTIVE IMPAIRMENTS: decreased activity tolerance, decreased shoulder mobility, decreased ROM, decreased strength, impaired flexibility, impaired UE use, postural dysfunction, and pain.  ACTIVITY LIMITATIONS: reaching, lifting, carry,  cleaning, driving, and or  occupation  PERSONAL FACTORS:  also affecting patient's functional outcome.  REHAB POTENTIAL: Good  CLINICAL DECISION MAKING: Stable/uncomplicated  EVALUATION COMPLEXITY: Low    GOALS: Short term PT Goals Target date: 02/17/2024 Pt will be I and compliant with HEP. Baseline:  Goal status: MET 4/17 Pt will decrease pain by 25% overall Baseline: Goal status: MET 4/17  Long term PT goals Target date: 03/14/2024 Pt will improve Lt shoulder AROM to [facility] to improve functional reaching Baseline: Goal status: IN PROGRESS 5/29 Pt will improve  Lt shoulder strength to at least 4+/5 MMT to improve functional strength Baseline: Goal status: IN PROGRESS 5/29 Pt will improve Quick Dash by 14 points to have significant change per MCID.  Baseline: Goal status:IN PROGRESS 5/29 (2pt improvement)  Pt will reduce pain to overall less than 1-2/10 with usual activity and work activity. Baseline: Goal status: IN PROGRESS 5/29 (can get up to 7/10 with workout classes)      5. Pt will return to recreational activities without significant pain or limitations.   Baseline:   Goal Status: IN PROGRESS 5/29  PLAN: PT FREQUENCY: 1-2 x per week.   PT DURATION: 8-12 weeks.   PLANNED INTERVENTIONS (unless contraindicated): aquatic PT, Canalith repositioning, cryotherapy, Electrical stimulation, Iontophoresis with 4 mg/ml dexamethasome, Moist heat, traction, Ultrasound, gait training, Therapeutic exercise, balance training, neuromuscular re-education, patient/family education, prosthetic training, manual techniques, passive ROM, dry needling, taping, vasopnuematic device, vestibular, spinal manipulations, joint manipulations  PLAN FOR NEXT SESSION: review/update HEP, progress per protocol.    Beather Liming, PT, DPT 03/18/2024, 2:30 PM     [facility] [address] [place], [place], [address] Phone: [phone]   Fax:  [phone]

## 2024-03-19 ENCOUNTER — Ambulatory Visit (HOSPITAL_BASED_OUTPATIENT_CLINIC_OR_DEPARTMENT_OTHER): Admitting: Orthopaedic Surgery

## 2024-03-19 DIAGNOSIS — M25512 Pain in left shoulder: Secondary | ICD-10-CM | POA: Diagnosis not present

## 2024-03-19 DIAGNOSIS — M542 Cervicalgia: Secondary | ICD-10-CM

## 2024-03-19 MED ORDER — LIDOCAINE HCL 1 % IJ SOLN
4.0000 mL | INTRAMUSCULAR | Status: AC | PRN
  Administered 2024-03-19: 4 mL

## 2024-03-19 MED ORDER — TRIAMCINOLONE ACETONIDE 40 MG/ML IJ SUSP
80.0000 mg | INTRAMUSCULAR | Status: AC | PRN
  Administered 2024-03-19: 80 mg via INTRA_ARTICULAR

## 2024-03-19 NOTE — Progress Notes (Signed)
 Chief Complaint: Left shoulder acromioplasty with distal clavicectomy 12/18/23     History of Present Illness:   03/19/2024: Presents 6 weeks status post the above procedure.  Overall she is doing well.  She is still having some symptoms about the posterior scapula consistent with dyskinesis and abnormal motion.  She is having some bursal clicking and pain about the posterior facet of the scapula today  PMH/PSH/Family History/Social History/Meds/Allergies:    Past Medical History:  Diagnosis Date  . Allergy   . Anxiety   . Asthma    INFREQUENT PROBLEM - rarely used inhaler  . CAD (coronary artery disease) 06/29/2023   CCTA 06/27/23: CAC score 15.7 (45th percentile), mild nonobstructive CAD [mid LAD 1-24, proximal LCx 1-24]   . Cervical spondylosis   . Cervical strain 07/22/2014  . Chest pain of uncertain etiology 12/13/2021  . Degenerative arthritis   . Dyslipidemia   . Eczema   . GERD (gastroesophageal reflux disease)   . Guillain-Barre syndrome (HCC)   . History of Guillain-Barre syndrome 07/22/2014  . Hyperlipidemia   . Hypertension   . Hypothyroidism   . Lumbosacral spondylosis   . Palpitations 12/12/2021  . Pure hypercholesterolemia 12/13/2021  . Raynaud disease   . Thyroid  nodule    Past Surgical History:  Procedure Laterality Date  . ABDOMINAL HYSTERECTOMY    . COLONOSCOPY    . ESOPHAGOGASTRODUODENOSCOPY (EGD) WITH PROPOFOL  N/A 09/13/2021   Procedure: ESOPHAGOGASTRODUODENOSCOPY (EGD) WITH PROPOFOL ;  Surgeon: Tobin Forts, MD;  Location: [facility];  Service: Endoscopy;  Laterality: N/A;  . FOOT SURGERY Left   . GANGLION CYST EXCISION Left    Wrist  . HERNIA REPAIR    . KNEE ARTHROSCOPY     RT KNEE  . KNEE ARTHROTOMY Right 08/27/2013   Procedure: RIGHT KNEE SCAR EXCISION AND FEMORAL REVISION;  Surgeon: Aurther Blue, MD;  Location: [facility];  Service: Orthopedics;  Laterality: Right;  . REPLACEMENT TOTAL KNEE Right    2014, 2021  . REPLACEMENT TOTAL KNEE  BILATERAL Bilateral   . SHOULDER ARTHROSCOPY WITH DISTAL CLAVICLE RESECTION Left 12/18/2023   Procedure: LEFT SHOULDER ARTHROSCOPY WITH EXTENSIVE DEBRIDEMENT, DISTAL CLAVICLE RESECTION / ACROMIOPLASTY;  Surgeon: Wilhelmenia Harada, MD;  Location: [facility];  Service: Orthopedics;  Laterality: Left;  . STRABISMUS SURGERY Right   . TONSILLECTOMY    . vaginal vulvo prolapse  09/16/2015   Social History   Socioeconomic History  . Marital status: Married    Spouse name: Not on file  . Number of children: 2  . Years of education: college  . Highest education level: Not on file  Occupational History    Employer: UNEMPLOYED  Tobacco Use  . Smoking status: Never  . Smokeless tobacco: Never  Vaping Use  . Vaping status: Never Used  Substance and Sexual Activity  . Alcohol  use: Not Currently    Comment: stopped drinking  . Drug use: No  . Sexual activity: Yes    Birth control/protection: Other-see comments, Post-menopausal    Comment: Hysterectomy  Other Topics Concern  . Not on file  Social History Narrative   Lives at home, married   Patient is right handed.   Patient drinks 2-3 cups caffeine daily.   Social Drivers of Corporate investment banker Strain: Not on file  Food Insecurity: Not on file  Transportation Needs: Not on file  Physical Activity: Not on file  Stress: Not on file  Social Connections: Not on file   Family  History  Problem Relation Age of Onset  . Hypertension Mother   . Other Mother        Dyslipidemia  . Stroke Mother   . Heart attack Father        59s  . Heart disease Father   . Hemochromatosis Father   . Hemochromatosis Brother   . Hyperlipidemia Brother   . Hypertension Brother   . Other Brother        Dyslipidemia  . Stomach cancer Paternal Uncle   . Colon cancer Neg Hx   . Rectal cancer Neg Hx   . Esophageal cancer Neg Hx    Allergies  Allergen Reactions  . Captopril Other (See Comments)    reaction  . Oxaprozin Hives and  Other (See Comments)  . Ramipril Cough and Other (See Comments)  . Zoloft [Sertraline Hcl] Diarrhea  . Ace Inhibitors     Other reaction(s): Other (See Comments) GB syndrome  . Doxycycline Other (See Comments)    Burning sensation/Rash  . Influenza Vac Split Quad     Other Reaction(s): Guillain-Barr syndrome  . Influenza Vaccines     HX   . Sulfa Antibiotics Other (See Comments)    unknown   Current Outpatient Medications  Medication Sig Dispense Refill  . acetaminophen  (TYLENOL ) 650 MG CR tablet Take 1,300 mg by mouth every 8 (eight) hours as needed for pain or fever.    . albuterol  (PROVENTIL  HFA;VENTOLIN  HFA) 108 (90 BASE) MCG/ACT inhaler Inhale 2 puffs into the lungs every 6 (six) hours as needed for wheezing or shortness of breath.    . amLODipine  (NORVASC ) 2.5 MG tablet Take 1 tablet (2.5 mg total) by mouth every evening. 90 tablet 3  . amoxicillin (AMOXIL) 500 MG capsule Take 2,000 mg by mouth once as needed (prior to dental appointments).    . Ascorbic Acid (VITAMIN C PO) Take 1 tablet by mouth as needed (sick symptoms).    . aspirin  EC 325 MG tablet Take 1 tablet (325 mg total) by mouth daily. 14 tablet 0  . aspirin  EC 81 MG tablet Take 81 mg by mouth daily. Swallow whole.    Brianna Gates azelastine (ASTELIN) 0.1 % nasal spray Place 1 spray into both nostrils 2 (two) times daily as needed (cold symptoms).    . busPIRone (BUSPAR) 30 MG tablet Take 30 mg by mouth 2 (two) times daily.    . Cholecalciferol (VITAMIN D ) 2000 UNITS CAPS Take 2,000 Units by mouth daily in the afternoon.    Brianna Gates COVID-19 mRNA vaccine 2023-2024 (COMIRNATY ) SUSP injection Inject into the muscle. 0.3 mL 0  . COVID-19 mRNA vaccine, Pfizer, (COMIRNATY ) syringe Inject into the muscle. 0.3 mL 0  . DULoxetine  (CYMBALTA ) 30 MG capsule Take 1 capsule (30 mg total) by mouth daily. 30 capsule 11  . Emollient (CETAPHIL) cream Apply 1 application topically daily.    . EPINEPHrine  0.3 mg/0.3 mL IJ SOAJ injection Inject 0.3 mg into  the muscle once as needed for anaphylaxis.    . estradiol  (ESTRACE ) 0.1 MG/GM vaginal cream Place 1 Applicatorful vaginally 2 (two) times a week.    . fexofenadine-pseudoephedrine (ALLEGRA-D 24) 180-240 MG 24 hr tablet Take 1 tablet by mouth daily.    . gabapentin  (NEURONTIN ) 400 MG capsule Take 1 capsule (400 mg total) by mouth 3 (three) times daily. 90 capsule 11  . ibuprofen (ADVIL,MOTRIN) 100 MG tablet Take 400 mg by mouth daily at 12 noon. May take in the pm additionally as needed    .  ketoconazole  (NIZORAL ) 2 % cream Apply 1 application topically daily. (Patient taking differently: Apply 1 application  topically as needed.) 60 g 0  . levothyroxine  (SYNTHROID ) 50 MCG tablet Take 50 mcg by mouth daily before breakfast.     . losartan  (COZAAR ) 50 MG tablet Take 1 tablet (50 mg total) by mouth in the morning. 90 tablet 3  . montelukast  (SINGULAIR ) 10 MG tablet Take 10 mg by mouth at bedtime.    . oxyCODONE  (ROXICODONE ) 5 MG immediate release tablet Take 1 tablet (5 mg total) by mouth every 4 (four) hours as needed for severe pain (pain score 7-10) or breakthrough pain. 30 tablet 0  . pantoprazole  (PROTONIX ) 40 MG tablet Take 1 tablet (40 mg total) by mouth 2 (two) times daily. Office visit for further refills (Patient taking differently: Take 40 mg by mouth as needed. Office visit for further refills) 180 tablet 0  . rosuvastatin  (CRESTOR ) 10 MG tablet Take 1 tablet (10 mg total) by mouth every other day. 90 tablet 3  . Simethicone (PHAZYME PO) Take 250 mg by mouth daily as needed (Gas).    . triamcinolone  cream (KENALOG ) 0.1 % 1 application as needed (itching bumps on stomach). Mixed with Cetaphil    . VEVYE 0.1 % SOLN Apply 1 drop to eye 2 (two) times daily.     No current facility-administered medications for this visit.   No results found.  Review of Systems:   A ROS was performed including pertinent positives and negatives as documented in the HPI.  Physical Exam :   Constitutional:  NAD and appears stated age Neurological: Alert and oriented Psych: Appropriate affect and cooperative There were no vitals taken for this visit.   Comprehensive Musculoskeletal Exam:    Left shoulder incisions are well-appearing without erythema or drainage.  Active forward elevation is 160 compared to 160 on the contralateral side.  External rotation is to 30 degrees compared to 50 on the contralateral side.  Internal rotation deferred today   Imaging:    I personally reviewed and interpreted the radiographs.   Assessment and Plan:   73 y.o. female status post acromioplasty and arthroscopic debridement.  She is having some scapular dyskinesis today as well as cervical type pain which does appear to be radicular down the left arm.  At this time I have recommended an MRI of her cervical spine and we will plan to proceed with a left shoulder ultrasound-guided posterior scapular bursal injection today after verbal consent was obtained   - Left shoulder injection provided after verbal consent obtained    Procedure Note  Patient: Brianna Gates             Date of Birth: [birth year]           MRN: [id number]             Visit Date: 03/19/2024  Procedures: Visit Diagnoses:  1. Cervicalgia     Large Joint Inj: L subacromial bursa on 03/19/2024 5:18 PM Indications: pain Details: 22 G 1.5 in needle, ultrasound-guided anterior approach  Arthrogram: No  Medications: 4 mL lidocaine  1 %; 80 mg triamcinolone  acetonide 40 MG/ML Outcome: tolerated well, no immediate complications Procedure, treatment alternatives, risks and benefits explained, specific risks discussed. Consent was given by the patient. Immediately prior to procedure a time out was called to verify the correct patient, procedure, equipment, support staff and site/side marked as required. Patient was prepped and draped in the usual sterile fashion.  I personally saw and evaluated the patient, and participated in  the management and treatment plan.  Wilhelmenia Harada, MD Attending Physician, Orthopedic Surgery  This document was dictated using Dragon voice recognition software. A reasonable attempt at proof reading has been made to minimize errors.

## 2024-03-20 DIAGNOSIS — J3089 Other allergic rhinitis: Secondary | ICD-10-CM | POA: Diagnosis not present

## 2024-03-20 DIAGNOSIS — H25811 Combined forms of age-related cataract, right eye: Secondary | ICD-10-CM | POA: Diagnosis not present

## 2024-03-20 DIAGNOSIS — H43813 Vitreous degeneration, bilateral: Secondary | ICD-10-CM | POA: Diagnosis not present

## 2024-03-20 DIAGNOSIS — H16223 Keratoconjunctivitis sicca, not specified as Sjogren's, bilateral: Secondary | ICD-10-CM | POA: Diagnosis not present

## 2024-03-20 DIAGNOSIS — H0102B Squamous blepharitis left eye, upper and lower eyelids: Secondary | ICD-10-CM | POA: Diagnosis not present

## 2024-03-21 ENCOUNTER — Encounter (HOSPITAL_BASED_OUTPATIENT_CLINIC_OR_DEPARTMENT_OTHER): Payer: Self-pay

## 2024-03-21 ENCOUNTER — Ambulatory Visit (HOSPITAL_BASED_OUTPATIENT_CLINIC_OR_DEPARTMENT_OTHER)

## 2024-03-21 DIAGNOSIS — M6281 Muscle weakness (generalized): Secondary | ICD-10-CM

## 2024-03-21 DIAGNOSIS — R252 Cramp and spasm: Secondary | ICD-10-CM | POA: Diagnosis not present

## 2024-03-21 DIAGNOSIS — M25512 Pain in left shoulder: Secondary | ICD-10-CM | POA: Diagnosis not present

## 2024-03-21 DIAGNOSIS — R293 Abnormal posture: Secondary | ICD-10-CM

## 2024-03-21 NOTE — Therapy (Signed)
 OUTPATIENT PHYSICAL THERAPY SHOULDER TREATMENT        Patient Name: Brianna Gates MRN: [id number] DOB:[birth year], 73 y.o., female Today's Date: 03/21/2024  END OF SESSION:  PT End of Session - 03/21/24 1359     Visit Number 17    Date for PT Re-Evaluation 04/12/24    Authorization Type [employer]    Progress Note Due on Visit 20    PT Start Time 1352    PT Stop Time 1430    PT Time Calculation (min) 38 min    Activity Tolerance Patient tolerated treatment well    Behavior During Therapy WFL for tasks assessed/performed                       Past Medical History:  Diagnosis Date   Allergy    Anxiety    Asthma    INFREQUENT PROBLEM - rarely used inhaler   CAD (coronary artery disease) 06/29/2023   CCTA 06/27/23: CAC score 15.7 (45th percentile), mild nonobstructive CAD [mid LAD 1-24, proximal LCx 1-24]    Cervical spondylosis    Cervical strain 07/22/2014   Chest pain of uncertain etiology 12/13/2021   Degenerative arthritis    Dyslipidemia    Eczema    GERD (gastroesophageal reflux disease)    Guillain-Barre syndrome (HCC)    History of Guillain-Barre syndrome 07/22/2014   Hyperlipidemia    Hypertension    Hypothyroidism    Lumbosacral spondylosis    Palpitations 12/12/2021   Pure hypercholesterolemia 12/13/2021   Raynaud disease    Thyroid  nodule    Past Surgical History:  Procedure Laterality Date   ABDOMINAL HYSTERECTOMY     COLONOSCOPY     ESOPHAGOGASTRODUODENOSCOPY (EGD) WITH PROPOFOL  N/A 09/13/2021   Procedure: ESOPHAGOGASTRODUODENOSCOPY (EGD) WITH PROPOFOL ;  Surgeon: Tobin Forts, MD;  Location: [facility];  Service: Endoscopy;  Laterality: N/A;   FOOT SURGERY Left    GANGLION CYST EXCISION Left    Wrist   HERNIA REPAIR     KNEE ARTHROSCOPY     RT KNEE   KNEE ARTHROTOMY Right 08/27/2013   Procedure: RIGHT KNEE SCAR EXCISION AND FEMORAL REVISION;  Surgeon: Aurther Blue, MD;  Location: [facility];  Service: Orthopedics;   Laterality: Right;   REPLACEMENT TOTAL KNEE Right    2014, 2021   REPLACEMENT TOTAL KNEE BILATERAL Bilateral    SHOULDER ARTHROSCOPY WITH DISTAL CLAVICLE RESECTION Left 12/18/2023   Procedure: LEFT SHOULDER ARTHROSCOPY WITH EXTENSIVE DEBRIDEMENT, DISTAL CLAVICLE RESECTION / ACROMIOPLASTY;  Surgeon: Wilhelmenia Harada, MD;  Location: [facility];  Service: Orthopedics;  Laterality: Left;   STRABISMUS SURGERY Right    TONSILLECTOMY     vaginal vulvo prolapse  09/16/2015   Patient Active Problem List   Diagnosis Date Noted   Anxiety 01/30/2024   Peripheral neuropathy 01/30/2024   Tendinitis of left rotator cuff 12/18/2023   Arthritis of left acromioclavicular joint 12/18/2023   CAD (coronary artery disease) 06/29/2023   Paresthesia 10/18/2022   Onychomycosis 01/29/2022   Chest pain of uncertain etiology 12/13/2021   Pure hypercholesterolemia 12/13/2021   Palpitations 12/12/2021   Esophageal dysphagia    Allergic rhinitis 04/04/2021   Allergic rhinitis due to animal (cat) (dog) hair and dander 04/04/2021   Allergic rhinitis due to pollen 04/04/2021   Hormone deficiency 04/04/2021   Syncope and collapse 12/27/2020   Hypotension 12/27/2020   Bradycardia 12/27/2020   History of COVID-19 12/27/2020   Preoperative evaluation to rule out surgical contraindication 07/16/2020  Arthrofibrosis of knee joint, right 11/26/2019   Arthritis of knee 07/30/2019   Presence of right artificial knee joint 07/30/2019   Sleep apnea 10/16/2018   Shoulder impingement syndrome 01/15/2018   Gastroesophageal reflux disease 02/02/2016   Sensorineural hearing loss (SNHL), bilateral 02/02/2016   Vaginal vault prolapse after hysterectomy 07/28/2015   History of Guillain-Barre syndrome 07/22/2014   Cervical strain 07/22/2014   Postoperative stiffness of total knee replacement (HCC) 08/27/2013   Abnormality of gait 08/22/2012   Cervical spondylosis without myelopathy 08/22/2012   Muscle weakness  (generalized) 08/22/2012   Guillain-Barre syndrome (HCC) 08/22/2012   Essential hypertension 08/22/2012   Strabismus 08/22/2012   Degenerative joint disease of knee, left 08/22/2012   Hypothyroidism 08/22/2012   Raynaud's disease 08/22/2012   Knee joint replacement by other means 08/22/2012    PCP: Lonzie Robins, MD  REFERRING PROVIDER: Wilhelmenia Harada, MD  REFERRING DIAG: Tendinitis of left rotator cuff [M75.82]  Arthroscopic extensive debridement - 29823 Supraspinatus Tendon, Anterior Labrum, Superior Labrum, and Posterior Labrum Arthroscopic distal clavicle excision - 16109 Arthroscopic subacromial decompression - 60454  THERAPY DIAG:  Abnormal posture  Cramp and spasm  Muscle weakness (generalized)  Acute pain of left shoulder  Rationale for Evaluation and Treatment: Rehabilitation  ONSET DATE: DOS 12/18/2023  SUBJECTIVE:                                                                                                                                                                                      SUBJECTIVE STATEMENT: Had injection on Wednesday into L shoulder. Reports she is unable to tell big improvement yet. MD recommended MRI of cervical spine due to radiating pain.    Initial evaluation:  Pt presents to PT post op L shoulder arthroscopy with extensive debridement, distal clavicle resection/ acromioplasty. She reports some mild pain in her L shoulder since surgery but thinks it is due to the incisions rubbing against the dressing. She denies any issues with sleeping. She continues to have numbness in her fingers from her recent nerve block. Prior to sx, she was unable to reach [facility], out to the side, and had difficulty dressing her bed.   Hand dominance: Right  PERTINENT HISTORY: HTN, GERD, Gullian- Barre syndrome.   PAIN:  Are you having pain? No: NPRS scale: 0/10 Pain location:  Pain description: Aggravating factors:  Relieving factors:     PRECAUTIONS: Shoulder  RED FLAGS: None   WEIGHT BEARING RESTRICTIONS: No  FALLS:  Has patient fallen in last 6 months? No  LIVING ENVIRONMENT: Lives with: lives with their spouse Lives in: House/apartment Stairs: Yes: Internal: 1 flight steps; on right going up and External: 4-5 steps;  bilateral but cannot reach both Has following equipment at home: shower chair and Grab bars  OCCUPATION: Retired   PLOF: Independent  PATIENT GOALS: Pt would like to be able to get back to functional mobility and get back to working out.   NEXT MD VISIT: 12/26/2023  OBJECTIVE:  Note: Objective measures were completed at Evaluation unless otherwise noted.  DIAGNOSTIC FINDINGS:  1. Mild supraspinatus tendinosis with a tiny insertional interstitial tear. 2. Moderate infraspinatus tendinosis.    PATIENT SURVEYS:  Cindia Crease Next session.  4/17: 34% 5/29:32%  COGNITION: Overall cognitive status: Within functional limits for tasks assessed     SENSATION: WFL  POSTURE: Pt is present in sling.   UPPER EXTREMITY ROM:   Passive ROM Right eval Left eval Left  01/09/24 Lt AROM 4/10 L AROM 4/17 L AROM 5/29   Shoulder flexion   AAROM supine with cane, 155 136 160 161  Shoulder extension     158 (deviates into scaption) 158  Shoulder abduction        Shoulder adduction        Shoulder internal rotation     T6 T6  Shoulder external rotation     T2 (equal to R) T2  (Blank rows = not tested)  UPPER EXTREMITY MMT:  MMT Right eval Left eval R 4/17 L 4/17 L 5/29  Shoulder flexion   4/5 4+/5 4+/5 (some deltoid pain)  Shoulder extension       Shoulder abduction   4+/5 4/5 4/5  Shoulder adduction       Shoulder internal rotation   5/5 5/5 5/5  Shoulder external rotation   4+/5 4+/5 4+/5  Middle trapezius       Lower trapezius       Elbow flexion       Elbow extension       Wrist flexion       Wrist extension       Wrist ulnar deviation       Wrist radial deviation        Wrist pronation       Wrist supination       Grip strength (lbs)       (Blank rows = not tested)                                                                                                                                TREATMENT  03/21/24  STM pec, teres, lats Scapular mobilizations Sidelying short arc abduction with scap control Sidelying shoulder abduction with scap control Prone H abduction with scap control 0# 2x10 Prone shoulder extension 2# 2x10 Prone row 4# 2x10 Resisted H abd YTB 2x10 (tried flexion with this, but pt reported increased UT discomfort) Bil ER YTB 2x10  03/18/24 STM deltoid, lats/teres, triceps Supine shoulder flexion with band between hands RTB  2 x 10  Standing bilateral ER with scap retraction RTB 2 x 10 Supine horizontal  abduction RTB 2 x 10 Supine shoulder PNF D2 RTB 2 x 10  Standing shoulder flexion 2# 2 x 10   Treatment                            5/29: Blank lines following charge title = not provided on this treatment date.  TPDN No STM upper traps, deltoid, lats/teres  PROM L shoulder Standing shoulder flexion 2# 2x10,  Standing press out 2# 2x10 Standing active abduction 1# 2x10 Updated ROM, MMT, goals Quick dash   Treatment                            5/6: Blank lines following charge title = not provided on this treatment date.   Manual:  TPDN No STM upper traps, deltoid There-ex: PROM L shoulder Standing shoulder flexion 2# 2x10,  Standing press out 2# 2x10 Standing active abduction 0# 2x10 Supine ABC 2# x2 Standing reaches to shelf in cabinet- 3# 2x10 Posterior capsule stretch  There-Act:  Self Care:  Nuro-Re-ed: Ball rolls at wall 4way 2.2# ball x15ea Theraband row GTB 2x15 Theraband extension 2x15 GTB          Treatment                            5/1: Blank lines following charge title = not provided on this treatment date.   Manual:  TPDN No STM upper traps, deltoid There-ex: PROM L shoulder Standing  shoulder flexion 2# 2x10,  Standing press out 2# 2x10 Standing active abduction 0# 2x10 Supine ABC 2# x2 Standing reaches to shelf in cabinet- 3# 2x10 Posterior capsule stretch  There-Act:  Self Care:  Nuro-Re-ed: Ball rolls at wall 4way 2.2# ball x15ea Theraband row GTB 2x15 Theraband extension 2x10 5 GTB          Treatment                            4/24: Blank lines following charge title = not provided on this treatment date.   Manual:  TPDN No STM upper traps, scalenes There-ex: PROM L shoulder Standing shoulder flexion 2# 2x10,  Standing press out 2# 2x10 Standing active abduction 0# 2x10 Wall push up (partial) 2x10 Supine ABC 2# x1 Bicep curls 5# 2x10 Standing reaches to shelf in cabinet- 3# 2x10, 2# x10 OH press 2# 2x10 There-Act:  Self Care:  Nuro-Re-ed: Ball rolls at wall 4way 2.2# ball x15ea Theraband row GTB 2x15 Theraband extension 2x10                                                                                                                   PATIENT EDUCATION: Education details: HEP,  and POC. Person educated: Patient Education method: Medical illustrator, handout Education comprehension: verbalized understanding and returned demonstration  HOME EXERCISE PROGRAM: Access  Code: [email or web address] URL: [email or web address]   ASSESSMENT:   CLINICAL IMPRESSION:   Worked on STM to axilla with improved tolerance compared to previous session. Worked on scapular mobility and stabilization with movement. She was able to maintain proper scapular position with cuing. She does have automatic tendency to hike UT. No c/o pain with exercises today. Will continue to progress as tolerated. Awaiting cervical MRI.    Initial impression: Patient referred to PT for s/p L shoulder arthroscopy with extensive debridement, distal clavicle resection/ acromioplasty. Changed dressings today. Surgical site appears clean with no redness or major  swelling noted. She denies any significant pain. Pt provided initial HEP. Patient will benefit from skilled PT to address below impairments, limitations and improve overall function.  OBJECTIVE IMPAIRMENTS: decreased activity tolerance, decreased shoulder mobility, decreased ROM, decreased strength, impaired flexibility, impaired UE use, postural dysfunction, and pain.  ACTIVITY LIMITATIONS: reaching, lifting, carry,  cleaning, driving, and or occupation  PERSONAL FACTORS:  also affecting patient's functional outcome.  REHAB POTENTIAL: Good  CLINICAL DECISION MAKING: Stable/uncomplicated  EVALUATION COMPLEXITY: Low    GOALS: Short term PT Goals Target date: 02/17/2024 Pt will be I and compliant with HEP. Baseline:  Goal status: MET 4/17 Pt will decrease pain by 25% overall Baseline: Goal status: MET 4/17  Long term PT goals Target date: 03/14/2024 Pt will improve Lt shoulder AROM to [facility] to improve functional reaching Baseline: Goal status: IN PROGRESS 5/29 Pt will improve  Lt shoulder strength to at least 4+/5 MMT to improve functional strength Baseline: Goal status: IN PROGRESS 5/29 Pt will improve Quick Dash by 14 points to have significant change per MCID.  Baseline: Goal status:IN PROGRESS 5/29 (2pt improvement)  Pt will reduce pain to overall less than 1-2/10 with usual activity and work activity. Baseline: Goal status: IN PROGRESS 5/29 (can get up to 7/10 with workout classes)      5. Pt will return to recreational activities without significant pain or limitations.   Baseline:   Goal Status: IN PROGRESS 5/29  PLAN: PT FREQUENCY: 1-2 x per week.   PT DURATION: 8-12 weeks.   PLANNED INTERVENTIONS (unless contraindicated): aquatic PT, Canalith repositioning, cryotherapy, Electrical stimulation, Iontophoresis with 4 mg/ml dexamethasome, Moist heat, traction, Ultrasound, gait training, Therapeutic exercise, balance training, neuromuscular re-education, patient/family  education, prosthetic training, manual techniques, passive ROM, dry needling, taping, vasopnuematic device, vestibular, spinal manipulations, joint manipulations  PLAN FOR NEXT SESSION: review/update HEP, progress per protocol.    Judie Noun, PTA 03/21/2024, 3:36 PM     [facility] [address] [place], [place], [address] Phone: [phone]   Fax:  [phone]

## 2024-03-24 DIAGNOSIS — J3081 Allergic rhinitis due to animal (cat) (dog) hair and dander: Secondary | ICD-10-CM | POA: Diagnosis not present

## 2024-03-24 DIAGNOSIS — J301 Allergic rhinitis due to pollen: Secondary | ICD-10-CM | POA: Diagnosis not present

## 2024-03-24 DIAGNOSIS — J3089 Other allergic rhinitis: Secondary | ICD-10-CM | POA: Diagnosis not present

## 2024-03-25 DIAGNOSIS — J0100 Acute maxillary sinusitis, unspecified: Secondary | ICD-10-CM | POA: Diagnosis not present

## 2024-03-25 DIAGNOSIS — J309 Allergic rhinitis, unspecified: Secondary | ICD-10-CM | POA: Diagnosis not present

## 2024-03-26 ENCOUNTER — Ambulatory Visit (HOSPITAL_BASED_OUTPATIENT_CLINIC_OR_DEPARTMENT_OTHER): Admitting: Physical Therapy

## 2024-03-26 ENCOUNTER — Encounter (HOSPITAL_BASED_OUTPATIENT_CLINIC_OR_DEPARTMENT_OTHER): Payer: Self-pay | Admitting: Physical Therapy

## 2024-03-26 DIAGNOSIS — M25512 Pain in left shoulder: Secondary | ICD-10-CM | POA: Diagnosis not present

## 2024-03-26 DIAGNOSIS — R293 Abnormal posture: Secondary | ICD-10-CM

## 2024-03-26 DIAGNOSIS — M6281 Muscle weakness (generalized): Secondary | ICD-10-CM | POA: Diagnosis not present

## 2024-03-26 DIAGNOSIS — R252 Cramp and spasm: Secondary | ICD-10-CM

## 2024-03-26 NOTE — Therapy (Signed)
 OUTPATIENT PHYSICAL THERAPY SHOULDER TREATMENT        Patient Name: Brianna Gates MRN: [id number] DOB:25-Mar-1951, 73 y.o., female Today's Date: 03/26/2024  END OF SESSION:  PT End of Session - 03/26/24 0853     Visit Number 18    Date for PT Re-Evaluation 04/12/24    Authorization Type [employer]    Progress Note Due on Visit 20    PT Start Time [phone]   pt arrived late   PT Stop Time 0925    PT Time Calculation (min) 33 min    Activity Tolerance Patient tolerated treatment well    Behavior During Therapy [facility] for tasks assessed/performed                        Past Medical History:  Diagnosis Date   Allergy    Anxiety    Asthma    INFREQUENT PROBLEM - rarely used inhaler   CAD (coronary artery disease) 06/29/2023   CCTA 06/27/23: CAC score 15.7 (45th percentile), mild nonobstructive CAD [mid LAD 1-24, proximal LCx 1-24]    Cervical spondylosis    Cervical strain 07/22/2014   Chest pain of uncertain etiology 12/13/2021   Degenerative arthritis    Dyslipidemia    Eczema    GERD (gastroesophageal reflux disease)    Guillain-Barre syndrome (HCC)    History of Guillain-Barre syndrome 07/22/2014   Hyperlipidemia    Hypertension    Hypothyroidism    Lumbosacral spondylosis    Palpitations 12/12/2021   Pure hypercholesterolemia 12/13/2021   Raynaud disease    Thyroid  nodule    Past Surgical History:  Procedure Laterality Date   ABDOMINAL HYSTERECTOMY     COLONOSCOPY     ESOPHAGOGASTRODUODENOSCOPY (EGD) WITH PROPOFOL  N/A 09/13/2021   Procedure: ESOPHAGOGASTRODUODENOSCOPY (EGD) WITH PROPOFOL ;  Surgeon: Tobin Forts, MD;  Location: [facility];  Service: Endoscopy;  Laterality: N/A;   FOOT SURGERY Left    GANGLION CYST EXCISION Left    Wrist   HERNIA REPAIR     KNEE ARTHROSCOPY     RT KNEE   KNEE ARTHROTOMY Right 08/27/2013   Procedure: RIGHT KNEE SCAR EXCISION AND FEMORAL REVISION;  Surgeon: Aurther Blue, MD;  Location: [facility];   Service: Orthopedics;  Laterality: Right;   REPLACEMENT TOTAL KNEE Right    2014, 2021   REPLACEMENT TOTAL KNEE BILATERAL Bilateral    SHOULDER ARTHROSCOPY WITH DISTAL CLAVICLE RESECTION Left 12/18/2023   Procedure: LEFT SHOULDER ARTHROSCOPY WITH EXTENSIVE DEBRIDEMENT, DISTAL CLAVICLE RESECTION / ACROMIOPLASTY;  Surgeon: Wilhelmenia Harada, MD;  Location: [facility];  Service: Orthopedics;  Laterality: Left;   STRABISMUS SURGERY Right    TONSILLECTOMY     vaginal vulvo prolapse  09/16/2015   Patient Active Problem List   Diagnosis Date Noted   Anxiety 01/30/2024   Peripheral neuropathy 01/30/2024   Tendinitis of left rotator cuff 12/18/2023   Arthritis of left acromioclavicular joint 12/18/2023   CAD (coronary artery disease) 06/29/2023   Paresthesia 10/18/2022   Onychomycosis 01/29/2022   Chest pain of uncertain etiology 12/13/2021   Pure hypercholesterolemia 12/13/2021   Palpitations 12/12/2021   Esophageal dysphagia    Allergic rhinitis 04/04/2021   Allergic rhinitis due to animal (cat) (dog) hair and dander 04/04/2021   Allergic rhinitis due to pollen 04/04/2021   Hormone deficiency 04/04/2021   Syncope and collapse 12/27/2020   Hypotension 12/27/2020   Bradycardia 12/27/2020   History of COVID-19 12/27/2020   Preoperative evaluation to  rule out surgical contraindication 07/16/2020   Arthrofibrosis of knee joint, right 11/26/2019   Arthritis of knee 07/30/2019   Presence of right artificial knee joint 07/30/2019   Sleep apnea 10/16/2018   Shoulder impingement syndrome 01/15/2018   Gastroesophageal reflux disease 02/02/2016   Sensorineural hearing loss (SNHL), bilateral 02/02/2016   Vaginal vault prolapse after hysterectomy 07/28/2015   History of Guillain-Barre syndrome 07/22/2014   Cervical strain 07/22/2014   Postoperative stiffness of total knee replacement (HCC) 08/27/2013   Abnormality of gait 08/22/2012   Cervical spondylosis without myelopathy  08/22/2012   Muscle weakness (generalized) 08/22/2012   Guillain-Barre syndrome (HCC) 08/22/2012   Essential hypertension 08/22/2012   Strabismus 08/22/2012   Degenerative joint disease of knee, left 08/22/2012   Hypothyroidism 08/22/2012   Raynaud's disease 08/22/2012   Knee joint replacement by other means 08/22/2012    PCP: Lonzie Robins, MD  REFERRING PROVIDER: Wilhelmenia Harada, MD  REFERRING DIAG: Tendinitis of left rotator cuff [M75.82]  Arthroscopic extensive debridement - 29823 Supraspinatus Tendon, Anterior Labrum, Superior Labrum, and Posterior Labrum Arthroscopic distal clavicle excision - 86578 Arthroscopic subacromial decompression - 46962  THERAPY DIAG:  Abnormal posture  Cramp and spasm  Muscle weakness (generalized)  Rationale for Evaluation and Treatment: Rehabilitation  ONSET DATE: DOS 12/18/2023  SUBJECTIVE:                                                                                                                                                                                      SUBJECTIVE STATEMENT: Cortisone last week but still have Lt-sided neck pain. Getting MRI on Saturday. Occasional N/T in Rt UE with use.    Initial evaluation:  Pt presents to PT post op L shoulder arthroscopy with extensive debridement, distal clavicle resection/ acromioplasty. She reports some mild pain in her L shoulder since surgery but thinks it is due to the incisions rubbing against the dressing. She denies any issues with sleeping. She continues to have numbness in her fingers from her recent nerve block. Prior to sx, she was unable to reach [facility], out to the side, and had difficulty dressing her bed.   Hand dominance: Right  PERTINENT HISTORY: HTN, GERD, Gullian- Barre syndrome.   PAIN:  Are you having pain? No: NPRS scale: 0/10 Pain location:  Pain description: Aggravating factors:  Relieving factors:    PRECAUTIONS: Shoulder  RED FLAGS: None   WEIGHT  BEARING RESTRICTIONS: No  FALLS:  Has patient fallen in last 6 months? No  LIVING ENVIRONMENT: Lives with: lives with their spouse Lives in: House/apartment Stairs: Yes: Internal: 1 flight steps; on right going up and External: 4-5 steps; bilateral but cannot reach both  Has following equipment at home: shower chair and Grab bars  OCCUPATION: Retired   PLOF: Independent  PATIENT GOALS: Pt would like to be able to get back to functional mobility and get back to working out.   NEXT MD VISIT: 12/26/2023  OBJECTIVE:  Note: Objective measures were completed at Evaluation unless otherwise noted.  DIAGNOSTIC FINDINGS:  1. Mild supraspinatus tendinosis with a tiny insertional interstitial tear. 2. Moderate infraspinatus tendinosis.    PATIENT SURVEYS:  Cindia Crease Next session.  4/17: 34% 5/29:32%  COGNITION: Overall cognitive status: Within functional limits for tasks assessed     SENSATION: WFL  POSTURE: Pt is present in sling.   UPPER EXTREMITY ROM:   Passive ROM Right eval Left eval Left  01/09/24 Lt AROM 4/10 L AROM 4/17 L AROM 5/29   Shoulder flexion   AAROM supine with cane, 155 136 160 161  Shoulder extension     158 (deviates into scaption) 158  Shoulder abduction        Shoulder adduction        Shoulder internal rotation     T6 T6  Shoulder external rotation     T2 (equal to R) T2  (Blank rows = not tested)  UPPER EXTREMITY MMT:  MMT Right eval Left eval R 4/17 L 4/17 L 5/29 Rt/Lt 6/11  Shoulder flexion   4/5 4+/5 4+/5 (some deltoid pain)   Shoulder extension      20.5/23.8  Shoulder abduction   4+/5 4/5 4/5   Shoulder adduction        Shoulder internal rotation   5/5 5/5 5/5   Shoulder external rotation   4+/5 4+/5 4+/5 14.6/13.9  Middle trapezius        Lower trapezius        Elbow flexion        Elbow extension        Wrist flexion        Wrist extension        Wrist ulnar deviation        Wrist radial deviation        Wrist  pronation        Wrist supination        Grip strength (lbs)        (Blank rows = not tested)                                                                                                                                TREATMENT  03/26/24 STM suboccipitals, cervical paraspinals, upper traps Cervical Rt to Lt mobs, distraction, distraction +rotation Supine pec stretch with breathing Supine backstroke Supine scapular mobilization   03/21/24  STM pec, teres, lats Scapular mobilizations Sidelying short arc abduction with scap control Sidelying shoulder abduction with scap control Prone H abduction with scap control 0# 2x10 Prone shoulder extension 2# 2x10 Prone row 4# 2x10 Resisted H abd YTB 2x10 (tried flexion with this,  but pt reported increased UT discomfort) Bil ER YTB 2x10  03/18/24 STM deltoid, lats/teres, triceps Supine shoulder flexion with band between hands RTB  2 x 10  Standing bilateral ER with scap retraction RTB 2 x 10 Supine horizontal abduction RTB 2 x 10 Supine shoulder PNF D2 RTB 2 x 10  Standing shoulder flexion 2# 2 x 10    PATIENT EDUCATION: Education details: HEP,  and POC. Person educated: Patient Education method: Medical illustrator, handout Education comprehension: verbalized understanding and returned demonstration  HOME EXERCISE PROGRAM: Access Code: [email or web address] URL: [email or web address]   ASSESSMENT:   CLINICAL IMPRESSION:   Able to improve scapular mobility and decrease right-sided cervical joint stiffness to decrease overuse of Lt cervical musculature. Encouraged pt to sit into ischial tuberosities rather than lumbar hyperextension that is causing periscap/cervical tightness.    Initial impression: Patient referred to PT for s/p L shoulder arthroscopy with extensive debridement, distal clavicle resection/ acromioplasty. Changed dressings today. Surgical site appears clean with no redness or major swelling noted.  She denies any significant pain. Pt provided initial HEP. Patient will benefit from skilled PT to address below impairments, limitations and improve overall function.  OBJECTIVE IMPAIRMENTS: decreased activity tolerance, decreased shoulder mobility, decreased ROM, decreased strength, impaired flexibility, impaired UE use, postural dysfunction, and pain.  ACTIVITY LIMITATIONS: reaching, lifting, carry,  cleaning, driving, and or occupation  PERSONAL FACTORS:  also affecting patient's functional outcome.  REHAB POTENTIAL: Good  CLINICAL DECISION MAKING: Stable/uncomplicated  EVALUATION COMPLEXITY: Low    GOALS: Short term PT Goals Target date: 02/17/2024 Pt will be I and compliant with HEP. Baseline:  Goal status: MET 4/17 Pt will decrease pain by 25% overall Baseline: Goal status: MET 4/17  Long term PT goals Target date: 03/14/2024 Pt will improve Lt shoulder AROM to [facility] to improve functional reaching Baseline: Goal status: IN PROGRESS 5/29 Pt will improve  Lt shoulder strength to at least 4+/5 MMT to improve functional strength Baseline: Goal status: IN PROGRESS 5/29 Pt will improve Quick Dash by 14 points to have significant change per MCID.  Baseline: Goal status:IN PROGRESS 5/29 (2pt improvement)  Pt will reduce pain to overall less than 1-2/10 with usual activity and work activity. Baseline: Goal status: IN PROGRESS 5/29 (can get up to 7/10 with workout classes)      5. Pt will return to recreational activities without significant pain or limitations.   Baseline:   Goal Status: IN PROGRESS 5/29  PLAN: PT FREQUENCY: 1-2 x per week.   PT DURATION: 8-12 weeks.   PLANNED INTERVENTIONS (unless contraindicated): aquatic PT, Canalith repositioning, cryotherapy, Electrical stimulation, Iontophoresis with 4 mg/ml dexamethasome, Moist heat, traction, Ultrasound, gait training, Therapeutic exercise, balance training, neuromuscular re-education, patient/family education,  prosthetic training, manual techniques, passive ROM, dry needling, taping, vasopnuematic device, vestibular, spinal manipulations, joint manipulations  PLAN FOR NEXT SESSION: review/update HEP, progress per protocol.    Keven Pel, PT, DPT 03/26/2024, 9:27 AM     [facility] [address] [place], [place], [address] Phone: [phone]   Fax:  [phone]

## 2024-03-28 ENCOUNTER — Ambulatory Visit (HOSPITAL_BASED_OUTPATIENT_CLINIC_OR_DEPARTMENT_OTHER)

## 2024-03-28 ENCOUNTER — Encounter (HOSPITAL_BASED_OUTPATIENT_CLINIC_OR_DEPARTMENT_OTHER): Payer: Self-pay

## 2024-03-28 DIAGNOSIS — M6281 Muscle weakness (generalized): Secondary | ICD-10-CM | POA: Diagnosis not present

## 2024-03-28 DIAGNOSIS — R293 Abnormal posture: Secondary | ICD-10-CM | POA: Diagnosis not present

## 2024-03-28 DIAGNOSIS — R252 Cramp and spasm: Secondary | ICD-10-CM | POA: Diagnosis not present

## 2024-03-28 DIAGNOSIS — M25512 Pain in left shoulder: Secondary | ICD-10-CM

## 2024-03-28 NOTE — Therapy (Signed)
 OUTPATIENT PHYSICAL THERAPY SHOULDER TREATMENT        Patient Name: Brianna Gates MRN: [id number] DOB:August 06, 1951, 73 y.o., female Today's Date: 03/28/2024  END OF SESSION:  PT End of Session - 03/28/24 1355     Visit Number 19    Date for PT Re-Evaluation 04/12/24    Authorization Type [employer] Medicare    Progress Note Due on Visit 20    PT Start Time 1352    PT Stop Time 1430    PT Time Calculation (min) 38 min    Activity Tolerance Patient tolerated treatment well    Behavior During Therapy WFL for tasks assessed/performed                      Past Medical History:  Diagnosis Date   Allergy    Anxiety    Asthma    INFREQUENT PROBLEM - rarely used inhaler   CAD (coronary artery disease) 06/29/2023   CCTA 06/27/23: CAC score 15.7 (45th percentile), mild nonobstructive CAD [mid LAD 1-24, proximal LCx 1-24]    Cervical spondylosis    Cervical strain 07/22/2014   Chest pain of uncertain etiology 12/13/2021   Degenerative arthritis    Dyslipidemia    Eczema    GERD (gastroesophageal reflux disease)    Guillain-Barre syndrome (HCC)    History of Guillain-Barre syndrome 07/22/2014   Hyperlipidemia    Hypertension    Hypothyroidism    Lumbosacral spondylosis    Palpitations 12/12/2021   Pure hypercholesterolemia 12/13/2021   Raynaud disease    Thyroid  nodule    Past Surgical History:  Procedure Laterality Date   ABDOMINAL HYSTERECTOMY     COLONOSCOPY     ESOPHAGOGASTRODUODENOSCOPY (EGD) WITH PROPOFOL  N/A 09/13/2021   Procedure: ESOPHAGOGASTRODUODENOSCOPY (EGD) WITH PROPOFOL ;  Surgeon: Tobin Forts, MD;  Location: [facility];  Service: Endoscopy;  Laterality: N/A;   FOOT SURGERY Left    GANGLION CYST EXCISION Left    Wrist   HERNIA REPAIR     KNEE ARTHROSCOPY     RT KNEE   KNEE ARTHROTOMY Right 08/27/2013   Procedure: RIGHT KNEE SCAR EXCISION AND FEMORAL REVISION;  Surgeon: Aurther Blue, MD;  Location: [facility];  Service: Orthopedics;   Laterality: Right;   REPLACEMENT TOTAL KNEE Right    2014, 2021   REPLACEMENT TOTAL KNEE BILATERAL Bilateral    SHOULDER ARTHROSCOPY WITH DISTAL CLAVICLE RESECTION Left 12/18/2023   Procedure: LEFT SHOULDER ARTHROSCOPY WITH EXTENSIVE DEBRIDEMENT, DISTAL CLAVICLE RESECTION / ACROMIOPLASTY;  Surgeon: Wilhelmenia Harada, MD;  Location: [facility];  Service: Orthopedics;  Laterality: Left;   STRABISMUS SURGERY Right    TONSILLECTOMY     vaginal vulvo prolapse  09/16/2015   Patient Active Problem List   Diagnosis Date Noted   Anxiety 01/30/2024   Peripheral neuropathy 01/30/2024   Tendinitis of left rotator cuff 12/18/2023   Arthritis of left acromioclavicular joint 12/18/2023   CAD (coronary artery disease) 06/29/2023   Paresthesia 10/18/2022   Onychomycosis 01/29/2022   Chest pain of uncertain etiology 12/13/2021   Pure hypercholesterolemia 12/13/2021   Palpitations 12/12/2021   Esophageal dysphagia    Allergic rhinitis 04/04/2021   Allergic rhinitis due to animal (cat) (dog) hair and dander 04/04/2021   Allergic rhinitis due to pollen 04/04/2021   Hormone deficiency 04/04/2021   Syncope and collapse 12/27/2020   Hypotension 12/27/2020   Bradycardia 12/27/2020   History of COVID-19 12/27/2020   Preoperative evaluation to rule out surgical contraindication 07/16/2020  Arthrofibrosis of knee joint, right 11/26/2019   Arthritis of knee 07/30/2019   Presence of right artificial knee joint 07/30/2019   Sleep apnea 10/16/2018   Shoulder impingement syndrome 01/15/2018   Gastroesophageal reflux disease 02/02/2016   Sensorineural hearing loss (SNHL), bilateral 02/02/2016   Vaginal vault prolapse after hysterectomy 07/28/2015   History of Guillain-Barre syndrome 07/22/2014   Cervical strain 07/22/2014   Postoperative stiffness of total knee replacement (HCC) 08/27/2013   Abnormality of gait 08/22/2012   Cervical spondylosis without myelopathy 08/22/2012   Muscle weakness  (generalized) 08/22/2012   Guillain-Barre syndrome (HCC) 08/22/2012   Essential hypertension 08/22/2012   Strabismus 08/22/2012   Degenerative joint disease of knee, left 08/22/2012   Hypothyroidism 08/22/2012   Raynaud's disease 08/22/2012   Knee joint replacement by other means 08/22/2012    PCP: Lonzie Robins, MD  REFERRING PROVIDER: Wilhelmenia Harada, MD  REFERRING DIAG: Tendinitis of left rotator cuff [M75.82]  Arthroscopic extensive debridement - 29823 Supraspinatus Tendon, Anterior Labrum, Superior Labrum, and Posterior Labrum Arthroscopic distal clavicle excision - 16109 Arthroscopic subacromial decompression - 60454  THERAPY DIAG:  Abnormal posture  Cramp and spasm  Muscle weakness (generalized)  Acute pain of left shoulder  Rationale for Evaluation and Treatment: Rehabilitation  ONSET DATE: DOS 12/18/2023  SUBJECTIVE:                                                                                                                                                                                      SUBJECTIVE STATEMENT: Cortisone last week but still have Lt-sided neck pain. Getting MRI on Saturday. Occasional N/T in Rt UE with use.    Initial evaluation:  Pt presents to PT post op L shoulder arthroscopy with extensive debridement, distal clavicle resection/ acromioplasty. She reports some mild pain in her L shoulder since surgery but thinks it is due to the incisions rubbing against the dressing. She denies any issues with sleeping. She continues to have numbness in her fingers from her recent nerve block. Prior to sx, she was unable to reach [facility], out to the side, and had difficulty dressing her bed.   Hand dominance: Right  PERTINENT HISTORY: HTN, GERD, Gullian- Barre syndrome.   PAIN:  Are you having pain? No: NPRS scale: 0/10 Pain location:  Pain description: Aggravating factors:  Relieving factors:    PRECAUTIONS: Shoulder  RED FLAGS: None   WEIGHT  BEARING RESTRICTIONS: No  FALLS:  Has patient fallen in last 6 months? No  LIVING ENVIRONMENT: Lives with: lives with their spouse Lives in: House/apartment Stairs: Yes: Internal: 1 flight steps; on right going up and External: 4-5 steps; bilateral but cannot reach both Has  following equipment at home: shower chair and Grab bars  OCCUPATION: Retired   PLOF: Independent  PATIENT GOALS: Pt would like to be able to get back to functional mobility and get back to working out.   NEXT MD VISIT: 12/26/2023  OBJECTIVE:  Note: Objective measures were completed at Evaluation unless otherwise noted.  DIAGNOSTIC FINDINGS:  1. Mild supraspinatus tendinosis with a tiny insertional interstitial tear. 2. Moderate infraspinatus tendinosis.    PATIENT SURVEYS:  Cindia Crease Next session.  4/17: 34% 5/29:32%  COGNITION: Overall cognitive status: Within functional limits for tasks assessed     SENSATION: WFL  POSTURE: Pt is present in sling.   UPPER EXTREMITY ROM:   Passive ROM Right eval Left eval Left  01/09/24 Lt AROM 4/10 L AROM 4/17 L AROM 5/29   Shoulder flexion   AAROM supine with cane, 155 136 160 161  Shoulder extension     158 (deviates into scaption) 158  Shoulder abduction        Shoulder adduction        Shoulder internal rotation     T6 T6  Shoulder external rotation     T2 (equal to R) T2  (Blank rows = not tested)  UPPER EXTREMITY MMT:  MMT Right eval Left eval R 4/17 L 4/17 L 5/29 Rt/Lt 6/11  Shoulder flexion   4/5 4+/5 4+/5 (some deltoid pain)   Shoulder extension      20.5/23.8  Shoulder abduction   4+/5 4/5 4/5   Shoulder adduction        Shoulder internal rotation   5/5 5/5 5/5   Shoulder external rotation   4+/5 4+/5 4+/5 14.6/13.9  Middle trapezius        Lower trapezius        Elbow flexion        Elbow extension        Wrist flexion        Wrist extension        Wrist ulnar deviation        Wrist radial deviation        Wrist  pronation        Wrist supination        Grip strength (lbs)        (Blank rows = not tested)                                                                                                                                TREATMENT   03/28/24  STM periscapular mm, lats Scapular mobilizations Sidelying shoulder abduction with scap control 2x10 Prone H abduction with scap control 0# 2x10 Prone shoulder extension 2# 2x10 Prone row 4# 2x10 Bil ER YTB 2x10 Reverse wall push ups x10   03/26/24 STM suboccipitals, cervical paraspinals, upper traps Cervical Rt to Lt mobs, distraction, distraction +rotation Supine pec stretch with breathing Supine backstroke Supine scapular mobilization   03/21/24  STM pec,  teres, lats Scapular mobilizations Sidelying short arc abduction with scap control Sidelying shoulder abduction with scap control Prone H abduction with scap control 0# 2x10 Prone shoulder extension 2# 2x10 Prone row 4# 2x10 Resisted H abd YTB 2x10 (tried flexion with this, but pt reported increased UT discomfort) Bil ER YTB 2x10  03/18/24 STM deltoid, lats/teres, triceps Supine shoulder flexion with band between hands RTB  2 x 10  Standing bilateral ER with scap retraction RTB 2 x 10 Supine horizontal abduction RTB 2 x 10 Supine shoulder PNF D2 RTB 2 x 10  Standing shoulder flexion 2# 2 x 10    PATIENT EDUCATION: Education details: HEP,  and POC. Person educated: Patient Education method: Medical illustrator, handout Education comprehension: verbalized understanding and returned demonstration  HOME EXERCISE PROGRAM: Access Code: [email or web address] URL: [email or web address]   ASSESSMENT:   CLINICAL IMPRESSION:   Pt with improving soft tissue restrictions, though still tight into UT. Continued to work on Multimedia programmer. She denied pain with exercises. Will continue to work on improving functional strength, ROM, and stability.     Initial impression: Patient referred to PT for s/p L shoulder arthroscopy with extensive debridement, distal clavicle resection/ acromioplasty. Changed dressings today. Surgical site appears clean with no redness or major swelling noted. She denies any significant pain. Pt provided initial HEP. Patient will benefit from skilled PT to address below impairments, limitations and improve overall function.  OBJECTIVE IMPAIRMENTS: decreased activity tolerance, decreased shoulder mobility, decreased ROM, decreased strength, impaired flexibility, impaired UE use, postural dysfunction, and pain.  ACTIVITY LIMITATIONS: reaching, lifting, carry,  cleaning, driving, and or occupation  PERSONAL FACTORS:  also affecting patient's functional outcome.  REHAB POTENTIAL: Good  CLINICAL DECISION MAKING: Stable/uncomplicated  EVALUATION COMPLEXITY: Low    GOALS: Short term PT Goals Target date: 02/17/2024 Pt will be I and compliant with HEP. Baseline:  Goal status: MET 4/17 Pt will decrease pain by 25% overall Baseline: Goal status: MET 4/17  Long term PT goals Target date: 03/14/2024 Pt will improve Lt shoulder AROM to [facility] to improve functional reaching Baseline: Goal status: IN PROGRESS 5/29 Pt will improve  Lt shoulder strength to at least 4+/5 MMT to improve functional strength Baseline: Goal status: IN PROGRESS 5/29 Pt will improve Quick Dash by 14 points to have significant change per MCID.  Baseline: Goal status:IN PROGRESS 5/29 (2pt improvement)  Pt will reduce pain to overall less than 1-2/10 with usual activity and work activity. Baseline: Goal status: IN PROGRESS 5/29 (can get up to 7/10 with workout classes)      5. Pt will return to recreational activities without significant pain or limitations.   Baseline:   Goal Status: IN PROGRESS 5/29  PLAN: PT FREQUENCY: 1-2 x per week.   PT DURATION: 8-12 weeks.   PLANNED INTERVENTIONS (unless contraindicated): aquatic PT,  Canalith repositioning, cryotherapy, Electrical stimulation, Iontophoresis with 4 mg/ml dexamethasome, Moist heat, traction, Ultrasound, gait training, Therapeutic exercise, balance training, neuromuscular re-education, patient/family education, prosthetic training, manual techniques, passive ROM, dry needling, taping, vasopnuematic device, vestibular, spinal manipulations, joint manipulations  PLAN FOR NEXT SESSION: review/update HEP, progress per protocol.    Judie Noun, PTA 03/28/2024, 3:43 PM     [facility] [address] [place], [place], [address] Phone: [phone]   Fax:  [phone]

## 2024-03-29 ENCOUNTER — Ambulatory Visit
Admission: RE | Admit: 2024-03-29 | Discharge: 2024-03-29 | Disposition: A | Discharge status: Home / Self Care | Source: Ambulatory Visit | Attending: Orthopaedic Surgery | Admitting: Orthopaedic Surgery

## 2024-03-29 DIAGNOSIS — M4802 Spinal stenosis, cervical region: Secondary | ICD-10-CM | POA: Diagnosis not present

## 2024-03-29 DIAGNOSIS — M4722 Other spondylosis with radiculopathy, cervical region: Secondary | ICD-10-CM | POA: Diagnosis not present

## 2024-03-29 DIAGNOSIS — M542 Cervicalgia: Secondary | ICD-10-CM

## 2024-04-04 ENCOUNTER — Encounter (HOSPITAL_BASED_OUTPATIENT_CLINIC_OR_DEPARTMENT_OTHER): Payer: Self-pay | Admitting: Physical Therapy

## 2024-04-04 ENCOUNTER — Ambulatory Visit (HOSPITAL_BASED_OUTPATIENT_CLINIC_OR_DEPARTMENT_OTHER): Payer: Self-pay | Admitting: Physical Therapy

## 2024-04-04 DIAGNOSIS — M6281 Muscle weakness (generalized): Secondary | ICD-10-CM | POA: Diagnosis not present

## 2024-04-04 DIAGNOSIS — R252 Cramp and spasm: Secondary | ICD-10-CM

## 2024-04-04 DIAGNOSIS — R293 Abnormal posture: Secondary | ICD-10-CM

## 2024-04-04 DIAGNOSIS — M25512 Pain in left shoulder: Secondary | ICD-10-CM | POA: Diagnosis not present

## 2024-04-04 NOTE — Therapy (Signed)
 OUTPATIENT PHYSICAL THERAPY SHOULDER TREATMENT        Patient Name: Brianna Gates MRN: [id number] DOB:1950-12-06, 73 y.o., female Today's Date: 04/04/2024   PHYSICAL THERAPY DISCHARGE SUMMARY  Visits from Start of Care: 20  Current functional level related to goals / functional outcomes: See below   Remaining deficits: See below   Education / Equipment: See below   Patient agrees to discharge. Patient goals were partially met. Patient is being discharged due to meeting the stated rehab goals.  Progress Note   Reporting Period 01/31/24 to 04/04/24   See note below for Objective Data and Assessment of Progress/Goals   END OF SESSION:  PT End of Session - 04/04/24 1151     Visit Number 20    Date for PT Re-Evaluation 04/12/24    Authorization Type [employer]    Progress Note Due on Visit 20    PT Start Time 1151    PT Stop Time 1229    PT Time Calculation (min) 38 min    Activity Tolerance Patient tolerated treatment well    Behavior During Therapy WFL for tasks assessed/performed                      Past Medical History:  Diagnosis Date   Allergy    Anxiety    Asthma    INFREQUENT PROBLEM - rarely used inhaler   CAD (coronary artery disease) 06/29/2023   CCTA 06/27/23: CAC score 15.7 (45th percentile), mild nonobstructive CAD [mid LAD 1-24, proximal LCx 1-24]    Cervical spondylosis    Cervical strain 07/22/2014   Chest pain of uncertain etiology 12/13/2021   Degenerative arthritis    Dyslipidemia    Eczema    GERD (gastroesophageal reflux disease)    Guillain-Barre syndrome (HCC)    History of Guillain-Barre syndrome 07/22/2014   Hyperlipidemia    Hypertension    Hypothyroidism    Lumbosacral spondylosis    Palpitations 12/12/2021   Pure hypercholesterolemia 12/13/2021   Raynaud disease    Thyroid  nodule    Past Surgical History:  Procedure Laterality Date   ABDOMINAL HYSTERECTOMY     COLONOSCOPY      ESOPHAGOGASTRODUODENOSCOPY (EGD) WITH PROPOFOL  N/A 09/13/2021   Procedure: ESOPHAGOGASTRODUODENOSCOPY (EGD) WITH PROPOFOL ;  Surgeon: Tobin Forts, MD;  Location: [facility];  Service: Endoscopy;  Laterality: N/A;   FOOT SURGERY Left    GANGLION CYST EXCISION Left    Wrist   HERNIA REPAIR     KNEE ARTHROSCOPY     RT KNEE   KNEE ARTHROTOMY Right 08/27/2013   Procedure: RIGHT KNEE SCAR EXCISION AND FEMORAL REVISION;  Surgeon: Aurther Blue, MD;  Location: [facility];  Service: Orthopedics;  Laterality: Right;   REPLACEMENT TOTAL KNEE Right    2014, 2021   REPLACEMENT TOTAL KNEE BILATERAL Bilateral    SHOULDER ARTHROSCOPY WITH DISTAL CLAVICLE RESECTION Left 12/18/2023   Procedure: LEFT SHOULDER ARTHROSCOPY WITH EXTENSIVE DEBRIDEMENT, DISTAL CLAVICLE RESECTION / ACROMIOPLASTY;  Surgeon: Wilhelmenia Harada, MD;  Location: [facility];  Service: Orthopedics;  Laterality: Left;   STRABISMUS SURGERY Right    TONSILLECTOMY     vaginal vulvo prolapse  09/16/2015   Patient Active Problem List   Diagnosis Date Noted   Anxiety 01/30/2024   Peripheral neuropathy 01/30/2024   Tendinitis of left rotator cuff 12/18/2023   Arthritis of left acromioclavicular joint 12/18/2023   CAD (coronary artery disease) 06/29/2023   Paresthesia 10/18/2022   Onychomycosis 01/29/2022  Chest pain of uncertain etiology 12/13/2021   Pure hypercholesterolemia 12/13/2021   Palpitations 12/12/2021   Esophageal dysphagia    Allergic rhinitis 04/04/2021   Allergic rhinitis due to animal (cat) (dog) hair and dander 04/04/2021   Allergic rhinitis due to pollen 04/04/2021   Hormone deficiency 04/04/2021   Syncope and collapse 12/27/2020   Hypotension 12/27/2020   Bradycardia 12/27/2020   History of COVID-19 12/27/2020   Preoperative evaluation to rule out surgical contraindication 07/16/2020   Arthrofibrosis of knee joint, right 11/26/2019   Arthritis of knee 07/30/2019   Presence of right artificial knee  joint 07/30/2019   Sleep apnea 10/16/2018   Shoulder impingement syndrome 01/15/2018   Gastroesophageal reflux disease 02/02/2016   Sensorineural hearing loss (SNHL), bilateral 02/02/2016   Vaginal vault prolapse after hysterectomy 07/28/2015   History of Guillain-Barre syndrome 07/22/2014   Cervical strain 07/22/2014   Postoperative stiffness of total knee replacement (HCC) 08/27/2013   Abnormality of gait 08/22/2012   Cervical spondylosis without myelopathy 08/22/2012   Muscle weakness (generalized) 08/22/2012   Guillain-Barre syndrome (HCC) 08/22/2012   Essential hypertension 08/22/2012   Strabismus 08/22/2012   Degenerative joint disease of knee, left 08/22/2012   Hypothyroidism 08/22/2012   Raynaud's disease 08/22/2012   Knee joint replacement by other means 08/22/2012    PCP: Lonzie Robins, MD  REFERRING PROVIDER: Wilhelmenia Harada, MD  REFERRING DIAG: Tendinitis of left rotator cuff [M75.82]  Arthroscopic extensive debridement - 29823 Supraspinatus Tendon, Anterior Labrum, Superior Labrum, and Posterior Labrum Arthroscopic distal clavicle excision - 96045 Arthroscopic subacromial decompression - 29826  THERAPY DIAG:  Abnormal posture  Cramp and spasm  Muscle weakness (generalized)  Acute pain of left shoulder  Rationale for Evaluation and Treatment: Rehabilitation  ONSET DATE: DOS 12/18/2023  SUBJECTIVE:                                                                                                                                                                                      SUBJECTIVE STATEMENT: Patient states Did water  aerobics today. Feels crunchy when reaching up overhead. Patient states 50% functional status. Cortisone shot helped it some. Remains limited by uncomfortable crunching feeling. Has some weakness still with lifting.    Initial evaluation:  Pt presents to PT post op L shoulder arthroscopy with extensive debridement, distal clavicle  resection/ acromioplasty. She reports some mild pain in her L shoulder since surgery but thinks it is due to the incisions rubbing against the dressing. She denies any issues with sleeping. She continues to have numbness in her fingers from her recent nerve block. Prior to sx, she was unable to reach [facility], out to the side, and had  difficulty dressing her bed.   Hand dominance: Right  PERTINENT HISTORY: HTN, GERD, Gullian- Barre syndrome.   PAIN:  Are you having pain? No: NPRS scale: 0/10 Pain location:  Pain description: Aggravating factors:  Relieving factors:    PRECAUTIONS: Shoulder  RED FLAGS: None   WEIGHT BEARING RESTRICTIONS: No  FALLS:  Has patient fallen in last 6 months? No  LIVING ENVIRONMENT: Lives with: lives with their spouse Lives in: House/apartment Stairs: Yes: Internal: 1 flight steps; on right going up and External: 4-5 steps; bilateral but cannot reach both Has following equipment at home: shower chair and Grab bars  OCCUPATION: Retired   PLOF: Independent  PATIENT GOALS: Pt would like to be able to get back to functional mobility and get back to working out.   NEXT MD VISIT: 12/26/2023  OBJECTIVE:  Note: Objective measures were completed at Evaluation unless otherwise noted.  DIAGNOSTIC FINDINGS:  1. Mild supraspinatus tendinosis with a tiny insertional interstitial tear. 2. Moderate infraspinatus tendinosis.    PATIENT SURVEYS:  Cindia Crease Next session.  4/17: 34% 5/29:32%  04/04/24: 43%  COGNITION: Overall cognitive status: Within functional limits for tasks assessed     SENSATION: WFL  POSTURE: Pt is present in sling.   UPPER EXTREMITY ROM:   Passive ROM Right eval Left eval Left  01/09/24 Lt AROM 4/10 L AROM 4/17 L AROM 5/29  L AROM 04/04/24  Shoulder flexion   AAROM supine with cane, 155 136 160 161 161  Shoulder extension     158 (deviates into scaption) 158 163  Shoulder abduction         Shoulder adduction          Shoulder internal rotation     T6 T6 T6  Shoulder external rotation     T2 (equal to R) T2 T2  (Blank rows = not tested)  UPPER EXTREMITY MMT:  MMT Right eval Left eval R 4/17 L 4/17 L 5/29 Rt/Lt 6/11 R 04/04/24 Left 04/04/24  Shoulder flexion   4/5 4+/5 4+/5 (some deltoid pain)  5 4+  Shoulder extension      20.5/23.8    Shoulder abduction   4+/5 4/5 4/5  5 4+  Shoulder adduction          Shoulder internal rotation   5/5 5/5 5/5  5 5   Shoulder external rotation   4+/5 4+/5 4+/5 14.6/13.9 5 4+  Middle trapezius          Lower trapezius          Elbow flexion          Elbow extension          Wrist flexion          Wrist extension          Wrist ulnar deviation          Wrist radial deviation          Wrist pronation          Wrist supination          Grip strength (lbs)          (Blank rows = not tested)  TREATMENT 04/04/24 Reassessment Education on anatomy, pathology, shoulder mechanics, Self STM   03/28/24  STM periscapular mm, lats Scapular mobilizations Sidelying shoulder abduction with scap control 2x10 Prone H abduction with scap control 0# 2x10 Prone shoulder extension 2# 2x10 Prone row 4# 2x10 Bil ER YTB 2x10 Reverse wall push ups x10   03/26/24 STM suboccipitals, cervical paraspinals, upper traps Cervical Rt to Lt mobs, distraction, distraction +rotation Supine pec stretch with breathing Supine backstroke Supine scapular mobilization   03/21/24  STM pec, teres, lats Scapular mobilizations Sidelying short arc abduction with scap control Sidelying shoulder abduction with scap control Prone H abduction with scap control 0# 2x10 Prone shoulder extension 2# 2x10 Prone row 4# 2x10 Resisted H abd YTB 2x10 (tried flexion with this, but pt reported increased UT discomfort) Bil ER YTB 2x10  03/18/24 STM deltoid, lats/teres,  triceps Supine shoulder flexion with band between hands RTB  2 x 10  Standing bilateral ER with scap retraction RTB 2 x 10 Supine horizontal abduction RTB 2 x 10 Supine shoulder PNF D2 RTB 2 x 10  Standing shoulder flexion 2# 2 x 10    PATIENT EDUCATION: Education details: HEP,  and POC. 6/20 anatomy, pathology, shoulder mechanics, Self STM Person educated: Patient Education method: Explanation and Demonstration, handout Education comprehension: verbalized understanding and returned demonstration  HOME EXERCISE PROGRAM: Access Code: [email or web address] URL: [email or web address]   ASSESSMENT:   CLINICAL IMPRESSION:   Patient has met 2/2 short term goals and 3/5 long term goals with ability to complete HEP and improvement in symptoms, strength, ROM, activity tolerance, and functional mobility. Remaining goals not met due to continued deficits in symptoms, activity tolerance. Patient has made good progress toward remaining goals. Symptoms likely related scapula sliding over other muscles  in the region. Discussed continued HEP and patient will return to PT if needed.    Initial impression: Patient referred to PT for s/p L shoulder arthroscopy with extensive debridement, distal clavicle resection/ acromioplasty. Changed dressings today. Surgical site appears clean with no redness or major swelling noted. She denies any significant pain. Pt provided initial HEP. Patient will benefit from skilled PT to address below impairments, limitations and improve overall function.  OBJECTIVE IMPAIRMENTS: decreased activity tolerance, decreased shoulder mobility, decreased ROM, decreased strength, impaired flexibility, impaired UE use, postural dysfunction, and pain.  ACTIVITY LIMITATIONS: reaching, lifting, carry,  cleaning, driving, and or occupation  PERSONAL FACTORS:  also affecting patient's functional outcome.  REHAB POTENTIAL: Good  CLINICAL DECISION MAKING:  Stable/uncomplicated  EVALUATION COMPLEXITY: Low    GOALS: Short term PT Goals Target date: 02/17/2024 Pt will be I and compliant with HEP. Baseline:  Goal status: MET 4/17 Pt will decrease pain by 25% overall Baseline: Goal status: MET 4/17  Long term PT goals Target date: 03/14/2024 Pt will improve Lt shoulder AROM to [facility] to improve functional reaching Baseline: Goal status: MET Pt will improve  Lt shoulder strength to at least 4+/5 MMT to improve functional strength Baseline: Goal status: MET Pt will improve Quick Dash by 14 points to have significant change per MCID.  Baseline: Goal status:IN PROGRESS 5/29 (2pt improvement)  Pt will reduce pain to overall less than 1-2/10 with usual activity and work activity. Baseline: Goal status: IN PROGRESS 5/29 (can get up to 7/10 with workout classes) 04/04/24: 5/10 sometimes with working out      5. Pt will return to recreational activities without significant pain or limitations.   Baseline:   Goal Status: MET  PLAN: PT FREQUENCY:  1-2 x per week.   PT DURATION: 8-12 weeks.   PLANNED INTERVENTIONS (unless contraindicated): aquatic PT, Canalith repositioning, cryotherapy, Electrical stimulation, Iontophoresis with 4 mg/ml dexamethasome, Moist heat, traction, Ultrasound, gait training, Therapeutic exercise, balance training, neuromuscular re-education, patient/family education, prosthetic training, manual techniques, passive ROM, dry needling, taping, vasopnuematic device, vestibular, spinal manipulations, joint manipulations  PLAN FOR NEXT SESSION: review/update HEP, progress per protocol.    Beather Liming, PT, DPT 04/04/2024, 11:52 AM     [facility] [address] [place], [place], [address] Phone: [phone]   Fax:  [phone]

## 2024-04-07 DIAGNOSIS — J3081 Allergic rhinitis due to animal (cat) (dog) hair and dander: Secondary | ICD-10-CM | POA: Diagnosis not present

## 2024-04-07 DIAGNOSIS — J3089 Other allergic rhinitis: Secondary | ICD-10-CM | POA: Diagnosis not present

## 2024-04-07 DIAGNOSIS — J301 Allergic rhinitis due to pollen: Secondary | ICD-10-CM | POA: Diagnosis not present

## 2024-04-09 ENCOUNTER — Ambulatory Visit (HOSPITAL_BASED_OUTPATIENT_CLINIC_OR_DEPARTMENT_OTHER): Admitting: Orthopaedic Surgery

## 2024-04-09 DIAGNOSIS — M542 Cervicalgia: Secondary | ICD-10-CM

## 2024-04-09 NOTE — Progress Notes (Signed)
 Chief Complaint: Left shoulder acromioplasty with distal clavicectomy 12/18/23     History of Present Illness:   04/09/2024: Presents today for MRI review of the cervical spine.   PMH/PSH/Family History/Social History/Meds/Allergies:    Past Medical History:  Diagnosis Date   Allergy    Anxiety    Asthma    INFREQUENT PROBLEM - rarely used inhaler   CAD (coronary artery disease) 06/29/2023   CCTA 06/27/23: CAC score 15.7 (45th percentile), mild nonobstructive CAD [mid LAD 1-24, proximal LCx 1-24]    Cervical spondylosis    Cervical strain 07/22/2014   Chest pain of uncertain etiology 12/13/2021   Degenerative arthritis    Dyslipidemia    Eczema    GERD (gastroesophageal reflux disease)    Guillain-Barre syndrome (HCC)    History of Guillain-Barre syndrome 07/22/2014   Hyperlipidemia    Hypertension    Hypothyroidism    Lumbosacral spondylosis    Palpitations 12/12/2021   Pure hypercholesterolemia 12/13/2021   Raynaud disease    Thyroid  nodule    Past Surgical History:  Procedure Laterality Date   ABDOMINAL HYSTERECTOMY     COLONOSCOPY     ESOPHAGOGASTRODUODENOSCOPY (EGD) WITH PROPOFOL  N/A 09/13/2021   Procedure: ESOPHAGOGASTRODUODENOSCOPY (EGD) WITH PROPOFOL ;  Surgeon: Abran Norleen SAILOR, MD;  Location: [facility];  Service: Endoscopy;  Laterality: N/A;   FOOT SURGERY Left    GANGLION CYST EXCISION Left    Wrist   HERNIA REPAIR     KNEE ARTHROSCOPY     RT KNEE   KNEE ARTHROTOMY Right 08/27/2013   Procedure: RIGHT KNEE SCAR EXCISION AND FEMORAL REVISION;  Surgeon: Dempsey LULLA Moan, MD;  Location: [facility];  Service: Orthopedics;  Laterality: Right;   REPLACEMENT TOTAL KNEE Right    2014, 2021   REPLACEMENT TOTAL KNEE BILATERAL Bilateral    SHOULDER ARTHROSCOPY WITH DISTAL CLAVICLE RESECTION Left 12/18/2023   Procedure: LEFT SHOULDER ARTHROSCOPY WITH EXTENSIVE DEBRIDEMENT, DISTAL CLAVICLE RESECTION / ACROMIOPLASTY;  Surgeon: Genelle Standing, MD;  Location: [facility];  Service: Orthopedics;  Laterality: Left;   STRABISMUS SURGERY Right    TONSILLECTOMY     vaginal vulvo prolapse  09/16/2015   Social History   Socioeconomic History   Marital status: Married    Spouse name: Not on file   Number of children: 2   Years of education: college   Highest education level: Not on file  Occupational History    Employer: UNEMPLOYED  Tobacco Use   Smoking status: Never   Smokeless tobacco: Never  Vaping Use   Vaping status: Never Used  Substance and Sexual Activity   Alcohol  use: Not Currently    Comment: stopped drinking   Drug use: No   Sexual activity: Yes    Birth control/protection: Other-see comments, Post-menopausal    Comment: Hysterectomy  Other Topics Concern   Not on file  Social History Narrative   Lives at home, married   Patient is right handed.   Patient drinks 2-3 cups caffeine daily.   Social Drivers of Corporate investment banker Strain: Not on file  Food Insecurity: Not on file  Transportation Needs: Not on file  Physical Activity: Not on file  Stress: Not on file  Social Connections: Not on file   Family History  Problem Relation Age of Onset   Hypertension Mother    Other Mother        Dyslipidemia   Stroke Mother    Heart attack Father  40s   Heart disease Father    Hemochromatosis Father    Hemochromatosis Brother    Hyperlipidemia Brother    Hypertension Brother    Other Brother        Dyslipidemia   Stomach cancer Paternal Uncle    Colon cancer Neg Hx    Rectal cancer Neg Hx    Esophageal cancer Neg Hx    Allergies  Allergen Reactions   Captopril Other (See Comments)    reaction   Oxaprozin Hives and Other (See Comments)   Ramipril Cough and Other (See Comments)   Zoloft [Sertraline Hcl] Diarrhea   Ace Inhibitors     Other reaction(s): Other (See Comments) GB syndrome   Doxycycline Other (See Comments)    Burning sensation/Rash   Influenza Vac Split Quad     Other  Reaction(s): Guillain-Barr syndrome   Influenza Vaccines     HX    Sulfa Antibiotics Other (See Comments)    unknown   Current Outpatient Medications  Medication Sig Dispense Refill   acetaminophen  (TYLENOL ) 650 MG CR tablet Take 1,300 mg by mouth every 8 (eight) hours as needed for pain or fever.     albuterol  (PROVENTIL  HFA;VENTOLIN  HFA) 108 (90 BASE) MCG/ACT inhaler Inhale 2 puffs into the lungs every 6 (six) hours as needed for wheezing or shortness of breath.     amLODipine  (NORVASC ) 2.5 MG tablet Take 1 tablet (2.5 mg total) by mouth every evening. 90 tablet 3   amoxicillin (AMOXIL) 500 MG capsule Take 2,000 mg by mouth once as needed (prior to dental appointments).     Ascorbic Acid (VITAMIN C PO) Take 1 tablet by mouth as needed (sick symptoms).     aspirin  EC 325 MG tablet Take 1 tablet (325 mg total) by mouth daily. 14 tablet 0   aspirin  EC 81 MG tablet Take 81 mg by mouth daily. Swallow whole.     azelastine (ASTELIN) 0.1 % nasal spray Place 1 spray into both nostrils 2 (two) times daily as needed (cold symptoms).     busPIRone (BUSPAR) 30 MG tablet Take 30 mg by mouth 2 (two) times daily.     Cholecalciferol (VITAMIN D ) 2000 UNITS CAPS Take 2,000 Units by mouth daily in the afternoon.     COVID-19 mRNA vaccine 2023-2024 (COMIRNATY ) SUSP injection Inject into the muscle. 0.3 mL 0   COVID-19 mRNA vaccine, Pfizer, (COMIRNATY ) syringe Inject into the muscle. 0.3 mL 0   DULoxetine  (CYMBALTA ) 30 MG capsule Take 1 capsule (30 mg total) by mouth daily. 30 capsule 11   Emollient (CETAPHIL) cream Apply 1 application topically daily.     EPINEPHrine  0.3 mg/0.3 mL IJ SOAJ injection Inject 0.3 mg into the muscle once as needed for anaphylaxis.     estradiol  (ESTRACE ) 0.1 MG/GM vaginal cream Place 1 Applicatorful vaginally 2 (two) times a week.     fexofenadine-pseudoephedrine (ALLEGRA-D 24) 180-240 MG 24 hr tablet Take 1 tablet by mouth daily.     gabapentin  (NEURONTIN ) 400 MG capsule Take 1  capsule (400 mg total) by mouth 3 (three) times daily. 90 capsule 11   ibuprofen (ADVIL,MOTRIN) 100 MG tablet Take 400 mg by mouth daily at 12 noon. May take in the pm additionally as needed     ketoconazole  (NIZORAL ) 2 % cream Apply 1 application topically daily. (Patient taking differently: Apply 1 application  topically as needed.) 60 g 0   levothyroxine  (SYNTHROID ) 50 MCG tablet Take 50 mcg by mouth daily before breakfast.  losartan  (COZAAR ) 50 MG tablet Take 1 tablet (50 mg total) by mouth in the morning. 90 tablet 3   montelukast  (SINGULAIR ) 10 MG tablet Take 10 mg by mouth at bedtime.     oxyCODONE  (ROXICODONE ) 5 MG immediate release tablet Take 1 tablet (5 mg total) by mouth every 4 (four) hours as needed for severe pain (pain score 7-10) or breakthrough pain. 30 tablet 0   pantoprazole  (PROTONIX ) 40 MG tablet Take 1 tablet (40 mg total) by mouth 2 (two) times daily. Office visit for further refills (Patient taking differently: Take 40 mg by mouth as needed. Office visit for further refills) 180 tablet 0   rosuvastatin  (CRESTOR ) 10 MG tablet Take 1 tablet (10 mg total) by mouth every other day. 90 tablet 3   Simethicone (PHAZYME PO) Take 250 mg by mouth daily as needed (Gas).     triamcinolone  cream (KENALOG ) 0.1 % 1 application as needed (itching bumps on stomach). Mixed with Cetaphil     VEVYE 0.1 % SOLN Apply 1 drop to eye 2 (two) times daily.     No current facility-administered medications for this visit.   No results found.  Review of Systems:   A ROS was performed including pertinent positives and negatives as documented in the HPI.  Physical Exam :   Constitutional: NAD and appears stated age Neurological: Alert and oriented Psych: Appropriate affect and cooperative There were no vitals taken for this visit.   Comprehensive Musculoskeletal Exam:    Left shoulder incisions are well-appearing without erythema or drainage.  Active forward elevation is 160 compared to 160  on the contralateral side.  External rotation is to 30 degrees compared to 50 on the contralateral side.  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   Assessment and Plan:   73 y.o. female status post acromioplasty and arthroscopic debridement.  At today's visit she is having some neuroforaminal nerve impingement at the C5-C6 level which I do believe is resulting in her neck pain that does radiate.  Scapular motion is much improved with physical therapy.  Overall doing quite well from this perspective.  Will place a referral to Dr. Eldonna for a left-sided C5-C6 neuroforaminal injection   - Plan for referral to Dr. Eldonna for C5-C6 left-sided neuroforaminal injection     I personally saw and evaluated the patient, and participated in the management and treatment plan.  Elspeth Parker, MD Attending Physician, Orthopedic Surgery  This document was dictated using Dragon voice recognition software. A reasonable attempt at proof reading has been made to minimize errors.

## 2024-04-14 ENCOUNTER — Ambulatory Visit: Admitting: Podiatry

## 2024-04-14 DIAGNOSIS — B351 Tinea unguium: Secondary | ICD-10-CM | POA: Diagnosis not present

## 2024-04-14 DIAGNOSIS — M79675 Pain in left toe(s): Secondary | ICD-10-CM

## 2024-04-14 DIAGNOSIS — M79674 Pain in right toe(s): Secondary | ICD-10-CM | POA: Diagnosis not present

## 2024-04-14 DIAGNOSIS — J0100 Acute maxillary sinusitis, unspecified: Secondary | ICD-10-CM | POA: Diagnosis not present

## 2024-04-14 DIAGNOSIS — J309 Allergic rhinitis, unspecified: Secondary | ICD-10-CM | POA: Diagnosis not present

## 2024-04-14 NOTE — Patient Instructions (Signed)
 You can use some tea tree oil on the nails

## 2024-04-16 NOTE — Progress Notes (Signed)
 Subjective: Chief Complaint  Patient presents with   RFC    RM#13 RFC patient states needs right great toe nail needs cutting and evaluated for ingrown.     73 year old female presents the office today for follow-up evaluation of ingrown toenail on both big toes.  She states that they have gotten down in the corners again causing pain.  No swelling or redness or any drainage.  No injuries.  She has no other concerns today.   Objective: AAO x3, NAD DP/PT pulses palpable bilaterally, CRT less than 3 seconds Ingrown toenails noted along the distal aspect of bilateral hallux toenails on both medial, lateral nail borders.  They appear to be somewhat deeper compared to prior.  Is no surrounding erythema, ascending size but is no drainage or pus.  No signs of infection. No pain with calf compression, swelling, warmth, erythema  Assessment: Ingrown toenail  Plan: -All treatment options discussed with the patient including all alternatives, risks, complications.  -I did sharply debride the corners of the ingrown toenail without any complications or bleeding today x 3.  As a courtesy debride the nails without any complications or bleeding.  Return in about 9 weeks (around 06/16/2024).  Brianna Gates DPM   Brianna Gates DPM

## 2024-04-21 ENCOUNTER — Encounter (HOSPITAL_BASED_OUTPATIENT_CLINIC_OR_DEPARTMENT_OTHER): Payer: Self-pay

## 2024-04-21 DIAGNOSIS — J3089 Other allergic rhinitis: Secondary | ICD-10-CM | POA: Diagnosis not present

## 2024-04-21 DIAGNOSIS — J3081 Allergic rhinitis due to animal (cat) (dog) hair and dander: Secondary | ICD-10-CM | POA: Diagnosis not present

## 2024-04-29 DIAGNOSIS — J3081 Allergic rhinitis due to animal (cat) (dog) hair and dander: Secondary | ICD-10-CM | POA: Diagnosis not present

## 2024-04-29 DIAGNOSIS — J301 Allergic rhinitis due to pollen: Secondary | ICD-10-CM | POA: Diagnosis not present

## 2024-04-29 DIAGNOSIS — J3089 Other allergic rhinitis: Secondary | ICD-10-CM | POA: Diagnosis not present

## 2024-04-29 DIAGNOSIS — F411 Generalized anxiety disorder: Secondary | ICD-10-CM | POA: Diagnosis not present

## 2024-05-06 DIAGNOSIS — J3081 Allergic rhinitis due to animal (cat) (dog) hair and dander: Secondary | ICD-10-CM | POA: Diagnosis not present

## 2024-05-06 DIAGNOSIS — J3089 Other allergic rhinitis: Secondary | ICD-10-CM | POA: Diagnosis not present

## 2024-05-06 DIAGNOSIS — J301 Allergic rhinitis due to pollen: Secondary | ICD-10-CM | POA: Diagnosis not present

## 2024-05-08 ENCOUNTER — Ambulatory Visit: Admitting: Physical Medicine and Rehabilitation

## 2024-05-08 ENCOUNTER — Other Ambulatory Visit: Payer: Self-pay

## 2024-05-08 VITALS — BP 119/73 | HR 71

## 2024-05-08 DIAGNOSIS — M5412 Radiculopathy, cervical region: Secondary | ICD-10-CM | POA: Diagnosis not present

## 2024-05-08 MED ORDER — METHYLPREDNISOLONE ACETATE 40 MG/ML IJ SUSP
40.0000 mg | Freq: Once | INTRAMUSCULAR | Status: AC
Start: 2024-05-08 — End: 2024-05-08
  Administered 2024-05-08: 40 mg

## 2024-05-08 NOTE — Progress Notes (Signed)
 Pain Scale   Average Pain 5 Patient advising she had chronic neck pain and pain increases when she is reading. Patient advising she gets dome relief with rest.        +Driver, -BT, -Dye Allergies.

## 2024-05-08 NOTE — Patient Instructions (Signed)

## 2024-05-09 DIAGNOSIS — H04123 Dry eye syndrome of bilateral lacrimal glands: Secondary | ICD-10-CM | POA: Diagnosis not present

## 2024-05-09 DIAGNOSIS — H02889 Meibomian gland dysfunction of unspecified eye, unspecified eyelid: Secondary | ICD-10-CM | POA: Diagnosis not present

## 2024-05-09 DIAGNOSIS — H01009 Unspecified blepharitis unspecified eye, unspecified eyelid: Secondary | ICD-10-CM | POA: Diagnosis not present

## 2024-05-12 NOTE — Progress Notes (Signed)
 Brianna Gates - 73 y.o. female MRN [id number]  Date of birth: 09/12/1951  Office Visit Note: Visit Date: 05/08/2024 PCP: Janey Santos, MD Referred by: Genelle Standing, MD  Subjective: Chief Complaint  Patient presents with   Neck - Pain   HPI:  Brianna Gates is a 73 y.o. female who comes in today at the request of Dr. Standing Genelle for planned Left C7-T1 Cervical Interlaminar epidural steroid injection with fluoroscopic guidance.  The patient has failed conservative care including home exercise, medications, time and activity modification.  This injection will be diagnostic and hopefully therapeutic.  Please see requesting physician notes for further details and justification.  She has been having chronic bilateral neck pain really centered about the trapezius musculature in the upper back but also left shoulder pain.  Nothing really down into the arms of the speak.  Symptoms.  Pain.  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  MRI did show left C6 moderate foraminal stenosis among Bilateral mild stenosis in the foramen but no central stenosis no disc herniation or nerve compression.  Dr. Genelle had suggested C6 transforaminal injection.  We actually do not do many transforaminal injections in the cervical spine secondary to increased risk in the office.  These have not been shown to be any better at relieving pain than the interlaminar injections.  Likewise, the cervical interlaminar injections are always performed of the C7-T1 level duty safety concerns.   ROS Otherwise per HPI.  Assessment & Plan: Visit Diagnoses:    ICD-10-CM   1. Cervical radiculopathy  M54.12 XR C-ARM NO REPORT    Epidural Steroid injection    methylPREDNISolone  acetate (DEPO-MEDROL ) injection 40 mg      Plan: No additional findings.   Meds & Orders:  Meds ordered this encounter  Medications   methylPREDNISolone  acetate (DEPO-MEDROL ) injection 40 mg    Orders Placed This  Encounter  Procedures   XR C-ARM NO REPORT   Epidural Steroid injection    Follow-up: Return for Standing Genelle, MD.   Procedures: No procedures performed  Cervical Epidural Steroid Injection - Interlaminar Approach with Fluoroscopic Guidance  Patient: Brianna Gates      Date of Birth: [birth year] MRN: [id number] PCP: Janey Santos, MD      Visit Date: 05/08/2024   Universal Protocol:    Date/Time: 07/28/256:51 AM  Consent Given By: the patient  Position: PRONE  Additional Comments: Vital signs were monitored before and after the procedure. Patient was prepped and draped in the usual sterile fashion. The correct patient, procedure, and site was verified.   Injection Procedure Details:   Procedure diagnoses: Cervical radiculopathy [M54.12]    Meds Administered:  Meds ordered this encounter  Medications   methylPREDNISolone  acetate (DEPO-MEDROL ) injection 40 mg     Laterality: Left  Location/Site: C7-T1  Needle: 3.5 in., 20 ga. Tuohy  Needle Placement: Paramedian epidural space  Findings:  -Comments: Excellent flow of contrast into the epidural space.  Procedure Details: Using a paramedian approach from the side mentioned above, the region overlying the inferior lamina was localized under fluoroscopic visualization and the soft tissues overlying this structure were infiltrated with 4 ml. of 1% Lidocaine  without Epinephrine . A # 20 gauge, Tuohy needle was inserted into the epidural space using a paramedian approach.  The epidural space was localized using loss of resistance along with contralateral oblique bi-planar fluoroscopic views.  After negative aspirate for air, blood, and CSF, a 2 ml. volume of Isovue-250 was injected into  the epidural space and the flow of contrast was observed. Radiographs were obtained for documentation purposes.   The injectate was administered into the level noted above.  Additional Comments:  The patient tolerated the  procedure well Dressing: 2 x 2 sterile gauze and Band-Aid    Post-procedure details: Patient was observed during the procedure. Post-procedure instructions were reviewed.  Patient left the clinic in stable condition.   Clinical History: MRI CERVICAL SPINE WITHOUT CONTRAST   TECHNIQUE: Multiplanar, multisequence MR imaging of the cervical spine was performed. No intravenous contrast was administered.   COMPARISON:  Prior radiograph from 08/13/2023.   FINDINGS: Alignment: Trace scoliosis. Trace facet mediated anterolisthesis of C7 on T1 and T1 on T2. Alignment otherwise normal preservation of the normal cervical lordosis.   Vertebrae: Vertebral body height maintained without acute or chronic fracture. Bone marrow signal intensity within normal limits. No discrete or worrisome osseous lesions. No abnormal marrow edema.   Cord: Normal signal and morphology.   Posterior Fossa, vertebral arteries, paraspinal tissues: Visualized brain and posterior fossa within normal limits. Craniocervical junction normal. Paraspinous soft tissues within normal limits. Normal flow voids seen within the vertebral arteries bilaterally. 5 mm left thyroid  nodule noted, of doubtful significance given size and patient age, no follow-up imaging recommended (ref: J Am Coll Radiol. 2015 Feb;12(2): 143-50).   Disc levels:   C2-C3: Unremarkable.   C3-C4: Diffuse disc bulge with bilateral uncovertebral spurring. No spinal stenosis. Mild bilateral C4 foraminal stenosis.   C4-C5: Disc bulge with uncovertebral spurring. Mild left-sided facet hypertrophy. No spinal stenosis. Mild bilateral C5 foraminal stenosis.   C5-C6: Disc bulge with uncovertebral spurring. Moderate right facet hypertrophy. No spinal stenosis. Moderate left C6 foraminal narrowing. Right neural foramen remains adequately patent.   C6-C7: Disc bulge with uncovertebral spurring. Mild to moderate bilateral facet hypertrophy. No spinal  stenosis. Mild left C7 foraminal narrowing. Right neural foramina remains patent.   C7-T1: Negative interspace. Moderate facet hypertrophy. No canal or stenosis. Foramina remain adequately patent.   IMPRESSION: 1. Mild for age multilevel cervical spondylosis without significant spinal stenosis or overt neural impingement. 2. Multifactorial degenerative changes with resultant multilevel foraminal narrowing as above. Notable findings include mild bilateral C4 and C5 foraminal stenosis, moderate left C6 foraminal narrowing, with mild left C7 foraminal stenosis. 3. Mild-to-moderate multilevel facet hypertrophy as above. Finding could contribute to neck pain.     Electronically Signed   By: Morene Hoard M.D.   On: 04/13/2024 15:57     Objective:  VS:  HT:    WT:   BMI:     BP:119/73  HR:71bpm  TEMP: ( )  RESP:  Physical Exam Vitals and nursing note reviewed.  Constitutional:      General: She is not in acute distress.    Appearance: Normal appearance. She is not ill-appearing.  HENT:     Head: Normocephalic and atraumatic.     Right Ear: External ear normal.     Left Ear: External ear normal.  Eyes:     Extraocular Movements: Extraocular movements intact.  Cardiovascular:     Rate and Rhythm: Normal rate.     Pulses: Normal pulses.  Musculoskeletal:     Cervical back: Tenderness present. No rigidity.     Right lower leg: No edema.     Left lower leg: No edema.     Comments: Patient has good strength in the upper extremities including 5 out of 5 strength in wrist extension long finger flexion and APB.  There is no atrophy of the hands intrinsically.  There is a negative Hoffmann's test.   Lymphadenopathy:     Cervical: No cervical adenopathy.  Skin:    Findings: No erythema, lesion or rash.  Neurological:     General: No focal deficit present.     Mental Status: She is alert and oriented to person, place, and time.     Sensory: No sensory deficit.      Motor: No weakness or abnormal muscle tone.     Coordination: Coordination normal.  Psychiatric:        Mood and Affect: Mood normal.        Behavior: Behavior normal.      Imaging: No results found.

## 2024-05-12 NOTE — Procedures (Signed)
 Cervical Epidural Steroid Injection - Interlaminar Approach with Fluoroscopic Guidance  Patient: Brianna Gates      Date of Birth: 03/01/1951 MRN: [id number] PCP: Janey Santos, MD      Visit Date: 05/08/2024   Universal Protocol:    Date/Time: 07/28/256:51 AM  Consent Given By: the patient  Position: PRONE  Additional Comments: Vital signs were monitored before and after the procedure. Patient was prepped and draped in the usual sterile fashion. The correct patient, procedure, and site was verified.   Injection Procedure Details:   Procedure diagnoses: Cervical radiculopathy [M54.12]    Meds Administered:  Meds ordered this encounter  Medications   methylPREDNISolone  acetate (DEPO-MEDROL ) injection 40 mg     Laterality: Left  Location/Site: C7-T1  Needle: 3.5 in., 20 ga. Tuohy  Needle Placement: Paramedian epidural space  Findings:  -Comments: Excellent flow of contrast into the epidural space.  Procedure Details: Using a paramedian approach from the side mentioned above, the region overlying the inferior lamina was localized under fluoroscopic visualization and the soft tissues overlying this structure were infiltrated with 4 ml. of 1% Lidocaine  without Epinephrine . A # 20 gauge, Tuohy needle was inserted into the epidural space using a paramedian approach.  The epidural space was localized using loss of resistance along with contralateral oblique bi-planar fluoroscopic views.  After negative aspirate for air, blood, and CSF, a 2 ml. volume of Isovue-250 was injected into the epidural space and the flow of contrast was observed. Radiographs were obtained for documentation purposes.   The injectate was administered into the level noted above.  Additional Comments:  The patient tolerated the procedure well Dressing: 2 x 2 sterile gauze and Band-Aid    Post-procedure details: Patient was observed during the procedure. Post-procedure instructions were  reviewed.  Patient left the clinic in stable condition.

## 2024-05-20 DIAGNOSIS — J3089 Other allergic rhinitis: Secondary | ICD-10-CM | POA: Diagnosis not present

## 2024-05-20 DIAGNOSIS — J301 Allergic rhinitis due to pollen: Secondary | ICD-10-CM | POA: Diagnosis not present

## 2024-05-20 DIAGNOSIS — J3081 Allergic rhinitis due to animal (cat) (dog) hair and dander: Secondary | ICD-10-CM | POA: Diagnosis not present

## 2024-05-21 DIAGNOSIS — Z1231 Encounter for screening mammogram for malignant neoplasm of breast: Secondary | ICD-10-CM | POA: Diagnosis not present

## 2024-05-26 ENCOUNTER — Telehealth: Payer: Self-pay | Admitting: Physical Medicine and Rehabilitation

## 2024-05-26 DIAGNOSIS — M8588 Other specified disorders of bone density and structure, other site: Secondary | ICD-10-CM | POA: Diagnosis not present

## 2024-05-26 NOTE — Telephone Encounter (Signed)
 Patient called. Says injection did not work

## 2024-05-30 ENCOUNTER — Encounter: Payer: Self-pay | Admitting: Physical Medicine and Rehabilitation

## 2024-05-30 ENCOUNTER — Ambulatory Visit: Admitting: Physical Medicine and Rehabilitation

## 2024-05-30 DIAGNOSIS — M542 Cervicalgia: Secondary | ICD-10-CM | POA: Diagnosis not present

## 2024-05-30 DIAGNOSIS — M47812 Spondylosis without myelopathy or radiculopathy, cervical region: Secondary | ICD-10-CM | POA: Diagnosis not present

## 2024-05-30 DIAGNOSIS — M7918 Myalgia, other site: Secondary | ICD-10-CM | POA: Diagnosis not present

## 2024-05-30 NOTE — Progress Notes (Signed)
 Neck pain- referred by Genelle,  States injection did not work (05/08/24) Felt some relief for 2 days  Pain is same as before injection

## 2024-05-30 NOTE — Progress Notes (Signed)
 Brianna Gates - 73 y.o. female MRN [id number]  Date of birth: [birth year]  Office Visit Note: Visit Date: 05/30/2024 PCP: Janey Santos, MD Referred by: Janey Santos, MD  Subjective: No chief complaint on file.  HPI: Brianna Gates is a 73 y.o. female who comes in today for evaluation of chronic, worsening and severe left sided neck pain, intermittent radiation of pain to left shoulder. Pain ongoing for several years, worsens with movement and activity. Most severe pain with side to side rotation. She describes pain as sore and pressure sensation, currently rates as 4 out of 10. Some relief of pain with home exercise regimen, rest and use of medications. No recent dedicated physical therapy for her neck. Recent cervical MRI imaging shows mild for age multilevel cervical spondylosis without significant spinal stenosis or overt neural impingement. There is moderate left C6 foraminal narrowing. No high grade spinal canal stenosis. She underwent left C7-T1 interlaminar epidural steroid injection in our office on 05/08/2024. No relief of pain with this injection. Patient denies focal weakness, numbness and tingling. No recent trauma or falls.        Review of Systems  Musculoskeletal:  Positive for myalgias and neck pain.  Neurological:  Negative for tingling, sensory change, focal weakness and weakness.  All other systems reviewed and are negative.  Otherwise per HPI.  Assessment & Plan: Visit Diagnoses:    ICD-10-CM   1. Cervicalgia  M54.2 Ambulatory referral to Physical Therapy    2. Myofascial pain syndrome  M79.18 Ambulatory referral to Physical Therapy    3. Facet arthropathy, cervical  M47.812        Plan: Findings:  Chronic, worsening and severe left sided neck pain, intermittent radiation of pain to left shoulder. Patient continues to have severe pain despite good conservative therapies such as home exercise regimen, rest and use of medications. Patients  clinical presentation and exam are consistent with myofascial pain syndrome. There is myofascial tenderness noted to left cervical paraspinal region, palpable trigger points noted to left sternocleidomastoid region. I placed order for short course of formal physical therapy with a focus on manual treatments and dry needling. I will see her back as needed post physical therapy. I encouraged patient to remain active as tolerated. Should her pain persist would consider possible cervical facet joint injections. No red flag symptoms noted upon exam today.     Meds & Orders: No orders of the defined types were placed in this encounter.   Orders Placed This Encounter  Procedures  . Ambulatory referral to Physical Therapy    Follow-up: Return if symptoms worsen or fail to improve.   Procedures: No procedures performed      Clinical History: MRI CERVICAL SPINE WITHOUT CONTRAST   TECHNIQUE: Multiplanar, multisequence MR imaging of the cervical spine was performed. No intravenous contrast was administered.   COMPARISON:  Prior radiograph from 08/13/2023.   FINDINGS: Alignment: Trace scoliosis. Trace facet mediated anterolisthesis of C7 on T1 and T1 on T2. Alignment otherwise normal preservation of the normal cervical lordosis.   Vertebrae: Vertebral body height maintained without acute or chronic fracture. Bone marrow signal intensity within normal limits. No discrete or worrisome osseous lesions. No abnormal marrow edema.   Cord: Normal signal and morphology.   Posterior Fossa, vertebral arteries, paraspinal tissues: Visualized brain and posterior fossa within normal limits. Craniocervical junction normal. Paraspinous soft tissues within normal limits. Normal flow voids seen within the vertebral arteries bilaterally. 5 mm left thyroid  nodule noted, of doubtful  significance given size and patient age, no follow-up imaging recommended (ref: J Am Coll Radiol. 2015 Feb;12(2): 143-50).    Disc levels:   C2-C3: Unremarkable.   C3-C4: Diffuse disc bulge with bilateral uncovertebral spurring. No spinal stenosis. Mild bilateral C4 foraminal stenosis.   C4-C5: Disc bulge with uncovertebral spurring. Mild left-sided facet hypertrophy. No spinal stenosis. Mild bilateral C5 foraminal stenosis.   C5-C6: Disc bulge with uncovertebral spurring. Moderate right facet hypertrophy. No spinal stenosis. Moderate left C6 foraminal narrowing. Right neural foramen remains adequately patent.   C6-C7: Disc bulge with uncovertebral spurring. Mild to moderate bilateral facet hypertrophy. No spinal stenosis. Mild left C7 foraminal narrowing. Right neural foramina remains patent.   C7-T1: Negative interspace. Moderate facet hypertrophy. No canal or stenosis. Foramina remain adequately patent.   IMPRESSION: 1. Mild for age multilevel cervical spondylosis without significant spinal stenosis or overt neural impingement. 2. Multifactorial degenerative changes with resultant multilevel foraminal narrowing as above. Notable findings include mild bilateral C4 and C5 foraminal stenosis, moderate left C6 foraminal narrowing, with mild left C7 foraminal stenosis. 3. Mild-to-moderate multilevel facet hypertrophy as above. Finding could contribute to neck pain.     Electronically Signed   By: Morene Hoard M.D.   On: 04/13/2024 15:57   She reports that she has never smoked. She has never used smokeless tobacco.  Recent Labs    01/30/24 1324  HGBA1C 5.9*    Objective:  VS:  HT:    WT:   BMI:     BP:   HR: bpm  TEMP: ( )  RESP:  Physical Exam Vitals and nursing note reviewed.  HENT:     Head: Normocephalic and atraumatic.     Right Ear: External ear normal.     Left Ear: External ear normal.     Nose: Nose normal.     Mouth/Throat:     Mouth: Mucous membranes are moist.  Eyes:     Extraocular Movements: Extraocular movements intact.  Cardiovascular:     Rate and  Rhythm: Normal rate.     Pulses: Normal pulses.  Pulmonary:     Effort: Pulmonary effort is normal.  Abdominal:     General: Abdomen is flat. There is no distension.  Musculoskeletal:        General: Tenderness present.     Cervical back: Tenderness present.     Comments: Discomfort noted side-to-side rotation. Patient has good strength in the upper extremities including 5 out of 5 strength in wrist extension, long finger flexion and APB. Shoulder range of motion is full bilaterally without any sign of impingement. There is no atrophy of the hands intrinsically. Sensation intact bilaterally. Myofascial tenderness noted to left cervical paraspinal region. Palpable myofascial trigger point noted to left sternocleidomastoid region. Negative Hoffman's sign. Negative Spurling's sign.     Skin:    General: Skin is warm and dry.     Capillary Refill: Capillary refill takes less than 2 seconds.  Neurological:     General: No focal deficit present.     Mental Status: She is alert and oriented to person, place, and time.  Psychiatric:        Mood and Affect: Mood normal.        Behavior: Behavior normal.     Ortho Exam  Imaging: No results found.  Past Medical/Family/Surgical/Social History: Medications & Allergies reviewed per EMR, new medications updated. Patient Active Problem List   Diagnosis Date Noted  . Anxiety 01/30/2024  . Peripheral neuropathy 01/30/2024  .  Tendinitis of left rotator cuff 12/18/2023  . Arthritis of left acromioclavicular joint 12/18/2023  . CAD (coronary artery disease) 06/29/2023  . Paresthesia 10/18/2022  . Onychomycosis 01/29/2022  . Chest pain of uncertain etiology 12/13/2021  . Pure hypercholesterolemia 12/13/2021  . Palpitations 12/12/2021  . Esophageal dysphagia   . Allergic rhinitis 04/04/2021  . Allergic rhinitis due to animal (cat) (dog) hair and dander 04/04/2021  . Allergic rhinitis due to pollen 04/04/2021  . Hormone deficiency 04/04/2021  .  Syncope and collapse 12/27/2020  . Hypotension 12/27/2020  . Bradycardia 12/27/2020  . History of COVID-19 12/27/2020  . Preoperative evaluation to rule out surgical contraindication 07/16/2020  . Arthrofibrosis of knee joint, right 11/26/2019  . Arthritis of knee 07/30/2019  . Presence of right artificial knee joint 07/30/2019  . Sleep apnea 10/16/2018  . Shoulder impingement syndrome 01/15/2018  . Gastroesophageal reflux disease 02/02/2016  . Sensorineural hearing loss (SNHL), bilateral 02/02/2016  . Vaginal vault prolapse after hysterectomy 07/28/2015  . History of Guillain-Barre syndrome 07/22/2014  . Cervical strain 07/22/2014  . Postoperative stiffness of total knee replacement (HCC) 08/27/2013  . Abnormality of gait 08/22/2012  . Cervical spondylosis without myelopathy 08/22/2012  . Muscle weakness (generalized) 08/22/2012  . Guillain-Barre syndrome (HCC) 08/22/2012  . Essential hypertension 08/22/2012  . Strabismus 08/22/2012  . Degenerative joint disease of knee, left 08/22/2012  . Hypothyroidism 08/22/2012  . Raynaud's disease 08/22/2012  . Knee joint replacement by other means 08/22/2012   Past Medical History:  Diagnosis Date  . Allergy   . Anxiety   . Asthma    INFREQUENT PROBLEM - rarely used inhaler  . CAD (coronary artery disease) 06/29/2023   CCTA 06/27/23: CAC score 15.7 (45th percentile), mild nonobstructive CAD [mid LAD 1-24, proximal LCx 1-24]   . Cervical spondylosis   . Cervical strain 07/22/2014  . Chest pain of uncertain etiology 12/13/2021  . Degenerative arthritis   . Dyslipidemia   . Eczema   . GERD (gastroesophageal reflux disease)   . Guillain-Barre syndrome (HCC)   . History of Guillain-Barre syndrome 07/22/2014  . Hyperlipidemia   . Hypertension   . Hypothyroidism   . Lumbosacral spondylosis   . Palpitations 12/12/2021  . Pure hypercholesterolemia 12/13/2021  . Raynaud disease   . Thyroid  nodule    Family History  Problem Relation  Age of Onset  . Hypertension Mother   . Other Mother        Dyslipidemia  . Stroke Mother   . Heart attack Father        72s  . Heart disease Father   . Hemochromatosis Father   . Hemochromatosis Brother   . Hyperlipidemia Brother   . Hypertension Brother   . Other Brother        Dyslipidemia  . Stomach cancer Paternal Uncle   . Colon cancer Neg Hx   . Rectal cancer Neg Hx   . Esophageal cancer Neg Hx    Past Surgical History:  Procedure Laterality Date  . ABDOMINAL HYSTERECTOMY    . COLONOSCOPY    . ESOPHAGOGASTRODUODENOSCOPY (EGD) WITH PROPOFOL  N/A 09/13/2021   Procedure: ESOPHAGOGASTRODUODENOSCOPY (EGD) WITH PROPOFOL ;  Surgeon: Abran Norleen SAILOR, MD;  Location: [facility];  Service: Endoscopy;  Laterality: N/A;  . FOOT SURGERY Left   . GANGLION CYST EXCISION Left    Wrist  . HERNIA REPAIR    . KNEE ARTHROSCOPY     RT KNEE  . KNEE ARTHROTOMY Right 08/27/2013   Procedure: RIGHT KNEE SCAR  EXCISION AND FEMORAL REVISION;  Surgeon: Dempsey LULLA Moan, MD;  Location: [facility];  Service: Orthopedics;  Laterality: Right;  . REPLACEMENT TOTAL KNEE Right    2014, 2021  . REPLACEMENT TOTAL KNEE BILATERAL Bilateral   . SHOULDER ARTHROSCOPY WITH DISTAL CLAVICLE RESECTION Left 12/18/2023   Procedure: LEFT SHOULDER ARTHROSCOPY WITH EXTENSIVE DEBRIDEMENT, DISTAL CLAVICLE RESECTION / ACROMIOPLASTY;  Surgeon: Genelle Standing, MD;  Location: [facility];  Service: Orthopedics;  Laterality: Left;  . STRABISMUS SURGERY Right   . TONSILLECTOMY    . vaginal vulvo prolapse  09/16/2015   Social History   Occupational History    Employer: UNEMPLOYED  Tobacco Use  . Smoking status: Never  . Smokeless tobacco: Never  Vaping Use  . Vaping status: Never Used  Substance and Sexual Activity  . Alcohol  use: Not Currently    Comment: stopped drinking  . Drug use: No  . Sexual activity: Yes    Birth control/protection: Other-see comments, Post-menopausal    Comment: Hysterectomy

## 2024-06-03 DIAGNOSIS — F411 Generalized anxiety disorder: Secondary | ICD-10-CM | POA: Diagnosis not present

## 2024-06-06 DIAGNOSIS — J3081 Allergic rhinitis due to animal (cat) (dog) hair and dander: Secondary | ICD-10-CM | POA: Diagnosis not present

## 2024-06-06 DIAGNOSIS — J3089 Other allergic rhinitis: Secondary | ICD-10-CM | POA: Diagnosis not present

## 2024-06-06 DIAGNOSIS — J301 Allergic rhinitis due to pollen: Secondary | ICD-10-CM | POA: Diagnosis not present

## 2024-06-11 ENCOUNTER — Other Ambulatory Visit (HOSPITAL_BASED_OUTPATIENT_CLINIC_OR_DEPARTMENT_OTHER): Payer: Self-pay

## 2024-06-11 ENCOUNTER — Encounter (HOSPITAL_BASED_OUTPATIENT_CLINIC_OR_DEPARTMENT_OTHER): Admitting: Orthopaedic Surgery

## 2024-06-11 ENCOUNTER — Ambulatory Visit (HOSPITAL_BASED_OUTPATIENT_CLINIC_OR_DEPARTMENT_OTHER): Admitting: Orthopaedic Surgery

## 2024-06-11 DIAGNOSIS — F900 Attention-deficit hyperactivity disorder, predominantly inattentive type: Secondary | ICD-10-CM | POA: Diagnosis not present

## 2024-06-11 DIAGNOSIS — M25512 Pain in left shoulder: Secondary | ICD-10-CM

## 2024-06-11 DIAGNOSIS — M7918 Myalgia, other site: Secondary | ICD-10-CM | POA: Diagnosis not present

## 2024-06-11 DIAGNOSIS — F330 Major depressive disorder, recurrent, mild: Secondary | ICD-10-CM | POA: Diagnosis not present

## 2024-06-11 MED ORDER — LIDOCAINE HCL 1 % IJ SOLN
4.0000 mL | INTRAMUSCULAR | Status: AC | PRN
  Administered 2024-06-11: 4 mL

## 2024-06-11 MED ORDER — TRIAMCINOLONE ACETONIDE 40 MG/ML IJ SUSP
80.0000 mg | INTRAMUSCULAR | Status: AC | PRN
  Administered 2024-06-11: 80 mg via INTRA_ARTICULAR

## 2024-06-11 NOTE — Progress Notes (Signed)
 Chief Complaint: Left shoulder acromioplasty with distal clavicectomy 12/18/23     History of Present Illness:   06/11/2024: Presents today predominantly with the trigger point about the left latissimus muscle.  This has been bothering her as well as her continued cervical pain.  She did not get any relief from her cervical epidural injection  PMH/PSH/Family History/Social History/Meds/Allergies:    Past Medical History:  Diagnosis Date  . Allergy   . Anxiety   . Asthma    INFREQUENT PROBLEM - rarely used inhaler  . CAD (coronary artery disease) 06/29/2023   CCTA 06/27/23: CAC score 15.7 (45th percentile), mild nonobstructive CAD [mid LAD 1-24, proximal LCx 1-24]   . Cervical spondylosis   . Cervical strain 07/22/2014  . Chest pain of uncertain etiology 12/13/2021  . Degenerative arthritis   . Dyslipidemia   . Eczema   . GERD (gastroesophageal reflux disease)   . Guillain-Barre syndrome (HCC)   . History of Guillain-Barre syndrome 07/22/2014  . Hyperlipidemia   . Hypertension   . Hypothyroidism   . Lumbosacral spondylosis   . Palpitations 12/12/2021  . Pure hypercholesterolemia 12/13/2021  . Raynaud disease   . Thyroid  nodule    Past Surgical History:  Procedure Laterality Date  . ABDOMINAL HYSTERECTOMY    . COLONOSCOPY    . ESOPHAGOGASTRODUODENOSCOPY (EGD) WITH PROPOFOL  N/A 09/13/2021   Procedure: ESOPHAGOGASTRODUODENOSCOPY (EGD) WITH PROPOFOL ;  Surgeon: Abran Norleen SAILOR, MD;  Location: [facility];  Service: Endoscopy;  Laterality: N/A;  . FOOT SURGERY Left   . GANGLION CYST EXCISION Left    Wrist  . HERNIA REPAIR    . KNEE ARTHROSCOPY     RT KNEE  . KNEE ARTHROTOMY Right 08/27/2013   Procedure: RIGHT KNEE SCAR EXCISION AND FEMORAL REVISION;  Surgeon: Dempsey LULLA Moan, MD;  Location: [facility];  Service: Orthopedics;  Laterality: Right;  . REPLACEMENT TOTAL KNEE Right    2014, 2021  . REPLACEMENT TOTAL KNEE BILATERAL Bilateral   . SHOULDER ARTHROSCOPY WITH DISTAL  CLAVICLE RESECTION Left 12/18/2023   Procedure: LEFT SHOULDER ARTHROSCOPY WITH EXTENSIVE DEBRIDEMENT, DISTAL CLAVICLE RESECTION / ACROMIOPLASTY;  Surgeon: Genelle Standing, MD;  Location: [facility];  Service: Orthopedics;  Laterality: Left;  . STRABISMUS SURGERY Right   . TONSILLECTOMY    . vaginal vulvo prolapse  09/16/2015   Social History   Socioeconomic History  . Marital status: Married    Spouse name: Not on file  . Number of children: 2  . Years of education: college  . Highest education level: Not on file  Occupational History    Employer: UNEMPLOYED  Tobacco Use  . Smoking status: Never  . Smokeless tobacco: Never  Vaping Use  . Vaping status: Never Used  Substance and Sexual Activity  . Alcohol  use: Not Currently    Comment: stopped drinking  . Drug use: No  . Sexual activity: Yes    Birth control/protection: Other-see comments, Post-menopausal    Comment: Hysterectomy  Other Topics Concern  . Not on file  Social History Narrative   Lives at home, married   Patient is right handed.   Patient drinks 2-3 cups caffeine daily.   Social Drivers of Corporate investment banker Strain: Not on file  Food Insecurity: Not on file  Transportation Needs: Not on file  Physical Activity: Not on file  Stress: Not on file  Social Connections: Not on file   Family History  Problem Relation Age of Onset  . Hypertension Mother   .  Other Mother        Dyslipidemia  . Stroke Mother   . Heart attack Father        63s  . Heart disease Father   . Hemochromatosis Father   . Hemochromatosis Brother   . Hyperlipidemia Brother   . Hypertension Brother   . Other Brother        Dyslipidemia  . Stomach cancer Paternal Uncle   . Colon cancer Neg Hx   . Rectal cancer Neg Hx   . Esophageal cancer Neg Hx    Allergies  Allergen Reactions  . Captopril Other (See Comments)    reaction  . Oxaprozin Hives and Other (See Comments)  . Ramipril Cough and Other (See  Comments)  . Zoloft [Sertraline Hcl] Diarrhea  . Ace Inhibitors     Other reaction(s): Other (See Comments) GB syndrome  . Doxycycline Other (See Comments)    Burning sensation/Rash  . Influenza Vac Split Quad     Other Reaction(s): Guillain-Barr syndrome  . Influenza Vaccines     HX   . Sulfa Antibiotics Other (See Comments)    unknown   Current Outpatient Medications  Medication Sig Dispense Refill  . acetaminophen  (TYLENOL ) 650 MG CR tablet Take 1,300 mg by mouth every 8 (eight) hours as needed for pain or fever.    . albuterol  (PROVENTIL  HFA;VENTOLIN  HFA) 108 (90 BASE) MCG/ACT inhaler Inhale 2 puffs into the lungs every 6 (six) hours as needed for wheezing or shortness of breath.    . amLODipine  (NORVASC ) 2.5 MG tablet Take 1 tablet (2.5 mg total) by mouth every evening. 90 tablet 3  . amoxicillin (AMOXIL) 500 MG capsule Take 2,000 mg by mouth once as needed (prior to dental appointments).    . Ascorbic Acid (VITAMIN C PO) Take 1 tablet by mouth as needed (sick symptoms).    . aspirin  EC 325 MG tablet Take 1 tablet (325 mg total) by mouth daily. 14 tablet 0  . aspirin  EC 81 MG tablet Take 81 mg by mouth daily. Swallow whole.    SABRA azelastine (ASTELIN) 0.1 % nasal spray Place 1 spray into both nostrils 2 (two) times daily as needed (cold symptoms).    . busPIRone (BUSPAR) 30 MG tablet Take 30 mg by mouth 2 (two) times daily.    . Cholecalciferol (VITAMIN D ) 2000 UNITS CAPS Take 2,000 Units by mouth daily in the afternoon.    SABRA COVID-19 mRNA vaccine 2023-2024 (COMIRNATY ) SUSP injection Inject into the muscle. 0.3 mL 0  . COVID-19 mRNA vaccine, Pfizer, (COMIRNATY ) syringe Inject into the muscle. 0.3 mL 0  . DULoxetine  (CYMBALTA ) 30 MG capsule Take 1 capsule (30 mg total) by mouth daily. 30 capsule 11  . Emollient (CETAPHIL) cream Apply 1 application topically daily.    . EPINEPHrine  0.3 mg/0.3 mL IJ SOAJ injection Inject 0.3 mg into the muscle once as needed for anaphylaxis.    .  estradiol  (ESTRACE ) 0.1 MG/GM vaginal cream Place 1 Applicatorful vaginally 2 (two) times a week.    . fexofenadine-pseudoephedrine (ALLEGRA-D 24) 180-240 MG 24 hr tablet Take 1 tablet by mouth daily.    . gabapentin  (NEURONTIN ) 400 MG capsule Take 1 capsule (400 mg total) by mouth 3 (three) times daily. 90 capsule 11  . ibuprofen (ADVIL,MOTRIN) 100 MG tablet Take 400 mg by mouth daily at 12 noon. May take in the pm additionally as needed    . ketoconazole  (NIZORAL ) 2 % cream Apply 1 application topically daily. (Patient taking  differently: Apply 1 application  topically as needed.) 60 g 0  . levothyroxine  (SYNTHROID ) 50 MCG tablet Take 50 mcg by mouth daily before breakfast.     . losartan  (COZAAR ) 50 MG tablet Take 1 tablet (50 mg total) by mouth in the morning. 90 tablet 3  . montelukast  (SINGULAIR ) 10 MG tablet Take 10 mg by mouth at bedtime.    . oxyCODONE  (ROXICODONE ) 5 MG immediate release tablet Take 1 tablet (5 mg total) by mouth every 4 (four) hours as needed for severe pain (pain score 7-10) or breakthrough pain. 30 tablet 0  . pantoprazole  (PROTONIX ) 40 MG tablet Take 1 tablet (40 mg total) by mouth 2 (two) times daily. Office visit for further refills (Patient taking differently: Take 40 mg by mouth as needed. Office visit for further refills) 180 tablet 0  . rosuvastatin  (CRESTOR ) 10 MG tablet Take 1 tablet (10 mg total) by mouth every other day. 90 tablet 3  . Simethicone (PHAZYME PO) Take 250 mg by mouth daily as needed (Gas).    . triamcinolone  cream (KENALOG ) 0.1 % 1 application as needed (itching bumps on stomach). Mixed with Cetaphil    . VEVYE 0.1 % SOLN Apply 1 drop to eye 2 (two) times daily.     No current facility-administered medications for this visit.   No results found.  Review of Systems:   A ROS was performed including pertinent positives and negatives as documented in the HPI.  Physical Exam :   Constitutional: NAD and appears stated age Neurological: Alert and  oriented Psych: Appropriate affect and cooperative There were no vitals taken for this visit.   Comprehensive Musculoskeletal Exam:    Left shoulder incisions are well-appearing without erythema or drainage.  Active forward elevation is 160 compared to 160 on the contralateral side.  External rotation is to 30 degrees compared to 50 on the contralateral side.  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   Assessment and Plan:   73 y.o. female status post acromioplasty and arthroscopic debridement.  Today's visit she is having a trigger point around her left latissimus muscle.  I did recommend an ultrasound-guided injection of the trigger point to help with pain relief.  She is still having posterior cervical pain.  I do believe she may ultimately be a candidate for Botox injections in this area to help with her pain that does radiate to the upper neck.  Plan for referral for that   - Left posterior shoulder trigger point injection provided after verbal consent obtained    Procedure Note  Patient: Brianna Gates             Date of Birth: January 30, 1951           MRN: [id number]             Visit Date: 06/11/2024  Procedures: Visit Diagnoses:  1. Myofascial pain syndrome     Large Joint Inj on 06/11/2024 12:34 PM Indications: pain Details: 22 G 1.5 in needle, ultrasound-guided posterior approach  Arthrogram: No  Medications: 4 mL lidocaine  1 %; 80 mg triamcinolone  acetonide 40 MG/ML Outcome: tolerated well, no immediate complications Procedure, treatment alternatives, risks and benefits explained, specific risks discussed. Consent was given by the patient. Immediately prior to procedure a time out was called to verify the correct patient, procedure, equipment,  support staff and site/side marked as required.  Patient was prepped and draped in the usual sterile fashion.         I personally saw and evaluated the patient, and participated in the management and treatment plan.  Elspeth Parker, MD Attending Physician, Orthopedic Surgery  This document was dictated using Dragon voice recognition software. A reasonable attempt at proof reading has been made to minimize errors.

## 2024-06-12 DIAGNOSIS — J301 Allergic rhinitis due to pollen: Secondary | ICD-10-CM | POA: Diagnosis not present

## 2024-06-12 DIAGNOSIS — J3081 Allergic rhinitis due to animal (cat) (dog) hair and dander: Secondary | ICD-10-CM | POA: Diagnosis not present

## 2024-06-12 DIAGNOSIS — J3089 Other allergic rhinitis: Secondary | ICD-10-CM | POA: Diagnosis not present

## 2024-06-24 ENCOUNTER — Ambulatory Visit: Admitting: Podiatry

## 2024-06-26 DIAGNOSIS — J3081 Allergic rhinitis due to animal (cat) (dog) hair and dander: Secondary | ICD-10-CM | POA: Diagnosis not present

## 2024-06-26 DIAGNOSIS — J3089 Other allergic rhinitis: Secondary | ICD-10-CM | POA: Diagnosis not present

## 2024-06-30 ENCOUNTER — Ambulatory Visit: Admitting: Podiatry

## 2024-06-30 DIAGNOSIS — B351 Tinea unguium: Secondary | ICD-10-CM | POA: Diagnosis not present

## 2024-06-30 DIAGNOSIS — M79675 Pain in left toe(s): Secondary | ICD-10-CM

## 2024-06-30 DIAGNOSIS — M79674 Pain in right toe(s): Secondary | ICD-10-CM | POA: Diagnosis not present

## 2024-07-01 ENCOUNTER — Encounter: Payer: Self-pay | Admitting: Physical Medicine and Rehabilitation

## 2024-07-01 NOTE — Progress Notes (Signed)
 Subjective: Chief Complaint  Patient presents with   Nail Problem    Pt stated that she is here to have her nails trimmed down     73 year old female presents the office today for follow-up evaluation of ingrown toenail on both big toes.  States that they are causing discomfort but denies any swelling or redness or any drainage.  No open lesions.  No other concerns.    Objective: AAO x3, NAD DP/PT pulses palpable bilaterally, CRT less than 3 seconds Ingrown toenails noted along the distal aspect of bilateral hallux toenails on both medial, lateral nail borders.  There is also buildup of dry skin on the nail corners.  Is no surrounding erythema, ascending size but is no drainage or pus.  No signs of infection. No pain with calf compression, swelling, warmth, erythema  Assessment: Ingrown toenail  Plan: -All treatment options discussed with the patient including all alternatives, risks, complications.  -I did sharply debride the corners of the ingrown toenail without any complications or bleeding today x 2.  As a courtesy debride the nails without any complications or bleeding.  Return in about 9 years (around 06/30/2033) for nail trim, ingrown toenail.   Donnice JONELLE Fees DPM

## 2024-07-09 DIAGNOSIS — E039 Hypothyroidism, unspecified: Secondary | ICD-10-CM | POA: Diagnosis not present

## 2024-07-09 DIAGNOSIS — I10 Essential (primary) hypertension: Secondary | ICD-10-CM | POA: Diagnosis not present

## 2024-07-09 DIAGNOSIS — F419 Anxiety disorder, unspecified: Secondary | ICD-10-CM | POA: Diagnosis not present

## 2024-07-14 DIAGNOSIS — F900 Attention-deficit hyperactivity disorder, predominantly inattentive type: Secondary | ICD-10-CM | POA: Diagnosis not present

## 2024-07-14 DIAGNOSIS — F330 Major depressive disorder, recurrent, mild: Secondary | ICD-10-CM | POA: Diagnosis not present

## 2024-07-17 DIAGNOSIS — J3089 Other allergic rhinitis: Secondary | ICD-10-CM | POA: Diagnosis not present

## 2024-07-17 DIAGNOSIS — J301 Allergic rhinitis due to pollen: Secondary | ICD-10-CM | POA: Diagnosis not present

## 2024-07-17 DIAGNOSIS — J3081 Allergic rhinitis due to animal (cat) (dog) hair and dander: Secondary | ICD-10-CM | POA: Diagnosis not present

## 2024-07-21 DIAGNOSIS — H25811 Combined forms of age-related cataract, right eye: Secondary | ICD-10-CM | POA: Diagnosis not present

## 2024-07-24 ENCOUNTER — Other Ambulatory Visit (HOSPITAL_BASED_OUTPATIENT_CLINIC_OR_DEPARTMENT_OTHER): Payer: Self-pay

## 2024-07-24 MED ORDER — COMIRNATY 30 MCG/0.3ML IM SUSY
0.3000 mL | PREFILLED_SYRINGE | Freq: Once | INTRAMUSCULAR | 0 refills | Status: AC
Start: 2024-07-24 — End: 2024-07-25
  Filled 2024-07-24: qty 0.3, 1d supply, fill #0

## 2024-07-29 DIAGNOSIS — J3089 Other allergic rhinitis: Secondary | ICD-10-CM | POA: Diagnosis not present

## 2024-07-29 DIAGNOSIS — J3081 Allergic rhinitis due to animal (cat) (dog) hair and dander: Secondary | ICD-10-CM | POA: Diagnosis not present

## 2024-07-29 DIAGNOSIS — J301 Allergic rhinitis due to pollen: Secondary | ICD-10-CM | POA: Diagnosis not present

## 2024-08-12 DIAGNOSIS — H268 Other specified cataract: Secondary | ICD-10-CM | POA: Diagnosis not present

## 2024-08-12 DIAGNOSIS — H25811 Combined forms of age-related cataract, right eye: Secondary | ICD-10-CM | POA: Diagnosis not present

## 2024-08-18 ENCOUNTER — Encounter: Payer: Self-pay | Admitting: Radiology

## 2024-08-19 DIAGNOSIS — F330 Major depressive disorder, recurrent, mild: Secondary | ICD-10-CM | POA: Diagnosis not present

## 2024-08-19 DIAGNOSIS — F900 Attention-deficit hyperactivity disorder, predominantly inattentive type: Secondary | ICD-10-CM | POA: Diagnosis not present

## 2024-08-20 DIAGNOSIS — J3089 Other allergic rhinitis: Secondary | ICD-10-CM | POA: Diagnosis not present

## 2024-08-20 DIAGNOSIS — J301 Allergic rhinitis due to pollen: Secondary | ICD-10-CM | POA: Diagnosis not present

## 2024-08-20 DIAGNOSIS — J3081 Allergic rhinitis due to animal (cat) (dog) hair and dander: Secondary | ICD-10-CM | POA: Diagnosis not present

## 2024-08-22 ENCOUNTER — Encounter: Attending: Physical Medicine and Rehabilitation | Admitting: Physical Medicine and Rehabilitation

## 2024-08-22 ENCOUNTER — Encounter: Payer: Self-pay | Admitting: Physical Medicine and Rehabilitation

## 2024-08-22 VITALS — BP 109/65 | HR 68 | Ht 65.0 in | Wt 121.2 lb

## 2024-08-22 DIAGNOSIS — I69398 Other sequelae of cerebral infarction: Secondary | ICD-10-CM | POA: Diagnosis not present

## 2024-08-22 DIAGNOSIS — R2689 Other abnormalities of gait and mobility: Secondary | ICD-10-CM | POA: Insufficient documentation

## 2024-08-22 DIAGNOSIS — M7918 Myalgia, other site: Secondary | ICD-10-CM | POA: Diagnosis not present

## 2024-08-22 DIAGNOSIS — M5481 Occipital neuralgia: Secondary | ICD-10-CM | POA: Diagnosis not present

## 2024-08-22 NOTE — Patient Instructions (Signed)
 Foods that may reduce pain: 1) Ginger (especially studied for arthritis)- reduce leukotriene production to decrease inflammation 2) Blueberries- high in phytonutrients that decrease inflammation 3) Salmon- marine omega-3s reduce joint swelling and pain 4) Pumpkin seeds- reduce inflammation 5) dark chocolate- reduces inflammation 6) turmeric- reduces inflammation 7) tart cherries - reduce pain and stiffness 8) extra virgin olive oil - its compound olecanthal helps to block prostaglandins  9) chili peppers- can be eaten or applied topically via capsaicin 10) mint- helpful for headache, muscle aches, joint pain, and itching 11) garlic- reduces inflammation 12) Green tea- reduces inflammation and oxidative stress, helps with weight loss, may reduce the risk of cancer, recommend Double Green Matcha [place] of Tea daily  Link to further information on diet for chronic pain: [email or web address]

## 2024-08-22 NOTE — Progress Notes (Signed)
 Subjective:    Patient ID: Brianna Gates, female    DOB: 1950-11-24, 73 y.o.   MRN: [id number]  HPI 1) Cervical myofascial pain: -she has had dry needling, PT, injections -hurts more when she does exercise, especially shoulder press -Dr. Genelle recommended Botox to her  2) Occipital neuralgia: -present when she has neck pain  Pain Inventory Average Pain 6 Pain Right Now 6 My pain is burning and aching  In the last 24 hours, has pain interfered with the following? General activity 5 Relation with others 4 Enjoyment of life 4 What TIME of day is your pain at its worst? morning , daytime, and evening Sleep (in general) Fair  Pain is worse with: bending, sitting, and some activites Pain improves with: not much Relief from Meds: 4  walk without assistance how many minutes can you walk? 20 or more ability to climb steps?  yes do you drive?  yes  retired  tingling trouble walking anxiety  Any changes since last visit?  yes  Primary care Searcy Overcast, MD Rheumatologist Dr. Mai, MD    Family History  Problem Relation Age of Onset   Hypertension Mother    Other Mother        Dyslipidemia   Stroke Mother    Heart attack Father        44s   Heart disease Father    Hemochromatosis Father    Hemochromatosis Brother    Hyperlipidemia Brother    Hypertension Brother    Other Brother        Dyslipidemia   Stomach cancer Paternal Uncle    Colon cancer Neg Hx    Rectal cancer Neg Hx    Esophageal cancer Neg Hx    Social History   Socioeconomic History   Marital status: Married    Spouse name: Not on file   Number of children: 2   Years of education: college   Highest education level: Not on file  Occupational History    Employer: UNEMPLOYED  Tobacco Use   Smoking status: Never   Smokeless tobacco: Never  Vaping Use   Vaping status: Never Used  Substance and Sexual Activity   Alcohol  use: Not Currently    Comment: stopped drinking    Drug use: No   Sexual activity: Yes    Birth control/protection: Other-see comments, Post-menopausal    Comment: Hysterectomy  Other Topics Concern   Not on file  Social History Narrative   Lives at home, married   Patient is right handed.   Patient drinks 2-3 cups caffeine daily.   Social Drivers of Corporate Investment Banker Strain: Not on file  Food Insecurity: Not on file  Transportation Needs: Not on file  Physical Activity: Not on file  Stress: Not on file  Social Connections: Not on file   Past Surgical History:  Procedure Laterality Date   ABDOMINAL HYSTERECTOMY     COLONOSCOPY     ESOPHAGOGASTRODUODENOSCOPY (EGD) WITH PROPOFOL  N/A 09/13/2021   Procedure: ESOPHAGOGASTRODUODENOSCOPY (EGD) WITH PROPOFOL ;  Surgeon: Abran Norleen SAILOR, MD;  Location: [facility];  Service: Endoscopy;  Laterality: N/A;   FOOT SURGERY Left    GANGLION CYST EXCISION Left    Wrist   HERNIA REPAIR     KNEE ARTHROSCOPY     RT KNEE   KNEE ARTHROTOMY Right 08/27/2013   Procedure: RIGHT KNEE SCAR EXCISION AND FEMORAL REVISION;  Surgeon: Dempsey LULLA Moan, MD;  Location: [facility];  Service: Orthopedics;  Laterality:  Right;   REPLACEMENT TOTAL KNEE Right    2014, 2021   REPLACEMENT TOTAL KNEE BILATERAL Bilateral    SHOULDER ARTHROSCOPY WITH DISTAL CLAVICLE RESECTION Left 12/18/2023   Procedure: LEFT SHOULDER ARTHROSCOPY WITH EXTENSIVE DEBRIDEMENT, DISTAL CLAVICLE RESECTION / ACROMIOPLASTY;  Surgeon: Genelle Standing, MD;  Location: [facility];  Service: Orthopedics;  Laterality: Left;   STRABISMUS SURGERY Right    TONSILLECTOMY     vaginal vulvo prolapse  09/16/2015   Past Medical History:  Diagnosis Date   Allergy    Anxiety    Asthma    INFREQUENT PROBLEM - rarely used inhaler   CAD (coronary artery disease) 06/29/2023   CCTA 06/27/23: CAC score 15.7 (45th percentile), mild nonobstructive CAD [mid LAD 1-24, proximal LCx 1-24]    Cervical spondylosis    Cervical strain 07/22/2014    Chest pain of uncertain etiology 12/13/2021   Degenerative arthritis    Dyslipidemia    Eczema    GERD (gastroesophageal reflux disease)    Guillain-Barre syndrome    History of Guillain-Barre syndrome 07/22/2014   Hyperlipidemia    Hypertension    Hypothyroidism    Lumbosacral spondylosis    Palpitations 12/12/2021   Pure hypercholesterolemia 12/13/2021   Raynaud disease    Thyroid  nodule    BP 109/65 (BP Location: Left Arm, Patient Position: Sitting, Cuff Size: Normal)   Pulse 68   Ht 5' 5 (1.651 m)   Wt 121 lb 3.2 oz (55 kg)   SpO2 96%   BMI 20.17 kg/m   Opioid Risk Score:   Fall Risk Score:  `1  Depression screen [facility] 2/9     08/22/2024    9:50 AM  Depression screen PHQ 2/9  Decreased Interest 0  Down, Depressed, Hopeless 0  PHQ - 2 Score 0  Altered sleeping 1  Tired, decreased energy 1  Change in appetite 0  Feeling bad or failure about yourself  0  Trouble concentrating 0  Moving slowly or fidgety/restless 0  Suicidal thoughts 0  PHQ-9 Score 2  Difficult doing work/chores Not difficult at all      Review of Systems  Cardiovascular:        Hypertension, elevated cholesterol  Endocrine: Positive for cold intolerance.       Thyroid  nodules, Raynauds Syndrome  Musculoskeletal:  Positive for gait problem, myalgias and neck pain.       Stiff and sore neck pain  Psychiatric/Behavioral:         Anxiety  All other systems reviewed and are negative.      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  -Discussed benefits of exercise in reducing pain. -Discussed following foods that may reduce pain: 1) Ginger (especially studied for arthritis)- reduce leukotriene production to decrease inflammation 2) Blueberries- high in phytonutrients that decrease inflammation 3) Salmon- marine omega-3s reduce joint swelling and pain 4) Pumpkin seeds- reduce inflammation 5) dark chocolate- reduces inflammation 6) turmeric- reduces inflammation 7) tart cherries - reduce pain and stiffness 8) extra virgin olive oil - its compound  olecanthal helps to block prostaglandins  9) chili peppers- can be eaten or applied topically via capsaicin 10) mint- helpful for headache, muscle aches, joint pain, and itching 11) garlic- reduces inflammation 12) Green tea- reduces inflammation and oxidative stress, helps with weight loss, may reduce the risk of cancer, recommend Double Green Matcha [place] of Tea daily  Link to further information on diet for chronic pain: [email or web address]    2) 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 Discussed that this is a capsaicin patch, stronger than capsaicin cream. Discussed that it is currently approved for diabetic peripheral neuropathy and post-herpetic neuralgia, but that it has also shown benefit in treating other forms of neuropathy. Provided patient with link to site to learn more about the patch: https://www.clark.biz/. Discussed that the patch would be placed in office and benefits usually last 3 months. Discussed that unintended exposure to capsaicin can cause severe irritation of eyes, mucous membranes, respiratory tract, and skin, but that Qutenza is a local treatment and does not have the systemic side effects of other nerve medications. Discussed that there may be pain, itching, erythema, and  decreased sensory function associated with the application of Qutenza. Side effects usually subside within 1 week. A cold pack of analgesic medications can help with these side effects. Blood pressure can also be increased due to pain associated with administration of the patch.  We are recommending Qutenza 8% capsaicin to treat this patient's pain. Qutenza is the first-line treatment option recommended for diabetic peripheral neuropathy by the AACE and ADA.   Qutenza is a safer option for this patient due to the following: Gabapentin  use has been associated with increased risk of dementia Lyrica use has been associated with increased risk of heart failure Cymblata, Venlafaxine, and other SSRIs/SNRIs are associated with increased weight gain Lidocaine  5% has been prescribed/tried   3) Impaired balance: -discussed aquatherapy

## 2024-08-27 DIAGNOSIS — L821 Other seborrheic keratosis: Secondary | ICD-10-CM | POA: Diagnosis not present

## 2024-08-27 DIAGNOSIS — D485 Neoplasm of uncertain behavior of skin: Secondary | ICD-10-CM | POA: Diagnosis not present

## 2024-08-27 DIAGNOSIS — L578 Other skin changes due to chronic exposure to nonionizing radiation: Secondary | ICD-10-CM | POA: Diagnosis not present

## 2024-08-27 DIAGNOSIS — B079 Viral wart, unspecified: Secondary | ICD-10-CM | POA: Diagnosis not present

## 2024-08-27 DIAGNOSIS — C44622 Squamous cell carcinoma of skin of right upper limb, including shoulder: Secondary | ICD-10-CM | POA: Diagnosis not present

## 2024-08-27 DIAGNOSIS — L570 Actinic keratosis: Secondary | ICD-10-CM | POA: Diagnosis not present

## 2024-08-29 DIAGNOSIS — Z1212 Encounter for screening for malignant neoplasm of rectum: Secondary | ICD-10-CM | POA: Diagnosis not present

## 2024-08-29 DIAGNOSIS — E7849 Other hyperlipidemia: Secondary | ICD-10-CM | POA: Diagnosis not present

## 2024-09-01 ENCOUNTER — Ambulatory Visit: Admitting: Podiatry

## 2024-09-01 VITALS — Ht 65.0 in | Wt 121.2 lb

## 2024-09-01 DIAGNOSIS — L853 Xerosis cutis: Secondary | ICD-10-CM

## 2024-09-01 DIAGNOSIS — L600 Ingrowing nail: Secondary | ICD-10-CM | POA: Diagnosis not present

## 2024-09-01 NOTE — Progress Notes (Signed)
 Subjective: Chief Complaint  Patient presents with   Nail Problem    RM 11 Patient is here for nail trim, ingrown toenail.    73 year old female presents the office today for follow-up evaluation of ingrown toenail on both big toes.  States that they are causing discomfort but denies any swelling or redness or any drainage.  She does get dry skin to the feet as well. No open wounds. No other concerns.    Objective: AAO x3, NAD DP/PT pulses palpable bilaterally, CRT less than 3 seconds Ingrown toenails noted along the distal aspect of bilateral hallux toenails on both medial, lateral nail borders.  Also on the left fourth lateral nail borders ingrowing with a nail with some hyperkeratotic tissue on the lateral nail border.  There is also buildup of dry skin on the nail corners.  Is no surrounding erythema, ascending size but is no drainage or pus.  Dry skin present to the plantar aspect of the feet with any skin breakdown.  No signs of infection. No pain with calf compression, swelling, warmth, erythema  Assessment: Ingrown toenail  Plan: -All treatment options discussed with the patient including all alternatives, risks, complications.  -I did sharply debride the corners of the ingrown toenail without any complications or bleeding today x 3.  As a courtesy debride the nails without any complications or bleeding. - Discussed urea-based creams with the dry skin.  Return in about 2 months (around 11/02/2024).   Brianna Gates DPM

## 2024-09-01 NOTE — Patient Instructions (Signed)
 You can also look for a UREA based cream to help with the dry skin     Choose a moisturizer from  the list below:  For normal skin: Moisturize feet once daily; do not apply between toes CeraVe Daily Moisturizing Lotion Lubriderm Advanced Therapy Lotion or Lubriderm Intense Skin Repair Lotion Aquaphor Intensive Repair Lotion Gold Bond Ultimate Diabetic Foot Lotion Eucerin Intensive Repair Moisturizing Lotion  For extremely dry, cracked feet: moisturize feet once daily; do not apply between toes Bag Balm Skin Moisturizer (green tin can) CeraVe Healing Ointment Eucerin Aquaphor Advanced Repair Cream Vaseline Petroleum Healing Jelly   If you have problems reaching your feet: apply to feet once daily; do not apply between toes Aquaphor Advanced Therapy Ointment Body Spray  Vaseline Intensive Care Spray Moisturizer (Unscented,  [place] Radiant Spray or Aloe Smooth Spray)

## 2024-09-05 DIAGNOSIS — F909 Attention-deficit hyperactivity disorder, unspecified type: Secondary | ICD-10-CM | POA: Diagnosis not present

## 2024-09-05 DIAGNOSIS — Z1331 Encounter for screening for depression: Secondary | ICD-10-CM | POA: Diagnosis not present

## 2024-09-05 DIAGNOSIS — G629 Polyneuropathy, unspecified: Secondary | ICD-10-CM | POA: Diagnosis not present

## 2024-09-05 DIAGNOSIS — M1990 Unspecified osteoarthritis, unspecified site: Secondary | ICD-10-CM | POA: Diagnosis not present

## 2024-09-05 DIAGNOSIS — Z0000 Encounter for general adult medical examination without abnormal findings: Secondary | ICD-10-CM | POA: Diagnosis not present

## 2024-09-05 DIAGNOSIS — Z1339 Encounter for screening examination for other mental health and behavioral disorders: Secondary | ICD-10-CM | POA: Diagnosis not present

## 2024-09-05 DIAGNOSIS — R82998 Other abnormal findings in urine: Secondary | ICD-10-CM | POA: Diagnosis not present

## 2024-09-05 DIAGNOSIS — K219 Gastro-esophageal reflux disease without esophagitis: Secondary | ICD-10-CM | POA: Diagnosis not present

## 2024-09-05 DIAGNOSIS — E785 Hyperlipidemia, unspecified: Secondary | ICD-10-CM | POA: Diagnosis not present

## 2024-09-05 DIAGNOSIS — I10 Essential (primary) hypertension: Secondary | ICD-10-CM | POA: Diagnosis not present

## 2024-09-05 DIAGNOSIS — E039 Hypothyroidism, unspecified: Secondary | ICD-10-CM | POA: Diagnosis not present

## 2024-09-05 DIAGNOSIS — F419 Anxiety disorder, unspecified: Secondary | ICD-10-CM | POA: Diagnosis not present

## 2024-09-05 DIAGNOSIS — M25561 Pain in right knee: Secondary | ICD-10-CM | POA: Diagnosis not present

## 2024-09-05 DIAGNOSIS — Z23 Encounter for immunization: Secondary | ICD-10-CM | POA: Diagnosis not present

## 2024-09-05 DIAGNOSIS — F1010 Alcohol abuse, uncomplicated: Secondary | ICD-10-CM | POA: Diagnosis not present

## 2024-09-05 DIAGNOSIS — G610 Guillain-Barre syndrome: Secondary | ICD-10-CM | POA: Diagnosis not present

## 2024-09-09 DIAGNOSIS — J301 Allergic rhinitis due to pollen: Secondary | ICD-10-CM | POA: Diagnosis not present

## 2024-09-09 DIAGNOSIS — J3081 Allergic rhinitis due to animal (cat) (dog) hair and dander: Secondary | ICD-10-CM | POA: Diagnosis not present

## 2024-09-09 DIAGNOSIS — J3089 Other allergic rhinitis: Secondary | ICD-10-CM | POA: Diagnosis not present

## 2024-09-15 DIAGNOSIS — F900 Attention-deficit hyperactivity disorder, predominantly inattentive type: Secondary | ICD-10-CM | POA: Diagnosis not present

## 2024-09-23 ENCOUNTER — Ambulatory Visit: Admitting: Neurology

## 2024-09-23 ENCOUNTER — Encounter: Payer: Self-pay | Admitting: Neurology

## 2024-09-23 VITALS — BP 130/73 | HR 70 | Resp 15

## 2024-09-23 DIAGNOSIS — R269 Unspecified abnormalities of gait and mobility: Secondary | ICD-10-CM | POA: Diagnosis not present

## 2024-09-23 DIAGNOSIS — R202 Paresthesia of skin: Secondary | ICD-10-CM

## 2024-09-23 DIAGNOSIS — Z8669 Personal history of other diseases of the nervous system and sense organs: Secondary | ICD-10-CM | POA: Diagnosis not present

## 2024-09-23 DIAGNOSIS — G6289 Other specified polyneuropathies: Secondary | ICD-10-CM

## 2024-09-23 MED ORDER — GABAPENTIN 400 MG PO CAPS: 400.0000 mg | ORAL_CAPSULE | Freq: Three times a day (TID) | ORAL | 3 refills | Status: AC

## 2024-09-23 NOTE — Progress Notes (Signed)
 PATIENT: Brianna Gates DOB: 1951/01/05  REASON FOR VISIT: Follow up for GBS HISTORY FROM: Patient PRIMARY NEUROLOGIST: Dr. Onita since Dr. 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 Showed mildly decreased IgA, globulin of unknown clinical significance. A1C 5.9. - Continue exercise, activity - Follow-up in 1 year or sooner if needed  I personally spent a total of 30 minutes in the care of the patient today including preparing to see the patient, getting/reviewing separately obtained history, performing a medically appropriate exam/evaluation, counseling and educating, placing orders, referring and communicating with other health care professionals, documenting clinical information in the EHR, independently interpreting results, and communicating results.   HISTORY OF PRESENT ILLNESS: Today 09/23/24 09/23/24 SS: Extensive lab evaluation : RPR, B 12, CPR TSH, CK, sed rate, A1C, ANA, MM panel, Vit D. Showed mildly decreased IgA, globulin of unknown clinical significance. A1C 5.9. EMG nerve conduction study only showed mild peripheral neuropathy, with no apparent demyelinating features, moderate right carpal tunnel syndromes. Added Cymbalta  30 mg, psych increased 90 mg once daily. Gabapentin  400 mg TID. Continues with pins and needles to shin level. Psych started armodafinil for focus. Pleased with current regimen.   01/30/24 Dr. Onita: Patient was diagnosed with Guillain-Barr syndrome many years ago, presenting with subacute onset ascending paresthesia, gait abnormality, to the point  she was bedridden, require repeat IVIG, prolonged rehabilitation, more than 6 months later, she was able to ambulate again   But she had residual mild unsteady gait, lower extremity paresthesia, I was able to find previous record by Dr. Jenel since April 2014, initial examination document steady gait, good motor strength with mildly decreased deep tendon reflex,   Over the years she has been followed by our clinic for her complaints of mild unsteady gait, lower extremity paresthesia, titrating dose of gabapentin , currently taking 400 mg 3 times a day   She remain active, exercise regularly, better over the past couple years, she noticed increased lower extremity paresthesia described as numbness, stinging sensation, more of a nuisance, denies significant pain, to have the urge to stretch her leg sometimes pacing around when she sits still for extended period of time   In addition she suffered anxiety, on BuSpar 20 mg twice a day with suboptimal control   She also had left shoulder surgery, shoulder pain, multiple bilateral knee surgery, left foot surgery   EMG nerve conduction study January 30, 2024 showed mild sensorimotor polyneuropathy, 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  In addition there is evidence of moderate right carpal tunnel syndromes  11/13/23 SS: We tried to increase gabapentin  to 400 mg TID, reported side effect of blood pressure/palpations, we lowered back to 300 mg TID. Has numbness to lower extremities, sometimes feet burn. Walking barefoot on hard surfaces  is uncomfortable. Worries about her paresthesia, neuropathy symptoms worsening over time. Continues to exercise, balance class. Atrophy of left calf.   05/08/23 SS: Feels some paresthesia to her feet is going higher to ankle level. Right hand getting weaker, harder to open cans, fatigues with use. Does water  aerobics, group fitness. Takes gabapentin  300 mg  TID, would like to try more, notes more tingling in her  extremities that is bothersome. At night her feet burn, but she is cold, can be bright red. She worries about increased paresthesia, wants to be active with her grandkids and keep up with friends. Remains committed to her sobriety.   Update  10/18/22 SS: wants to discuss getting back on gabapentin . Tried Cymbalta . When on gabapentin  before when drinking alcohol , made her feel drunk. Tried higher dose Cymbalta  didn't help. She does go to [employer]. Is no longer drinking. Still has numbness to feet, tingling all day. Worse on hardwood floors. Doesn't keep her up at night, has never been a good sleeper. Feels sand between her toes. Walking is better with exercise, had right knee replacement, has scar tissue, is now stiff. Has stopped drink etoh since summer. Seeing psychiatry, for drinking mostly.   Update 10/06/21 SS: Brianna Gates here today for follow-up with history of Guillain Barr syndrome.  Is on Cymbalta  for uncomfortable sensory alterations in the feet. Gabapentin  made her feel drowsy. Taking Cymbalta  60 mg daily. Mostly feels like sand in between toes, ball of foot. Can't walk barefoot. No falls. The sensation isn't painful. Her right hand can tingle with overuse. Wants to stay on Cymbalta  for now. Is going to see a therapist to see about ADD. If they put her on a mood medication, she may go back to gabapentin . She does water  aerobics. Here alone.   HISTORY  04/07/2021 Dr. Jenel: Brianna Gates is a 73 year old right-handed white female with a history of Guillain-Barre syndrome in the past.  The patient has had some residual numbness and tingling in the feet, she has some mild balance issues but she has not had any falls.  She recently had a redo of a knee replacement on the right, she has done well with recovery.  She recently had a COVID infection in January 2022.  The patient has recovered from this.  She has been on gabapentin  for some of the discomfort in the feet, but she has had a lot of daytime drowsiness  and some gait instability in the evening when she took the medication.  She has stopped the medication at this point.  She is on Cymbalta  taking 20 mg daily but she does not believe this helps her symptoms.  REVIEW OF SYSTEMS: Out of a complete 14 system review of symptoms, the patient complains only of the following symptoms, and all other reviewed systems are negative.  See HPI  ALLERGIES: Allergies  Allergen Reactions   Captopril Other (See Comments)    reaction   Oxaprozin Hives, Other (See Comments) and Dermatitis   Ramipril Cough and Other (See Comments)   Sertraline Hcl Diarrhea    Other Reaction(s): GI issues   Ace Inhibitors     Other reaction(s): Other (See Comments)  GB syndrome  Other Reaction(s): Cough   Buspirone Hcl     Other Reaction(s): Intolerant, GI issues   Doxycycline Other (See Comments)    Burning sensation/Rash  Other Reaction(s): Blisters, burning, Unknown   Influenza Vac Split Quad     Other Reaction(s): Guillain-Barr syndrome   Influenza Vaccines     HX    Sulfa Antibiotics Other (See Comments)    unknown    HOME MEDICATIONS: Outpatient Medications Prior to Visit  Medication Sig Dispense Refill   acetaminophen  (TYLENOL ) 650 MG CR tablet Take 1,300 mg by mouth every 8 (eight) hours as needed for pain or fever.  albuterol  (PROVENTIL  HFA;VENTOLIN  HFA) 108 (90 BASE) MCG/ACT inhaler Inhale 2 puffs into the lungs every 6 (six) hours as needed for wheezing or shortness of breath.     amLODipine  (NORVASC ) 2.5 MG tablet Take 1 tablet (2.5 mg total) by mouth every evening. 90 tablet 3   amoxicillin (AMOXIL) 500 MG capsule Take 2,000 mg by mouth once as needed (prior to dental appointments).     Armodafinil 200 MG TABS Take 1 tablet by mouth every morning.     Ascorbic Acid (VITAMIN C PO) Take 1 tablet by mouth as needed (sick symptoms).     aspirin  EC 81 MG tablet Take 81 mg by mouth daily. Swallow whole.     azelastine (ASTELIN) 0.1 % nasal spray  Place 1 spray into both nostrils 2 (two) times daily as needed (cold symptoms).     busPIRone (BUSPAR) 30 MG tablet Take 30 mg by mouth 2 (two) times daily.     Cholecalciferol (VITAMIN D ) 2000 UNITS CAPS Take 2,000 Units by mouth daily in the afternoon.     DULoxetine  (CYMBALTA ) 30 MG capsule Take 1 capsule (30 mg total) by mouth daily. 30 capsule 11   Emollient (CETAPHIL) cream Apply 1 application topically daily.     EPINEPHrine  0.3 mg/0.3 mL IJ SOAJ injection Inject 0.3 mg into the muscle once as needed for anaphylaxis.     estradiol  (ESTRACE ) 0.1 MG/GM vaginal cream Place 1 Applicatorful vaginally 2 (two) times a week.     fexofenadine-pseudoephedrine (ALLEGRA-D 24) 180-240 MG 24 hr tablet Take 1 tablet by mouth daily.     gabapentin  (NEURONTIN ) 400 MG capsule Take 1 capsule (400 mg total) by mouth 3 (three) times daily. 90 capsule 11   ibuprofen (ADVIL,MOTRIN) 100 MG tablet Take 400 mg by mouth daily at 12 noon. May take in the pm additionally as needed     ketoconazole  (NIZORAL ) 2 % cream Apply 1 application topically daily. 60 g 0   levothyroxine  (SYNTHROID ) 50 MCG tablet Take 50 mcg by mouth daily before breakfast.      losartan  (COZAAR ) 50 MG tablet Take 1 tablet (50 mg total) by mouth in the morning. 90 tablet 3   montelukast  (SINGULAIR ) 10 MG tablet Take 10 mg by mouth at bedtime.     oxyCODONE  (ROXICODONE ) 5 MG immediate release tablet Take 1 tablet (5 mg total) by mouth every 4 (four) hours as needed for severe pain (pain score 7-10) or breakthrough pain. 30 tablet 0   pantoprazole  (PROTONIX ) 40 MG tablet Take 1 tablet (40 mg total) by mouth 2 (two) times daily. Office visit for further refills 180 tablet 0   rosuvastatin  (CRESTOR ) 10 MG tablet Take 1 tablet (10 mg total) by mouth every other day. 90 tablet 3   Simethicone (PHAZYME PO) Take 250 mg by mouth daily as needed (Gas).     triamcinolone  cream (KENALOG ) 0.1 % 1 application as needed (itching bumps on stomach). Mixed with  Cetaphil     VEVYE 0.1 % SOLN Apply 1 drop to eye 2 (two) times daily.     aspirin  EC 325 MG tablet Take 1 tablet (325 mg total) by mouth daily. (Patient not taking: Reported on 09/01/2024) 14 tablet 0   COVID-19 mRNA vaccine 2023-2024 (COMIRNATY ) SUSP injection Inject into the muscle. 0.3 mL 0   COVID-19 mRNA vaccine, Pfizer, (COMIRNATY ) syringe Inject into the muscle. 0.3 mL 0   No facility-administered medications prior to visit.    PAST MEDICAL HISTORY: Past Medical History:  Diagnosis Date   Allergy    Anxiety    Asthma    INFREQUENT PROBLEM - rarely used inhaler   CAD (coronary artery disease) 06/29/2023   CCTA 06/27/23: CAC score 15.7 (45th percentile), mild nonobstructive CAD [mid LAD 1-24, proximal LCx 1-24]    Cervical spondylosis    Cervical strain 07/22/2014   Chest pain of uncertain etiology 12/13/2021   Degenerative arthritis    Dyslipidemia    Eczema    GERD (gastroesophageal reflux disease)    Guillain-Barre syndrome    History of Guillain-Barre syndrome 07/22/2014   Hyperlipidemia    Hypertension    Hypothyroidism    Lumbosacral spondylosis    Palpitations 12/12/2021   Pure hypercholesterolemia 12/13/2021   Raynaud disease    Thyroid  nodule     PAST SURGICAL HISTORY: Past Surgical History:  Procedure Laterality Date   ABDOMINAL HYSTERECTOMY     COLONOSCOPY     ESOPHAGOGASTRODUODENOSCOPY (EGD) WITH PROPOFOL  N/A 09/13/2021   Procedure: ESOPHAGOGASTRODUODENOSCOPY (EGD) WITH PROPOFOL ;  Surgeon: Abran Norleen SAILOR, MD;  Location: [facility];  Service: Endoscopy;  Laterality: N/A;   FOOT SURGERY Left    GANGLION CYST EXCISION Left    Wrist   HERNIA REPAIR     KNEE ARTHROSCOPY     RT KNEE   KNEE ARTHROTOMY Right 08/27/2013   Procedure: RIGHT KNEE SCAR EXCISION AND FEMORAL REVISION;  Surgeon: Dempsey LULLA Moan, MD;  Location: [facility];  Service: Orthopedics;  Laterality: Right;   REPLACEMENT TOTAL KNEE Right    2014, 2021   REPLACEMENT TOTAL KNEE BILATERAL  Bilateral    SHOULDER ARTHROSCOPY WITH DISTAL CLAVICLE RESECTION Left 12/18/2023   Procedure: LEFT SHOULDER ARTHROSCOPY WITH EXTENSIVE DEBRIDEMENT, DISTAL CLAVICLE RESECTION / ACROMIOPLASTY;  Surgeon: Genelle Standing, MD;  Location: [facility];  Service: Orthopedics;  Laterality: Left;   STRABISMUS SURGERY Right    TONSILLECTOMY     vaginal vulvo prolapse  09/16/2015    FAMILY HISTORY: Family History  Problem Relation Age of Onset   Hypertension Mother    Other Mother        Dyslipidemia   Stroke Mother    Heart attack Father        49s   Heart disease Father    Hemochromatosis Father    Hemochromatosis Brother    Hyperlipidemia Brother    Hypertension Brother    Other Brother        Dyslipidemia   Stomach cancer Paternal Uncle    Colon cancer Neg Hx    Rectal cancer Neg Hx    Esophageal cancer Neg Hx     SOCIAL HISTORY: Social History   Socioeconomic History   Marital status: Married    Spouse name: Not on file   Number of children: 2   Years of education: college   Highest education level: Not on file  Occupational History    Employer: UNEMPLOYED  Tobacco Use   Smoking status: Never   Smokeless tobacco: Never  Vaping Use   Vaping status: Never Used  Substance and Sexual Activity   Alcohol  use: Not Currently    Comment: stopped drinking   Drug use: No   Sexual activity: Yes    Birth control/protection: Other-see comments, Post-menopausal    Comment: Hysterectomy  Other Topics Concern   Not on file  Social History Narrative   Lives at home, married   Patient is right handed.   Patient drinks 2-3 cups caffeine daily.   Social Drivers of Health  Financial Resource Strain: Not on file  Food Insecurity: Not on file  Transportation Needs: Not on file  Physical Activity: Not on file  Stress: Not on file  Social Connections: Not on file  Intimate Partner Violence: Not on file   PHYSICAL EXAM  Vitals:   09/23/24 1457  BP: 130/73  Pulse:  70  Resp: 15  SpO2: 94%    There is no height or weight on file to calculate BMI.  Generalized: Well developed, in no acute distress  Neurological examination  Mentation: Alert oriented to time, place, history taking. Follows all commands speech and language fluent Cranial nerve II-XII: Pupils were equal round reactive to light. Extraocular movements were full, visual field were full on confrontational test. Facial sensation and strength were normal. Head turning and shoulder shrug were normal and symmetric. Motor: The motor testing reveals 5 over 5 strength of all 4 extremities. Good symmetric motor tone is noted throughout.  Atrophy to left lower leg, calf Sensory: Decreased soft touch sensation to mid shin level bilaterally Coordination: Cerebellar testing reveals good finger-nose-finger and heel-to-shin bilaterally.  Gait and station: Gait is normal, steady, tandem gait is steady walking forward and backward  Reflexes: Deep tendon reflexes are symmetric and normal  DIAGNOSTIC DATA (LABS, IMAGING, TESTING) - I reviewed patient records, labs, notes, testing and imaging myself where available.  Lab Results  Component Value Date   WBC 6.2 01/30/2024   HGB 14.3 01/30/2024   HCT 42.7 01/30/2024   MCV 99 (H) 01/30/2024   PLT 176 01/30/2024      Component Value Date/Time   NA 146 (H) 01/30/2024 1324   K 4.1 01/30/2024 1324   CL 106 01/30/2024 1324   CO2 26 01/30/2024 1324   GLUCOSE 90 01/30/2024 1324   GLUCOSE 100 (H) 10/30/2021 0808   BUN 20 01/30/2024 1324   CREATININE 0.81 01/30/2024 1324   CALCIUM  10.1 01/30/2024 1324   PROT 6.7 01/30/2024 1324   ALBUMIN 4.7 01/30/2024 1324   AST 26 01/30/2024 1324   ALT 19 01/30/2024 1324   ALKPHOS 97 01/30/2024 1324   BILITOT 0.3 01/30/2024 1324   GFRNONAA >60 10/30/2021 0808   GFRAA >90 08/29/2013 0405   Lab Results  Component Value Date   CHOL 156 09/14/2022   HDL 62 09/14/2022   LDLCALC 80 09/14/2022   TRIG 70 09/14/2022    CHOLHDL 2.5 09/14/2022   Lab Results  Component Value Date   HGBA1C 5.9 (H) 01/30/2024   Lab Results  Component Value Date   VITAMINB12 516 01/30/2024   Lab Results  Component Value Date   TSH 0.660 01/30/2024   Lauraine Born, AGNP-C, DNP 09/23/2024, 3:04 PM [facility] [address] [place], [place] [address] [phone]

## 2024-09-23 NOTE — Patient Instructions (Signed)
 Great to see you today! Continue current dose of gabapentin  Continue exercise, activity Call for worsening symptoms Follow-up in 1 year.  Thanks!!

## 2024-09-25 DIAGNOSIS — J3089 Other allergic rhinitis: Secondary | ICD-10-CM | POA: Diagnosis not present

## 2024-09-25 DIAGNOSIS — J3081 Allergic rhinitis due to animal (cat) (dog) hair and dander: Secondary | ICD-10-CM | POA: Diagnosis not present

## 2024-09-25 DIAGNOSIS — J301 Allergic rhinitis due to pollen: Secondary | ICD-10-CM | POA: Diagnosis not present

## 2024-09-29 DIAGNOSIS — S81811A Laceration without foreign body, right lower leg, initial encounter: Secondary | ICD-10-CM | POA: Diagnosis not present

## 2024-09-29 DIAGNOSIS — Z23 Encounter for immunization: Secondary | ICD-10-CM | POA: Diagnosis not present

## 2024-10-06 ENCOUNTER — Ambulatory Visit (HOSPITAL_BASED_OUTPATIENT_CLINIC_OR_DEPARTMENT_OTHER): Admitting: Physical Therapy

## 2024-10-06 DIAGNOSIS — R2689 Other abnormalities of gait and mobility: Secondary | ICD-10-CM | POA: Insufficient documentation

## 2024-10-06 DIAGNOSIS — I69398 Other sequelae of cerebral infarction: Secondary | ICD-10-CM | POA: Insufficient documentation

## 2024-10-06 NOTE — Therapy (Deleted)
 " OUTPATIENT PHYSICAL THERAPY THORACOLUMBAR EVALUATION   Patient Name: Brianna Gates MRN: [id number] DOB:03-12-1951, 73 y.o., female Today's Date: 10/06/2024  END OF SESSION:   Past Medical History:  Diagnosis Date   Allergy    Anxiety    Asthma    INFREQUENT PROBLEM - rarely used inhaler   CAD (coronary artery disease) 06/29/2023   CCTA 06/27/23: CAC score 15.7 (45th percentile), mild nonobstructive CAD [mid LAD 1-24, proximal LCx 1-24]    Cervical spondylosis    Cervical strain 07/22/2014   Chest pain of uncertain etiology 12/13/2021   Degenerative arthritis    Dyslipidemia    Eczema    GERD (gastroesophageal reflux disease)    Guillain-Barre syndrome    History of Guillain-Barre syndrome 07/22/2014   Hyperlipidemia    Hypertension    Hypothyroidism    Lumbosacral spondylosis    Palpitations 12/12/2021   Pure hypercholesterolemia 12/13/2021   Raynaud disease    Thyroid  nodule    Past Surgical History:  Procedure Laterality Date   ABDOMINAL HYSTERECTOMY     COLONOSCOPY     ESOPHAGOGASTRODUODENOSCOPY (EGD) WITH PROPOFOL  N/A 09/13/2021   Procedure: ESOPHAGOGASTRODUODENOSCOPY (EGD) WITH PROPOFOL ;  Surgeon: Abran Norleen SAILOR, MD;  Location: [facility];  Service: Endoscopy;  Laterality: N/A;   FOOT SURGERY Left    GANGLION CYST EXCISION Left    Wrist   HERNIA REPAIR     KNEE ARTHROSCOPY     RT KNEE   KNEE ARTHROTOMY Right 08/27/2013   Procedure: RIGHT KNEE SCAR EXCISION AND FEMORAL REVISION;  Surgeon: Dempsey LULLA Moan, MD;  Location: [facility];  Service: Orthopedics;  Laterality: Right;   REPLACEMENT TOTAL KNEE Right    2014, 2021   REPLACEMENT TOTAL KNEE BILATERAL Bilateral    SHOULDER ARTHROSCOPY WITH DISTAL CLAVICLE RESECTION Left 12/18/2023   Procedure: LEFT SHOULDER ARTHROSCOPY WITH EXTENSIVE DEBRIDEMENT, DISTAL CLAVICLE RESECTION / ACROMIOPLASTY;  Surgeon: Genelle Standing, MD;  Location: [facility];  Service: Orthopedics;  Laterality: Left;    STRABISMUS SURGERY Right    TONSILLECTOMY     vaginal vulvo prolapse  09/16/2015   Patient Active Problem List   Diagnosis Date Noted   Anxiety 01/30/2024   Peripheral neuropathy 01/30/2024   Tendinitis of left rotator cuff 12/18/2023   Arthritis of left acromioclavicular joint 12/18/2023   CAD (coronary artery disease) 06/29/2023   Paresthesia 10/18/2022   Onychomycosis 01/29/2022   Chest pain of uncertain etiology 12/13/2021   Pure hypercholesterolemia 12/13/2021   Palpitations 12/12/2021   Esophageal dysphagia    Allergic rhinitis 04/04/2021   Allergic rhinitis due to animal (cat) (dog) hair and dander 04/04/2021   Allergic rhinitis due to pollen 04/04/2021   Hormone deficiency 04/04/2021   Syncope and collapse 12/27/2020   Hypotension 12/27/2020   Bradycardia 12/27/2020   History of COVID-19 12/27/2020   Preoperative evaluation to rule out surgical contraindication 07/16/2020   Arthrofibrosis of knee joint, right 11/26/2019   Arthritis of knee 07/30/2019   Presence of right artificial knee joint 07/30/2019   Sleep apnea 10/16/2018   Shoulder impingement syndrome 01/15/2018   Gastroesophageal reflux disease 02/02/2016   Sensorineural hearing loss (SNHL), bilateral 02/02/2016   Vaginal vault prolapse after hysterectomy 07/28/2015   History of Guillain-Barre syndrome 07/22/2014   Cervical strain 07/22/2014   Postoperative stiffness of total knee replacement 08/27/2013   Abnormality of gait 08/22/2012   Cervical spondylosis without myelopathy 08/22/2012   Muscle weakness (generalized) 08/22/2012   Guillain-Barre syndrome 08/22/2012   Essential hypertension 08/22/2012  Strabismus 08/22/2012   Degenerative joint disease of knee, left 08/22/2012   Hypothyroidism 08/22/2012   Raynaud's disease 08/22/2012   Knee joint replacement by other means 08/22/2012    PCP: Dr Janey  REFERRING PROVIDER: Lorilee Sven SQUIBB, MD   REFERRING DIAG: [phone] (ICD-10-CM) -  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  ONSET DATE: ***  SUBJECTIVE:                                                                                                                                                                                           SUBJECTIVE STATEMENT: ***  PERTINENT HISTORY:  HTN, GERD, Gullian- Barre syndrome.  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 Yes: NPRS scale: *** Pain location: neck Pain description: burning and aching, tingling Aggravating factors: bending; walking > 20 minutes Relieving factors: not much  PRECAUTIONS: {Therapy precautions:24002}   WEIGHT BEARING RESTRICTIONS: No  FALLS:  Has patient fallen in last 6 months? {fallsyesno:27318}  LIVING ENVIRONMENT: Lives with: lives with their spouse Lives in: House/apartment Stairs: Yes: Internal: 1 flight steps; on right going up and External: 4-5 steps; bilateral but cannot reach both Has following equipment at home: shower chair and Grab bars   OCCUPATION: Retired    PLOF: Independent  PATIENT GOALS: ***  NEXT MD VISIT: ***  OBJECTIVE:  Note: Objective measures were completed at Evaluation unless otherwise noted.  DIAGNOSTIC FINDINGS:  MRI c-spine 03/29/24 MPRESSION: 1. Mild for age multilevel cervical spondylosis without significant spinal stenosis or overt neural impingement. 2. Multifactorial degenerative changes with resultant multilevel foraminal narrowing as above. Notable findings include mild bilateral C4 and C5 foraminal stenosis, moderate left C6 foraminal narrowing, with mild left C7 foraminal stenosis. 3. Mild-to-moderate multilevel facet hypertrophy as above. Finding could contribute to neck pain.  PATIENT SURVEYS:   ABC  COGNITION: Overall cognitive status: Within functional limits for tasks assessed     SENSATION: Decreased sensation feet due to peripheral neuropathies   POSTURE: {posture:25561}  PALPATION: ***  LUMBAR ROM:   AROM eval  Flexion   Extension   Right lateral flexion   Left lateral flexion   Right rotation   Left rotation    (Blank rows = not tested)  LOWER EXTREMITY ROM:     {AROM/PROM:27142}  Right eval Left eval  Hip flexion    Hip extension    Hip abduction    Hip adduction    Hip internal rotation  Hip external rotation    Knee flexion    Knee extension    Ankle dorsiflexion    Ankle plantarflexion    Ankle inversion    Ankle eversion     (Blank rows = not tested)  LOWER EXTREMITY MMT:    MMT Right eval Left eval  Hip flexion    Hip extension    Hip abduction    Hip adduction    Hip internal rotation    Hip external rotation    Knee flexion    Knee extension    Ankle dorsiflexion    Ankle plantarflexion    Ankle inversion    Ankle eversion     (Blank rows = not tested)   FUNCTIONAL TESTS:  Timed up and go (TUG): ***   Item Test date: *** Date:  Date:   Sitting to standing {berg sit to stand:32780} Insert SmartPhrase OPRCBERGREEVAL Insert SmartPhrase OPRCBERGREEVAL  2. Standing unsupported {berg stand unsupported:32781}    3. Sitting with back unsupported, feet supported {berg sit unsupported:32782}    4. Standing to sitting {berg stand to sit:32783}    5. Pivot transfer  {berg transfers:32784}    6. Standing unsupported with eyes closed {berg stand unsupported EC:32785}    7. Standing unsupported with feet together {berg stand unsupported feet together:32786}    8. Reaching forward with outstretched arms while standing {berg reaching:32787}    9. Pick up object from the floor from standing {berg object from floor:32788}    10. Turning to look behind over left and right shoulders while standing {bern turn to look:32789}    11.  Turn 360 degrees {berg turn 360:32790}    12. Place alternate foot on step or stool while standing unsupported {berg alternating foot:32791}    13. Standing unsupported one foot in front {berg tandem:32792}    14. Standing on one leg {berg SLS:32793}     Total Score ***/56 Total Score:    Total Score:     GAIT: Distance walked: *** Assistive device utilized: {Assistive devices:23999} Level of assistance: {Levels of assistance:24026} Comments: ***  TREATMENT                                                                                                                              Eval Self care:Posture and optometrist instruction    PATIENT EDUCATION:  Education details: Discussed eval findings, rehab rationale, aquatic program progression/POC and pools in area. Patient is in agreement  Person educated: Patient Education method: Explanation Education comprehension: verbalized understanding  HOME EXERCISE PROGRAM: Has land based HEP from previous episode Aquatic TBA  ASSESSMENT:  CLINICAL IMPRESSION: Patient is a 73 y.o. f who was seen today for physical therapy evaluation and treatment for impaired balance. She is well known to this clinic as she was seen for an episode earlier this year for a shoulder surgery. She presents today with complaints of decreased balance.      Any falls??  She does have chronic peripheral neuropathies presenting as numbness in bilateral feet due to Gullian- Barre syndrome in past which effects her balance.  OBJECTIVE IMPAIRMENTS: {opptimpairments:25111}.   ACTIVITY LIMITATIONS: {activitylimitations:27494}  PARTICIPATION LIMITATIONS: {participationrestrictions:25113}  PERSONAL FACTORS: {Personal factors:25162} are also affecting patient's functional outcome.   REHAB POTENTIAL: Fair ***  CLINICAL DECISION MAKING: {clinical decision making:25114}  EVALUATION COMPLEXITY: {Evaluation complexity:25115}   GOALS: Goals reviewed with patient?  Yes  SHORT TERM GOALS: Target date: ***  Pt will tolerate full aquatic sessions consistently without increase in pain and with improving function to demonstrate good toleration and effectiveness of intervention.  Baseline: Goal status: INITIAL  2.  *** Baseline:  Goal status: INITIAL  3.  *** Baseline:  Goal status: INITIAL  4.  *** Baseline:  Goal status: INITIAL  5.  *** Baseline:  Goal status: INITIAL  6.  *** Baseline:  Goal status: INITIAL  LONG TERM GOALS: Target date: ***  Pt will improve ABC score to at least   to demonstrate improved confidence with balance and ambulation and dec risk for falls. (MDIC = 19%) Baseline:  Goal status: INITIAL  2.  Pt will improve on Berg balance test to >/= /56 to demonstrate a decrease in fall risk. Baseline:  Goal status: INITIAL  3.  *** Baseline:  Goal status: INITIAL  4.  *** Baseline:  Goal status: INITIAL  5.  *** Baseline:  Goal status: INITIAL  6.  *** Baseline:  Goal status: INITIAL  PLAN:  PT FREQUENCY: {rehab frequency:25116}  PT DURATION: {rehab duration:25117}  PLANNED INTERVENTIONS: 97164- PT Re-evaluation, 97750- Physical Performance Testing, 97110-Therapeutic exercises, 97530- Therapeutic activity, 97112- Neuromuscular re-education, 97535- Self Care, 02859- Manual therapy, [id number]- Gait training, [id number]- Aquatic Therapy, [phone] (1-2 muscles), 20561 (3+ muscles)- Dry Needling, Patient/Family education, Balance training, Stair training, Taping, Joint mobilization, DME instructions, Cryotherapy, and Moist heat.  PLAN FOR NEXT SESSION: PIERRETTE [place]) Sumie Remsen MPT 10/06/2024 6:43 AM [facility] [address] [place], [place], [address] Phone: [phone]   Fax:  [phone]   "

## 2024-10-06 NOTE — Therapy (Signed)
 Pt arrives with reporting uncertainty of benefit of aquatic therapy for balance dysfunction as she attends a balance class 3 x a week at the [facility] and on the opposite 2 days a week she engages in an exercise class. She also reports tripping on a step the other day causing a very large skin tear on her Rt shin which she received medical care for at an urgent care.  She has it wrapped up and is hesitant about getting into a pool.  After conversing with pt she decided to wait and see Dr Lorilee early Jan and discuss with her the need to proceed with the aquatic therapy as well as to have her check her wound and clear her for getting into pool. Pt is scheduled to return 10/27/24 if they decide to proceed with aquatic PT.  Ronal [place]) Kenzlei Runions MPT 10/06/2024 4:01 PM [facility] [address] [place], [place], [address] Phone: [phone]   Fax:  [phone]

## 2024-10-13 ENCOUNTER — Ambulatory Visit (HOSPITAL_BASED_OUTPATIENT_CLINIC_OR_DEPARTMENT_OTHER): Payer: Self-pay | Admitting: Physical Therapy

## 2024-10-20 ENCOUNTER — Ambulatory Visit (HOSPITAL_BASED_OUTPATIENT_CLINIC_OR_DEPARTMENT_OTHER): Payer: Self-pay | Admitting: Physical Therapy

## 2024-10-21 ENCOUNTER — Encounter: Attending: Physical Medicine and Rehabilitation | Admitting: Physical Medicine and Rehabilitation

## 2024-10-21 VITALS — BP 134/74 | HR 77 | Ht 65.0 in | Wt 119.0 lb

## 2024-10-21 DIAGNOSIS — M7918 Myalgia, other site: Secondary | ICD-10-CM | POA: Insufficient documentation

## 2024-10-21 MED ORDER — LIDOCAINE HCL 1 % IJ SOLN
3.0000 mL | Freq: Once | INTRAMUSCULAR | Status: AC
  Administered 2024-10-21: 3 mL

## 2024-10-21 NOTE — Addendum Note (Signed)
 Addended by: JAMA MUSLIM T on: 10/21/2024 03:46 PM   Modules accepted: Orders

## 2024-10-21 NOTE — Progress Notes (Signed)

## 2024-10-27 ENCOUNTER — Ambulatory Visit (HOSPITAL_BASED_OUTPATIENT_CLINIC_OR_DEPARTMENT_OTHER): Payer: Self-pay | Admitting: Physical Therapy

## 2024-10-27 ENCOUNTER — Other Ambulatory Visit: Payer: Self-pay | Admitting: Internal Medicine

## 2024-11-06 ENCOUNTER — Telehealth: Payer: Self-pay | Admitting: Internal Medicine

## 2024-11-06 ENCOUNTER — Ambulatory Visit: Admitting: Podiatry

## 2024-11-06 NOTE — Telephone Encounter (Signed)
 Patient last seen 10/2023 by Dr.Ross started on Losartan  50 mg am and Amlodipine  2.5 mg hs  PCP added Losartan  25 mg hs dose to her already 50 mg q am dose, 5-6 months ago she tells me. She will also follow up with him regarding medication management.   Please advise. When is patient due for follow up?

## 2024-11-06 NOTE — Telephone Encounter (Signed)
 Pt c/o BP issue: STAT if pt c/o blurred vision, one-sided weakness or slurred speech.  STAT if BP is GREATER than 180/120 TODAY.  STAT if BP is LESS than 90/60 and SYMPTOMATIC TODAY  1. What is your BP concern? Up and down  2. Have you taken any BP medication today?yes  3. What are your last 5 BP readings?  167/75  141/60   4. Are you having any other symptoms (ex. Dizziness, headache, blurred vision, passed out)?   No    Pt wants to know if there are any adjustments she can make to her medication. Please advise.

## 2024-11-07 ENCOUNTER — Ambulatory Visit: Admitting: Podiatry

## 2024-11-07 DIAGNOSIS — M79674 Pain in right toe(s): Secondary | ICD-10-CM | POA: Diagnosis not present

## 2024-11-07 DIAGNOSIS — M79675 Pain in left toe(s): Secondary | ICD-10-CM | POA: Diagnosis not present

## 2024-11-07 DIAGNOSIS — B351 Tinea unguium: Secondary | ICD-10-CM

## 2024-11-07 NOTE — Progress Notes (Signed)
 Subjective: Chief Complaint  Patient presents with   RFC    Non diabetic patient presents to office today RFC and Nail trim. Denies any changes since LMV.     74 year old female presents the office today for follow-up evaluation of ingrown toenail on both big toes.  She said they do cause discomfort.  She also recently had a fall and developed a wound in the right leg she is not able to get pedicures and she is asked for the other nails to be trimmed today as they are also thick and elongated and cause discomfort.  No swelling, redness or drainage.  No open lesions.   Objective: AAO x3, NAD DP/PT pulses palpable bilaterally, CRT less than 3 seconds Nails are hypertrophic, dystrophic, brittle, discolored, elongated 10.  Incurvation present to hallux toenails without any signs of infection.  No surrounding redness or drainage. Tenderness nails 1-5 bilaterally. No open lesions or pre-ulcerative lesions are identified today. No pain with calf compression, swelling, warmth, erythema  Assessment: Ingrown toenail, symptomatic onychomycosis  Plan: -All treatment options discussed with the patient including all alternatives, risks, complications.  -Sharply debrided nails x 10 without any complications or bleeding  Return in about 9 weeks (around 01/09/2025).    Donnice JONELLE Fees DPM

## 2024-11-13 ENCOUNTER — Encounter: Payer: Self-pay | Admitting: Internal Medicine

## 2024-11-13 ENCOUNTER — Ambulatory Visit: Attending: Internal Medicine | Admitting: Internal Medicine

## 2024-11-13 VITALS — BP 119/65 | HR 71 | Ht 64.0 in | Wt 121.4 lb

## 2024-11-13 DIAGNOSIS — I10 Essential (primary) hypertension: Secondary | ICD-10-CM

## 2024-11-13 DIAGNOSIS — E782 Mixed hyperlipidemia: Secondary | ICD-10-CM | POA: Diagnosis not present

## 2024-11-13 MED ORDER — ROSUVASTATIN CALCIUM 10 MG PO TABS: 10.0000 mg | ORAL_TABLET | Freq: Every day | ORAL | 3 refills | Status: AC

## 2024-11-13 MED ORDER — HYDROCHLOROTHIAZIDE 25 MG PO TABS: ORAL_TABLET | ORAL | 3 refills | Status: AC

## 2024-11-13 MED ORDER — LOSARTAN POTASSIUM 50 MG PO TABS: ORAL_TABLET | ORAL | 3 refills | Status: AC

## 2024-11-13 NOTE — Patient Instructions (Signed)
 Medication Instructions:  Increease Crestor  to 10 mg daily Increase Losartan  to 50 mg twice a day  Take Amlodipine  2.5 mg daily as needed Take hydrochlorothiazide  25 mg 1/2 tablet daily if needed Continue all other medications  *If you need a refill on your cardiac medications before your next appointment, please call your pharmacy*  Lab Work: NMR,LPa,A1c,bmet to be done in 2 months If you have labs (blood work) drawn today and your tests are completely normal, you will receive your results only by: MyChart Message (if you have MyChart) OR A paper copy in the mail If you have any lab test that is abnormal or we need to change your treatment, we will call you to review the results.  Testing/Procedures: None ordered  Follow-Up: At [facility], you and your health needs are our priority.  As part of our continuing mission to provide you with exceptional heart care, our providers are all part of one team.  This team includes your primary Cardiologist (physician) and Advanced Practice Providers or APPs (Physician Assistants and Nurse Practitioners) who all work together to provide you with the care you need, when you need it.  Your next appointment:  6 months    Call in April to schedule July appointment     Provider:  Dr.Ross   We recommend signing up for the patient portal called MyChart.  Sign up information is provided on this After Visit Summary.  MyChart is used to connect with patients for Virtual Visits (Telemedicine).  Patients are able to view lab/test results, encounter notes, upcoming appointments, etc.  Non-urgent messages can be sent to your provider as well.   To learn more about what you can do with MyChart, go to [email or web address].

## 2024-11-13 NOTE — Progress Notes (Signed)
 "   Cardiology Office Note   Date:  11/13/2024   ID:  Brianna Gates, DOB [birth year], MRN [id number]  PCP:  Brianna Santos, MD  Cardiologist:   Brianna Gull, MD   Gates presents for follow up of HTN     History of Present Illness: Brianna Gates is a 74 y.o. female with a history of HTN (labile BP), dizziness,  palpitations, mild nonobstructive, HL, hypothyroidism, Raynauds. Brianna Gates was previously followed by [facility]   Brianna Gates had been on nebivolol and chlorthalidone  and losartan .   Monitor done in 2023 for palpitations showed rare PACs,PVCs  Sept 2024  Echo LVEF normal   Trace MR, trace AI CCTA  Sept 2024  Ca score 15.7  MIld nonobstructive dz    I saw Brianna Gates in Jan 2025   Since seen her BP remains labile   Brianna Gates says amlodipine  2.5 leads to some ankle swelling    Brianna Gates has gone up on losartan  to 50 am and 25 pm   BP still can be high    Followed in psych   Has probable ADHD    Has tried Aromafidll  Did not help much  Stopped in 3 wks    Consideration for Inturniv  Brianna Gates deneis CP   Breathing is OK Has wound on R shin  Wrapped   Needs follow up in wound care     Mild OSA  Uses mouth piece    Current Meds  Medication Sig   acetaminophen  (TYLENOL ) 650 MG CR tablet Take 1,300 mg by mouth every 8 (eight) hours as needed for pain or fever.   albuterol  (PROVENTIL  HFA;VENTOLIN  HFA) 108 (90 BASE) MCG/ACT inhaler Inhale 2 puffs into Brianna lungs every 6 (six) hours as needed for wheezing or shortness of breath.   amLODipine  (NORVASC ) 2.5 MG tablet TAKE ONE TABLET EVERY EVENING   amoxicillin (AMOXIL) 500 MG capsule Take 2,000 mg by mouth once as needed (prior to dental appointments).   Ascorbic Acid (VITAMIN C PO) Take 1 tablet by mouth as needed (sick symptoms).   aspirin  EC 81 MG tablet Take 81 mg by mouth daily. Swallow whole.   azelastine (ASTELIN) 0.1 % nasal spray Place 1 spray into both nostrils 2 (two) times daily as needed (cold symptoms).   busPIRone (BUSPAR) 30 MG tablet Take 30 mg  by mouth 2 (two) times daily.   Cholecalciferol (VITAMIN D ) 2000 UNITS CAPS Take 2,000 Units by mouth daily in Brianna afternoon.   DULoxetine  (CYMBALTA ) 30 MG capsule Take 1 capsule (30 mg total) by mouth daily.   Emollient (CETAPHIL) cream Apply 1 application topically daily.   EPINEPHrine  0.3 mg/0.3 mL IJ SOAJ injection Inject 0.3 mg into Brianna muscle once as needed for anaphylaxis.   estradiol  (ESTRACE ) 0.1 MG/GM vaginal cream Place 1 Applicatorful vaginally 2 (two) times a week.   fexofenadine-pseudoephedrine (ALLEGRA-D 24) 180-240 MG 24 hr tablet Take 1 tablet by mouth daily.   gabapentin  (NEURONTIN ) 400 MG capsule Take 1 capsule (400 mg total) by mouth 3 (three) times daily.   ibuprofen (ADVIL,MOTRIN) 100 MG tablet Take 400 mg by mouth daily at 12 noon. May take in Brianna pm additionally as needed   ketoconazole  (NIZORAL ) 2 % cream Apply 1 application topically daily.   levothyroxine  (SYNTHROID ) 50 MCG tablet Take 50 mcg by mouth daily before breakfast.    losartan  (COZAAR ) 50 MG tablet Take 1 tablet (50 mg total) by mouth in Brianna morning.   montelukast  (SINGULAIR ) 10 MG  tablet Take 10 mg by mouth at bedtime.   pantoprazole  (PROTONIX ) 40 MG tablet Take 1 tablet (40 mg total) by mouth 2 (two) times daily. Office visit for further refills   rosuvastatin  (CRESTOR ) 10 MG tablet Take 1 tablet (10 mg total) by mouth every other day.   Simethicone (PHAZYME PO) Take 250 mg by mouth daily as needed (Gas).   triamcinolone  cream (KENALOG ) 0.1 % 1 application as needed (itching bumps on stomach). Mixed with Cetaphil     Allergies:   Captopril, Oxaprozin, Ramipril, Sertraline hcl, Ace inhibitors, Buspirone hcl, Doxycycline, Influenza vac split quad, Influenza vaccines, and Sulfa antibiotics   Past Medical History:  Diagnosis Date   Allergy    Anxiety    Asthma    INFREQUENT PROBLEM - rarely used inhaler   CAD (coronary artery disease) 06/29/2023   CCTA 06/27/23: CAC score 15.7 (45th percentile), mild  nonobstructive CAD [mid LAD 1-24, proximal LCx 1-24]    Cervical spondylosis    Cervical strain 07/22/2014   Chest pain of uncertain etiology 12/13/2021   Degenerative arthritis    Dyslipidemia    Eczema    GERD (gastroesophageal reflux disease)    Guillain-Barre syndrome    History of Guillain-Barre syndrome 07/22/2014   Hyperlipidemia    Hypertension    Hypothyroidism    Lumbosacral spondylosis    Palpitations 12/12/2021   Pure hypercholesterolemia 12/13/2021   Raynaud disease    Thyroid  nodule     Past Surgical History:  Procedure Laterality Date   ABDOMINAL HYSTERECTOMY     COLONOSCOPY     ESOPHAGOGASTRODUODENOSCOPY (EGD) WITH PROPOFOL  N/A 09/13/2021   Procedure: ESOPHAGOGASTRODUODENOSCOPY (EGD) WITH PROPOFOL ;  Surgeon: Abran Norleen SAILOR, MD;  Location: [facility];  Service: Endoscopy;  Laterality: N/A;   FOOT SURGERY Left    GANGLION CYST EXCISION Left    Wrist   HERNIA REPAIR     KNEE ARTHROSCOPY     RT KNEE   KNEE ARTHROTOMY Right 08/27/2013   Procedure: RIGHT KNEE SCAR EXCISION AND FEMORAL REVISION;  Surgeon: Dempsey LULLA Moan, MD;  Location: [facility];  Service: Orthopedics;  Laterality: Right;   REPLACEMENT TOTAL KNEE Right    2014, 2021   REPLACEMENT TOTAL KNEE BILATERAL Bilateral    SHOULDER ARTHROSCOPY WITH DISTAL CLAVICLE RESECTION Left 12/18/2023   Procedure: LEFT SHOULDER ARTHROSCOPY WITH EXTENSIVE DEBRIDEMENT, DISTAL CLAVICLE RESECTION / ACROMIOPLASTY;  Surgeon: Genelle Standing, MD;  Location: [facility];  Service: Orthopedics;  Laterality: Left;   STRABISMUS SURGERY Right    TONSILLECTOMY     vaginal vulvo prolapse  09/16/2015     Social History:  Brianna Gates  reports that Brianna Gates has never smoked. Brianna Gates has never used smokeless tobacco. Brianna Gates reports that Brianna Gates does not currently use alcohol . Brianna Gates reports that Brianna Gates does not use drugs.   Family History:  Brianna Gates's family history includes Heart attack in her father; Heart disease in her father;  Hemochromatosis in her brother and father; Hyperlipidemia in her brother; Hypertension in her brother and mother; Other in her brother and mother; Stomach cancer in her paternal uncle; Stroke in her mother.    ROS:  Please see Brianna history of present illness. All other systems are reviewed and  Negative to Brianna above problem except as noted.    PHYSICAL EXAM: VS:  BP 119/65 (BP Location: Left Arm, Gates Position: Sitting)   Pulse 71   Ht 5' 4 (1.626 m)   Wt 121 lb 6.4 oz (55.1 kg)  SpO2 97%   BMI 20.84 kg/m   GEN: Well nourished, well developed, in no acute distress  HEENT: normal  Neck: no JVD, carotid bruits Cardiac: RRR; no murmur   Respiratory:  clear to auscultation GI: soft, nontender, no masses  No hepatomegaly  Ext  No LE edema    EKG:  EKG shows NSR 71 bpm   Zio Monitor  Oct 2024  Quality: Fair.  Baseline artifact. Predominant rhythm: sinus rhythm Average heart rate: 64 bpm Max heart rate: 104 bpm in sinus rhythm.  182 in SVT Min heart rate: 44 bpm Pauses >2.5 seconds: none   Rare PACs and PVCs.  5 episodes of SVT lasting up to 14 beats   Gates symptom diary notes multiple episodes of lightheadedness and dizziness at which time sinus rhythm was noted.     Echo  Sept 2024    1. Left ventricular ejection fraction, by estimation, is 60 to 65%. Brianna  left ventricle has normal function. Brianna left ventricle has no regional  wall motion abnormalities. Left ventricular diastolic parameters are  consistent with Grade I diastolic  dysfunction (impaired relaxation). Brianna average left ventricular global  longitudinal strain is -19.3 %. Brianna global longitudinal strain is normal.   2. Right ventricular systolic function is normal. Brianna right ventricular  size is normal.   3. Brianna mitral valve is normal in structure. Trivial mitral valve  regurgitation. No evidence of mitral stenosis.   4. Brianna aortic valve is tricuspid. Aortic valve regurgitation is trivial.  No aortic  stenosis is present.   5. Brianna inferior vena cava is normal in size with greater than 50%  respiratory variability, suggesting right atrial pressure of 3 mmHg.    CCTA  Sept 2024   Pulmonary veins drain normally to Brianna left atrium. No LA appendage thrombus.   Calcium  Score: 15.7 Agatston units.   Coronary Arteries: Right dominant with no anomalies   LM: No plaque or stenosis.   LAD system: Mixed plaque in Brianna mid LAD, mild (1-24%) stenosis.   Circumflex system: Mixed plaque proximal LCx, mild (1-24%) stenosis.   RCA system: No plaque or stenosis.   IMPRESSION: 1. Coronary artery calcium  score 15.7 Agatston units. This places Brianna Gates in Brianna 45th percentile for age and gender, suggesting low risk for future cardiac events.   2.  Mild nonobstructive CAD.   Lipid Panel    Component Value Date/Time   CHOL 156 09/14/2022 0934   TRIG 70 09/14/2022 0934   HDL 62 09/14/2022 0934   CHOLHDL 2.5 09/14/2022 0934   LDLCALC 80 09/14/2022 0934      Wt Readings from Last 3 Encounters:  11/13/24 121 lb 6.4 oz (55.1 kg)  10/21/24 119 lb (54 kg)  09/01/24 121 lb 3.2 oz (55 kg)      ASSESSMENT AND PLAN:  1  HTN  BP continues to be elevated    Will try increasing losartan  to 50 bid    Stop amlodipine  with sl foot edema I have also given Gates Rx of 12.5 hydrochlorothiazide  to start if her BP remains high      I have asked her to write back in MyChart with responsses  2  Palpitaions   Gates denies   3  CAD  Mild CAD on CCTA   No symptoms of angina   4  HL  LDL 94 in APril 2025   I would increase Crestor  to daily   Check labs in 2 months (Lipids and BMET)  5  ADD  Will review with pharmacy  use of Inturniv   May lower BP   Gates worried about lower HR     Current medicines are reviewed at length with Brianna Gates today.  Brianna Gates does not have concerns regarding medicines.  Signed, Brianna Gull, MD  "

## 2024-11-14 ENCOUNTER — Telehealth: Payer: Self-pay | Admitting: Internal Medicine

## 2024-11-14 NOTE — Telephone Encounter (Signed)
 Reviewed use of Intuniv with pharmacy   Felt OK to take     May cause lower BP and HR    Recomm when she starts to monitor BP / HR   If need to adjust other meds we can do that

## 2024-11-14 NOTE — Telephone Encounter (Signed)
 Pt seen in clinic

## 2025-01-01 ENCOUNTER — Encounter: Admitting: Physical Medicine and Rehabilitation

## 2025-01-09 ENCOUNTER — Ambulatory Visit: Admitting: Podiatry

## 2025-09-29 ENCOUNTER — Ambulatory Visit: Admitting: Neurology
